# Patient Record
Sex: Female | Born: 1935 | Race: White | Hispanic: No | Marital: Married | Smoking: Former smoker
Health system: Southern US, Community
[De-identification: ages and names within clinical notes are randomized; demographics above are authoritative.]

## PROBLEM LIST (undated history)

## (undated) DIAGNOSIS — M51369 Other intervertebral disc degeneration, lumbar region without mention of lumbar back pain or lower extremity pain: Secondary | ICD-10-CM

## (undated) DIAGNOSIS — I10 Essential (primary) hypertension: Secondary | ICD-10-CM

## (undated) DIAGNOSIS — K760 Fatty (change of) liver, not elsewhere classified: Secondary | ICD-10-CM

## (undated) DIAGNOSIS — E785 Hyperlipidemia, unspecified: Secondary | ICD-10-CM

## (undated) DIAGNOSIS — M5136 Other intervertebral disc degeneration, lumbar region: Secondary | ICD-10-CM

## (undated) DIAGNOSIS — E119 Type 2 diabetes mellitus without complications: Secondary | ICD-10-CM

## (undated) DIAGNOSIS — E559 Vitamin D deficiency, unspecified: Secondary | ICD-10-CM

## (undated) DIAGNOSIS — N6019 Diffuse cystic mastopathy of unspecified breast: Secondary | ICD-10-CM

## (undated) HISTORY — DX: Other intervertebral disc degeneration, lumbar region without mention of lumbar back pain or lower extremity pain: M51.369

## (undated) HISTORY — PX: MASTECTOMY: SHX3

## (undated) HISTORY — DX: Fatty (change of) liver, not elsewhere classified: K76.0

## (undated) HISTORY — DX: Essential (primary) hypertension: I10

## (undated) HISTORY — DX: Vitamin D deficiency, unspecified: E55.9

## (undated) HISTORY — PX: KNEE ARTHROSCOPY: SHX127

## (undated) HISTORY — DX: Hyperlipidemia, unspecified: E78.5

## (undated) HISTORY — PX: APPENDECTOMY: SHX54

## (undated) HISTORY — DX: Diffuse cystic mastopathy of unspecified breast: N60.19

## (undated) HISTORY — DX: Other intervertebral disc degeneration, lumbar region: M51.36

## (undated) HISTORY — DX: Type 2 diabetes mellitus without complications: E11.9

---

## 1983-07-16 HISTORY — PX: MASTECTOMY: SHX3

## 1997-12-29 ENCOUNTER — Inpatient Hospital Stay (HOSPITAL_COMMUNITY): Admission: RE | Admit: 1997-12-29 | Discharge: 1997-12-30 | Payer: Self-pay | Admitting: Neurosurgery

## 1999-05-29 ENCOUNTER — Other Ambulatory Visit: Admission: RE | Admit: 1999-05-29 | Discharge: 1999-05-29 | Payer: Self-pay | Admitting: Obstetrics and Gynecology

## 2000-11-11 ENCOUNTER — Encounter: Payer: Self-pay | Admitting: Obstetrics and Gynecology

## 2000-11-13 ENCOUNTER — Ambulatory Visit (HOSPITAL_COMMUNITY): Admission: RE | Admit: 2000-11-13 | Discharge: 2000-11-13 | Payer: Self-pay | Admitting: Obstetrics and Gynecology

## 2001-09-30 ENCOUNTER — Inpatient Hospital Stay (HOSPITAL_COMMUNITY): Admission: EM | Admit: 2001-09-30 | Discharge: 2001-10-01 | Payer: Self-pay | Admitting: Emergency Medicine

## 2001-09-30 ENCOUNTER — Encounter: Payer: Self-pay | Admitting: Emergency Medicine

## 2002-02-02 ENCOUNTER — Encounter: Payer: Self-pay | Admitting: Internal Medicine

## 2002-02-02 ENCOUNTER — Encounter: Admission: RE | Admit: 2002-02-02 | Discharge: 2002-02-02 | Payer: Self-pay | Admitting: Internal Medicine

## 2003-09-08 ENCOUNTER — Ambulatory Visit (HOSPITAL_COMMUNITY): Admission: RE | Admit: 2003-09-08 | Discharge: 2003-09-08 | Payer: Self-pay | Admitting: Internal Medicine

## 2005-01-31 ENCOUNTER — Emergency Department (HOSPITAL_COMMUNITY): Admission: EM | Admit: 2005-01-31 | Discharge: 2005-02-01 | Payer: Self-pay | Admitting: Emergency Medicine

## 2005-03-14 ENCOUNTER — Ambulatory Visit (HOSPITAL_COMMUNITY): Admission: RE | Admit: 2005-03-14 | Discharge: 2005-03-15 | Payer: Self-pay | Admitting: Orthopaedic Surgery

## 2011-01-09 ENCOUNTER — Ambulatory Visit (HOSPITAL_COMMUNITY)
Admission: RE | Admit: 2011-01-09 | Discharge: 2011-01-09 | Disposition: A | Discharge status: Home / Self Care | Payer: Medicare Other | Source: Ambulatory Visit | Attending: Internal Medicine | Admitting: Internal Medicine

## 2011-01-09 ENCOUNTER — Other Ambulatory Visit (HOSPITAL_COMMUNITY): Payer: Self-pay | Admitting: Internal Medicine

## 2011-01-09 ENCOUNTER — Other Ambulatory Visit: Payer: Self-pay | Admitting: Internal Medicine

## 2011-01-09 DIAGNOSIS — W19XXXA Unspecified fall, initial encounter: Secondary | ICD-10-CM

## 2011-01-09 DIAGNOSIS — M25476 Effusion, unspecified foot: Secondary | ICD-10-CM | POA: Insufficient documentation

## 2011-01-09 DIAGNOSIS — M545 Low back pain: Secondary | ICD-10-CM

## 2011-01-09 DIAGNOSIS — M25473 Effusion, unspecified ankle: Secondary | ICD-10-CM | POA: Insufficient documentation

## 2011-01-09 DIAGNOSIS — M25579 Pain in unspecified ankle and joints of unspecified foot: Secondary | ICD-10-CM | POA: Insufficient documentation

## 2011-01-10 ENCOUNTER — Other Ambulatory Visit: Payer: Self-pay | Admitting: Internal Medicine

## 2011-01-10 ENCOUNTER — Ambulatory Visit
Admission: RE | Admit: 2011-01-10 | Discharge: 2011-01-10 | Disposition: A | Discharge status: Home / Self Care | Payer: Medicare Other | Source: Ambulatory Visit | Attending: Internal Medicine | Admitting: Internal Medicine

## 2011-01-10 DIAGNOSIS — M545 Low back pain: Secondary | ICD-10-CM

## 2011-01-10 MED ORDER — GADOBENATE DIMEGLUMINE 529 MG/ML IV SOLN: 15.0000 mL | Freq: Once | INTRAVENOUS | Status: AC | PRN

## 2011-01-17 ENCOUNTER — Encounter (HOSPITAL_COMMUNITY)
Admission: RE | Admit: 2011-01-17 | Discharge: 2011-01-17 | Disposition: A | Discharge status: Home / Self Care | Payer: Medicare Other | Source: Ambulatory Visit | Attending: Orthopaedic Surgery | Admitting: Orthopaedic Surgery

## 2011-01-17 ENCOUNTER — Other Ambulatory Visit (HOSPITAL_COMMUNITY): Payer: Self-pay | Admitting: Orthopaedic Surgery

## 2011-01-17 ENCOUNTER — Ambulatory Visit (HOSPITAL_COMMUNITY)
Admission: RE | Admit: 2011-01-17 | Discharge: 2011-01-17 | Disposition: A | Discharge status: Home / Self Care | Payer: Medicare Other | Source: Ambulatory Visit | Attending: Orthopaedic Surgery | Admitting: Orthopaedic Surgery

## 2011-01-17 DIAGNOSIS — Z01811 Encounter for preprocedural respiratory examination: Secondary | ICD-10-CM

## 2011-01-17 DIAGNOSIS — M4807 Spinal stenosis, lumbosacral region: Secondary | ICD-10-CM

## 2011-01-17 DIAGNOSIS — M5126 Other intervertebral disc displacement, lumbar region: Secondary | ICD-10-CM

## 2011-01-17 DIAGNOSIS — Z01812 Encounter for preprocedural laboratory examination: Secondary | ICD-10-CM | POA: Insufficient documentation

## 2011-01-17 DIAGNOSIS — Z01818 Encounter for other preprocedural examination: Secondary | ICD-10-CM | POA: Insufficient documentation

## 2011-01-17 DIAGNOSIS — Z01810 Encounter for preprocedural cardiovascular examination: Secondary | ICD-10-CM | POA: Insufficient documentation

## 2011-01-17 LAB — URINALYSIS, ROUTINE W REFLEX MICROSCOPIC
Bilirubin Urine: NEGATIVE
Glucose, UA: NEGATIVE mg/dL
Hgb urine dipstick: NEGATIVE
Ketones, ur: NEGATIVE mg/dL
Nitrite: NEGATIVE
Protein, ur: NEGATIVE mg/dL
Specific Gravity, Urine: 1.017 (ref 1.005–1.030)
Urobilinogen, UA: 0.2 mg/dL (ref 0.0–1.0)

## 2011-01-17 LAB — COMPREHENSIVE METABOLIC PANEL
ALT: 43 U/L — ABNORMAL HIGH (ref 0–35)
AST: 27 U/L (ref 0–37)
Albumin: 4.1 g/dL (ref 3.5–5.2)
Alkaline Phosphatase: 47 U/L (ref 39–117)
Chloride: 97 mEq/L (ref 96–112)
Creatinine, Ser: 0.7 mg/dL (ref 0.50–1.10)
GFR calc Af Amer: >60 mL/min (ref >60)
GFR calc non Af Amer: >60 mL/min (ref >60)
Glucose, Bld: 100 mg/dL — ABNORMAL HIGH (ref 70–99)
Potassium: 4.2 mEq/L (ref 3.5–5.1)
Sodium: 136 mEq/L (ref 135–145)

## 2011-01-17 LAB — CBC
MCH: 32.4 pg (ref 26.0–34.0)
MCHC: 35.5 g/dL (ref 30.0–36.0)
WBC: 8 10*3/uL (ref 4.0–10.5)

## 2011-01-17 LAB — APTT: aPTT: 26 seconds (ref 24–37)

## 2011-01-17 LAB — SURGICAL PCR SCREEN: Staphylococcus aureus: NEGATIVE

## 2011-01-21 ENCOUNTER — Ambulatory Visit (HOSPITAL_COMMUNITY)
Admission: RE | Admit: 2011-01-21 | Discharge: 2011-01-21 | Disposition: A | Discharge status: Home / Self Care | Payer: Medicare Other | Source: Ambulatory Visit | Attending: Orthopaedic Surgery | Admitting: Orthopaedic Surgery

## 2011-01-21 ENCOUNTER — Inpatient Hospital Stay (HOSPITAL_COMMUNITY)
Admission: RE | Admit: 2011-01-21 | Discharge: 2011-01-22 | DRG: 491 | Disposition: A | Discharge status: Home / Self Care | Payer: Medicare Other | Source: Ambulatory Visit | Attending: Orthopaedic Surgery | Admitting: Orthopaedic Surgery

## 2011-01-21 ENCOUNTER — Other Ambulatory Visit (HOSPITAL_COMMUNITY): Payer: Self-pay | Admitting: Orthopaedic Surgery

## 2011-01-21 DIAGNOSIS — Z7982 Long term (current) use of aspirin: Secondary | ICD-10-CM

## 2011-01-21 DIAGNOSIS — I10 Essential (primary) hypertension: Secondary | ICD-10-CM | POA: Diagnosis present

## 2011-01-21 DIAGNOSIS — Z87891 Personal history of nicotine dependence: Secondary | ICD-10-CM

## 2011-01-21 DIAGNOSIS — M5126 Other intervertebral disc displacement, lumbar region: Principal | ICD-10-CM | POA: Diagnosis present

## 2011-01-21 DIAGNOSIS — IMO0001 Reserved for inherently not codable concepts without codable children: Secondary | ICD-10-CM

## 2011-02-04 NOTE — Op Note (Addendum)
Debra Collins, Debra Collins                ACCOUNT NO.:  [id number]  MEDICAL RECORD NO.:  [id number]  LOCATION:  XRAY                         FACILITY:  [facility]  PHYSICIAN:  Mark C. Ophelia Charter, M.D.    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  PROCEDURE:  Left L2-3 microdiskectomy and removal of free fragment.  SURGEON:  Mark C. Ophelia Charter, MD  ASSISTANT:  Wende Neighbors, PA-C  ANESTHESIA:  GOT.  ESTIMATED BLOOD LOSS:  Minimal.  This 75 year old female who had previous surgery by Dr. Sharolyn Douglas in 2006.  At that time, the patient had severe stenosis at L3-4 with giving way falling and left lower extremity radiculopathy.  Lateral decompression was performed and operative note from March 14, 2005, described calcified disk which was scarred down adherent to the dura and foraminotomy was performed.  She improved postop and a few months ago started having back pain, left lower extremity pain again and in the last few weeks, it has been severe and she has fallen multiple times and used a cane and reported history of a sharp sudden back pain with the development of radicular pain associated with quad weakness.  MRI scan revealed L2-3 HNP with caudally migrated fragment and evidence of previous scar tissue foraminotomy.  Plan at that time was microdiskectomy 2-3 and removal of free fragment and a foraminotomy at 3- 4 as needed.  PROCEDURE IN DETAIL:  After induction of general anesthesia orotracheal intubation, preoperative antibiotics, the patient placed on chest roll, careful padding and positioning.  Back was prepped with DuraPrep orotracheally and patient was used.  Area  squared with towels.  The patient had incision which was about 4-5 mm off of the midline on the left at the 3-4 level.  Localization with spinal needles was initially placed, and cross-table lateral x-ray was used for verification.  Based on this decision was made and second film was taken with localization at the 2-3 level appropriately.  Initial first x-ray top of spinal needle which was more cephalad was rectally over the midportion of the L3 vertebral body which was the appropriate level for the migrated fragment.  After time-out procedure, incision was made extending the old incision proximally.  Subperiosteal dissection on the lamina at the 2-3 level was performed and laminotomy was performed on the L2 lamina thinning it down with a bur and then using the operative microscope, microdiskectomy was performed.  Maud Deed, PA, was assistant throughout the entire case and medically necessary for the entire procedure.  There was copious multiple epidural veins which were coagulated.  The disk was soft, bulging.  No definite rent was noted and once the vessels were coagulated, thrombin Gelfoam was used with patties above and below.  Annulus was incised and passes were made with first micropituitary Epstein curette and also regular straight pituitary removing moderate amount of plump degenerative disk material.  Continued following this down distally,  with care taken for optic nerve root fragments of disk were encountered and suctioned out, teased with ball- tip nerve hook.  Once the fragments had been removed, continued falling this down to make sure there is no remaining piece of disk and this level of the dura was adherent.  Bleb was noted dorsally and Penfield #4 was placed at the base and x-ray taken to confirm that dissection was caudal enough to completely remove the extent of the free fragment by the recent MRI scan.  This was confirmed by x-ray with a #4 Penfield at the  inferior third of the L3 vertebral body.  Fragment was laterally in the gutter and hockey stick was passed to 180-degree anterior sweep. With the adherence of the dura in the past operative note describing calcified disk with the dense adherence, it was felt that no further decompression was required at the 3-4 level since patient had a fresh free fragment which had been removed with compression of the nerve root. With the fragment removed, nerve root was loose.  Wound was irrigated. The patient was carefully inspected with microscope and Valsalva to 30 cm of pressure showed no CSF leak.  Piece of muscle was crushed and dropped over the position of the bleb at the inferior aspect and with palpation, it was apparent that there was stuck at this level as described in the 2006 operative note.  The [facility] retractor was then removed.  The epidural veins did not show significant bleeding. Operative field was dry and reapproximation of fascia with 0 Vicryl followed by 2-0 Vicryl, 4-0 Vicryl subcuticular, and Dermabond.  Postop dressing was applied.  The patient tolerated procedure well, was transferred to recovery room in stable condition.  Instrument count and needle count was correct.     Mark C. Ophelia Charter, M.D.   ______________________________ Veverly Fells. Ophelia Charter, M.D.    MCY/MEDQ  D:  01/21/2011  T:  01/22/2011  Job:  [id number]  Electronically Signed by Annell Greening M.D. on 02/04/2011 04:09:02 PM

## 2011-07-24 DIAGNOSIS — H40059 Ocular hypertension, unspecified eye: Secondary | ICD-10-CM | POA: Diagnosis not present

## 2011-08-08 DIAGNOSIS — M5126 Other intervertebral disc displacement, lumbar region: Secondary | ICD-10-CM | POA: Diagnosis not present

## 2011-08-14 DIAGNOSIS — M5126 Other intervertebral disc displacement, lumbar region: Secondary | ICD-10-CM | POA: Diagnosis not present

## 2011-09-05 DIAGNOSIS — M5126 Other intervertebral disc displacement, lumbar region: Secondary | ICD-10-CM | POA: Diagnosis not present

## 2011-09-18 DIAGNOSIS — M2240 Chondromalacia patellae, unspecified knee: Secondary | ICD-10-CM | POA: Diagnosis not present

## 2011-09-18 DIAGNOSIS — S83289A Other tear of lateral meniscus, current injury, unspecified knee, initial encounter: Secondary | ICD-10-CM | POA: Diagnosis not present

## 2011-10-03 DIAGNOSIS — IMO0002 Reserved for concepts with insufficient information to code with codable children: Secondary | ICD-10-CM | POA: Diagnosis not present

## 2011-10-03 DIAGNOSIS — M12269 Villonodular synovitis (pigmented), unspecified knee: Secondary | ICD-10-CM | POA: Diagnosis not present

## 2011-10-03 DIAGNOSIS — X58XXXA Exposure to other specified factors, initial encounter: Secondary | ICD-10-CM | POA: Diagnosis not present

## 2011-10-03 DIAGNOSIS — M2390 Unspecified internal derangement of unspecified knee: Secondary | ICD-10-CM | POA: Diagnosis not present

## 2011-10-03 DIAGNOSIS — M2240 Chondromalacia patellae, unspecified knee: Secondary | ICD-10-CM | POA: Diagnosis not present

## 2011-10-03 DIAGNOSIS — M6580 Other synovitis and tenosynovitis, unspecified site: Secondary | ICD-10-CM | POA: Diagnosis not present

## 2011-10-03 DIAGNOSIS — Y929 Unspecified place or not applicable: Secondary | ICD-10-CM | POA: Diagnosis not present

## 2011-10-03 DIAGNOSIS — S83289A Other tear of lateral meniscus, current injury, unspecified knee, initial encounter: Secondary | ICD-10-CM | POA: Diagnosis not present

## 2011-10-03 DIAGNOSIS — M9420 Chondromalacia, unspecified site: Secondary | ICD-10-CM | POA: Diagnosis not present

## 2011-10-08 DIAGNOSIS — I10 Essential (primary) hypertension: Secondary | ICD-10-CM | POA: Diagnosis not present

## 2011-10-08 DIAGNOSIS — R7309 Other abnormal glucose: Secondary | ICD-10-CM | POA: Diagnosis not present

## 2011-10-08 DIAGNOSIS — E782 Mixed hyperlipidemia: Secondary | ICD-10-CM | POA: Diagnosis not present

## 2011-10-10 DIAGNOSIS — S83289A Other tear of lateral meniscus, current injury, unspecified knee, initial encounter: Secondary | ICD-10-CM | POA: Diagnosis not present

## 2011-10-15 DIAGNOSIS — S83289A Other tear of lateral meniscus, current injury, unspecified knee, initial encounter: Secondary | ICD-10-CM | POA: Diagnosis not present

## 2011-10-17 DIAGNOSIS — S83289A Other tear of lateral meniscus, current injury, unspecified knee, initial encounter: Secondary | ICD-10-CM | POA: Diagnosis not present

## 2011-10-22 DIAGNOSIS — S83289A Other tear of lateral meniscus, current injury, unspecified knee, initial encounter: Secondary | ICD-10-CM | POA: Diagnosis not present

## 2011-10-24 DIAGNOSIS — M2390 Unspecified internal derangement of unspecified knee: Secondary | ICD-10-CM | POA: Diagnosis not present

## 2011-10-29 DIAGNOSIS — M2390 Unspecified internal derangement of unspecified knee: Secondary | ICD-10-CM | POA: Diagnosis not present

## 2011-10-31 DIAGNOSIS — M2390 Unspecified internal derangement of unspecified knee: Secondary | ICD-10-CM | POA: Diagnosis not present

## 2011-11-04 DIAGNOSIS — M2390 Unspecified internal derangement of unspecified knee: Secondary | ICD-10-CM | POA: Diagnosis not present

## 2012-01-09 DIAGNOSIS — E559 Vitamin D deficiency, unspecified: Secondary | ICD-10-CM | POA: Diagnosis not present

## 2012-01-09 DIAGNOSIS — Z79899 Other long term (current) drug therapy: Secondary | ICD-10-CM | POA: Diagnosis not present

## 2012-01-09 DIAGNOSIS — R7309 Other abnormal glucose: Secondary | ICD-10-CM | POA: Diagnosis not present

## 2012-01-09 DIAGNOSIS — I10 Essential (primary) hypertension: Secondary | ICD-10-CM | POA: Diagnosis not present

## 2012-01-09 DIAGNOSIS — E782 Mixed hyperlipidemia: Secondary | ICD-10-CM | POA: Diagnosis not present

## 2012-04-14 DIAGNOSIS — E782 Mixed hyperlipidemia: Secondary | ICD-10-CM | POA: Diagnosis not present

## 2012-04-14 DIAGNOSIS — Z23 Encounter for immunization: Secondary | ICD-10-CM | POA: Diagnosis not present

## 2012-04-14 DIAGNOSIS — D649 Anemia, unspecified: Secondary | ICD-10-CM | POA: Diagnosis not present

## 2012-04-14 DIAGNOSIS — I10 Essential (primary) hypertension: Secondary | ICD-10-CM | POA: Diagnosis not present

## 2012-04-14 DIAGNOSIS — R5381 Other malaise: Secondary | ICD-10-CM | POA: Diagnosis not present

## 2012-04-14 DIAGNOSIS — R7309 Other abnormal glucose: Secondary | ICD-10-CM | POA: Diagnosis not present

## 2012-04-14 DIAGNOSIS — E538 Deficiency of other specified B group vitamins: Secondary | ICD-10-CM | POA: Diagnosis not present

## 2012-06-26 DIAGNOSIS — J029 Acute pharyngitis, unspecified: Secondary | ICD-10-CM | POA: Diagnosis not present

## 2012-07-20 DIAGNOSIS — R7309 Other abnormal glucose: Secondary | ICD-10-CM | POA: Diagnosis not present

## 2012-07-20 DIAGNOSIS — Z79899 Other long term (current) drug therapy: Secondary | ICD-10-CM | POA: Diagnosis not present

## 2012-07-20 DIAGNOSIS — I10 Essential (primary) hypertension: Secondary | ICD-10-CM | POA: Diagnosis not present

## 2012-07-20 DIAGNOSIS — E559 Vitamin D deficiency, unspecified: Secondary | ICD-10-CM | POA: Diagnosis not present

## 2012-07-20 DIAGNOSIS — Z1212 Encounter for screening for malignant neoplasm of rectum: Secondary | ICD-10-CM | POA: Diagnosis not present

## 2012-07-20 DIAGNOSIS — E782 Mixed hyperlipidemia: Secondary | ICD-10-CM | POA: Diagnosis not present

## 2012-07-28 DIAGNOSIS — H40059 Ocular hypertension, unspecified eye: Secondary | ICD-10-CM | POA: Diagnosis not present

## 2012-10-27 DIAGNOSIS — E538 Deficiency of other specified B group vitamins: Secondary | ICD-10-CM | POA: Diagnosis not present

## 2012-10-27 DIAGNOSIS — R5383 Other fatigue: Secondary | ICD-10-CM | POA: Diagnosis not present

## 2012-10-27 DIAGNOSIS — Z79899 Other long term (current) drug therapy: Secondary | ICD-10-CM | POA: Diagnosis not present

## 2012-10-27 DIAGNOSIS — I10 Essential (primary) hypertension: Secondary | ICD-10-CM | POA: Diagnosis not present

## 2012-10-27 DIAGNOSIS — R5381 Other malaise: Secondary | ICD-10-CM | POA: Diagnosis not present

## 2012-10-27 DIAGNOSIS — R7309 Other abnormal glucose: Secondary | ICD-10-CM | POA: Diagnosis not present

## 2012-10-27 DIAGNOSIS — E782 Mixed hyperlipidemia: Secondary | ICD-10-CM | POA: Diagnosis not present

## 2012-10-27 DIAGNOSIS — D649 Anemia, unspecified: Secondary | ICD-10-CM | POA: Diagnosis not present

## 2012-10-29 ENCOUNTER — Other Ambulatory Visit: Payer: Self-pay | Admitting: Internal Medicine

## 2012-10-30 ENCOUNTER — Ambulatory Visit
Admission: RE | Admit: 2012-10-30 | Discharge: 2012-10-30 | Disposition: A | Discharge status: Home / Self Care | Payer: Medicare Other | Source: Ambulatory Visit | Attending: Internal Medicine | Admitting: Internal Medicine

## 2012-10-30 DIAGNOSIS — R7989 Other specified abnormal findings of blood chemistry: Secondary | ICD-10-CM | POA: Diagnosis not present

## 2012-11-25 DIAGNOSIS — B029 Zoster without complications: Secondary | ICD-10-CM | POA: Diagnosis not present

## 2013-01-19 DIAGNOSIS — R7309 Other abnormal glucose: Secondary | ICD-10-CM | POA: Diagnosis not present

## 2013-01-19 DIAGNOSIS — E782 Mixed hyperlipidemia: Secondary | ICD-10-CM | POA: Diagnosis not present

## 2013-01-19 DIAGNOSIS — E559 Vitamin D deficiency, unspecified: Secondary | ICD-10-CM | POA: Diagnosis not present

## 2013-01-19 DIAGNOSIS — I10 Essential (primary) hypertension: Secondary | ICD-10-CM | POA: Diagnosis not present

## 2013-01-19 DIAGNOSIS — Z79899 Other long term (current) drug therapy: Secondary | ICD-10-CM | POA: Diagnosis not present

## 2013-03-26 DIAGNOSIS — M1710 Unilateral primary osteoarthritis, unspecified knee: Secondary | ICD-10-CM | POA: Diagnosis not present

## 2013-04-01 DIAGNOSIS — M76899 Other specified enthesopathies of unspecified lower limb, excluding foot: Secondary | ICD-10-CM | POA: Diagnosis not present

## 2013-04-05 DIAGNOSIS — M76899 Other specified enthesopathies of unspecified lower limb, excluding foot: Secondary | ICD-10-CM | POA: Diagnosis not present

## 2013-04-13 DIAGNOSIS — M76899 Other specified enthesopathies of unspecified lower limb, excluding foot: Secondary | ICD-10-CM | POA: Diagnosis not present

## 2013-04-15 DIAGNOSIS — M76899 Other specified enthesopathies of unspecified lower limb, excluding foot: Secondary | ICD-10-CM | POA: Diagnosis not present

## 2013-04-19 DIAGNOSIS — M76899 Other specified enthesopathies of unspecified lower limb, excluding foot: Secondary | ICD-10-CM | POA: Diagnosis not present

## 2013-04-21 DIAGNOSIS — R252 Cramp and spasm: Secondary | ICD-10-CM | POA: Diagnosis not present

## 2013-04-21 DIAGNOSIS — E782 Mixed hyperlipidemia: Secondary | ICD-10-CM | POA: Diagnosis not present

## 2013-04-21 DIAGNOSIS — E559 Vitamin D deficiency, unspecified: Secondary | ICD-10-CM | POA: Diagnosis not present

## 2013-04-21 DIAGNOSIS — Z79899 Other long term (current) drug therapy: Secondary | ICD-10-CM | POA: Diagnosis not present

## 2013-04-21 DIAGNOSIS — E538 Deficiency of other specified B group vitamins: Secondary | ICD-10-CM | POA: Diagnosis not present

## 2013-04-21 DIAGNOSIS — E119 Type 2 diabetes mellitus without complications: Secondary | ICD-10-CM | POA: Diagnosis not present

## 2013-04-21 DIAGNOSIS — I10 Essential (primary) hypertension: Secondary | ICD-10-CM | POA: Diagnosis not present

## 2013-04-21 DIAGNOSIS — D649 Anemia, unspecified: Secondary | ICD-10-CM | POA: Diagnosis not present

## 2013-04-22 DIAGNOSIS — M76899 Other specified enthesopathies of unspecified lower limb, excluding foot: Secondary | ICD-10-CM | POA: Diagnosis not present

## 2013-05-04 DIAGNOSIS — Z23 Encounter for immunization: Secondary | ICD-10-CM | POA: Diagnosis not present

## 2013-06-08 ENCOUNTER — Telehealth: Payer: Self-pay | Admitting: Internal Medicine

## 2013-06-08 NOTE — Telephone Encounter (Signed)
Pt need [place] to call in a 90 days=to mail order=for her test strips=she said the  company she was using no longer has them. The mail order is [facility] 90 days supply If any problems call pt at [phone]

## 2013-06-08 NOTE — Telephone Encounter (Signed)
Wille Celeste - need to know which type of monitor and strips she is on   ie not just the monitor brand but also the style or model for the strips. -sorry

## 2013-06-09 ENCOUNTER — Other Ambulatory Visit: Payer: Self-pay | Admitting: Internal Medicine

## 2013-06-09 DIAGNOSIS — E119 Type 2 diabetes mellitus without complications: Secondary | ICD-10-CM

## 2013-06-09 MED ORDER — GLUCOSE BLOOD VI STRP: ORAL_STRIP | Status: DC

## 2013-06-09 NOTE — Telephone Encounter (Signed)
Dr Madison Hickman called pt back today and she is using the free style lite blood sugar monitor.  Please send rx to the mail order [facility] for the test strips. 90 days supply

## 2013-07-01 ENCOUNTER — Other Ambulatory Visit: Payer: Self-pay | Admitting: Internal Medicine

## 2013-07-01 DIAGNOSIS — I10 Essential (primary) hypertension: Secondary | ICD-10-CM

## 2013-07-01 DIAGNOSIS — E119 Type 2 diabetes mellitus without complications: Secondary | ICD-10-CM

## 2013-07-01 MED ORDER — METFORMIN HCL ER (MOD) 500 MG PO TB24: 1000.0000 mg | ORAL_TABLET | Freq: Two times a day (BID) | ORAL | Status: DC

## 2013-07-01 MED ORDER — ATENOLOL 100 MG PO TABS: 100.0000 mg | ORAL_TABLET | Freq: Every day | ORAL | Status: DC

## 2013-07-21 DIAGNOSIS — E119 Type 2 diabetes mellitus without complications: Secondary | ICD-10-CM | POA: Diagnosis not present

## 2013-07-22 ENCOUNTER — Encounter: Payer: Self-pay | Admitting: Internal Medicine

## 2013-07-25 ENCOUNTER — Encounter: Payer: Self-pay | Admitting: Internal Medicine

## 2013-07-25 DIAGNOSIS — E559 Vitamin D deficiency, unspecified: Secondary | ICD-10-CM | POA: Insufficient documentation

## 2013-07-25 DIAGNOSIS — E782 Mixed hyperlipidemia: Secondary | ICD-10-CM

## 2013-07-25 DIAGNOSIS — I10 Essential (primary) hypertension: Secondary | ICD-10-CM | POA: Insufficient documentation

## 2013-07-25 DIAGNOSIS — E785 Hyperlipidemia, unspecified: Secondary | ICD-10-CM | POA: Insufficient documentation

## 2013-07-25 NOTE — Patient Instructions (Signed)

## 2013-07-25 NOTE — Progress Notes (Signed)
Patient ID: Debra Collins, female   DOB: 1936/01/09, 78 y.o.   MRN: [id number]  Annual Screening Comprehensive Examination  This very nice 78 y.o. MWF presents for complete physical.  Patient has been followed for HTN, T2NIDDM, Hyperlipidemia, and Vitamin D Deficiency.    HTN predates since 64. Patient's BP has been controlled at home. Today's BP: 124/80 mmHg. Patient denies any cardiac symptoms as chest pain, palpitations, shortness of breath, or ankle swelling. She does report occasional lightheadedness related to postural changes    Patient's hyperlipidemia is controlled with diet and medications. Patient denies myalgias or other medication SE's. Last cholesterol last visit was 141, triglycerides 114, HDL 41 and LDL 77 in Oct - all at goal.     Patient has T2 Diabetes since2009 with last A1c 5.7% in Oct 2014. She reports FBG's range 100-120 mg%. She denies reactive hypoglycemic symptoms, visual blurring, diabetic polys, or paresthesias.    Finally, patient has history of Vitamin D Deficiency 42 in 2009 with last vitamin D 56 in Oct 2014.    Patient also has Hx/o DDD and spinal stenosis and L3L4 Disk surg in 12/2010 . Since surg she has had ongiong pains of the left medial thigh which awaken her from sleep. She saw Dr Nelva Bush and he referred her to Dr Theda Sers and she underwent Lt Knee Arthroscopy in 09/2010.       Medication List       This list is accurate as of: 07/26/13  1:58 PM.  Always use your most recent med list.               aspirin 81 MG tablet  Take 81 mg by mouth daily.     atenolol 100 MG tablet  Commonly known as:  TENORMIN  Take 1 tablet (100 mg total) by mouth daily.     atorvastatin 80 MG tablet  Commonly known as:  LIPITOR  Take 80 mg by mouth daily. Takes 1/2 tab daily     CALCITRATE PO  Take 500 mg by mouth daily.     FISH OIL PO  Take 360 mg by mouth daily.     FLAXSEED OIL PO  Take 1,400 mg by mouth daily.     glucose blood test strip  Check  glucose daily     lisinopril 20 MG tablet  Commonly known as:  PRINIVIL,ZESTRIL  Take 20 mg by mouth daily.     meloxicam 15 MG tablet  Commonly known as:  MOBIC     metFORMIN 500 MG 24 hr tablet  Commonly known as:  GLUCOPHAGE XR  Take 1 tablet twice daily with Breakfast and Lunch and take 2 tablets with Supper for Diabetes     MILK THISTLE PO  Take by mouth 2 (two) times daily.     multivitamin tablet  Take 1 tablet by mouth daily.     omeprazole 20 MG capsule  Commonly known as:  PRILOSEC  Take 20 mg by mouth daily.     verapamil 120 MG tablet  Commonly known as:  CALAN  Take 60 mg by mouth 2 (two) times daily. Takes 1/2 tab in AM and 1 tab in PM     vitamin B-12 1000 MCG tablet  Commonly known as:  CYANOCOBALAMIN  Take 1,000 mcg by mouth daily.     Vitamin D-3 1000 UNITS Caps  Take 2,000 Units by mouth 3 (three) times daily.        Allergies  Allergen Reactions  .  Diltiazem     Past Medical History  Diagnosis Date  . Hypertension   . Hyperlipidemia   . Vitamin D deficiency   . DDD (degenerative disc disease), lumbar   . Type II  NIDDM       Past Surgical History  Procedure Laterality Date  . Knee arthroscopy Left   . Appendectomy    . Brain surgery      Family History  Problem Relation Age of Onset  . Stroke Mother   . Heart disease Mother   . Heart disease Father   . Heart attack Father     History  Substance Use Topics  . Smoking status: Former Smoker    Quit date: 07/15/1993  . Smokeless tobacco: Not on file  . Alcohol Use: 2.5 oz/week    5 drink(s) per week    ROS Constitutional: Denies fever, chills, weight loss/gain, headaches, insomnia, fatigue, night sweats, and change in appetite. Eyes: Denies redness, blurred vision, diplopia, discharge, itchy, watery eyes.  ENT: Denies discharge, congestion, post nasal drip, epistaxis, sore throat, earache, hearing loss, dental pain, Tinnitus, Vertigo, Sinus pain, snoring.  Cardio: Denies  chest pain, palpitations, irregular heartbeat, syncope, dyspnea, diaphoresis, orthopnea, PND, claudication, edema Respiratory: denies cough, dyspnea, DOE, pleurisy, hoarseness, laryngitis, wheezing.  Gastrointestinal: Denies dysphagia, heartburn, reflux, water brash, pain, cramps, nausea, vomiting, bloating, diarrhea, constipation, hematemesis, melena, hematochezia, jaundice, hemorrhoids Genitourinary: Denies dysuria, frequency, urgency, nocturia, hesitancy, discharge, hematuria, flank pain Breast:Breast lumps, nipple discharge, bleeding.  Musculoskeletal: Denies arthralgia, myalgia, stiffness, Jt. Swelling, pain, limp, and strain/sprain. Skin: Denies puritis, rash, hives, warts, acne, eczema, changing in skin lesion Neuro: No weakness, tremor, incoordination, spasms, paresthesia, pain Psychiatric: Denies confusion, memory loss, sensory loss Endocrine: Denies change in weight, skin, hair change, nocturia, and paresthesia, diabetic polys, visual blurring, hyper / hypo glycemic episodes.  Heme/Lymph: No excessive bleeding, bruising, enlarged lymph nodes.  Filed Vitals:   07/26/13 1131  BP: 124/80  Pulse: 76  Temp: 97.7 F (36.5 C)  Resp: 16    Estimated body mass index is 26.64 kg/(m^2) as calculated from the following:   Height as of this encounter: 5' 2.75" (1.594 m).   Weight as of this encounter: 149 lb 3.2 oz (67.677 kg).  Physical Exam General Appearance: Well nourished, in no apparent distress. Eyes: PERRLA, EOMs, conjunctiva no swelling or erythema, normal fundi and vessels. Sinuses: No frontal/maxillary tenderness ENT/Mouth: EACs patent / TMs  nl. Nares clear without erythema, swelling, mucoid exudates. Oral hygiene is good. No erythema, swelling, or exudate. Tongue normal, non-obstructing. Tonsils not swollen or erythematous. Hearing normal.  Neck: Supple, thyroid normal. No bruits, nodes or JVD. Respiratory: Respiratory effort normal.  BS equal and clear bilateral without  rales, rhonci, wheezing or stridor. Cardio: Heart sounds are normal with regular rate and rhythm and no murmurs, rubs or gallops. Peripheral pulses are normal and equal bilaterally without edema. No aortic or femoral bruits. Chest: symmetric with normal excursions and percussion. Breasts: Symmetric, without lumps, nipple discharge, retractions, or fibrocystic changes.  Abdomen: Flat, soft, with bowl sounds. Nontender, no guarding, rebound, hernias, masses, or organomegaly.  Lymphatics: Non tender without lymphadenopathy.  Genitourinary:  Musculoskeletal: Full ROM all peripheral extremities, joint stability, 5/5 strength, and normal gait. Skin: Warm and dry without rashes, lesions, cyanosis, clubbing or  ecchymosis.  Neuro: Cranial nerves intact, reflexes equal bilaterally. Normal muscle tone, no cerebellar symptoms. Sensation intact.  Pysch: Awake and oriented X 3, normal affect, Insight and Judgment appropriate.   Assessment and Plan  1. Annual Screening Examination 2. Hypertension - recc decrease Lisinopril to 1 tab Qd and Verapamil 120 to 1/2 tab bid or even D/C it if the dizziness persists 3. Hyperlipidemia 4. T2 NIDDM 5. Vitamin D Deficiency 6. 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  Continue prudent diet as discussed, weight control, BP monitoring, regular exercise, and medications. Discussed med's effects and SE's. Screening labs and tests as requested with regular follow-up as recommended.

## 2013-07-26 ENCOUNTER — Ambulatory Visit (INDEPENDENT_AMBULATORY_CARE_PROVIDER_SITE_OTHER): Payer: Medicare Other | Admitting: Internal Medicine

## 2013-07-26 ENCOUNTER — Encounter: Payer: Self-pay | Admitting: Internal Medicine

## 2013-07-26 VITALS — BP 124/80 | HR 76 | Temp 97.7°F | Resp 16 | Ht 62.75 in | Wt 149.2 lb

## 2013-07-26 DIAGNOSIS — I10 Essential (primary) hypertension: Secondary | ICD-10-CM

## 2013-07-26 DIAGNOSIS — E119 Type 2 diabetes mellitus without complications: Secondary | ICD-10-CM

## 2013-07-26 DIAGNOSIS — Z0000 Encounter for general adult medical examination without abnormal findings: Secondary | ICD-10-CM | POA: Diagnosis not present

## 2013-07-26 DIAGNOSIS — E782 Mixed hyperlipidemia: Secondary | ICD-10-CM | POA: Diagnosis not present

## 2013-07-26 DIAGNOSIS — E559 Vitamin D deficiency, unspecified: Secondary | ICD-10-CM

## 2013-07-26 DIAGNOSIS — E538 Deficiency of other specified B group vitamins: Secondary | ICD-10-CM

## 2013-07-26 DIAGNOSIS — Z79899 Other long term (current) drug therapy: Secondary | ICD-10-CM

## 2013-07-26 DIAGNOSIS — Z1212 Encounter for screening for malignant neoplasm of rectum: Secondary | ICD-10-CM

## 2013-07-26 DIAGNOSIS — D649 Anemia, unspecified: Secondary | ICD-10-CM

## 2013-07-26 LAB — LIPID PANEL
Cholesterol: 179 mg/dL (ref 0–200)
HDL: 45 mg/dL (ref >39)
LDL CALC: 104 mg/dL — AB (ref 0–99)
Total CHOL/HDL Ratio: 4 Ratio
Triglycerides: 152 mg/dL — ABNORMAL HIGH (ref <150)
VLDL: 30 mg/dL (ref 0–40)

## 2013-07-26 LAB — CBC WITH DIFFERENTIAL/PLATELET
BASOS ABS: 0 10*3/uL (ref 0.0–0.1)
Basophils Relative: 0 % (ref 0–1)
Eosinophils Absolute: 0.1 10*3/uL (ref 0.0–0.7)
Eosinophils Relative: 2 % (ref 0–5)
HCT: 42.4 % (ref 36.0–46.0)
Hemoglobin: 14.3 g/dL (ref 12.0–15.0)
LYMPHS ABS: 1.8 10*3/uL (ref 0.7–4.0)
Lymphocytes Relative: 28 % (ref 12–46)
MCH: 32.4 pg (ref 26.0–34.0)
MCHC: 33.7 g/dL (ref 30.0–36.0)
MCV: 95.9 fL (ref 78.0–100.0)
Monocytes Absolute: 0.6 10*3/uL (ref 0.1–1.0)
Monocytes Relative: 8 % (ref 3–12)
NEUTROS ABS: 4.1 10*3/uL (ref 1.7–7.7)
Neutrophils Relative %: 62 % (ref 43–77)
Platelets: 223 10*3/uL (ref 150–400)
RBC: 4.42 MIL/uL (ref 3.87–5.11)
RDW: 14.1 % (ref 11.5–15.5)
WBC: 6.6 10*3/uL (ref 4.0–10.5)

## 2013-07-26 LAB — HEPATIC FUNCTION PANEL
ALBUMIN: 4.7 g/dL (ref 3.5–5.2)
ALK PHOS: 40 U/L (ref 39–117)
ALT: 26 U/L (ref 0–35)
AST: 20 U/L (ref 0–37)
BILIRUBIN TOTAL: 1 mg/dL (ref 0.3–1.2)
Bilirubin, Direct: 0.2 mg/dL (ref 0.0–0.3)
Indirect Bilirubin: 0.8 mg/dL (ref 0.0–0.9)
TOTAL PROTEIN: 6.6 g/dL (ref 6.0–8.3)

## 2013-07-26 LAB — IRON AND TIBC
%SAT: 36 % (ref 20–55)
Iron: 142 ug/dL (ref 42–145)
TIBC: 391 ug/dL (ref 250–470)
UIBC: 249 ug/dL (ref 125–400)

## 2013-07-26 LAB — BASIC METABOLIC PANEL WITH GFR
BUN: 26 mg/dL — ABNORMAL HIGH (ref 6–23)
CO2: 30 meq/L (ref 19–32)
CREATININE: 0.58 mg/dL (ref 0.50–1.10)
Calcium: 9.6 mg/dL (ref 8.4–10.5)
Chloride: 99 mEq/L (ref 96–112)
GFR, Est African American: >89 mL/min
Glucose, Bld: 89 mg/dL (ref 70–99)
POTASSIUM: 4.3 meq/L (ref 3.5–5.3)
Sodium: 137 mEq/L (ref 135–145)

## 2013-07-26 LAB — TSH: TSH: 2.2 u[IU]/mL (ref 0.350–4.500)

## 2013-07-26 LAB — HEMOGLOBIN A1C
Hgb A1c MFr Bld: 5.9 % — ABNORMAL HIGH (ref <5.7)
MEAN PLASMA GLUCOSE: 123 mg/dL — AB (ref <117)

## 2013-07-26 LAB — MAGNESIUM: MAGNESIUM: 1.7 mg/dL (ref 1.5–2.5)

## 2013-07-26 MED ORDER — METFORMIN HCL ER 500 MG PO TB24: ORAL_TABLET | ORAL | Status: DC

## 2013-07-27 LAB — URINALYSIS, MICROSCOPIC ONLY
BACTERIA UA: NONE SEEN
CASTS: NONE SEEN
CRYSTALS: NONE SEEN
SQUAMOUS EPITHELIAL / LPF: NONE SEEN

## 2013-07-27 LAB — INSULIN, FASTING: Insulin fasting, serum: 9 u[IU]/mL (ref 3–28)

## 2013-07-28 ENCOUNTER — Telehealth: Payer: Self-pay | Admitting: *Deleted

## 2013-07-28 NOTE — Telephone Encounter (Signed)
Message copied by Emelda Brothers on Wed Jul 28, 2013 10:37 AM ------      Message from: Unk Pinto      Created: Wed Jul 28, 2013 12:57 AM       A1c still elevated at 5.9% and need stricter diet and weight loss.      All else ok ------

## 2013-08-13 ENCOUNTER — Other Ambulatory Visit (INDEPENDENT_AMBULATORY_CARE_PROVIDER_SITE_OTHER): Payer: Medicare Other | Admitting: *Deleted

## 2013-08-13 DIAGNOSIS — Z1212 Encounter for screening for malignant neoplasm of rectum: Secondary | ICD-10-CM

## 2013-08-13 LAB — POC HEMOCCULT BLD/STL (HOME/3-CARD/SCREEN)
Card #3 Fecal Occult Blood, POC: NEGATIVE
FECAL OCCULT BLD: NEGATIVE
Fecal Occult Blood, POC: NEGATIVE

## 2013-10-11 ENCOUNTER — Ambulatory Visit (INDEPENDENT_AMBULATORY_CARE_PROVIDER_SITE_OTHER): Payer: Medicare Other | Admitting: Physician Assistant

## 2013-10-11 ENCOUNTER — Encounter: Payer: Self-pay | Admitting: Physician Assistant

## 2013-10-11 VITALS — BP 110/62 | HR 72 | Temp 97.7°F | Resp 16 | Wt 157.0 lb

## 2013-10-11 DIAGNOSIS — L247 Irritant contact dermatitis due to plants, except food: Secondary | ICD-10-CM

## 2013-10-11 DIAGNOSIS — L255 Unspecified contact dermatitis due to plants, except food: Secondary | ICD-10-CM | POA: Diagnosis not present

## 2013-10-11 MED ORDER — PREDNISONE 20 MG PO TABS: ORAL_TABLET | ORAL | Status: DC

## 2013-10-11 MED ORDER — DEXAMETHASONE SODIUM PHOSPHATE 100 MG/10ML IJ SOLN
10.0000 mg | Freq: Once | INTRAMUSCULAR | Status: AC
  Administered 2013-10-12: 10 mg via INTRAMUSCULAR

## 2013-10-11 NOTE — Progress Notes (Signed)
   Subjective:    Patient ID: Debra Collins, female    DOB: 1936-01-15, 78 y.o.   MRN: [id number]  HPI 78 y.o. female presents with a rash for 2-3 days. She states she was digging up flowers around a tree. She started to have bilateral rashes on just her arms, very pruritic. She has been using anti itch cream.   Review of Systems  Constitutional: Negative.   HENT: Positive for congestion and sore throat. Negative for dental problem, drooling, postnasal drip, rhinorrhea, sinus pressure, sneezing, tinnitus, trouble swallowing and voice change.   Respiratory: Positive for cough (dry, nonproductive). Negative for chest tightness, shortness of breath, wheezing and stridor.   Cardiovascular: Negative.   Gastrointestinal: Negative.   Genitourinary: Negative.   Musculoskeletal: Negative.   Skin: Positive for rash.  Neurological: Negative.        Objective:   Physical Exam  Constitutional: She appears well-developed and well-nourished.  HENT:  Head: Normocephalic and atraumatic.  Eyes: Conjunctivae are normal. Pupils are equal, round, and reactive to light.  Neck: Normal range of motion. Neck supple.  Cardiovascular: Normal rate and regular rhythm.   Pulmonary/Chest: Effort normal and breath sounds normal.  Abdominal: Soft. Bowel sounds are normal.  Lymphadenopathy:    She has no cervical adenopathy.  Skin: Skin is warm and dry. Rash (bilatreal arms, linear vesicles with erythema. ) noted.       Assessment & Plan:  Plant contact dermatitis-  Get on 1/2 zyrtec at night and prednisone pills.  Dexamethasone 10mg  IM

## 2013-10-11 NOTE — Patient Instructions (Signed)

## 2013-10-25 ENCOUNTER — Encounter: Payer: Self-pay | Admitting: Physician Assistant

## 2013-10-25 ENCOUNTER — Ambulatory Visit (INDEPENDENT_AMBULATORY_CARE_PROVIDER_SITE_OTHER): Payer: Medicare Other | Admitting: Physician Assistant

## 2013-10-25 VITALS — BP 120/78 | HR 60 | Temp 97.7°F | Resp 16 | Ht 63.0 in | Wt 155.0 lb

## 2013-10-25 DIAGNOSIS — E119 Type 2 diabetes mellitus without complications: Secondary | ICD-10-CM | POA: Diagnosis not present

## 2013-10-25 DIAGNOSIS — Z79899 Other long term (current) drug therapy: Secondary | ICD-10-CM | POA: Diagnosis not present

## 2013-10-25 DIAGNOSIS — Z0000 Encounter for general adult medical examination without abnormal findings: Secondary | ICD-10-CM

## 2013-10-25 DIAGNOSIS — I10 Essential (primary) hypertension: Secondary | ICD-10-CM

## 2013-10-25 DIAGNOSIS — N3000 Acute cystitis without hematuria: Secondary | ICD-10-CM | POA: Diagnosis not present

## 2013-10-25 DIAGNOSIS — Z1331 Encounter for screening for depression: Secondary | ICD-10-CM

## 2013-10-25 DIAGNOSIS — E782 Mixed hyperlipidemia: Secondary | ICD-10-CM | POA: Diagnosis not present

## 2013-10-25 DIAGNOSIS — E559 Vitamin D deficiency, unspecified: Secondary | ICD-10-CM

## 2013-10-25 DIAGNOSIS — E669 Obesity, unspecified: Secondary | ICD-10-CM

## 2013-10-25 LAB — BASIC METABOLIC PANEL WITH GFR
BUN: 22 mg/dL (ref 6–23)
CALCIUM: 9.5 mg/dL (ref 8.4–10.5)
CO2: 29 mEq/L (ref 19–32)
Chloride: 100 mEq/L (ref 96–112)
Creat: 0.61 mg/dL (ref 0.50–1.10)
GFR, EST NON AFRICAN AMERICAN: 89 mL/min
Glucose, Bld: 95 mg/dL (ref 70–99)
Potassium: 4.3 mEq/L (ref 3.5–5.3)
SODIUM: 137 meq/L (ref 135–145)

## 2013-10-25 LAB — HEPATIC FUNCTION PANEL
ALT: 30 U/L (ref 0–35)
AST: 21 U/L (ref 0–37)
Albumin: 4.6 g/dL (ref 3.5–5.2)
Alkaline Phosphatase: 34 U/L — ABNORMAL LOW (ref 39–117)
BILIRUBIN DIRECT: 0.3 mg/dL (ref 0.0–0.3)
BILIRUBIN TOTAL: 1.4 mg/dL — AB (ref 0.2–1.2)
Indirect Bilirubin: 1.1 mg/dL (ref 0.2–1.2)
Total Protein: 6.7 g/dL (ref 6.0–8.3)

## 2013-10-25 LAB — LIPID PANEL
CHOLESTEROL: 156 mg/dL (ref 0–200)
HDL: 49 mg/dL (ref >39)
LDL Cholesterol: 74 mg/dL (ref 0–99)
TRIGLYCERIDES: 164 mg/dL — AB (ref <150)
Total CHOL/HDL Ratio: 3.2 Ratio
VLDL: 33 mg/dL (ref 0–40)

## 2013-10-25 LAB — CBC WITH DIFFERENTIAL/PLATELET
BASOS ABS: 0 10*3/uL (ref 0.0–0.1)
BASOS PCT: 0 % (ref 0–1)
EOS ABS: 0.2 10*3/uL (ref 0.0–0.7)
Eosinophils Relative: 3 % (ref 0–5)
HCT: 43.4 % (ref 36.0–46.0)
Hemoglobin: 14.5 g/dL (ref 12.0–15.0)
Lymphocytes Relative: 26 % (ref 12–46)
Lymphs Abs: 1.7 10*3/uL (ref 0.7–4.0)
MCH: 31.9 pg (ref 26.0–34.0)
MCHC: 33.4 g/dL (ref 30.0–36.0)
MCV: 95.6 fL (ref 78.0–100.0)
Monocytes Absolute: 0.6 10*3/uL (ref 0.1–1.0)
Monocytes Relative: 9 % (ref 3–12)
NEUTROS PCT: 62 % (ref 43–77)
Neutro Abs: 4 10*3/uL (ref 1.7–7.7)
Platelets: 179 10*3/uL (ref 150–400)
RBC: 4.54 MIL/uL (ref 3.87–5.11)
RDW: 14 % (ref 11.5–15.5)
WBC: 6.4 10*3/uL (ref 4.0–10.5)

## 2013-10-25 LAB — HEMOGLOBIN A1C
Hgb A1c MFr Bld: 6.1 % — ABNORMAL HIGH (ref <5.7)
Mean Plasma Glucose: 128 mg/dL — ABNORMAL HIGH (ref <117)

## 2013-10-25 LAB — MAGNESIUM: Magnesium: 2 mg/dL (ref 1.5–2.5)

## 2013-10-25 LAB — TSH: TSH: 1.948 u[IU]/mL (ref 0.350–4.500)

## 2013-10-25 NOTE — Patient Instructions (Signed)
Cut the atenolol in half for the time being to see if this helps your dizziness.  I'm checking labs on you.  Dizziness Dizziness is a common problem. It is a feeling of unsteadiness or lightheadedness. You may feel like you are about to faint. Dizziness can lead to injury if you stumble or fall. A person of any age group can suffer from dizziness, but dizziness is more common in older adults. CAUSES  Dizziness can be caused by many different things, including:  Middle ear problems.  Standing for too long.  Infections.  An allergic reaction.  Aging.  An emotional response to something, such as the sight of blood.  Side effects of medicines.  Fatigue.  Problems with circulation or blood pressure.  Excess use of alcohol, medicines, or illegal drug use.  Breathing too fast (hyperventilation).  An arrhythmia or problems with your heart rhythm.  Low red blood cell count (anemia).  Pregnancy.  Vomiting, diarrhea, fever, or other illnesses that cause dehydration.  Diseases or conditions such as Parkinson's disease, high blood pressure (hypertension), diabetes, and thyroid problems.  Exposure to extreme heat. DIAGNOSIS  To find the cause of your dizziness, your caregiver may do a physical exam, lab tests, radiologic imaging scans, or an electrocardiography test (ECG).  TREATMENT  Treatment of dizziness depends on the cause of your symptoms and can vary greatly. HOME CARE INSTRUCTIONS   Drink enough fluids to keep your urine clear or pale yellow. This is especially important in very hot weather. In the elderly, it is also important in cold weather.  If your dizziness is caused by medicines, take them exactly as directed. When taking blood pressure medicines, it is especially important to get up slowly.  Rise slowly from chairs and steady yourself until you feel okay.  In the morning, first sit up on the side of the bed. When this seems okay, stand slowly while holding onto  something until you know your balance is fine.  If you need to stand in one place for a long time, be sure to move your legs often. Tighten and relax the muscles in your legs while standing.  If dizziness continues to be a problem, have someone stay with you for a day or two. Do this until you feel you are well enough to stay alone. Have the person call your caregiver if he or she notices changes in you that are concerning.  Do not drive or use heavy machinery if you feel dizzy.  Do not drink alcohol. SEEK IMMEDIATE MEDICAL CARE IF:   Your dizziness or lightheadedness gets worse.  You feel nauseous or vomit.  You develop problems with talking, walking, weakness, or using your arms, hands, or legs.  You are not thinking clearly or you have difficulty forming sentences. It may take a friend or family member to determine if your thinking is normal.  You develop chest pain, abdominal pain, shortness of breath, or sweating.  Your vision changes.  You notice any bleeding.  You have side effects from medicine that seems to be getting worse rather than better. MAKE SURE YOU:   Understand these instructions.  Will watch your condition.  Will get help right away if you are not doing well or get worse. Document Released: 12/25/2000 Document Revised: 09/23/2011 Document Reviewed: 01/18/2011 [facility] Patient Information 2014 [place], [place].  Preventative Care for Adults - Female      MAINTAIN REGULAR HEALTH EXAMS:  A routine yearly physical is a good way to check  in with your primary care provider about your health and preventive screening. It is also an opportunity to share updates about your health and any concerns you have, and receive a thorough all-over exam.   Most health insurance companies pay for at least some preventative services.  Check with your health plan for specific coverages.  WHAT PREVENTATIVE SERVICES DO WOMEN NEED?  Adult women should have their weight and blood  pressure checked regularly.   Women age 39 and older should have their cholesterol levels checked regularly.  Women should be screened for cervical cancer with a Pap smear and pelvic exam beginning at either age 49, or 3 years after they become sexually activity.    Breast cancer screening generally begins at age 59 with a mammogram and breast exam by your primary care provider.    Beginning at age 73 and continuing to age 65, women should be screened for colorectal cancer.  Certain people may need continued testing until age 42.  Updating vaccinations is part of preventative care.  Vaccinations help protect against diseases such as the flu.  Osteoporosis is a disease in which the bones lose minerals and strength as we age. Women ages 76 and over should discuss this with their caregivers, as should women after menopause who have other risk factors.  Lab tests are generally done as part of preventative care to screen for anemia and blood disorders, to screen for problems with the kidneys and liver, to screen for bladder problems, to check blood sugar, and to check your cholesterol level.  Preventative services generally include counseling about diet, exercise, avoiding tobacco, drugs, excessive alcohol consumption, and sexually transmitted infections.    GENERAL RECOMMENDATIONS FOR GOOD HEALTH:  Healthy diet:  Eat a variety of foods, including fruit, vegetables, animal or vegetable protein, such as meat, fish, chicken, and eggs, or beans, lentils, tofu, and grains, such as rice.  Drink plenty of water daily.  Decrease saturated fat in the diet, avoid lots of red meat, processed foods, sweets, fast foods, and fried foods.  Exercise:  Aerobic exercise helps maintain good heart health. At least 30-40 minutes of moderate-intensity exercise is recommended. For example, a brisk walk that increases your heart rate and breathing. This should be done on most days of the week.   Find a type of  exercise or a variety of exercises that you enjoy so that it becomes a part of your daily life.  Examples are running, walking, swimming, water aerobics, and biking.  For motivation and support, explore group exercise such as aerobic class, spin class, Zumba, Yoga,or  martial arts, etc.    Set exercise goals for yourself, such as a certain weight goal, walk or run in a race such as a 5k walk/run.  Speak to your primary care provider about exercise goals.  Disease prevention:  If you smoke or chew tobacco, find out from your caregiver how to quit. It can literally save your life, no matter how long you have been a tobacco user. If you do not use tobacco, never begin.   Maintain a healthy diet and normal weight. Increased weight leads to problems with blood pressure and diabetes.   The Body Mass Index or BMI is a way of measuring how much of your body is fat. Having a BMI above 27 increases the risk of heart disease, diabetes, hypertension, stroke and other problems related to obesity. Your caregiver can help determine your BMI and based on it develop an exercise and dietary  program to help you achieve or maintain this important measurement at a healthful level.  High blood pressure causes heart and blood vessel problems.  Persistent high blood pressure should be treated with medicine if weight loss and exercise do not work.   Fat and cholesterol leaves deposits in your arteries that can block them. This causes heart disease and vessel disease elsewhere in your body.  If your cholesterol is found to be high, or if you have heart disease or certain other medical conditions, then you may need to have your cholesterol monitored frequently and be treated with medication.   Ask if you should have a cardiac stress test if your history suggests this. A stress test is a test done on a treadmill that looks for heart disease. This test can find disease prior to there being a problem.  Menopause can be  associated with physical symptoms and risks. Hormone replacement therapy is available to decrease these. You should talk to your caregiver about whether starting or continuing to take hormones is right for you.   Osteoporosis is a disease in which the bones lose minerals and strength as we age. This can result in serious bone fractures. Risk of osteoporosis can be identified using a bone density scan. Women ages 42 and over should discuss this with their caregivers, as should women after menopause who have other risk factors. Ask your caregiver whether you should be taking a calcium supplement and Vitamin D, to reduce the rate of osteoporosis.   Avoid drinking alcohol in excess (more than two drinks per day).  Avoid use of street drugs. Do not share needles with anyone. Ask for professional help if you need assistance or instructions on stopping the use of alcohol, cigarettes, and/or drugs.  Brush your teeth twice a day with fluoride toothpaste, and floss once a day. Good oral hygiene prevents tooth decay and gum disease. The problems can be painful, unattractive, and can cause other health problems. Visit your dentist for a routine oral and dental check up and preventive care every 6-12 months.   Look at your skin regularly.  Use a mirror to look at your back. Notify your caregivers of changes in moles, especially if there are changes in shapes, colors, a size larger than a pencil eraser, an irregular border, or development of new moles.  Safety:  Use seatbelts 100% of the time, whether driving or as a passenger.  Use safety devices such as hearing protection if you work in environments with loud noise or significant background noise.  Use safety glasses when doing any work that could send debris in to the eyes.  Use a helmet if you ride a bike or motorcycle.  Use appropriate safety gear for contact sports.  Talk to your caregiver about gun safety.  Use sunscreen with a SPF (or skin protection factor)  of 15 or greater.  Lighter skinned people are at a greater risk of skin cancer. Don't forget to also wear sunglasses in order to protect your eyes from too much damaging sunlight. Damaging sunlight can accelerate cataract formation.   Practice safe sex. Use condoms. Condoms are used for birth control and to help reduce the spread of sexually transmitted infections (or STIs).  Some of the STIs are gonorrhea (the clap), chlamydia, syphilis, trichomonas, herpes, HPV (human papilloma virus) and HIV (human immunodeficiency virus) which causes AIDS. The herpes, HIV and HPV are viral illnesses that have no cure. These can result in disability, cancer and death.  Keep carbon monoxide and smoke detectors in your home functioning at all times. Change the batteries every 6 months or use a model that plugs into the wall.   Vaccinations:  Stay up to date with your tetanus shots and other required immunizations. You should have a booster for tetanus every 10 years. Be sure to get your flu shot every year, since 5%-20% of the [place] population comes down with the flu. The flu vaccine changes each year, so being vaccinated once is not enough. Get your shot in the fall, before the flu season peaks.   Other vaccines to consider:  Human Papilloma Virus or HPV causes cancer of the cervix, and other infections that can be transmitted from person to person. There is a vaccine for HPV, and females should get immunized between the ages of 84 and 39. It requires a series of 3 shots.   Pneumococcal vaccine to protect against certain types of pneumonia.  This is normally recommended for adults age 52 or older.  However, adults younger than 76 years old with certain underlying conditions such as diabetes, heart or lung disease should also receive the vaccine.  Shingles vaccine to protect against Varicella Zoster if you are older than age 61, or younger than 76 years old with certain underlying illness.  Hepatitis A vaccine to  protect against a form of infection of the liver by a virus acquired from food.  Hepatitis B vaccine to protect against a form of infection of the liver by a virus acquired from blood or body fluids, particularly if you work in health care.  If you plan to travel internationally, check with your local health department for specific vaccination recommendations.  Cancer Screening:  Breast cancer screening is essential to preventive care for women. All women age 62 and older should perform a breast self-exam every month. At age 45 and older, women should have their caregiver complete a breast exam each year. Women at ages 17 and older should have a mammogram (x-ray film) of the breasts. Your caregiver can discuss how often you need mammograms.    Cervical cancer screening includes taking a Pap smear (sample of cells examined under a microscope) from the cervix (end of the uterus). It also includes testing for HPV (Human Papilloma Virus, which can cause cervical cancer). Screening and a pelvic exam should begin at age 42, or 3 years after a woman becomes sexually active. Screening should occur every year, with a Pap smear but no HPV testing, up to age 3. After age 35, you should have a Pap smear every 3 years with HPV testing, if no HPV was found previously.   Most routine colon cancer screening begins at the age of 78. On a yearly basis, doctors may provide special easy to use take-home tests to check for hidden blood in the stool. Sigmoidoscopy or colonoscopy can detect the earliest forms of colon cancer and is life saving. These tests use a small camera at the end of a tube to directly examine the colon. Speak to your caregiver about this at age 1, when routine screening begins (and is repeated every 5 years unless early forms of pre-cancerous polyps or small growths are found).

## 2013-10-25 NOTE — Progress Notes (Signed)
Subjective:   Debra Collins is a 78 y.o. female who presents for Medicare Annual Wellness Visit and 3 month follow up on hypertension, diabetes, hyperlipidemia, vitamin D def.  Date of last medicare wellness visit is unknown.   Her blood pressure has been controlled at home, today their BP is BP: 120/78 mmHg She does not workout, she works out in her yard.  She denies chest pain, shortness of breath, dizziness.  She is on cholesterol medication and denies myalgias. Her cholesterol is at goal. The cholesterol last visit was:   Lab Results  Component Value Date   CHOL 179 07/26/2013   HDL 45 07/26/2013   LDLCALC 104* 07/26/2013   TRIG 152* 07/26/2013   CHOLHDL 4.0 07/26/2013   She has been working on diet and exercise for diabetes, and she has not been checking her sugars lately, denies paresthesia of the feet, polydipsia and polyuria. Last A1C in the office was:  Lab Results  Component Value Date   HGBA1C 5.9* 07/26/2013   Patient is on Vitamin D supplement. 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  She has felt dizzy with bending over, and states she feels off balance for the last 3 months. For example, she was planting in her yard yesterday and felt off balance, she denies any accompaniments without vision changes, SOB, CP, etc. Sits for 5-10 mins and feels better.  Continuing left leg pain after history of lower back surgery.   Names of Other Physician/Practitioners you currently use: 1. [facility]- here for primary care 2. Dr. Delman Cheadle, eye doctor, last visit 07/2013 3. Dr. Lavonna Rua, dentist, last visit q 6 months Patient Care Team: Unk Pinto, MD as PCP - General (Internal Medicine)  Medication Review Current Outpatient Prescriptions on File Prior to Visit  Medication Sig Dispense Refill  . aspirin 81 MG tablet Take 81 mg by mouth daily.      Marland Kitchen atenolol (TENORMIN)  100 MG tablet Take 1 tablet (100 mg total) by mouth daily.  90 tablet  99  . atorvastatin (LIPITOR) 80 MG tablet Take 80 mg by mouth daily. Takes 1/2 tab daily      . Calcium Citrate (CALCITRATE PO) Take 500 mg by mouth daily.      . Cholecalciferol (VITAMIN D-3) 1000 UNITS CAPS Take 2,000 Units by mouth 3 (three) times daily.       . Flaxseed, Linseed, (FLAXSEED OIL PO) Take 1,400 mg by mouth daily.      Marland Kitchen glucose blood test strip Check glucose daily  100 each  99  . lisinopril (PRINIVIL,ZESTRIL) 20 MG tablet Take 20 mg by mouth daily.      . meloxicam (MOBIC) 15 MG tablet       . metFORMIN (GLUCOPHAGE XR) 500 MG 24 hr tablet Take 1 tablet twice daily with Breakfast and Lunch and take 2 tablets with Supper for Diabetes  360 tablet  99  . MILK THISTLE PO Take by mouth 2 (two) times daily.      . Multiple Vitamin (MULTIVITAMIN) tablet Take 1 tablet by mouth daily.      . Omega-3 Fatty Acids (FISH OIL PO) Take 360 mg by mouth daily.      . predniSONE (DELTASONE) 20 MG tablet Take one pill two times daily for 3 days, take one pill daily for 4 days.  10 tablet  0  .  verapamil (CALAN) 120 MG tablet Take 60 mg by mouth 2 (two) times daily.       . vitamin B-12 (CYANOCOBALAMIN) 1000 MCG tablet Take 1,000 mcg by mouth daily.       No current facility-administered medications on file prior to visit.    Current Problems (verified) Patient Active Problem List   Diagnosis Date Noted  . Routine general medical examination at a health care facility 07/26/2013  . Unspecified essential hypertension 07/25/2013  . Mixed hyperlipidemia 07/25/2013  . T2 NIDDM 07/25/2013  . Vitamin D Deficiency 07/25/2013    Screening Tests Health Maintenance  Topic Date Due  . Mammogram  03/11/1988  . Colonoscopy  03/11/1988  . Influenza Vaccine  02/12/2014  . Tetanus/tdap  07/09/2021  . Pneumococcal Polysaccharide Vaccine Age 56 And Over  Completed  . Zostavax  Completed     Immunization History  Administered  Date(s) Administered  . H1N1 06/28/2008  . Influenza Whole 05/04/2013  . Pneumococcal Polysaccharide-23 07/10/2011  . Td 07/10/2011  . Zoster 06/28/2008    Preventative care: Last colonoscopy: 2009 Last mammogram: declines Last pap smear/pelvic exam: remote DEXA - unknown  Prior vaccinations: TD or Tdap: 2012  Influenza: 2014 Pneumococcal: 2012 Shingles/Zostavax: 2009  History reviewed: allergies, current medications, past family history, past medical history, past social history, past surgical history and problem list  Risk Factors: Osteoporosis: postmenopausal estrogen deficiency and dietary calcium and/or vitamin D deficiency History of fracture in the past year: no  Tobacco History  Substance Use Topics  . Smoking status: Former Smoker    Quit date: 07/15/1993  . Smokeless tobacco: Not on file  . Alcohol Use: 2.5 oz/week    5 drink(s) per week   She does not smoke.  Patient is a former smoker. Are there smokers in your home (other than you)?  No  Alcohol Current alcohol use: social drinker  Caffeine Current caffeine use: denies use  Exercise Exercise limitations: The patient has no exercise limitations. Current exercise: none  Nutrition/Diet Current diet: in general, a "healthy" diet    Cardiac risk factors: advanced age (older than 107 for men, 40 for women), diabetes mellitus, dyslipidemia, hypertension, obesity (BMI >= 30 kg/m2) and sedentary lifestyle.  Depression Screen (Note: if answer to either of the following is "Yes", a more complete depression screening is indicated)   Q1: Over the past two weeks, have you felt down, depressed or hopeless? No  Q2: Over the past two weeks, have you felt little interest or pleasure in doing things? No  Have you lost interest or pleasure in daily life? No  Do you often feel hopeless? No  Do you cry easily over simple problems? No  Activities of Daily Living In your present state of health, do you have any  difficulty performing the following activities?:  Driving? No Managing money?  No Feeding yourself? No Getting from bed to chair? No Climbing a flight of stairs? No Preparing food and eating?: No Bathing or showering? No Getting dressed: No Getting to the toilet? No Using the toilet:No Moving around from place to place: No In the past year have you fallen or had a near fall?:No   Are you sexually active?  No  Do you have more than one partner?  No  Vision Difficulties: No  Hearing Difficulties: No Do you often ask people to speak up or repeat themselves? No Do you experience ringing or noises in your ears? No Do you have difficulty understanding soft or whispered voices?  No  Cognition  Do you feel that you have a problem with memory?No  Do you often misplace items? No  Do you feel safe at home?  Yes  Advanced directives Does patient have a Greenback? Yes Does patient have a Living Will? Yes    Objective:   Blood pressure 120/78, pulse 60, temperature 97.7 F (36.5 C), resp. rate 16, weight 155 lb (70.308 kg). Body mass index is 27.67 kg/(m^2).  General appearance: alert, no distress, WD/WN,  female Cognitive Testing  Alert? Yes  Normal Appearance?Yes  Oriented to person? Yes  Place? Yes   Time? Yes  Recall of three objects?  Yes  Can perform simple calculations? Yes  Displays appropriate judgment?Yes  Can read the correct time from a watch face?Yes  HEENT: normocephalic, sclerae anicteric, TMs pearly, nares patent, no discharge or erythema, pharynx normal Oral cavity: MMM, no lesions Neck: supple, no lymphadenopathy, no thyromegaly, no masses Heart: RRR, normal S1, S2, no murmurs Lungs: CTA bilaterally, no wheezes, rhonchi, or rales Abdomen: +bs, soft, non tender, non distended, no masses, no hepatomegaly, no splenomegaly Musculoskeletal: nontender, no swelling, no obvious deformity Extremities: no edema, no cyanosis, no clubbing Pulses:  2+ symmetric, upper and lower extremities, normal cap refill Neurological: alert, oriented x 3, CN2-12 intact, strength normal upper extremities and lower extremities, sensation normal throughout, DTRs 2+ throughout, no cerebellar signs, gait normal Psychiatric: normal affect, behavior normal, pleasant  Breast: defer Gyn: defer Rectal: defer   Assessment:   1. Unspecified essential hypertension - CBC with Differential - BASIC METABOLIC PANEL WITH GFR - Hepatic function panel - TSH  2. T2 NIDDM Discussed general issues about diabetes pathophysiology and management., Educational material distributed., Suggested low cholesterol diet., Encouraged aerobic exercise., Discussed foot care., Reminded to get yearly retinal exam. Unable to do enough urine today for culture and microalbumin, will check next visit.  - Hemoglobin A1c - Insulin, fasting  3. Mixed hyperlipidemia - Lipid panel  4. Vitamin D Deficiency  5. Encounter for long-term (current) use of other medications - Magnesium  6. Dizziness-  Normal neuro exam, RRR, CTAB- check labs/urine? Infection/dehydration Decrease atenolol to 1/2 pill daily If any changes in vision/speech/CP/SOB go to the ER - Urine culture - Urinalysis, Routine w reflex microscopic    Plan:   During the course of the visit the patient was educated and counseled about appropriate screening and preventive services including:    Pneumococcal vaccine   Influenza vaccine  Td vaccine  Screening electrocardiogram  Screening mammography  Bone densitometry screening  Colorectal cancer screening  Diabetes screening  Glaucoma screening  Nutrition counseling   Screening recommendations, referrals:  Vaccinations: Tdap vaccine not indicated Influenza vaccine not indicated Pneumococcal vaccine not indicated Shingles vaccine not indicated Hep B vaccine not indicated  Nutrition assessed and recommended  Colonoscopy Due 2019 Mammogram  Patient declines, despite going over the risks of not getting a MGM Pap smear not indicated Pelvic exam not indicated Recommended yearly ophthalmology/optometry visit for glaucoma screening and checkup Recommended yearly dental visit for hygiene and checkup Advanced directives - requested  Conditions/risks identified: BMI: Discussed weight loss, diet, and increase physical activity.  Increase physical activity: AHA recommends 150 minutes of physical activity a week.  Medications reviewed DEXA- due to increased stress would prefer to wait, but will schedule at CPE Diabetes is at goal, ACE/ARB therapy: Yes. Urinary Incontinence is not an issue: discussed non pharmacology and pharmacology options.  Fall risk: moderate- discussed PT, home  fall assessment, medications.   Medicare Attestation I have personally reviewed: The patient's medical and social history Their use of alcohol, tobacco or illicit drugs Their current medications and supplements The patient's functional ability including ADLs,fall risks, home safety risks, cognitive, and hearing and visual impairment Diet and physical activities Evidence for depression or mood disorders  The patient's weight, height, BMI, and visual acuity have been recorded in the chart.  I have made referrals, counseling, and provided education to the patient based on review of the above and I have provided the patient with a written personalized care plan for preventive services.     Vicie Mutters, PA-C   10/25/2013

## 2013-10-26 LAB — URINE CULTURE: Colony Count: 2000

## 2013-10-26 LAB — INSULIN, FASTING: Insulin fasting, serum: 7 u[IU]/mL (ref 3–28)

## 2013-10-26 NOTE — Addendum Note (Signed)
Addended by: Vicie Mutters R on: 10/26/2013 01:22 PM   Modules accepted: Orders

## 2013-12-22 DIAGNOSIS — Z981 Arthrodesis status: Secondary | ICD-10-CM | POA: Diagnosis not present

## 2013-12-22 DIAGNOSIS — M546 Pain in thoracic spine: Secondary | ICD-10-CM | POA: Diagnosis not present

## 2013-12-22 DIAGNOSIS — M542 Cervicalgia: Secondary | ICD-10-CM | POA: Diagnosis not present

## 2013-12-30 DIAGNOSIS — M546 Pain in thoracic spine: Secondary | ICD-10-CM | POA: Diagnosis not present

## 2013-12-30 DIAGNOSIS — M542 Cervicalgia: Secondary | ICD-10-CM | POA: Diagnosis not present

## 2014-01-06 DIAGNOSIS — M5090 Cervical disc disorder, unspecified, unspecified cervical region: Secondary | ICD-10-CM | POA: Diagnosis not present

## 2014-01-06 DIAGNOSIS — M546 Pain in thoracic spine: Secondary | ICD-10-CM | POA: Diagnosis not present

## 2014-01-06 DIAGNOSIS — M961 Postlaminectomy syndrome, not elsewhere classified: Secondary | ICD-10-CM | POA: Diagnosis not present

## 2014-01-23 ENCOUNTER — Encounter: Payer: Self-pay | Admitting: Internal Medicine

## 2014-01-23 DIAGNOSIS — Z79899 Other long term (current) drug therapy: Secondary | ICD-10-CM | POA: Insufficient documentation

## 2014-01-23 NOTE — Progress Notes (Signed)
Patient ID: Debra Collins, female   DOB: [birth year], 78 y.o.   MRN: [id number]   This very nice 78 y.o.MWF presents for 3 month follow up with Hypertension, Hyperlipidemia, Pre-Diabetes and Vitamin D Deficiency.    HTN predates since 87. BP has been controlled at home. Today's BP: 126/80 mmHg. Patient denies any cardiac type chest pain, palpitations, dyspnea/orthopnea/PND, claudication, or dependent edema. She does report occasional postural lightheadedness after bending to a standing position.   Hyperlipidemia is controlled with diet & meds. Patient denies myalgias or other med SE's. Current Lipid profile is at goal as below.   Lab Results  Component Value Date   CHOL 160 01/24/2014   HDL 45 01/24/2014   LDLCALC 87 01/24/2014   TRIG 141 01/24/2014   CHOLHDL 3.6 01/24/2014    Also, the patient has history of PreDiabetes since 2009 and last A1c was 6.1% in Apr 2015.  Patient denies any symptoms of reactive hypoglycemia, diabetic polys, paresthesias or visual blurring.   Further, Patient has history of Vitamin D Deficiency   and last vitamin D was 41 in Oct 2014.  Patient supplements vitamin D without any suspected side-effects.   Medication List   aspirin 81 MG tablet  Take 81 mg by mouth daily.     atenolol 100 MG tablet  Commonly known as:  TENORMIN  Take 1 tablet (100 mg total) by mouth daily.     atorvastatin 80 MG tablet  Commonly known as:  LIPITOR  Take 80 mg by mouth daily. Takes 1/2 tab daily     CALCITRATE PO  Take 500 mg by mouth daily.     FISH OIL PO  Take 360 mg by mouth daily.     FLAXSEED OIL PO  Take 1,400 mg by mouth daily.     glucose blood test strip  Check glucose daily     lisinopril 20 MG tablet  Commonly known as:  PRINIVIL,ZESTRIL  Take 20 mg by mouth daily.     meloxicam 15 MG tablet  Commonly known as:  MOBIC     metFORMIN 500 MG 24 hr tablet  Commonly known as:  GLUCOPHAGE XR  Take 1 tablet twice daily with Breakfast and Lunch and take 2  tablets with Supper for Diabetes     MILK THISTLE PO  Take by mouth 2 (two) times daily.     multivitamin tablet  Take 1 tablet by mouth daily.     predniSONE 20 MG tablet  Commonly known as:  DELTASONE  Take one pill two times daily for 3 days, take one pill daily for 4 days.     verapamil 120 MG tablet  Commonly known as:  CALAN  Take 60 mg by mouth 2 (two) times daily.     vitamin B-12 1000 MCG tablet  Commonly known as:  CYANOCOBALAMIN  Take 1,000 mcg by mouth daily.     Vitamin D-3 1000 UNITS Caps  Take 2,000 Units by mouth 3 (three) times daily.     Allergies  Allergen Reactions  . Diltiazem    PMHx:   Past Medical History  Diagnosis Date  . Hypertension   . Hyperlipidemia   . Vitamin D deficiency   . DDD (degenerative disc disease), lumbar   . Type II or unspecified type diabetes mellitus without mention of complication, not stated as uncontrolled   . Fatty liver disease, nonalcoholic     CT AB 1660   FHx:    Reviewed / unchanged  SHx:    Reviewed / unchanged  Systems Review:  Constitutional: Denies fever, chills, wt changes, headaches, insomnia, fatigue, night sweats, change in appetite. Eyes: Denies redness, blurred vision, diplopia, discharge, itchy, watery eyes.  ENT: Denies discharge, congestion, post nasal drip, epistaxis, sore throat, earache, hearing loss, dental pain, tinnitus, vertigo, sinus pain, snoring.  CV: Denies chest pain, palpitations, irregular heartbeat, syncope, dyspnea, diaphoresis, orthopnea, PND, claudication or edema. Respiratory: denies cough, dyspnea, DOE, pleurisy, hoarseness, laryngitis, wheezing.  Gastrointestinal: Denies dysphagia, odynophagia, heartburn, reflux, water brash, abdominal pain or cramps, nausea, vomiting, bloating, diarrhea, constipation, hematemesis, melena, hematochezia  or hemorrhoids. Genitourinary: Denies dysuria, frequency, urgency, nocturia, hesitancy, discharge, hematuria or flank pain. Musculoskeletal:  Denies arthralgias, myalgias, stiffness, jt. swelling, pain, limping or strain/sprain.  Skin: Denies pruritus, rash, hives, warts, acne, eczema or change in skin lesion(s). Neuro: No weakness, tremor, incoordination, spasms, paresthesia or pain. Psychiatric: Denies confusion, memory loss or sensory loss. Endo: Denies change in weight, skin or hair change.  Heme/Lymph: No excessive bleeding, bruising or enlarged lymph nodes.  Exam:  BP 126/80  Pulse 64  Temp(Src) 97.4 F (36.3 C) (Temporal)  Resp 16  Ht 5\' 3"  (1.6 m)  Wt 161 lb 6.4 oz (73.211 kg)  BMI 28.60 kg/m2  Appears well nourished and in no distress. Eyes: PERRLA, EOMs, conjunctiva no swelling or erythema. Sinuses: No frontal/maxillary tenderness ENT/Mouth: EAC's clear, TM's nl w/o erythema, bulging. Nares clear w/o erythema, swelling, exudates. Oropharynx clear without erythema or exudates. Oral hygiene is good. Tongue normal, non obstructing. Hearing intact.  Neck: Supple. Thyroid nl. Car 2+/2+ without bruits, nodes or JVD. Chest: Respirations nl with BS clear & equal w/o rales, rhonchi, wheezing or stridor.  Cor: Heart sounds normal w/ regular rate and rhythm without sig. murmurs, gallops, clicks, or rubs. Peripheral pulses normal and equal  without edema.  Abdomen: Soft & bowel sounds normal. Non-tender w/o guarding, rebound, hernias, masses, or organomegaly.  Lymphatics: Unremarkable.  Musculoskeletal: Full ROM all peripheral extremities, joint stability, 5/5 strength, and normal gait.  Skin: Warm, dry without exposed rashes, lesions or ecchymosis apparent.  Neuro: Cranial nerves intact, reflexes equal bilaterally. Sensory-motor testing grossly intact. Tendon reflexes grossly intact.  Pysch: Alert & oriented x 3. Insight and judgement nl & appropriate. No ideations.  Assessment and Plan:  1. Hypertension - Continue monitor blood pressure at home. Discussed decreasing Atenolol to 1/2 Tab = 50 mg qd.  2. Hyperlipidemia -  Continue diet/meds, exercise,& lifestyle modifications. Continue monitor periodic cholesterol/liver & renal functions   3. T2_NIDDM - continue recommend prudent low glycemic diet, weight control, regular exercise, diabetic monitoring and periodic eye exams.  4. Vitamin D Deficiency - Continue supplementation.  Recommended regular exercise, BP monitoring, weight control, and discussed med and SE's. Recommended labs to assess and monitor clinical status. Further disposition pending results of labs.

## 2014-01-23 NOTE — Patient Instructions (Addendum)
  Try cut Atenolol dose 100 mg in 1/2   to see if helps with lightheadedness  ++++++++++++++++++++++++++++++++++++++++++++++++   Recommend the book "The END of DIETING" by Dr Baker Janus   and the book "The END of DIABETES " by Dr Excell Seltzer  At [email or web address] - get book & Audio CD's      Being diabetic has a  300% increased risk for heart attack, stroke, cancer, and alzheimer- type vascular dementia. It is very important that you work harder with diet by avoiding all foods that are white except chicken & fish. Avoid white rice (brown & wild rice is OK), white potatoes (sweetpotatoes in moderation is OK), White bread or wheat bread or anything made out of white flour like bagels, donuts, rolls, buns, biscuits, cakes, pastries, cookies, pizza crust, and pasta (made from white flour & egg whites) - vegetarian pasta or spinach or wheat pasta is OK. Multigrain breads like Arnold's or [employer], or multigrain sandwich thins or flatbreads.  Diet, exercise and weight loss can reverse and cure diabetes in the early stages.  Diet, exercise and weight loss is very important in the control and prevention of complications of diabetes which affects every system in your body, ie. Brain - dementia/stroke, eyes - glaucoma/blindness, heart - heart attack/heart failure, kidneys - dialysis, stomach - gastric paralysis, intestines - malabsorption, nerves - severe painful neuritis, circulation - gangrene & loss of a leg(s), and finally cancer and Alzheimers.    I recommend avoid fried & greasy foods,  sweets/candy, white rice (brown or wild rice or Quinoa is OK), white potatoes (sweet potatoes are OK) - anything made from white flour - bagels, doughnuts, rolls, buns, biscuits,white and wheat breads, pizza crust and traditional pasta made of white flour & egg white(vegetarian pasta or spinach or wheat pasta is OK).  Multi-grain bread is OK - like multi-grain flat bread or sandwich thins. Avoid alcohol in excess.  Exercise is also important.    Eat all the vegetables you want - avoid meat, especially red meat and dairy - especially cheese.  Cheese is the most concentrated form of trans-fats which is the worst thing to clog up our arteries. Veggie cheese is OK which can be found in the fresh produce section at [facility] or [employer] or [employer]

## 2014-01-24 ENCOUNTER — Encounter: Payer: Self-pay | Admitting: Internal Medicine

## 2014-01-24 ENCOUNTER — Ambulatory Visit (INDEPENDENT_AMBULATORY_CARE_PROVIDER_SITE_OTHER): Payer: Medicare Other | Admitting: Internal Medicine

## 2014-01-24 VITALS — BP 126/80 | HR 64 | Temp 97.4°F | Resp 16 | Ht 63.0 in | Wt 161.4 lb

## 2014-01-24 DIAGNOSIS — I10 Essential (primary) hypertension: Secondary | ICD-10-CM | POA: Diagnosis not present

## 2014-01-24 DIAGNOSIS — E559 Vitamin D deficiency, unspecified: Secondary | ICD-10-CM

## 2014-01-24 DIAGNOSIS — E782 Mixed hyperlipidemia: Secondary | ICD-10-CM | POA: Diagnosis not present

## 2014-01-24 DIAGNOSIS — Z79899 Other long term (current) drug therapy: Secondary | ICD-10-CM | POA: Diagnosis not present

## 2014-01-24 DIAGNOSIS — E119 Type 2 diabetes mellitus without complications: Secondary | ICD-10-CM | POA: Diagnosis not present

## 2014-01-24 LAB — HEPATIC FUNCTION PANEL
ALK PHOS: 43 U/L (ref 39–117)
ALT: 46 U/L — ABNORMAL HIGH (ref 0–35)
AST: 35 U/L (ref 0–37)
Albumin: 4.7 g/dL (ref 3.5–5.2)
BILIRUBIN INDIRECT: 0.9 mg/dL (ref 0.2–1.2)
BILIRUBIN TOTAL: 1.1 mg/dL (ref 0.2–1.2)
Bilirubin, Direct: 0.2 mg/dL (ref 0.0–0.3)
TOTAL PROTEIN: 6.8 g/dL (ref 6.0–8.3)

## 2014-01-24 LAB — BASIC METABOLIC PANEL WITH GFR
BUN: 23 mg/dL (ref 6–23)
CHLORIDE: 101 meq/L (ref 96–112)
CO2: 29 meq/L (ref 19–32)
CREATININE: 0.65 mg/dL (ref 0.50–1.10)
Calcium: 9.7 mg/dL (ref 8.4–10.5)
GFR, EST NON AFRICAN AMERICAN: 87 mL/min
GFR, Est African American: >89 mL/min
Glucose, Bld: 96 mg/dL (ref 70–99)
Potassium: 4.6 mEq/L (ref 3.5–5.3)
Sodium: 139 mEq/L (ref 135–145)

## 2014-01-24 LAB — CBC WITH DIFFERENTIAL/PLATELET
Basophils Absolute: 0 10*3/uL (ref 0.0–0.1)
Basophils Relative: 0 % (ref 0–1)
EOS PCT: 3 % (ref 0–5)
Eosinophils Absolute: 0.2 10*3/uL (ref 0.0–0.7)
HEMATOCRIT: 41.3 % (ref 36.0–46.0)
HEMOGLOBIN: 14.2 g/dL (ref 12.0–15.0)
LYMPHS ABS: 1.6 10*3/uL (ref 0.7–4.0)
LYMPHS PCT: 31 % (ref 12–46)
MCH: 32.8 pg (ref 26.0–34.0)
MCHC: 34.4 g/dL (ref 30.0–36.0)
MCV: 95.4 fL (ref 78.0–100.0)
MONO ABS: 0.5 10*3/uL (ref 0.1–1.0)
MONOS PCT: 10 % (ref 3–12)
Neutro Abs: 2.9 10*3/uL (ref 1.7–7.7)
Neutrophils Relative %: 56 % (ref 43–77)
Platelets: 178 10*3/uL (ref 150–400)
RBC: 4.33 MIL/uL (ref 3.87–5.11)
RDW: 13.6 % (ref 11.5–15.5)
WBC: 5.2 10*3/uL (ref 4.0–10.5)

## 2014-01-24 LAB — LIPID PANEL
Cholesterol: 160 mg/dL (ref 0–200)
HDL: 45 mg/dL (ref >39)
LDL Cholesterol: 87 mg/dL (ref 0–99)
TRIGLYCERIDES: 141 mg/dL (ref <150)
Total CHOL/HDL Ratio: 3.6 Ratio
VLDL: 28 mg/dL (ref 0–40)

## 2014-01-24 LAB — TSH: TSH: 2.593 u[IU]/mL (ref 0.350–4.500)

## 2014-01-24 LAB — HEMOGLOBIN A1C
Hgb A1c MFr Bld: 5.6 % (ref <5.7)
Mean Plasma Glucose: 114 mg/dL (ref <117)

## 2014-01-24 LAB — MAGNESIUM: Magnesium: 1.9 mg/dL (ref 1.5–2.5)

## 2014-01-25 DIAGNOSIS — M542 Cervicalgia: Secondary | ICD-10-CM | POA: Diagnosis not present

## 2014-01-25 LAB — INSULIN, FASTING: INSULIN FASTING, SERUM: 14 u[IU]/mL (ref 3–28)

## 2014-01-25 LAB — VITAMIN D 25 HYDROXY (VIT D DEFICIENCY, FRACTURES): VIT D 25 HYDROXY: 75 ng/mL (ref 30–89)

## 2014-02-09 DIAGNOSIS — M542 Cervicalgia: Secondary | ICD-10-CM | POA: Diagnosis not present

## 2014-02-09 DIAGNOSIS — M961 Postlaminectomy syndrome, not elsewhere classified: Secondary | ICD-10-CM | POA: Diagnosis not present

## 2014-02-21 ENCOUNTER — Other Ambulatory Visit: Payer: Self-pay | Admitting: Internal Medicine

## 2014-04-18 ENCOUNTER — Other Ambulatory Visit: Payer: Self-pay | Admitting: Emergency Medicine

## 2014-04-26 ENCOUNTER — Ambulatory Visit (INDEPENDENT_AMBULATORY_CARE_PROVIDER_SITE_OTHER): Payer: Medicare Other | Admitting: Physician Assistant

## 2014-04-26 ENCOUNTER — Encounter: Payer: Self-pay | Admitting: Physician Assistant

## 2014-04-26 VITALS — BP 122/72 | HR 60 | Temp 97.7°F | Resp 16 | Wt 161.0 lb

## 2014-04-26 DIAGNOSIS — I10 Essential (primary) hypertension: Secondary | ICD-10-CM

## 2014-04-26 DIAGNOSIS — E782 Mixed hyperlipidemia: Secondary | ICD-10-CM

## 2014-04-26 DIAGNOSIS — Z79899 Other long term (current) drug therapy: Secondary | ICD-10-CM | POA: Diagnosis not present

## 2014-04-26 DIAGNOSIS — E559 Vitamin D deficiency, unspecified: Secondary | ICD-10-CM

## 2014-04-26 DIAGNOSIS — E1122 Type 2 diabetes mellitus with diabetic chronic kidney disease: Secondary | ICD-10-CM | POA: Diagnosis not present

## 2014-04-26 DIAGNOSIS — Z23 Encounter for immunization: Secondary | ICD-10-CM

## 2014-04-26 DIAGNOSIS — E1129 Type 2 diabetes mellitus with other diabetic kidney complication: Secondary | ICD-10-CM | POA: Diagnosis not present

## 2014-04-26 DIAGNOSIS — M5432 Sciatica, left side: Secondary | ICD-10-CM | POA: Diagnosis not present

## 2014-04-26 DIAGNOSIS — N182 Chronic kidney disease, stage 2 (mild): Secondary | ICD-10-CM

## 2014-04-26 LAB — HEPATIC FUNCTION PANEL
ALK PHOS: 44 U/L (ref 39–117)
ALT: 46 U/L — ABNORMAL HIGH (ref 0–35)
AST: 32 U/L (ref 0–37)
Albumin: 4.6 g/dL (ref 3.5–5.2)
BILIRUBIN INDIRECT: 0.9 mg/dL (ref 0.2–1.2)
Bilirubin, Direct: 0.2 mg/dL (ref 0.0–0.3)
TOTAL PROTEIN: 6.7 g/dL (ref 6.0–8.3)
Total Bilirubin: 1.1 mg/dL (ref 0.2–1.2)

## 2014-04-26 LAB — CBC WITH DIFFERENTIAL/PLATELET
BASOS ABS: 0 10*3/uL (ref 0.0–0.1)
BASOS PCT: 0 % (ref 0–1)
Eosinophils Absolute: 0.1 10*3/uL (ref 0.0–0.7)
Eosinophils Relative: 2 % (ref 0–5)
HCT: 41.5 % (ref 36.0–46.0)
Hemoglobin: 14.3 g/dL (ref 12.0–15.0)
Lymphocytes Relative: 31 % (ref 12–46)
Lymphs Abs: 1.4 10*3/uL (ref 0.7–4.0)
MCH: 32.1 pg (ref 26.0–34.0)
MCHC: 34.5 g/dL (ref 30.0–36.0)
MCV: 93.3 fL (ref 78.0–100.0)
Monocytes Absolute: 0.4 10*3/uL (ref 0.1–1.0)
Monocytes Relative: 9 % (ref 3–12)
NEUTROS ABS: 2.6 10*3/uL (ref 1.7–7.7)
Neutrophils Relative %: 58 % (ref 43–77)
Platelets: 185 10*3/uL (ref 150–400)
RBC: 4.45 MIL/uL (ref 3.87–5.11)
RDW: 14.1 % (ref 11.5–15.5)
WBC: 4.5 10*3/uL (ref 4.0–10.5)

## 2014-04-26 LAB — BASIC METABOLIC PANEL WITH GFR
BUN: 21 mg/dL (ref 6–23)
CHLORIDE: 102 meq/L (ref 96–112)
CO2: 27 mEq/L (ref 19–32)
Calcium: 9.6 mg/dL (ref 8.4–10.5)
Creat: 0.67 mg/dL (ref 0.50–1.10)
GFR, Est African American: >89 mL/min
GFR, Est Non African American: 86 mL/min
Glucose, Bld: 117 mg/dL — ABNORMAL HIGH (ref 70–99)
Potassium: 4.3 mEq/L (ref 3.5–5.3)
SODIUM: 140 meq/L (ref 135–145)

## 2014-04-26 LAB — LIPID PANEL
CHOL/HDL RATIO: 3.8 ratio
Cholesterol: 155 mg/dL (ref 0–200)
HDL: 41 mg/dL (ref >39)
LDL CALC: 74 mg/dL (ref 0–99)
TRIGLYCERIDES: 200 mg/dL — AB (ref <150)
VLDL: 40 mg/dL (ref 0–40)

## 2014-04-26 LAB — MAGNESIUM: Magnesium: 2 mg/dL (ref 1.5–2.5)

## 2014-04-26 LAB — HEMOGLOBIN A1C
Hgb A1c MFr Bld: 5.9 % — ABNORMAL HIGH (ref <5.7)
Mean Plasma Glucose: 123 mg/dL — ABNORMAL HIGH (ref <117)

## 2014-04-26 LAB — TSH: TSH: 2.485 u[IU]/mL (ref 0.350–4.500)

## 2014-04-26 MED ORDER — DEXAMETHASONE SODIUM PHOSPHATE 100 MG/10ML IJ SOLN
10.0000 mg | Freq: Once | INTRAMUSCULAR | Status: AC
  Administered 2014-04-26: 10 mg via INTRAMUSCULAR

## 2014-04-26 NOTE — Progress Notes (Signed)
Assessment and Plan:  Hypertension: Continue medication, monitor blood pressure at home. Continue DASH diet. Cholesterol: Continue diet and exercise. Check cholesterol.  Diabetes-Continue diet and exercise. Check A1C Vitamin D Def- check level and continue medications.  Left SI pain, negative straight leg, no bowel/bladder problems Injection- area cleaned with alcohol, Dexamethasone 10mg  and 1 CC lidocaine injected into left SI tolerated well with immediate relief.  Mobic, RICE, and exercise given If not better with refer to orthopedics.    Continue diet and meds as discussed. Further disposition pending results of labs. Discussed med's effects and SE's.    HPI 78 y.o. female  presents for 3 month follow up with hypertension, hyperlipidemia, diabetes and vitamin D. Her blood pressure has been controlled at home, today their BP is BP: 122/72 mmHg She does not workout. She denies chest pain, shortness of breath, dizziness.  She is on cholesterol medication and denies myalgias. Her cholesterol is at goal. The cholesterol last visit was:   Lab Results  Component Value Date   CHOL 160 01/24/2014   HDL 45 01/24/2014   LDLCALC 87 01/24/2014   TRIG 141 01/24/2014   CHOLHDL 3.6 01/24/2014   She has been working on diet and exercise for Diabetes, her last A1C was 5.6, she is on MF and ACE, and denies hypoglycemia , polydipsia, polyuria and visual disturbances. Last A1C in the office was:  Lab Results  Component Value Date   HGBA1C 5.6 01/24/2014   Patient is on Vitamin D supplement. Lab Results  Component Value Date   VD25OH 57 01/24/2014     She had injection for cervical DDD with [facility], she complains of left hip pain, worse with sleeping on it and worse with sitting for a long time. She has constant left leg pain since after her lumbar surgery 3 years ago.   Current Medications:  Current Outpatient Prescriptions on File Prior to Visit  Medication Sig Dispense Refill  . aspirin 81 MG  tablet Take 81 mg by mouth daily.      Marland Kitchen atenolol (TENORMIN) 100 MG tablet Take 1 tablet (100 mg total) by mouth daily.  90 tablet  99  . atorvastatin (LIPITOR) 80 MG tablet Take 80 mg by mouth daily. Takes 1/2 tab daily      . Calcium Citrate (CALCITRATE PO) Take 500 mg by mouth daily.      . Cholecalciferol (VITAMIN D-3) 1000 UNITS CAPS Take 2,000 Units by mouth 3 (three) times daily.       . Flaxseed, Linseed, (FLAXSEED OIL PO) Take 1,400 mg by mouth daily.      Marland Kitchen glucose blood test strip Check glucose daily  100 each  99  . lisinopril (PRINIVIL,ZESTRIL) 20 MG tablet Take 1 tablet by mouth  every evening for blood  pressure  90 tablet  1  . meloxicam (MOBIC) 15 MG tablet Take 1 tablet by mouth  daily as needed for pain  and inflammation  90 tablet  99  . metFORMIN (GLUCOPHAGE XR) 500 MG 24 hr tablet Take 1 tablet twice daily with Breakfast and Lunch and take 2 tablets with Supper for Diabetes  360 tablet  99  . MILK THISTLE PO Take by mouth 2 (two) times daily.      . Multiple Vitamin (MULTIVITAMIN) tablet Take 1 tablet by mouth daily.      . Omega-3 Fatty Acids (FISH OIL PO) Take 360 mg by mouth daily.      Marland Kitchen omeprazole (PRILOSEC) 20 MG capsule       .  vitamin B-12 (CYANOCOBALAMIN) 1000 MCG tablet Take 1,000 mcg by mouth daily.       No current facility-administered medications on file prior to visit.   Medical History:  Past Medical History  Diagnosis Date  . Hypertension   . Hyperlipidemia   . Vitamin D deficiency   . DDD (degenerative disc disease), lumbar   . Type II or unspecified type diabetes mellitus without mention of complication, not stated as uncontrolled   . Fatty liver disease, nonalcoholic     CT AB 3532   Allergies:  Allergies  Allergen Reactions  . Diltiazem      Review of Systems: [X]  = complains of  [ ]  = denies  General: Fatigue [ ]  Fever [ ]  Chills [ ]  Weakness [ ]   Insomnia [ ]  Eyes: Redness [ ]  Blurred vision [ ]  Diplopia [ ]   ENT: Congestion [ ]   Sinus Pain [ ]  Post Nasal Drip [ ]  Sore Throat [ ]  Earache [ ]   Cardiac: Chest pain/pressure [ ]  SOB [ ]  Orthopnea [ ]   Palpitations [ ]   Paroxysmal nocturnal dyspnea[ ]  Claudication [ ]  Edema [ ]   Pulmonary: Cough [ ]  Wheezing[ ]   SOB [ ]   Snoring [ ]   GI: Nausea [ ]  Vomiting[ ]  Dysphagia[ ]  Heartburn[ ]  Abdominal pain [ ]  Constipation [ ] ; Diarrhea [ ] ; BRBPR [ ]  Melena[ ]  GU: Hematuria[ ]  Dysuria [ ]  Nocturia[ ]  Urgency [ ]   Hesitancy [ ]  Discharge [ ]  Neuro: Headaches[ ]  Vertigo[ ]  Paresthesias[ ]  Spasm [ ]  Speech changes [ ]  Incoordination [ ]   Ortho: Arthritis [x ] Joint pain [x ] Muscle pain [ ]  Joint swelling [ ]  Back Pain [ x] Skin:  Rash [ ]   Pruritis [ ]  Change in skin lesion [ ]   Psych: Depression[ ]  Anxiety[ ]  Confusion [ ]  Memory loss [ ]   Heme/Lypmh: Bleeding [ ]  Bruising [ ]  Enlarged lymph nodes [ ]   Endocrine: Visual blurring [ ]  Paresthesia [ ]  Polyuria [ ]  Polydypsea [ ]    Heat/cold intolerance [ ]  Hypoglycemia [ ]   Family history- Review and unchanged Social history- Review and unchanged Physical Exam: BP 122/72  Pulse 60  Temp(Src) 97.7 F (36.5 C)  Resp 16  Wt 161 lb (73.029 kg) Wt Readings from Last 3 Encounters:  04/26/14 161 lb (73.029 kg)  01/24/14 161 lb 6.4 oz (73.211 kg)  10/25/13 155 lb (70.308 kg)   General Appearance: Well nourished, in no apparent distress. Eyes: PERRLA, EOMs, conjunctiva no swelling or erythema Sinuses: No Frontal/maxillary tenderness ENT/Mouth: Ext aud canals clear, TMs without erythema, bulging. No erythema, swelling, or exudate on post pharynx.  Tonsils not swollen or erythematous. Hearing normal.  Neck: Supple, thyroid normal.  Respiratory: Respiratory effort normal, BS equal bilaterally without rales, rhonchi, wheezing or stridor.  Cardio: RRR with no MRGs. Brisk peripheral pulses without edema.  Abdomen: Soft, + BS.  Non tender, no guarding, rebound, hernias, masses. Lymphatics: Non tender without lymphadenopathy.   Musculoskeletal: Full ROM, 5/5 strength, normal gait. + Left SI tenderness to palpation, negative straight leg. Skin: Warm, dry without rashes, lesions, ecchymosis.  Neuro: Cranial nerves intact. No cerebellar symptoms. Sensation intact.  Psych: Awake and oriented X 3, normal affect, Insight and Judgment appropriate.    Debra Collins 10:10 AM

## 2014-04-27 ENCOUNTER — Other Ambulatory Visit: Payer: Self-pay | Admitting: Internal Medicine

## 2014-07-07 DIAGNOSIS — J019 Acute sinusitis, unspecified: Secondary | ICD-10-CM | POA: Diagnosis not present

## 2014-07-07 DIAGNOSIS — J22 Unspecified acute lower respiratory infection: Secondary | ICD-10-CM | POA: Diagnosis not present

## 2014-07-20 DIAGNOSIS — E119 Type 2 diabetes mellitus without complications: Secondary | ICD-10-CM | POA: Diagnosis not present

## 2014-07-26 ENCOUNTER — Other Ambulatory Visit: Payer: Self-pay | Admitting: Internal Medicine

## 2014-07-26 ENCOUNTER — Encounter: Payer: Self-pay | Admitting: Internal Medicine

## 2014-08-16 ENCOUNTER — Ambulatory Visit (INDEPENDENT_AMBULATORY_CARE_PROVIDER_SITE_OTHER): Payer: Medicare Other | Admitting: Internal Medicine

## 2014-08-16 ENCOUNTER — Encounter: Payer: Self-pay | Admitting: Internal Medicine

## 2014-08-16 VITALS — BP 158/84 | HR 72 | Temp 97.5°F | Resp 16 | Ht 63.0 in | Wt 170.0 lb

## 2014-08-16 DIAGNOSIS — N182 Chronic kidney disease, stage 2 (mild): Secondary | ICD-10-CM

## 2014-08-16 DIAGNOSIS — E1121 Type 2 diabetes mellitus with diabetic nephropathy: Secondary | ICD-10-CM | POA: Diagnosis not present

## 2014-08-16 DIAGNOSIS — E119 Type 2 diabetes mellitus without complications: Secondary | ICD-10-CM | POA: Insufficient documentation

## 2014-08-16 DIAGNOSIS — Z79899 Other long term (current) drug therapy: Secondary | ICD-10-CM

## 2014-08-16 DIAGNOSIS — E1122 Type 2 diabetes mellitus with diabetic chronic kidney disease: Secondary | ICD-10-CM | POA: Diagnosis not present

## 2014-08-16 DIAGNOSIS — Z1331 Encounter for screening for depression: Secondary | ICD-10-CM

## 2014-08-16 DIAGNOSIS — E559 Vitamin D deficiency, unspecified: Secondary | ICD-10-CM

## 2014-08-16 DIAGNOSIS — E782 Mixed hyperlipidemia: Secondary | ICD-10-CM | POA: Diagnosis not present

## 2014-08-16 DIAGNOSIS — I10 Essential (primary) hypertension: Secondary | ICD-10-CM | POA: Diagnosis not present

## 2014-08-16 DIAGNOSIS — Z1212 Encounter for screening for malignant neoplasm of rectum: Secondary | ICD-10-CM

## 2014-08-16 DIAGNOSIS — Z9181 History of falling: Secondary | ICD-10-CM

## 2014-08-16 LAB — CBC WITH DIFFERENTIAL/PLATELET
BASOS ABS: 0 10*3/uL (ref 0.0–0.1)
BASOS PCT: 0 % (ref 0–1)
EOS ABS: 0.1 10*3/uL (ref 0.0–0.7)
EOS PCT: 2 % (ref 0–5)
HCT: 42.9 % (ref 36.0–46.0)
HEMOGLOBIN: 14.1 g/dL (ref 12.0–15.0)
Lymphocytes Relative: 29 % (ref 12–46)
Lymphs Abs: 2 10*3/uL (ref 0.7–4.0)
MCH: 31.3 pg (ref 26.0–34.0)
MCHC: 32.9 g/dL (ref 30.0–36.0)
MCV: 95.3 fL (ref 78.0–100.0)
MONO ABS: 0.7 10*3/uL (ref 0.1–1.0)
MONOS PCT: 10 % (ref 3–12)
MPV: 10.5 fL (ref 8.6–12.4)
NEUTROS ABS: 4 10*3/uL (ref 1.7–7.7)
Neutrophils Relative %: 59 % (ref 43–77)
Platelets: 220 10*3/uL (ref 150–400)
RBC: 4.5 MIL/uL (ref 3.87–5.11)
RDW: 14.2 % (ref 11.5–15.5)
WBC: 6.8 10*3/uL (ref 4.0–10.5)

## 2014-08-16 NOTE — Patient Instructions (Signed)

## 2014-08-16 NOTE — Progress Notes (Signed)
Patient ID: Debra Collins, female   DOB: 07/25/1935, 79 y.o.   MRN: [id number]  Annual Comprehensive Examination  This very nice 79 y.o.  MWF presents for complete physical.  Patient has been followed for HTN, T2_NIDDM w/CKD2, Hyperlipidemia, and Vitamin D Deficiency.    HTN predates since 1. Patient's BP has been controlled at home and patient denies any cardiac symptoms as chest pain, palpitations, shortness of breath, dizziness or ankle swelling. Today's BP is elevated ar 158/84.     Patient's hyperlipidemia is controlled with diet and medications. Patient denies myalgias or other medication SE's. Last lipids were Total Chol 155; HDL 41; LDL  74; Trig 200 on 04/26/2014.   Patient has T2_NIDDM w/CKD2  predating since 2009 and CBG's usu run in the 80-90's and  patient denies reactive hypoglycemic symptoms, visual blurring, diabetic polys, or paresthesias. Last A1c was  5.9% on 04/26/2014.   Finally, patient has history of Vitamin D Deficiency and last Vitamin D was  75 on 01/24/2014.  Medication Sig  . aspirin 81 MG tablet Take 81 mg by mouth daily.  Marland Kitchen atenolol (TENORMIN) 100 MG tablet Take 1 tablet by mouth  daily  . atorvastatin  80 MG tablet Take 1 tablet by mouth  every day (1/2 tab 3 x week)  . CALCITRATE  Take 500 mg by mouth daily.  Marland Kitchen VITAMIN D 1000 UNITS  Take 2,000 Units by mouth 3 (three) times daily.   Marland Kitchen FLAXSEED OIL  Take 1,400 mg by mouth daily.  Marland Kitchen glucose blood test strip Check glucose daily  . Lisinopril 20 MG tablet Take 1 tablet by mouth  every evening for blood  pressure  . meloxicam 15 MG tablet Take 1 tablet by mouth  daily as needed for pain  and inflammation  . metFORMIN -XR 500 MG 24 hr tablet Take 1 tablet twice daily  with breakfast and lunch and take 2 tab w/ supper   . MILK THISTLE PO Take by mouth 2 (two) times daily.  . Multiple Vitamin (MULTIVITAMIN) tablet Take 1 tablet by mouth daily.  . Omega-3 Fatty Acids (FISH OIL PO) Take 360 mg by mouth daily.  Marland Kitchen  omeprazole (PRILOSEC) 20 MG capsule Take 1 capsule by mouth  every day for GERD  . verapamil (CALAN) 120 MG tablet Take 1 tablet by mouth  once daily  . vitamin B-12 (CYANOCOBALAMIN) 1000 MCG tablet Take 1,000 mcg by mouth daily.   Allergies  Allergen Reactions  . Diltiazem    Past Medical History  Diagnosis Date  . Hypertension   . Hyperlipidemia   . Vitamin D deficiency   . DDD (degenerative disc disease), lumbar   . Type II or unspecified type diabetes mellitus without mention of complication, not stated as uncontrolled   . Fatty liver disease, nonalcoholic     CT AB 2248   Health Maintenance  Topic Date Due  . URINE MICROALBUMIN  03/11/1948  . DEXA SCAN  03/12/2003  . FOOT EXAM  10/26/2014  . HEMOGLOBIN A1C  10/26/2014  . INFLUENZA VACCINE  02/13/2015  . OPHTHALMOLOGY EXAM  07/21/2015  . COLONOSCOPY  01/19/2018  . TETANUS/TDAP  07/09/2021  . PNEUMOCOCCAL POLYSACCHARIDE VACCINE AGE 60 AND OVER  Completed  . ZOSTAVAX  Completed   Immunization History  Administered Date(s) Administered  . H1N1 06/28/2008  . Influenza Whole 05/04/2013  . Influenza, High Dose Seasonal PF 04/26/2014  . Pneumococcal Conjugate-13 04/26/2014  . Pneumococcal Polysaccharide-23 07/10/2011  . Td 07/10/2011  .  Zoster 06/28/2008   Past Surgical History  Procedure Laterality Date  . Knee arthroscopy Left   . Appendectomy    . Brain surgery     Family History  Problem Relation Age of Onset  . Stroke Mother   . Heart disease Mother   . Heart disease Father   . Heart attack Father    History  Substance Use Topics  . Smoking status: Former Smoker    Quit date: 07/15/1993  . Smokeless tobacco: Not on file  . Alcohol Use: 2.5 oz/week    5 drink(s) per week    ROS Constitutional: Denies fever, chills, weight loss/gain, headaches, insomnia, fatigue, night sweats, and change in appetite. Eyes: Denies redness, blurred vision, diplopia, discharge, itchy, watery eyes.  ENT: Denies discharge,  congestion, post nasal drip, epistaxis, sore throat, earache, hearing loss, dental pain, Tinnitus, Vertigo, Sinus pain, snoring.  Cardio: Denies chest pain, palpitations, irregular heartbeat, syncope, dyspnea, diaphoresis, orthopnea, PND, claudication, edema Respiratory: denies cough, dyspnea, DOE, pleurisy, hoarseness, laryngitis, wheezing.  Gastrointestinal: Denies dysphagia, heartburn, reflux, water brash, pain, cramps, nausea, vomiting, bloating, diarrhea, constipation, hematemesis, melena, hematochezia, jaundice, hemorrhoids Genitourinary: Denies dysuria, frequency, urgency, nocturia, hesitancy, discharge, hematuria, flank pain Breast: Breast lumps, nipple discharge, bleeding.  Musculoskeletal: Denies arthralgia, myalgia, stiffness, Jt. Swelling, pain, limp, and strain/sprain. Denies falls. Skin: Denies puritis, rash, hives, warts, acne, eczema, changing in skin lesion Neuro: No weakness, tremor, incoordination, spasms, paresthesia, pain Psychiatric: Denies confusion, memory loss, sensory loss. Denies Depression. Endocrine: Denies change in weight, skin, hair change, nocturia, and paresthesia, diabetic polys, visual blurring, hyper / hypo glycemic episodes.  Heme/Lymph: No excessive bleeding, bruising, enlarged lymph nodes.  Physical Exam  BP 158/84   Pulse 72  Temp 97.5 F   Resp 16  Ht 5\' 3"    Wt 170 lb   BMI 30.12  General Appearance: Well nourished and in no apparent distress. Eyes: PERRLA, EOMs, conjunctiva no swelling or erythema, normal fundi and vessels. Sinuses: No frontal/maxillary tenderness ENT/Mouth: EACs patent / TMs  nl. Nares clear without erythema, swelling, mucoid exudates. Oral hygiene is good. No erythema, swelling, or exudate. Tongue normal, non-obstructing. Tonsils not swollen or erythematous. Hearing normal.  Neck: Supple, thyroid normal. No bruits, nodes or JVD. Respiratory: Respiratory effort normal.  BS equal and clear bilateral without rales, rhonci,  wheezing or stridor. Cardio: Heart sounds are normal with regular rate and rhythm and no murmurs, rubs or gallops. Peripheral pulses are normal and equal bilaterally without edema. No aortic or femoral bruits. Chest: symmetric with normal excursions and percussion. Breasts: Symmetric, without lumps, nipple discharge, retractions, or fibrocystic changes.  Abdomen: Flat, soft, with bowl sounds. Nontender, no guarding, rebound, hernias, masses, or organomegaly.  Lymphatics: Non tender without lymphadenopathy.  Genitourinary:  Musculoskeletal: Full ROM all peripheral extremities, joint stability, 5/5 strength, and normal gait. Skin: Warm and dry without rashes, lesions, cyanosis, clubbing or  ecchymosis.  Neuro: Cranial nerves intact, reflexes equal bilaterally. Normal muscle tone, no cerebellar symptoms. Sensation intact by monofilament testing to the distal toes. Pysch: Awake and oriented X 3, normal affect, Insight and Judgment appropriate.   Assessment and Plan  1. Essential hypertension  - Microalbumin / creatinine urine ratio - EKG 12-Lead - [place], RETROPERITNL ABD,  LTD - TSH  2. Mixed hyperlipidemia  - Lipid panel  3. CKD stage 2 due to type 2 diabetes mellitus   4. T2_NIDDM w/CKD  - HM DIABETES FOOT EXAM - LOW EXTREMITY NEUR EXAM DOCUM - Hemoglobin A1c - Insulin,  fasting  5. Vitamin D deficiency  - Vit D  25 hydroxy   6. Encounter for long-term (current) use of medications  - Urine Microscopic - CBC with Differential/Platelet - BASIC METABOLIC PANEL WITH GFR - Hepatic function panel - Magnesium  7. Screening for rectal cancer  - POC Hemoccult Bld/Stl   8. Depression screen  - Negative  9. At low risk for fall   Continue prudent diet as discussed, weight control, BP monitoring, regular exercise, and medications. Discussed med's effects and SE's. Screening labs and tests as requested with regular follow-up as recommended.

## 2014-08-17 LAB — HEPATIC FUNCTION PANEL
ALT: 55 U/L — AB (ref 0–35)
AST: 42 U/L — AB (ref 0–37)
Albumin: 4.4 g/dL (ref 3.5–5.2)
Alkaline Phosphatase: 47 U/L (ref 39–117)
BILIRUBIN DIRECT: 0.1 mg/dL (ref 0.0–0.3)
BILIRUBIN TOTAL: 0.9 mg/dL (ref 0.2–1.2)
Indirect Bilirubin: 0.8 mg/dL (ref 0.2–1.2)
Total Protein: 6.5 g/dL (ref 6.0–8.3)

## 2014-08-17 LAB — MAGNESIUM: MAGNESIUM: 2 mg/dL (ref 1.5–2.5)

## 2014-08-17 LAB — URINALYSIS, MICROSCOPIC ONLY
Bacteria, UA: NONE SEEN
CASTS: NONE SEEN
Crystals: NONE SEEN

## 2014-08-17 LAB — BASIC METABOLIC PANEL WITH GFR
BUN: 26 mg/dL — ABNORMAL HIGH (ref 6–23)
CO2: 25 mEq/L (ref 19–32)
Calcium: 9.4 mg/dL (ref 8.4–10.5)
Chloride: 101 mEq/L (ref 96–112)
Creat: 0.62 mg/dL (ref 0.50–1.10)
GFR, Est African American: >89 mL/min
GFR, Est Non African American: 88 mL/min
GLUCOSE: 120 mg/dL — AB (ref 70–99)
Potassium: 4.6 mEq/L (ref 3.5–5.3)
Sodium: 139 mEq/L (ref 135–145)

## 2014-08-17 LAB — LIPID PANEL
CHOL/HDL RATIO: 4 ratio
Cholesterol: 165 mg/dL (ref 0–200)
HDL: 41 mg/dL (ref >39)
LDL CALC: 92 mg/dL (ref 0–99)
Triglycerides: 158 mg/dL — ABNORMAL HIGH (ref <150)
VLDL: 32 mg/dL (ref 0–40)

## 2014-08-17 LAB — HEMOGLOBIN A1C
Hgb A1c MFr Bld: 6.1 % — ABNORMAL HIGH (ref <5.7)
MEAN PLASMA GLUCOSE: 128 mg/dL — AB (ref <117)

## 2014-08-17 LAB — MICROALBUMIN / CREATININE URINE RATIO
Creatinine, Urine: 204.1 mg/dL
Microalb Creat Ratio: 14.2 mg/g (ref 0.0–30.0)
Microalb, Ur: 2.9 mg/dL — ABNORMAL HIGH (ref <2.0)

## 2014-08-17 LAB — TSH: TSH: 3.086 u[IU]/mL (ref 0.350–4.500)

## 2014-08-17 LAB — VITAMIN D 25 HYDROXY (VIT D DEFICIENCY, FRACTURES): VIT D 25 HYDROXY: 56 ng/mL (ref 30–100)

## 2014-08-17 LAB — INSULIN, FASTING: INSULIN FASTING, SERUM: 15 u[IU]/mL (ref 2.0–19.6)

## 2014-08-22 ENCOUNTER — Encounter: Payer: Self-pay | Admitting: Internal Medicine

## 2014-08-22 ENCOUNTER — Ambulatory Visit (INDEPENDENT_AMBULATORY_CARE_PROVIDER_SITE_OTHER): Payer: Medicare Other | Admitting: Internal Medicine

## 2014-08-22 VITALS — BP 130/70 | HR 72 | Temp 100.8°F | Resp 18 | Ht 63.0 in | Wt 170.0 lb

## 2014-08-22 DIAGNOSIS — J042 Acute laryngotracheitis: Secondary | ICD-10-CM

## 2014-08-22 MED ORDER — AZITHROMYCIN 250 MG PO TABS: ORAL_TABLET | ORAL | Status: DC

## 2014-08-22 MED ORDER — HYDROCODONE-ACETAMINOPHEN 5-325 MG PO TABS: ORAL_TABLET | ORAL | Status: DC

## 2014-08-22 MED ORDER — BENZONATATE 200 MG PO CAPS: 200.0000 mg | ORAL_CAPSULE | Freq: Three times a day (TID) | ORAL | Status: DC | PRN

## 2014-08-22 MED ORDER — PREDNISONE 20 MG PO TABS: ORAL_TABLET | ORAL | Status: DC

## 2014-08-22 NOTE — Progress Notes (Signed)
   Subjective:    Patient ID: Debra Collins, female    DOB: 01/01/1936, 79 y.o.   MRN: [id number]  HPI  Patient presents with ~ 1 week hx/o head/chest congestion, S/T, Hoarseness and dry cough becoming productive od a thick yellowish sputum.  No fever/chills or SOB.   Medication Sig  . aspirin 81 MG tablet Take 81 mg by mouth daily.  Marland Kitchen atenolol (TENORMIN) 100 MG tablet Take 1 tablet by mouth  daily  . atorvastatin (LIPITOR) 80 MG tablet Take 1 tablet by mouth  every day for cholesterol  . Calcium Citrate (CALCITRATE PO) Take 500 mg by mouth daily.  . Cholecalciferol (VITAMIN D PO) Take 2,000 Units by mouth 3 (three) times daily.  Marland Kitchen glucose blood test strip Check glucose daily  . lisinopril (PRINIVIL,ZESTRIL) 20 MG tablet Take 1 tablet by mouth  every evening for blood  pressure  . meloxicam (MOBIC) 15 MG tablet Take 1 tablet by mouth  daily as needed for pain  and inflammation  . metFORMIN (GLUCOPHAGE-XR) 500 MG 24 hr tablet Take 1 tablet twice daily  with breakfast and lunch  and take 2 tablets with  supper for diabetes  . MILK THISTLE PO Take by mouth 2 (two) times daily.  . Multiple Vitamin (MULTIVITAMIN) tablet Take 1 tablet by mouth daily.  Marland Kitchen omeprazole (PRILOSEC) 20 MG capsule Take 1 capsule by mouth  every day for GERD  . verapamil (CALAN) 120 MG tablet Take 1 tablet by mouth  once daily  . vitamin B-12 (CYANOCOBALAMIN) 1000 MCG tablet Take 1,000 mcg by mouth daily.   Allergies  Allergen Reactions  . Diltiazem    Past Medical History  Diagnosis Date  . Hypertension   . Hyperlipidemia   . Vitamin D deficiency   . DDD (degenerative disc disease), lumbar   . Type II or unspecified type diabetes mellitus without mention of complication, not stated as uncontrolled   . Fatty liver disease, nonalcoholic     CT AB 2458   Review of Systems (+) as above - 10 pt systems review negative otherwise.    Objective:   Physical Exam BP 130/70 mmHg  Pulse 72  Temp(Src) 100.8 F (38.2  C)  Resp 18  Ht 5\' 3"  (1.6 m)  Wt 170 lb (77.111 kg)  BMI 30.12 kg/m2  Hoarse speech - brassy dry cough - no respiratory distress  HEENT - Eac's patent. TM's Nl. EOM's full. PERRLA. Sl bilat frontal/maxillary tenderness. NasoOroPharynx clear. Neck - supple. Nl Thyroid. Carotids 2+ & No bruits, nodes, JVD Chest - Bilat scattered rales rales & rhonchi - no wheezes. Cor - Nl HS. RRR w/o sig MGR. PP 1(+). No edema. MS- FROM w/o deformities. Muscle power, tone and bulk Nl. Gait Nl. Neuro - No obvious Cr N abnormalities. Sensory, motor and Cerebellar functions appear Nl w/o focal abnormalities. Psyche - Mental status normal & appropriate.  No delusions, ideations or obvious mood abnormalities Skin - exposed - negative    Assessment & Plan:   1. Laryngotracheitis  - Rx Z pak x 1 rf, Prednisone pulse/taper, Tessalon perles 200 mg tid & Norco 5  Prn - Discussed meds/SE's & ROV prn.

## 2014-08-29 ENCOUNTER — Encounter: Payer: Self-pay | Admitting: *Deleted

## 2014-09-28 ENCOUNTER — Other Ambulatory Visit: Payer: Self-pay | Admitting: *Deleted

## 2014-09-28 DIAGNOSIS — Z1212 Encounter for screening for malignant neoplasm of rectum: Secondary | ICD-10-CM

## 2014-09-28 LAB — POC HEMOCCULT BLD/STL (HOME/3-CARD/SCREEN)
FECAL OCCULT BLD: NEGATIVE
FECAL OCCULT BLD: NEGATIVE
FECAL OCCULT BLD: NEGATIVE

## 2014-10-11 DIAGNOSIS — M961 Postlaminectomy syndrome, not elsewhere classified: Secondary | ICD-10-CM | POA: Diagnosis not present

## 2014-10-11 DIAGNOSIS — M5136 Other intervertebral disc degeneration, lumbar region: Secondary | ICD-10-CM | POA: Diagnosis not present

## 2014-10-18 DIAGNOSIS — M961 Postlaminectomy syndrome, not elsewhere classified: Secondary | ICD-10-CM | POA: Diagnosis not present

## 2014-11-07 DIAGNOSIS — M5136 Other intervertebral disc degeneration, lumbar region: Secondary | ICD-10-CM | POA: Diagnosis not present

## 2014-11-07 DIAGNOSIS — M961 Postlaminectomy syndrome, not elsewhere classified: Secondary | ICD-10-CM | POA: Diagnosis not present

## 2014-11-24 DIAGNOSIS — M1712 Unilateral primary osteoarthritis, left knee: Secondary | ICD-10-CM | POA: Diagnosis not present

## 2014-12-02 ENCOUNTER — Ambulatory Visit (INDEPENDENT_AMBULATORY_CARE_PROVIDER_SITE_OTHER): Payer: Medicare Other | Admitting: Physician Assistant

## 2014-12-02 ENCOUNTER — Encounter: Payer: Self-pay | Admitting: Physician Assistant

## 2014-12-02 VITALS — BP 132/72 | HR 60 | Temp 97.7°F | Resp 16 | Wt 167.0 lb

## 2014-12-02 DIAGNOSIS — Z79899 Other long term (current) drug therapy: Secondary | ICD-10-CM | POA: Diagnosis not present

## 2014-12-02 DIAGNOSIS — E1121 Type 2 diabetes mellitus with diabetic nephropathy: Secondary | ICD-10-CM | POA: Diagnosis not present

## 2014-12-02 DIAGNOSIS — Z9181 History of falling: Secondary | ICD-10-CM

## 2014-12-02 DIAGNOSIS — E782 Mixed hyperlipidemia: Secondary | ICD-10-CM | POA: Diagnosis not present

## 2014-12-02 DIAGNOSIS — Z1331 Encounter for screening for depression: Secondary | ICD-10-CM

## 2014-12-02 DIAGNOSIS — R6889 Other general symptoms and signs: Secondary | ICD-10-CM

## 2014-12-02 DIAGNOSIS — Z0001 Encounter for general adult medical examination with abnormal findings: Secondary | ICD-10-CM

## 2014-12-02 DIAGNOSIS — E559 Vitamin D deficiency, unspecified: Secondary | ICD-10-CM | POA: Diagnosis not present

## 2014-12-02 DIAGNOSIS — I10 Essential (primary) hypertension: Secondary | ICD-10-CM

## 2014-12-02 DIAGNOSIS — E1129 Type 2 diabetes mellitus with other diabetic kidney complication: Secondary | ICD-10-CM | POA: Diagnosis not present

## 2014-12-02 DIAGNOSIS — E669 Obesity, unspecified: Secondary | ICD-10-CM

## 2014-12-02 LAB — CBC WITH DIFFERENTIAL/PLATELET
BASOS PCT: 0 % (ref 0–1)
Basophils Absolute: 0 10*3/uL (ref 0.0–0.1)
Eosinophils Absolute: 0.2 10*3/uL (ref 0.0–0.7)
Eosinophils Relative: 3 % (ref 0–5)
HCT: 42.1 % (ref 36.0–46.0)
Hemoglobin: 14.2 g/dL (ref 12.0–15.0)
LYMPHS ABS: 1.6 10*3/uL (ref 0.7–4.0)
LYMPHS PCT: 28 % (ref 12–46)
MCH: 31.7 pg (ref 26.0–34.0)
MCHC: 33.7 g/dL (ref 30.0–36.0)
MCV: 94 fL (ref 78.0–100.0)
MPV: 10.4 fL (ref 8.6–12.4)
Monocytes Absolute: 0.7 10*3/uL (ref 0.1–1.0)
Monocytes Relative: 12 % (ref 3–12)
NEUTROS PCT: 57 % (ref 43–77)
Neutro Abs: 3.2 10*3/uL (ref 1.7–7.7)
PLATELETS: 186 10*3/uL (ref 150–400)
RBC: 4.48 MIL/uL (ref 3.87–5.11)
RDW: 14.3 % (ref 11.5–15.5)
WBC: 5.6 10*3/uL (ref 4.0–10.5)

## 2014-12-02 NOTE — Progress Notes (Signed)
Assessment:   1. Unspecified essential hypertension - CBC with Differential - BASIC METABOLIC PANEL WITH GFR - Hepatic function panel - TSH  2. T2 NIDDM Discussed general issues about diabetes pathophysiology and management., Educational material distributed., Suggested low cholesterol diet., Encouraged aerobic exercise., Discussed foot care., Reminded to get yearly retinal exam. Unable to do enough urine today for culture and microalbumin, will check next visit.  - Hemoglobin A1c - Insulin, fasting  3. Mixed hyperlipidemia - Lipid panel  4. Vitamin D Deficiency  5. Encounter for long-term (current) use of other medications - Magnesium  6. Dizziness-  Normal neuro exam, RRR, CTAB Decrease atenolol to 1/2 pill daily If any changes in vision/speech/CP/SOB go to the ER    Plan:   During the course of the visit the patient was educated and counseled about appropriate screening and preventive services including:    Pneumococcal vaccine   Influenza vaccine  Td vaccine  Screening electrocardiogram  Screening mammography  Bone densitometry screening  Colorectal cancer screening  Diabetes screening  Glaucoma screening  Nutrition counseling   Conditions/risks identified: BMI: Discussed weight loss, diet, and increase physical activity.  Increase physical activity: AHA recommends 150 minutes of physical activity a week.  Medications reviewed DEXA Diabetes is at goal, ACE/ARB therapy: Yes. Urinary Incontinence is not an issue: discussed non pharmacology and pharmacology options.  Fall risk: moderate- discussed PT, home fall assessment, medications.   Subjective:   Debra Collins is a 79 y.o. female who presents for Medicare Annual Wellness Visit and 3 month follow up on hypertension, diabetes, hyperlipidemia, vitamin D def.  Date of last medicare wellness visit was 10/25/2013   Her blood pressure has been controlled at home, today their BP is BP: 132/72  mmHg She does not workout, she works out in her yard.  She denies chest pain, shortness of breath, dizziness.  She is on cholesterol medication and denies myalgias. Her cholesterol is at goal. The cholesterol last visit was:   Lab Results  Component Value Date   CHOL 165 08/16/2014   HDL 41 08/16/2014   LDLCALC 92 08/16/2014   TRIG 158* 08/16/2014   CHOLHDL 4.0 08/16/2014   She has been working on diet and exercise for diabetes with CKD controlled with metfomrin and diet/exercise, and she has not been checking her sugars lately, she is on bASA, she is on ACE, denies paresthesia of the feet, polydipsia and polyuria. Last A1C in the office was:  Lab Results  Component Value Date   HGBA1C 6.1* 08/16/2014   Patient is on Vitamin D supplement. She had elevated LFTs 3 months ago Lab Results  Component Value Date   ALT 55* 08/16/2014   AST 42* 08/16/2014   ALKPHOS 47 08/16/2014   BILITOT 0.9 08/16/2014   30 years ago s/p double mastectomy.  Continuing left leg pain after history of lower back surgery.  BMI is Body mass index is 29.59 kg/(m^2)., she is working on diet and exercise. Wt Readings from Last 3 Encounters:  12/02/14 167 lb (75.751 kg)  08/22/14 170 lb (77.111 kg)  08/16/14 170 lb (77.111 kg)   She states she has a nodule on her back x 3-6 months, thought it was from her showers but it is staying and not going away.  She has felt dizzy with bending over, and states she feels off balance for the last 3 months. For example, she was planting in her yard yesterday and felt off balance, she denies any accompaniments without vision  changes, SOB, CP, etc. Sits for 5-10 mins and feels better.  Medication Review Current Outpatient Prescriptions on File Prior to Visit  Medication Sig Dispense Refill  . aspirin 81 MG tablet Take 81 mg by mouth daily.    Marland Kitchen atenolol (TENORMIN) 100 MG tablet Take 1 tablet by mouth  daily 90 tablet 99  . atorvastatin (LIPITOR) 80 MG tablet Take 1 tablet by  mouth  every day for cholesterol 90 tablet 1  . Calcium Citrate (CALCITRATE PO) Take 500 mg by mouth daily.    . Cholecalciferol (VITAMIN D PO) Take 2,000 Units by mouth 3 (three) times daily.    Marland Kitchen glucose blood test strip Check glucose daily 100 each 99  . HYDROcodone-acetaminophen (NORCO) 5-325 MG per tablet As directed 50 tablet 0  . lisinopril (PRINIVIL,ZESTRIL) 20 MG tablet Take 1 tablet by mouth  every evening for blood  pressure 90 tablet 1  . meloxicam (MOBIC) 15 MG tablet Take 1 tablet by mouth  daily as needed for pain  and inflammation 90 tablet 99  . metFORMIN (GLUCOPHAGE-XR) 500 MG 24 hr tablet Take 1 tablet twice daily  with breakfast and lunch  and take 2 tablets with  supper for diabetes 360 tablet 1  . MILK THISTLE PO Take by mouth 2 (two) times daily.    . Multiple Vitamin (MULTIVITAMIN) tablet Take 1 tablet by mouth daily.    Marland Kitchen omeprazole (PRILOSEC) 20 MG capsule Take 1 capsule by mouth  every day for GERD 90 capsule 1  . verapamil (CALAN) 120 MG tablet Take 1 tablet by mouth  once daily    . vitamin B-12 (CYANOCOBALAMIN) 1000 MCG tablet Take 1,000 mcg by mouth daily.     No current facility-administered medications on file prior to visit.    Current Problems (verified) Patient Active Problem List   Diagnosis Date Noted  . Obesity 12/02/2014  . T2_NIDDM w/CKD 08/16/2014  . Encounter for long-term (current) use of medications 01/23/2014  . Essential hypertension 07/25/2013  . Mixed hyperlipidemia 07/25/2013  . Vitamin D deficiency 07/25/2013    Screening Tests Health Maintenance  Topic Date Due  . DEXA SCAN  03/12/2003  . INFLUENZA VACCINE  02/13/2015  . HEMOGLOBIN A1C  02/14/2015  . OPHTHALMOLOGY EXAM  07/21/2015  . FOOT EXAM  08/17/2015  . URINE MICROALBUMIN  08/17/2015  . COLONOSCOPY  01/19/2018  . TETANUS/TDAP  07/09/2021  . ZOSTAVAX  Completed  . PNA vac Low Risk Adult  Completed     Immunization History  Administered Date(s) Administered  . H1N1  06/28/2008  . Influenza Whole 05/04/2013  . Influenza, High Dose Seasonal PF 04/26/2014  . Pneumococcal Conjugate-13 04/26/2014  . Pneumococcal Polysaccharide-23 07/10/2011  . Td 07/10/2011  . Zoster 06/28/2008    Preventative care: Last colonoscopy: 2009 Last mammogram: 2003 s/p double mastectomy Last pap smear/pelvic exam: remote DEXA - unknown Ct pelvis/AB 2005 CXR 2012 [place] AB 2014   Prior vaccinations: TD or Tdap: 2012  Influenza: 2014 Pneumococcal: 2012 Prevnar 13: 2015 Shingles/Zostavax: 2009  Names of Other Physician/Practitioners you currently use: 1. [facility]- here for primary care 2. Dr. Delman Cheadle, eye doctor, last visit 07/2014 3. Dr. Lavonna Rua, dentist, last visit q 6 months Patient Care Team: Unk Pinto, MD as PCP - General (Internal Medicine) Melissa Noon, OD (Optometry)  Allergies Allergies  Allergen Reactions  . Diltiazem    Surgical history Past Surgical History  Procedure Laterality Date  . Knee arthroscopy Left   .  Appendectomy    . Brain surgery     Family history Family History  Problem Relation Age of Onset  . Stroke Mother   . Heart disease Mother   . Heart disease Father   . Heart attack Father    Risk Factors: Osteoporosis: postmenopausal estrogen deficiency and dietary calcium and/or vitamin D deficiency History of fracture in the past year: no  Tobacco History  Substance Use Topics  . Smoking status: Former Smoker    Quit date: 07/15/1993  . Smokeless tobacco: Not on file  . Alcohol Use: 2.5 oz/week    5 drink(s) per week   She does not smoke.  Patient is a former smoker. Are there smokers in your home (other than you)?  No  Alcohol Current alcohol use: social drinker  Caffeine Current caffeine use: denies use  Exercise Exercise limitations: The patient has no exercise limitations. Current exercise: none, works out in the yard  Nutrition/Diet Current diet: in general, a  "healthy" diet    Cardiac risk factors: advanced age (older than 50 for men, 52 for women), diabetes mellitus, dyslipidemia, hypertension, obesity (BMI >= 30 kg/m2) and sedentary lifestyle.  Depression Screen (Note: if answer to either of the following is "Yes", a more complete depression screening is indicated)   Q1: Over the past two weeks, have you felt down, depressed or hopeless? No  Q2: Over the past two weeks, have you felt little interest or pleasure in doing things? No  Have you lost interest or pleasure in daily life? No  Do you often feel hopeless? No  Do you cry easily over simple problems? No  Activities of Daily Living In your present state of health, do you have any difficulty performing the following activities?:  Driving? No Managing money?  No Feeding yourself? No Getting from bed to chair? No Climbing a flight of stairs? No Preparing food and eating?: No Bathing or showering? No Getting dressed: No Getting to the toilet? No Using the toilet:No Moving around from place to place: No In the past year have you fallen or had a near fall?:No   Are you sexually active?  No  Do you have more than one partner?  No  Vision Difficulties: No  Hearing Difficulties: No Do you often ask people to speak up or repeat themselves? No Do you experience ringing or noises in your ears? No Do you have difficulty understanding soft or whispered voices? No  Cognition  Do you feel that you have a problem with memory?No  Do you often misplace items? No  Do you feel safe at home?  Yes  Advanced directives Does patient have a [place]? Yes Does patient have a Living Will? Yes    Objective:   Blood pressure 132/72, pulse 60, temperature 97.7 F (36.5 C), resp. rate 16, weight 167 lb (75.751 kg). Body mass index is 29.59 kg/(m^2).  General appearance: alert, no distress, WD/WN,  female Cognitive Testing  Alert? Yes  Normal Appearance?Yes  Oriented to  person? Yes  Place? Yes   Time? Yes  Recall of three objects?  Yes  Can perform simple calculations? Yes  Displays appropriate judgment?Yes  Can read the correct time from a watch face?Yes  HEENT: normocephalic, sclerae anicteric, TMs pearly, nares patent, no discharge or erythema, pharynx normal Oral cavity: MMM, no lesions Neck: supple, no lymphadenopathy, no thyromegaly, no masses Heart: RRR, normal S1, S2, no murmurs Lungs: CTA bilaterally, no wheezes, rhonchi, or rales Abdomen: +bs,  soft, non tender, non distended, no masses, no hepatomegaly, no splenomegaly Musculoskeletal: nontender, no swelling, no obvious deformity Extremities: no edema, no cyanosis, no clubbing Pulses: 2+ symmetric, upper and lower extremities, normal cap refill Neurological: alert, oriented x 3, CN2-12 intact, strength normal upper extremities and lower extremities, sensation normal throughout, DTRs 2+ throughout, no cerebellar signs, gait normal Psychiatric: normal affect, behavior normal, pleasant  Skin: upper back with blanchable superficial erythematous telangiectasias/vasculitis along bilateral scapula. Breast: defer Gyn: defer Rectal: defer   Medicare Attestation I have personally reviewed: The patient's medical and social history Their use of alcohol, tobacco or illicit drugs Their current medications and supplements The patient's functional ability including ADLs,fall risks, home safety risks, cognitive, and hearing and visual impairment Diet and physical activities Evidence for depression or mood disorders  The patient's weight, height, BMI, and visual acuity have been recorded in the chart.  I have made referrals, counseling, and provided education to the patient based on review of the above and I have provided the patient with a written personalized care plan for preventive services.     Vicie Mutters, PA-C   12/02/2014

## 2014-12-02 NOTE — Patient Instructions (Signed)
Please try to cut the atenolol to 1/2 pill at night to see if this helps with the dizziness.  if worsening HA, changes vision/speech, imbalance, weakness go to the ER  Dizziness Dizziness is a common problem. It is a feeling of unsteadiness or light-headedness. You may feel like you are about to faint. Dizziness can lead to injury if you stumble or fall. A person of any age group can suffer from dizziness, but dizziness is more common in older adults. CAUSES  Dizziness can be caused by many different things, including:  Middle ear problems.  Standing for too long.  Infections.  An allergic reaction.  Aging.  An emotional response to something, such as the sight of blood.  Side effects of medicines.  Tiredness.  Problems with circulation or blood pressure.  Excessive use of alcohol or medicines, or illegal drug use.  Breathing too fast (hyperventilation).  An irregular heart rhythm (arrhythmia).  A low red blood cell count (anemia).  Pregnancy.  Vomiting, diarrhea, fever, or other illnesses that cause body fluid loss (dehydration).  Diseases or conditions such as Parkinson's disease, high blood pressure (hypertension), diabetes, and thyroid problems.  Exposure to extreme heat. DIAGNOSIS  Your health care provider will ask about your symptoms, perform a physical exam, and perform an electrocardiogram (ECG) to record the electrical activity of your heart. Your health care provider may also perform other heart or blood tests to determine the cause of your dizziness. These may include:  Transthoracic echocardiogram (TTE). During echocardiography, sound waves are used to evaluate how blood flows through your heart.  Transesophageal echocardiogram (TEE).  Cardiac monitoring. This allows your health care provider to monitor your heart rate and rhythm in real time.  Holter monitor. This is a portable device that records your heartbeat and can help diagnose heart arrhythmias.  It allows your health care provider to track your heart activity for several days if needed.  Stress tests by exercise or by giving medicine that makes the heart beat faster. TREATMENT  Treatment of dizziness depends on the cause of your symptoms and can vary greatly. HOME CARE INSTRUCTIONS   Drink enough fluids to keep your urine clear or pale yellow. This is especially important in very hot weather. In older adults, it is also important in cold weather.  Take your medicine exactly as directed if your dizziness is caused by medicines. When taking blood pressure medicines, it is especially important to get up slowly.  Rise slowly from chairs and steady yourself until you feel okay.  In the morning, first sit up on the side of the bed. When you feel okay, stand slowly while holding onto something until you know your balance is fine.  Move your legs often if you need to stand in one place for a long time. Tighten and relax your muscles in your legs while standing.  Have someone stay with you for 1-2 days if dizziness continues to be a problem. Do this until you feel you are well enough to stay alone. Have the person call your health care provider if he or she notices changes in you that are concerning.  Do not drive or use heavy machinery if you feel dizzy.  Do not drink alcohol. SEEK IMMEDIATE MEDICAL CARE IF:   Your dizziness or light-headedness gets worse.  You feel nauseous or vomit.  You have problems talking, walking, or using your arms, hands, or legs.  You feel weak.  You are not thinking clearly or you have trouble  forming sentences. It may take a friend or family member to notice this.  You have chest pain, abdominal pain, shortness of breath, or sweating.  Your vision changes.  You notice any bleeding.  You have side effects from medicine that seems to be getting worse rather than better. MAKE SURE YOU:   Understand these instructions.  Will watch your  condition.  Will get help right away if you are not doing well or get worse. Document Released: 12/25/2000 Document Revised: 07/06/2013 Document Reviewed: 01/18/2011 [facility] Patient Information 2015 [place], [place]. This information is not intended to replace advice given to you by your health care provider. Make sure you discuss any questions you have with your health care provider.   Benign Paroxysmal Positional Vertigo (BPPV)  General Information In Benign Paroxysmal Positional Vertigo (BPPV) dizziness is generally thought to be due to debris which has collected within a part of the inner ear. This debris can be thought of as "ear rocks", although the formal name is "otoconia". Ear rocks are small crystals of calcium carbonate derived from a structure in the ear called the "utricle" (figure to the right ). The symptoms of BPPV include dizziness or vertigo, lightheadedness, imbalance, and nausea. Activities which bring on symptoms will vary among persons, but symptoms are usually followed by a change of position of the head like getting out of bed or rolling over in bed are common "problem" motions .  However if you have worsening HA, changes vision/speech, weakness go to the ER     What can be done? 1) Use two or more pillows at night. Avoid sleeping on the "bad" side. In the morning, get up slowly and sit on the edge of the bed for a minute. 2) Medication prescribed by your doctor.  3) The exercises below, you can do at home to help you prevent the sensation later in the day.  4) We can refer you to physical therapy at [facility], # [place] treatments: The Brandt-Daroff Exercises are a home method of treating BPPV,and are effective 95% of the time.  These exercises are performed in three sets per day for two weeks. In each set, one performs the maneuver as shown five times. Start sitting upright (position 1). Then move into the side-lying position (position  2), with the head angled upward about halfway. An easy way to remember this is to imagine someone standing about 6 feet in front of you, and just keep looking at their head at all times. Stay in the side-lying position for 30 seconds, or until the dizziness subsides if this is longer, then go back to the sitting position (position 3). Stay there for 30 seconds, and then go to the opposite side (position 4) and follow the same routine.  At home Epley Maneuver This procedure seems to be even more effective than the in-office procedure, perhaps because it is repeated every night for a week.  The method (for the left side) is performed as shown on the figure below.  1) One stays in each of the supine (lying down) positions for 30 seconds, and in the sitting upright position (top) for 1 minute.  2) Thus, once cycle takes 2 1/2 minutes.  3) Typically 3 cycles are performed just prior to going to sleep.  4) It is best to do them at night rather than in the morning or midday, as if one becomes dizzy following the exercises, then it can resolve while one is sleeping.  The mirror image of this procedure is used for the right ear.

## 2014-12-03 LAB — HEPATIC FUNCTION PANEL
ALBUMIN: 4.5 g/dL (ref 3.5–5.2)
ALT: 69 U/L — AB (ref 0–35)
AST: 52 U/L — ABNORMAL HIGH (ref 0–37)
Alkaline Phosphatase: 48 U/L (ref 39–117)
Bilirubin, Direct: 0.2 mg/dL (ref 0.0–0.3)
Indirect Bilirubin: 0.8 mg/dL (ref 0.2–1.2)
TOTAL PROTEIN: 6.9 g/dL (ref 6.0–8.3)
Total Bilirubin: 1 mg/dL (ref 0.2–1.2)

## 2014-12-03 LAB — LIPID PANEL
CHOLESTEROL: 151 mg/dL (ref 0–200)
HDL: 38 mg/dL — ABNORMAL LOW (ref >=46)
LDL CALC: 87 mg/dL (ref 0–99)
Total CHOL/HDL Ratio: 4 Ratio
Triglycerides: 132 mg/dL (ref <150)
VLDL: 26 mg/dL (ref 0–40)

## 2014-12-03 LAB — MAGNESIUM: Magnesium: 2 mg/dL (ref 1.5–2.5)

## 2014-12-03 LAB — BASIC METABOLIC PANEL WITH GFR
BUN: 25 mg/dL — ABNORMAL HIGH (ref 6–23)
CHLORIDE: 101 meq/L (ref 96–112)
CO2: 24 meq/L (ref 19–32)
CREATININE: 0.69 mg/dL (ref 0.50–1.10)
Calcium: 9.3 mg/dL (ref 8.4–10.5)
GFR, EST NON AFRICAN AMERICAN: 85 mL/min
GFR, Est African American: >89 mL/min
Glucose, Bld: 94 mg/dL (ref 70–99)
Potassium: 4.5 mEq/L (ref 3.5–5.3)
SODIUM: 138 meq/L (ref 135–145)

## 2014-12-03 LAB — TSH: TSH: 3.87 u[IU]/mL (ref 0.350–4.500)

## 2014-12-03 LAB — INSULIN, FASTING: INSULIN FASTING, SERUM: 10.2 u[IU]/mL (ref 2.0–19.6)

## 2014-12-03 LAB — HEMOGLOBIN A1C
Hgb A1c MFr Bld: 6.4 % — ABNORMAL HIGH (ref <5.7)
MEAN PLASMA GLUCOSE: 137 mg/dL — AB (ref <117)

## 2014-12-29 DIAGNOSIS — M1712 Unilateral primary osteoarthritis, left knee: Secondary | ICD-10-CM | POA: Diagnosis not present

## 2015-01-05 DIAGNOSIS — M1712 Unilateral primary osteoarthritis, left knee: Secondary | ICD-10-CM | POA: Diagnosis not present

## 2015-01-12 DIAGNOSIS — M1712 Unilateral primary osteoarthritis, left knee: Secondary | ICD-10-CM | POA: Diagnosis not present

## 2015-02-20 ENCOUNTER — Other Ambulatory Visit: Payer: Self-pay | Admitting: Internal Medicine

## 2015-03-09 ENCOUNTER — Encounter: Payer: Self-pay | Admitting: Internal Medicine

## 2015-03-09 ENCOUNTER — Ambulatory Visit (INDEPENDENT_AMBULATORY_CARE_PROVIDER_SITE_OTHER): Payer: Medicare Other | Admitting: Internal Medicine

## 2015-03-09 VITALS — BP 136/76 | HR 72 | Temp 97.7°F | Resp 16 | Ht 63.0 in | Wt 163.8 lb

## 2015-03-09 DIAGNOSIS — N189 Chronic kidney disease, unspecified: Secondary | ICD-10-CM | POA: Diagnosis not present

## 2015-03-09 DIAGNOSIS — Z6829 Body mass index (BMI) 29.0-29.9, adult: Secondary | ICD-10-CM

## 2015-03-09 DIAGNOSIS — E1129 Type 2 diabetes mellitus with other diabetic kidney complication: Secondary | ICD-10-CM | POA: Diagnosis not present

## 2015-03-09 DIAGNOSIS — E1122 Type 2 diabetes mellitus with diabetic chronic kidney disease: Secondary | ICD-10-CM

## 2015-03-09 DIAGNOSIS — E559 Vitamin D deficiency, unspecified: Secondary | ICD-10-CM

## 2015-03-09 DIAGNOSIS — L720 Epidermal cyst: Secondary | ICD-10-CM | POA: Diagnosis not present

## 2015-03-09 DIAGNOSIS — I10 Essential (primary) hypertension: Secondary | ICD-10-CM

## 2015-03-09 DIAGNOSIS — E782 Mixed hyperlipidemia: Secondary | ICD-10-CM

## 2015-03-09 DIAGNOSIS — E669 Obesity, unspecified: Secondary | ICD-10-CM | POA: Diagnosis not present

## 2015-03-09 DIAGNOSIS — Z79899 Other long term (current) drug therapy: Secondary | ICD-10-CM | POA: Diagnosis not present

## 2015-03-09 DIAGNOSIS — E1121 Type 2 diabetes mellitus with diabetic nephropathy: Secondary | ICD-10-CM | POA: Diagnosis not present

## 2015-03-09 DIAGNOSIS — Z0000 Encounter for general adult medical examination without abnormal findings: Secondary | ICD-10-CM | POA: Insufficient documentation

## 2015-03-09 LAB — CBC WITH DIFFERENTIAL/PLATELET
BASOS PCT: 0 % (ref 0–1)
Basophils Absolute: 0 10*3/uL (ref 0.0–0.1)
EOS ABS: 0.2 10*3/uL (ref 0.0–0.7)
EOS PCT: 3 % (ref 0–5)
HCT: 43.9 % (ref 36.0–46.0)
HEMOGLOBIN: 14.8 g/dL (ref 12.0–15.0)
Lymphocytes Relative: 29 % (ref 12–46)
Lymphs Abs: 1.7 10*3/uL (ref 0.7–4.0)
MCH: 32.1 pg (ref 26.0–34.0)
MCHC: 33.7 g/dL (ref 30.0–36.0)
MCV: 95.2 fL (ref 78.0–100.0)
MONO ABS: 0.6 10*3/uL (ref 0.1–1.0)
MONOS PCT: 11 % (ref 3–12)
MPV: 10.8 fL (ref 8.6–12.4)
NEUTROS ABS: 3.4 10*3/uL (ref 1.7–7.7)
Neutrophils Relative %: 57 % (ref 43–77)
Platelets: 188 10*3/uL (ref 150–400)
RBC: 4.61 MIL/uL (ref 3.87–5.11)
RDW: 14.3 % (ref 11.5–15.5)
WBC: 5.9 10*3/uL (ref 4.0–10.5)

## 2015-03-09 LAB — TSH: TSH: 2.934 u[IU]/mL (ref 0.350–4.500)

## 2015-03-09 LAB — LIPID PANEL
CHOLESTEROL: 166 mg/dL (ref 125–200)
HDL: 36 mg/dL — ABNORMAL LOW (ref >=46)
LDL Cholesterol: 89 mg/dL (ref <130)
TRIGLYCERIDES: 203 mg/dL — AB (ref <150)
Total CHOL/HDL Ratio: 4.6 Ratio (ref <=5.0)
VLDL: 41 mg/dL — ABNORMAL HIGH (ref <30)

## 2015-03-09 LAB — BASIC METABOLIC PANEL WITH GFR
BUN: 25 mg/dL (ref 7–25)
CALCIUM: 9.9 mg/dL (ref 8.6–10.4)
CO2: 26 mmol/L (ref 20–31)
Chloride: 100 mmol/L (ref 98–110)
Creat: 0.65 mg/dL (ref 0.60–0.93)
GFR, Est Non African American: 87 mL/min (ref >=60)
GLUCOSE: 94 mg/dL (ref 65–99)
Potassium: 4.1 mmol/L (ref 3.5–5.3)
SODIUM: 139 mmol/L (ref 135–146)

## 2015-03-09 LAB — MAGNESIUM: Magnesium: 1.8 mg/dL (ref 1.5–2.5)

## 2015-03-09 LAB — HEMOGLOBIN A1C
HEMOGLOBIN A1C: 6 % — AB (ref <5.7)
MEAN PLASMA GLUCOSE: 126 mg/dL — AB (ref <117)

## 2015-03-09 LAB — HEPATIC FUNCTION PANEL
ALT: 61 U/L — AB (ref 6–29)
AST: 44 U/L — ABNORMAL HIGH (ref 10–35)
Albumin: 4.8 g/dL (ref 3.6–5.1)
Alkaline Phosphatase: 50 U/L (ref 33–130)
BILIRUBIN DIRECT: 0.2 mg/dL (ref <=0.2)
Indirect Bilirubin: 1.2 mg/dL (ref 0.2–1.2)
Total Bilirubin: 1.4 mg/dL — ABNORMAL HIGH (ref 0.2–1.2)
Total Protein: 6.9 g/dL (ref 6.1–8.1)

## 2015-03-09 MED ORDER — PHENTERMINE HCL 37.5 MG PO TABS: ORAL_TABLET | ORAL | Status: DC

## 2015-03-09 NOTE — Patient Instructions (Signed)

## 2015-03-09 NOTE — Progress Notes (Signed)
Patient ID: Debra Collins, female   DOB: 04/21/1936, 79 y.o.   MRN: [id number]   This very nice 79 y.o.female presents for 3 month follow up with Hypertension, Hyperlipidemia, Pre-Diabetes and Vitamin D Deficiency. Patient c/o pains right thumb, index & long fingers occuring intermittently daily and with difficulty w/grip. Also c/o irritated cyst lateral to left eye.    Patient is treated for HTN & BP has been controlled at home. Today's BP: 136/76 mmHg. Patient has had no complaints of any cardiac type chest pain, palpitations, dyspnea/orthopnea/PND, dizziness, claudication, or dependent edema.   Hyperlipidemia is controlled with diet & meds. Patient denies myalgias or other med SE's. Last Lipids were at goal - Cholesterol 151; HDL 38*; LDL 87; Triglycerides 132 on 12/02/2014.   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  Last A1c was 6.4% on 12/02/2014.   Further, the patient also has history of Vitamin D Deficiency of 42 on treatment in 2009  and supplements vitamin D without any suspected side-effects. Last vitamin D was  56 on 08/16/2014  Medication Sig  . aspirin 81 MG tablet Take 81 mg by mouth daily.  Marland Kitchen atenolol (TENORMIN) 100 MG tablet Take 1 tablet by mouth  daily  . atorvastatin (LIPITOR) 80 MG tablet Take 1 tablet by mouth  every day for cholesterol  . Calcium Citrate (CALCITRATE PO) Take 500 mg by mouth daily.  . Cholecalciferol (VITAMIN D PO) Take 2,000 Units by mouth 3 (three) times daily.  Marland Kitchen glucose blood test strip Check glucose daily  . HYDROcodone-acetaminophen (NORCO) 5-325 MG per tablet As directed  . lisinopril (PRINIVIL,ZESTRIL) 20 MG tablet Take 1 tablet by mouth  every evening for blood  pressure  . metFORMIN (GLUCOPHAGE-XR) 500 MG 24 hr tablet Take 1 tablet twice daily  with breakfast and lunch  and take 2 tablets with  supper for diabetes  . MILK THISTLE  PO Take by mouth 2 (two) times daily.  . Multiple Vitamin (MULTIVITAMIN) tablet Take 1 tablet by mouth daily.  Marland Kitchen omeprazole (PRILOSEC) 20 MG capsule Take 1 capsule by mouth  every day for GERD  . verapamil (CALAN) 120 MG tablet Take 1 tablet by mouth  twice a day  . vitamin B-12 (CYANOCOBALAMIN) 1000 MCG tablet Take 1,000 mcg by mouth daily.   Allergies  Allergen Reactions  . Diltiazem    PMHx:   Past Medical History  Diagnosis Date  . Hypertension   . Hyperlipidemia   . Vitamin D deficiency   . DDD (degenerative disc disease), lumbar   . Type II or unspecified type diabetes mellitus without mention of complication, not stated as uncontrolled   . Fatty liver disease, nonalcoholic     CT AB 8144   Immunization History  Administered Date(s) Administered  . H1N1 06/28/2008  . Influenza Whole 05/04/2013  . Influenza, High Dose Seasonal PF 04/26/2014  . Pneumococcal Conjugate-13 04/26/2014  . Pneumococcal Polysaccharide-23 07/10/2011  . Td 07/10/2011  . Zoster 06/28/2008   Past Surgical History  Procedure Laterality Date  . Knee arthroscopy Left   . Appendectomy    . Brain surgery    . Mastectomy Bilateral     30 years ago   FHx:    Reviewed / unchanged  SHx:    Reviewed / unchanged  Systems Review:  Constitutional: Denies fever, chills, wt changes, headaches, insomnia, fatigue, night sweats, change in appetite.  Eyes: Denies redness, blurred vision, diplopia, discharge, itchy, watery eyes.  ENT: Denies discharge, congestion, post nasal drip, epistaxis, sore throat, earache, hearing loss, dental pain, tinnitus, vertigo, sinus pain, snoring.  CV: Denies chest pain, palpitations, irregular heartbeat, syncope, dyspnea, diaphoresis, orthopnea, PND, claudication or edema. Respiratory: denies cough, dyspnea, DOE, pleurisy, hoarseness, laryngitis, wheezing.  Gastrointestinal: Denies dysphagia, odynophagia, heartburn, reflux, water brash, abdominal pain or cramps, nausea, vomiting,  bloating, diarrhea, constipation, hematemesis, melena, hematochezia  or hemorrhoids. Genitourinary: Denies dysuria, frequency, urgency, nocturia, hesitancy, discharge, hematuria or flank pain. Musculoskeletal: Denies arthralgias, myalgias, stiffness, jt. swelling, pain, limping or strain/sprain.  Skin: Denies pruritus, rash, hives, warts, acne, eczema or change in skin lesion(s). Neuro: No weakness, tremor, incoordination, spasms, paresthesia or pain. Psychiatric: Denies confusion, memory loss or sensory loss. Endo: Denies change in weight, skin or hair change.  Heme/Lymph: No excessive bleeding, bruising or enlarged lymph nodes.  Physical Exam  BP 136/76 mmHg  Pulse 72  Temp(Src) 97.7 F (36.5 C)  Resp 16  Ht 5\' 3"  (1.6 m)  Wt 163 lb 12.8 oz (74.299 kg)  BMI 29.02 kg/m2  Appears well nourished and in no distress. Eyes: PERRLA, EOMs, conjunctiva no swelling or erythema. Sinuses: No frontal/maxillary tenderness ENT/Mouth: EAC's clear, TM's nl w/o erythema, bulging. Nares clear w/o erythema, swelling, exudates. Oropharynx clear without erythema or exudates. Oral hygiene is good. Tongue normal, non obstructing. Hearing intact.  Neck: Supple. Thyroid nl. Car 2+/2+ without bruits, nodes or JVD. Chest: Respirations nl with BS clear & equal w/o rales, rhonchi, wheezing or stridor.  Cor: Heart sounds normal w/ regular rate and rhythm without sig. murmurs, gallops, clicks, or rubs. Peripheral pulses normal and equal  without edema.  Abdomen: Soft & bowel sounds normal. Non-tender w/o guarding, rebound, hernias, masses, or organomegaly.  Lymphatics: Unremarkable.  Musculoskeletal: Full ROM all peripheral extremities, joint stability, 5/5 strength, and normal gait.   Neuro: Cranial nerves intact, reflexes equal bilaterally. Sensory-motor testing grossly intact. Tendon reflexes grossly intact. (+) tinel's Right wrist.  Pysch: Alert & oriented x 3.  Insight and judgement nl & appropriate. No  ideations.  Skin: Warm, dry without exposed rashes, lesions or ecchymosis apparent.   Procedure (CPT - 99774): There is a 3 mm pearly white cyst approx 1/2" lateral to the left eye. After informed consent, alcohol prep and anesthesia with 0.3 ml of xylocaine 1%,  the area was incised with a # 11 blade and a firm smooth white cyst was sharply excised and delivered. Wound was dressed with a sterile bandage. Patient was instructed in wound care.   Assessment and Plan:  1. Essential hypertension  - TSH  2. Mixed hyperlipidemia  - Lipid panel  3. T2_NIDDM w/CKD  - Hemoglobin A1c - Insulin, random  4. Vitamin D deficiency  - Vit D  25 hydroxy (rtn osteoporosis monitoring)  5. Obesity   6. Encounter for long-term (current) use of medications  - CBC with Differential/Platelet - BASIC METABOLIC PANEL WITH GFR - Hepatic function panel - Magnesium  7. Inclusion/sebaceous Cyst left face    Recommended regular exercise, BP monitoring, weight control, and discussed med and SE's. Recommended labs to assess and monitor clinical status. Further disposition pending results of labs. Over 30 minutes of exam, counseling, chart review was performed

## 2015-03-10 LAB — INSULIN, RANDOM: Insulin: 14.3 u[IU]/mL (ref 2.0–19.6)

## 2015-03-10 LAB — VITAMIN D 25 HYDROXY (VIT D DEFICIENCY, FRACTURES): Vit D, 25-Hydroxy: 55 ng/mL (ref 30–100)

## 2015-04-26 ENCOUNTER — Ambulatory Visit (INDEPENDENT_AMBULATORY_CARE_PROVIDER_SITE_OTHER): Payer: Medicare Other

## 2015-04-26 DIAGNOSIS — Z23 Encounter for immunization: Secondary | ICD-10-CM | POA: Diagnosis not present

## 2015-05-15 ENCOUNTER — Other Ambulatory Visit: Payer: Self-pay | Admitting: Internal Medicine

## 2015-06-14 ENCOUNTER — Encounter: Payer: Self-pay | Admitting: Physician Assistant

## 2015-06-14 ENCOUNTER — Ambulatory Visit (INDEPENDENT_AMBULATORY_CARE_PROVIDER_SITE_OTHER): Payer: Medicare Other | Admitting: Physician Assistant

## 2015-06-14 VITALS — BP 126/80 | HR 68 | Temp 97.5°F | Resp 14 | Ht 63.0 in | Wt 159.0 lb

## 2015-06-14 DIAGNOSIS — E1129 Type 2 diabetes mellitus with other diabetic kidney complication: Secondary | ICD-10-CM | POA: Diagnosis not present

## 2015-06-14 DIAGNOSIS — E1121 Type 2 diabetes mellitus with diabetic nephropathy: Secondary | ICD-10-CM | POA: Diagnosis not present

## 2015-06-14 DIAGNOSIS — E669 Obesity, unspecified: Secondary | ICD-10-CM | POA: Diagnosis not present

## 2015-06-14 DIAGNOSIS — M5432 Sciatica, left side: Secondary | ICD-10-CM

## 2015-06-14 DIAGNOSIS — E559 Vitamin D deficiency, unspecified: Secondary | ICD-10-CM | POA: Diagnosis not present

## 2015-06-14 DIAGNOSIS — Z79899 Other long term (current) drug therapy: Secondary | ICD-10-CM | POA: Diagnosis not present

## 2015-06-14 DIAGNOSIS — I10 Essential (primary) hypertension: Secondary | ICD-10-CM

## 2015-06-14 DIAGNOSIS — E782 Mixed hyperlipidemia: Secondary | ICD-10-CM

## 2015-06-14 LAB — HEPATIC FUNCTION PANEL
ALBUMIN: 4.2 g/dL (ref 3.6–5.1)
ALK PHOS: 44 U/L (ref 33–130)
ALT: 38 U/L — ABNORMAL HIGH (ref 6–29)
AST: 27 U/L (ref 10–35)
BILIRUBIN DIRECT: 0.2 mg/dL (ref <=0.2)
BILIRUBIN INDIRECT: 0.7 mg/dL (ref 0.2–1.2)
BILIRUBIN TOTAL: 0.9 mg/dL (ref 0.2–1.2)
Total Protein: 6.6 g/dL (ref 6.1–8.1)

## 2015-06-14 LAB — LIPID PANEL
CHOL/HDL RATIO: 3.8 ratio (ref <=5.0)
CHOLESTEROL: 138 mg/dL (ref 125–200)
HDL: 36 mg/dL — AB (ref >=46)
LDL Cholesterol: 75 mg/dL (ref <130)
TRIGLYCERIDES: 133 mg/dL (ref <150)
VLDL: 27 mg/dL (ref <30)

## 2015-06-14 LAB — CBC WITH DIFFERENTIAL/PLATELET
BASOS ABS: 0 10*3/uL (ref 0.0–0.1)
BASOS PCT: 0 % (ref 0–1)
EOS ABS: 0.2 10*3/uL (ref 0.0–0.7)
Eosinophils Relative: 2 % (ref 0–5)
HCT: 40.8 % (ref 36.0–46.0)
Hemoglobin: 13.9 g/dL (ref 12.0–15.0)
Lymphocytes Relative: 21 % (ref 12–46)
Lymphs Abs: 1.7 10*3/uL (ref 0.7–4.0)
MCH: 31.8 pg (ref 26.0–34.0)
MCHC: 34.1 g/dL (ref 30.0–36.0)
MCV: 93.4 fL (ref 78.0–100.0)
MPV: 10.8 fL (ref 8.6–12.4)
Monocytes Absolute: 0.7 10*3/uL (ref 0.1–1.0)
Monocytes Relative: 9 % (ref 3–12)
NEUTROS ABS: 5.5 10*3/uL (ref 1.7–7.7)
NEUTROS PCT: 68 % (ref 43–77)
PLATELETS: 189 10*3/uL (ref 150–400)
RBC: 4.37 MIL/uL (ref 3.87–5.11)
RDW: 14.1 % (ref 11.5–15.5)
WBC: 8.1 10*3/uL (ref 4.0–10.5)

## 2015-06-14 LAB — HEMOGLOBIN A1C
Hgb A1c MFr Bld: 5.8 % — ABNORMAL HIGH (ref <5.7)
MEAN PLASMA GLUCOSE: 120 mg/dL — AB (ref <117)

## 2015-06-14 LAB — BASIC METABOLIC PANEL WITH GFR
BUN: 19 mg/dL (ref 7–25)
CHLORIDE: 101 mmol/L (ref 98–110)
CO2: 26 mmol/L (ref 20–31)
CREATININE: 0.55 mg/dL — AB (ref 0.60–0.93)
Calcium: 9.4 mg/dL (ref 8.6–10.4)
GFR, Est African American: >89 mL/min (ref >=60)
Glucose, Bld: 101 mg/dL — ABNORMAL HIGH (ref 65–99)
POTASSIUM: 4.6 mmol/L (ref 3.5–5.3)
SODIUM: 139 mmol/L (ref 135–146)

## 2015-06-14 LAB — MAGNESIUM: Magnesium: 1.8 mg/dL (ref 1.5–2.5)

## 2015-06-14 LAB — TSH: TSH: 3.244 u[IU]/mL (ref 0.350–4.500)

## 2015-06-14 MED ORDER — NORTRIPTYLINE HCL 10 MG PO CAPS: ORAL_CAPSULE | ORAL | Status: DC

## 2015-06-14 NOTE — Progress Notes (Signed)
Assessment and Plan:  Hypertension: Continue medication, monitor blood pressure at home. Continue DASH diet. Cholesterol: Continue diet and exercise. Check cholesterol.  Diabetes with CKD-Continue diet and exercise. Check A1C Vitamin D Def- check level and continue medications.  URI- likely viral- Will hold the zpak and take if she is not getting better, increase fluids, rest, cont allergy pill Left lower radicular pain- amitriptyline at night PRN, start low dose and go slow, if any dizziness stop and can try low dose gabapentin.   Continue diet and meds as discussed. Further disposition pending results of labs. Discussed med's effects and SE's.    HPI 79 y.o. female  presents for 3 month follow up with hypertension, hyperlipidemia, diabetes and vitamin D. Her blood pressure has been controlled at home, today their BP is BP: 126/80 mmHg She does not workout. She denies chest pain, shortness of breath, dizziness.  She is on cholesterol medication and denies myalgias. Her cholesterol is at goal. The cholesterol last visit was:   Lab Results  Component Value Date   CHOL 166 03/09/2015   HDL 36* 03/09/2015   LDLCALC 89 03/09/2015   TRIG 203* 03/09/2015   CHOLHDL 4.6 03/09/2015   She has been working on diet and exercise for Diabetes, but with diet and MF she is in the preDM range, she is on ACE, and denies hypoglycemia , polydipsia, polyuria and visual disturbances. Last A1C in the office was:  Lab Results  Component Value Date   HGBA1C 6.0* 03/09/2015   Lab Results  Component Value Date   GFRNONAA 87 03/09/2015   Patient is on Vitamin D supplement. Lab Results  Component Value Date   VD25OH 55 03/09/2015     Patient also complains  of possible URI x Saturday Symptoms include she has sinus congestion which causes trouble sleeping, nonprodcutive cough, sore throat, denies CP, SOB, fever, chills., and has been unchanged since that time. Treatment to date: none. Has had history of  lumbar surgery, states back is better but left leg has pain down it occ, no numbness/tingling, no weakness. Has seen Dr Lorin Mercy.   Current Medications:  Current Outpatient Prescriptions on File Prior to Visit  Medication Sig Dispense Refill  . aspirin 81 MG tablet Take 81 mg by mouth daily.    Marland Kitchen atenolol (TENORMIN) 100 MG tablet Take 1 tablet by mouth  daily 90 tablet 99  . atorvastatin (LIPITOR) 80 MG tablet Take 1 tablet by mouth  every day for cholesterol 90 tablet 1  . Calcium Citrate (CALCITRATE PO) Take 500 mg by mouth daily.    . Cholecalciferol (VITAMIN D PO) Take 2,000 Units by mouth 3 (three) times daily.    Marland Kitchen glucose blood test strip Check glucose daily 100 each 99  . HYDROcodone-acetaminophen (NORCO) 5-325 MG per tablet As directed 50 tablet 0  . lisinopril (PRINIVIL,ZESTRIL) 20 MG tablet Take 1 tablet by mouth in  the evening for blood  pressure 90 tablet 1  . meloxicam (MOBIC) 15 MG tablet Take 1 tablet by mouth  daily as needed for pain  and inflammation 90 tablet 1  . metFORMIN (GLUCOPHAGE-XR) 500 MG 24 hr tablet Take 1 tablet by mouth with breakfast and lunch and 2  tablets by mouth with  supper for diabetes 360 tablet 1  . MILK THISTLE PO Take by mouth 2 (two) times daily.    . Multiple Vitamin (MULTIVITAMIN) tablet Take 1 tablet by mouth daily.    Marland Kitchen omeprazole (PRILOSEC) 20 MG capsule Take  1 capsule by mouth  daily for GERD 90 capsule 1  . phentermine (ADIPEX-P) 37.5 MG tablet Take 1/2 to 1 tablet each morning for dieting & weight loss 30 tablet 5  . verapamil (CALAN) 120 MG tablet Take 1 tablet by mouth  twice a day 180 tablet 1  . vitamin B-12 (CYANOCOBALAMIN) 1000 MCG tablet Take 1,000 mcg by mouth daily.     No current facility-administered medications on file prior to visit.   Medical History:  Past Medical History  Diagnosis Date  . Hypertension   . Hyperlipidemia   . Vitamin D deficiency   . DDD (degenerative disc disease), lumbar   . Type II or unspecified type  diabetes mellitus without mention of complication, not stated as uncontrolled   . Fatty liver disease, nonalcoholic     CT AB 0000000   Allergies:  Allergies  Allergen Reactions  . Diltiazem     Review of Systems  Constitutional: Positive for malaise/fatigue. Negative for fever, chills and diaphoresis.  HENT: Positive for congestion and sore throat. Negative for ear discharge, ear pain, hearing loss and tinnitus.   Eyes: Negative.   Respiratory: Positive for cough. Negative for sputum production, shortness of breath and wheezing.   Cardiovascular: Negative.  Negative for chest pain and leg swelling.  Gastrointestinal: Negative.   Genitourinary: Negative.   Musculoskeletal: Positive for back pain. Negative for myalgias, joint pain, falls and neck pain.  Neurological: Positive for sensory change (pain left leg). Negative for dizziness and headaches.  Psychiatric/Behavioral: Negative.      Family history- Review and unchanged Social history- Review and unchanged Physical Exam: BP 126/80 mmHg  Pulse 68  Temp(Src) 97.5 F (36.4 C) (Temporal)  Resp 14  Ht 5\' 3"  (1.6 m)  Wt 159 lb (72.122 kg)  BMI 28.17 kg/m2  SpO2 97% Wt Readings from Last 3 Encounters:  06/14/15 159 lb (72.122 kg)  03/09/15 163 lb 12.8 oz (74.299 kg)  12/02/14 167 lb (75.751 kg)   General Appearance: Well nourished, in no apparent distress. Eyes: PERRLA, EOMs, conjunctiva no swelling or erythema Sinuses: No Frontal/maxillary tenderness ENT/Mouth: Ext aud canals clear, TMs without erythema, bulging. No erythema, swelling, or exudate on post pharynx.  Tonsils not swollen or erythematous. Hearing normal.  Neck: Supple, thyroid normal.  Respiratory: Respiratory effort normal, BS equal bilaterally without rales, rhonchi, wheezing or stridor.  Cardio: RRR with no MRGs. Brisk peripheral pulses without edema.  Abdomen: Soft, + BS.  Non tender, no guarding, rebound, hernias, masses. Lymphatics: Non tender without  lymphadenopathy.  Musculoskeletal: Full ROM, 5/5 strength, normal gait. + left SI pain, normal sensation, strength distal.  Skin: Warm, dry without rashes, lesions, ecchymosis.  Neuro: Cranial nerves intact. No cerebellar symptoms. Sensation intact.  Psych: Awake and oriented X 3, normal affect, Insight and Judgment appropriate.    Vicie Mutters 8:54 AM

## 2015-06-14 NOTE — Patient Instructions (Addendum)
Sinusitis can be uncomfortable. People with sinusitis have congestion with yellow/green/gray discharge, sinus pain/pressure, pain around the eyes. Sinus infections almost ALWAYS stem from a viral infection and antibiotics don't work against a virus. Even when bacteria is responsible, the infections usually clear up on their own in a week or so.   Risk of antibiotic use: About 1 in 4 people who take antibiotics have side effects including stomach problems, dizziness, or rashes. Those problems clear up soon after stopping the drugs, but in rare cases antibiotics can cause severe allergic reaction. Over use of antibiotics also encourages the growth of bacteria that can't be controlled easily with drugs. That makes you more vunerable to antibiotic-resistant infections and undermines the benefits of antibiotics for others.   Waste of Money: Antibiotics often aren't very expensive, but any money spent on unnecessary drugs is money down the drain.   When are antibiotics needed? Only when symptoms last longer than a week.  Start to improve but then worsen again  -It can take up to 2 weeks to feel better.   -If you do not get better in 7-10 days (Have fever, facial pain, dental pain and swelling), then please call the office and it is now appropriate to start an antibiotic.   -Please take Tylenol or Ibuprofen for pain. -Acetaminiphen 325mg  orally every 4-6 hours for pain.  Max: 10 per day -Ibuprofen 200mg  orally every 6-8 hours for pain.  Take with food to avoid ulcers.   Max 10 per day  Please pick one of the over the counter allergy medications below and take it once daily for allergies.  Claritin or loratadine cheapest but likely the weakest  Zyrtec or certizine at night because it can make you sleepy The strongest is allegra or fexafinadine  Cheapest at [employer], [employer], [employer]  -While drinking fluids, pinch and hold nose close and swallow.  This will help open up your eustachian tubes to drain  the fluid behind your ear drums. -Try steam showers to open your nasal passages.   Drink lots of water to stay hydrated and to thin mucous.  Flonase/Nasonex is to help the inflammation.  Take 2 sprays in each nostril at bedtime.  Make sure you spray towards the outside of each nostril towards the outer corner of your eye, hold nose close and tilt head back.  This will help the medication get into your sinuses.  If you do not like this medication, then use saline nasal sprays same directions as above for Flonase. Stop the medication right away if you get blurring of your vision or nose bleeds.  Sinusitis Sinusitis is redness, soreness, and inflammation of the paranasal sinuses. Paranasal sinuses are air pockets within the bones of your face (beneath the eyes, the middle of the forehead, or above the eyes). In healthy paranasal sinuses, mucus is able to drain out, and air is able to circulate through them by way of your nose. However, when your paranasal sinuses are inflamed, mucus and air can become trapped. This can allow bacteria and other germs to grow and cause infection. Sinusitis can develop quickly and last only a short time (acute) or continue over a long period (chronic). Sinusitis that lasts for more than 12 weeks is considered chronic.  CAUSES  Causes of sinusitis include: Allergies. Structural abnormalities, such as displacement of the cartilage that separates your nostrils (deviated septum), which can decrease the air flow through your nose and sinuses and affect sinus drainage. Functional abnormalities, such as when the  small hairs (cilia) that line your sinuses and help remove mucus do not work properly or are not present. SIGNS AND SYMPTOMS  Symptoms of acute and chronic sinusitis are the same. The primary symptoms are pain and pressure around the affected sinuses. Other symptoms include: Upper toothache. Earache. Headache. Bad breath. Decreased sense of smell and taste. A cough,  which worsens when you are lying flat. Fatigue. Fever. Thick drainage from your nose, which often is green and may contain pus (purulent). Swelling and warmth over the affected sinuses. DIAGNOSIS  Your health care provider will perform a physical exam. During the exam, your health care provider may: Look in your nose for signs of abnormal growths in your nostrils (nasal polyps).  Tap over the affected sinus to check for signs of infection. View the inside of your sinuses (endoscopy) using an imaging device that has a light attached (endoscope). If your health care provider suspects that you have chronic sinusitis, one or more of the following tests may be recommended: Allergy tests. Nasal culture. A sample of mucus is taken from your nose, sent to a lab, and screened for bacteria. Nasal cytology. A sample of mucus is taken from your nose and examined by your health care provider to determine if your sinusitis is related to an allergy. TREATMENT  Most cases of acute sinusitis are related to a viral infection and will resolve on their own within 10 days. Sometimes medicines are prescribed to help relieve symptoms (pain medicine, decongestants, nasal steroid sprays, or saline sprays).  However, for sinusitis related to a bacterial infection, your health care provider will prescribe antibiotic medicines. These are medicines that will help kill the bacteria causing the infection.  Rarely, sinusitis is caused by a fungal infection. In theses cases, your health care provider will prescribe antifungal medicine. For some cases of chronic sinusitis, surgery is needed. Generally, these are cases in which sinusitis recurs more than 3 times per year, despite other treatments. HOME CARE INSTRUCTIONS  Drink plenty of water. Water helps thin the mucus so your sinuses can drain more easily. Use a humidifier. Inhale steam 3 to 4 times a day (for example, sit in the bathroom with the shower running). Apply a  warm, moist washcloth to your face 3 to 4 times a day, or as directed by your health care provider. Use saline nasal sprays to help moisten and clean your sinuses. Take medicines only as directed by your health care provider. If you were prescribed either an antibiotic or antifungal medicine, finish it all even if you start to feel better. SEEK IMMEDIATE MEDICAL CARE IF: You have increasing pain or severe headaches. You have nausea, vomiting, or drowsiness. You have swelling around your face. You have vision problems. You have a stiff neck. You have difficulty breathing. MAKE SURE YOU:  Understand these instructions. Will watch your condition. Will get help right away if you are not doing well or get worse. Document Released: 07/01/2005 Document Revised: 11/15/2013 Document Reviewed: 07/16/2011 [facility] Patient Information 2015 [place], [place]. This information is not intended to replace advice given to you by your health care provider. Make sure you discuss any questions you have with your health care provider.     Bad carbs also include fruit juice, alcohol, and sweet tea. These are empty calories that do not signal to your brain that you are full.   Please remember the good carbs are still carbs which convert into sugar. So please measure them out no more than 1/2-1  cup of rice, oatmeal, pasta, and beans  Veggies are however free foods! Pile them on.   Not all fruit is created equal. Please see the list below, the fruit at the bottom is higher in sugars than the fruit at the top. Please avoid all dried fruits.

## 2015-06-16 ENCOUNTER — Other Ambulatory Visit: Payer: Self-pay | Admitting: Physician Assistant

## 2015-06-16 MED ORDER — PROMETHAZINE-DM 6.25-15 MG/5ML PO SYRP: 5.0000 mL | ORAL_SOLUTION | Freq: Four times a day (QID) | ORAL | Status: DC | PRN

## 2015-07-20 DIAGNOSIS — E119 Type 2 diabetes mellitus without complications: Secondary | ICD-10-CM | POA: Diagnosis not present

## 2015-07-20 DIAGNOSIS — H2513 Age-related nuclear cataract, bilateral: Secondary | ICD-10-CM | POA: Diagnosis not present

## 2015-08-19 ENCOUNTER — Other Ambulatory Visit: Payer: Self-pay | Admitting: Internal Medicine

## 2015-08-28 ENCOUNTER — Encounter: Payer: Self-pay | Admitting: Internal Medicine

## 2015-09-02 ENCOUNTER — Encounter: Payer: Self-pay | Admitting: *Deleted

## 2015-09-15 ENCOUNTER — Other Ambulatory Visit: Payer: Self-pay | Admitting: Internal Medicine

## 2015-09-17 ENCOUNTER — Encounter: Payer: Self-pay | Admitting: Internal Medicine

## 2015-09-17 NOTE — Patient Instructions (Signed)

## 2015-09-17 NOTE — Progress Notes (Signed)
Patient ID: Debra Collins, female   DOB: Jan 19, 1936, 80 y.o.   MRN: [id number]  Comprehensive Evaluation &  Examination  This very nice 80 y.o. MWF presents for a comprehensive evaluation and management of multiple medical co-morbidities.  Patient has been followed for HTN, T2_NIDDM, Hyperlipidemia, Hypothyroidism and Vitamin D Deficiency. Patient is s/p Cx and Lumbar DDD surg and is c/o ongoing L sciatica pains and occas neck pains. She has seen Dr Nelva Bush in the past for EDSI's.     HTN predates since 100. Patient's BP has been controlled at home and patient denies any cardiac symptoms as chest pain, palpitations, shortness of breath, dizziness or ankle swelling. Today's BP ias 158/76 - rechecked at 142/72.     Patient's hyperlipidemia is controlled with diet and medications. Patient denies myalgias or other medication SE's. Last lipids were at goal with Cholesterol 138; HDL 36*; LDL 75; Triglycerides 133 on 06/14/2015.   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 Last A1c was  5.8% on 06/14/2015. Patient reports CBG's range in the 80-90's.    Finally, patient has history of Vitamin D Deficiency of "81" 2009 on treatment and last Vitamin D was 55 on 03/09/2015.   Medication Sig  . aspirin 81 MG  Take  daily.  Marland Kitchen atenolol  100 MG  Take 1 tab  daily  . atorvastatin 80 MG  Take 1 tab every day for cholesterol  . Calcium 500 mg Take  by mouth daily.  Marland Kitchen VITAMIN D  Take 2,000 Units  3  times daily.  Lebron Quam 5-325  As directed  . Lisinopril 20 MG  Take 1 tab in  the evening   . meloxicam 15 MG Take 1 tab daily as needed for pain  and inflammation  . metFORMIN-XR 500 MG TAKE 1 TAB w/BKFST & LUNCH & 2  TAB w/SUPPER   . MILK THISTLE  Take by mouth 2 (two) times daily.  . Multiple Vitamin Take 1 tablet by mouth daily.  . nortriptyline  10 MG  1-2 at bed time for nerve pain.  Marland Kitchen omeprazole  20 MG   Take 1 capsule by mouth  daily for GERD  . Phentermine 37.5 MG  Take 1/2 to 1 tablet each morning for dieting & weight loss  . verapamil 120 MG  Take 1 tablet by mouth  twice a day  . vitamin B-12   Take 1,000 mcg by mouth daily.   Allergies  Allergen Reactions  . Diltiazem    Past Medical History  Diagnosis Date  . Hypertension   . Hyperlipidemia   . Vitamin D deficiency   . DDD (degenerative disc disease), lumbar   . Type II or unspecified type diabetes mellitus without mention of complication, not stated as uncontrolled   . Fatty liver disease, nonalcoholic     CT AB 0000000   Health Maintenance  Topic Date Due  . DEXA SCAN  03/12/2003  . FOOT EXAM  12/02/2015  . HEMOGLOBIN A1C  12/12/2015  . INFLUENZA VACCINE  02/13/2016  . OPHTHALMOLOGY EXAM  07/19/2016  . TETANUS/TDAP  07/09/2021  . ZOSTAVAX  Completed  . PNA vac Low Risk Adult  Completed   Immunization History  Administered Date(s) Administered  . H1N1 06/28/2008  . Influenza Whole 05/04/2013  . Influenza, High Dose Seasonal PF 04/26/2014, 04/26/2015  . Pneumococcal Conjugate-13 04/26/2014  . Pneumococcal Polysaccharide-23 07/10/2011  . Td 07/10/2011  .  Zoster 06/28/2008   Past Surgical History  Procedure Laterality Date  . Knee arthroscopy Left   . Appendectomy    . Brain surgery    . Mastectomy Bilateral     30 years ago   Family History  Problem Relation Age of Onset  . Stroke Mother   . Heart disease Mother   . Heart disease Father   . Heart attack Father    Social History  Substance Use Topics  . Smoking status: Former Smoker    Quit date: 07/15/1993  . Smokeless tobacco: None  . Alcohol Use: 2.5 oz/week    5 Standard drinks or equivalent per week    ROS Constitutional: Denies fever, chills, weight loss/gain, headaches, insomnia,  night sweats, and change in appetite. Does c/o fatigue. Eyes: Denies redness, blurred vision, diplopia, discharge, itchy, watery eyes.  ENT: Denies discharge,  congestion, post nasal drip, epistaxis, sore throat, earache, hearing loss, dental pain, Tinnitus, Vertigo, Sinus pain, snoring.  Cardio: Denies chest pain, palpitations, irregular heartbeat, syncope, dyspnea, diaphoresis, orthopnea, PND, claudication, edema Respiratory: denies cough, dyspnea, DOE, pleurisy, hoarseness, laryngitis, wheezing.  Gastrointestinal: Denies dysphagia, heartburn, reflux, water brash, pain, cramps, nausea, vomiting, bloating, diarrhea, constipation, hematemesis, melena, hematochezia, jaundice, hemorrhoids Genitourinary: Denies dysuria, frequency, urgency, nocturia, hesitancy, discharge, hematuria, flank pain Breast: Breast lumps, nipple discharge, bleeding.  Musculoskeletal: Denies arthralgia, myalgia, stiffness, Jt. Swelling, pain, limp, and strain/sprain. Denies falls. Skin: Denies puritis, rash, hives, warts, acne, eczema, changing in skin lesion Neuro: No weakness, tremor, incoordination, spasms, paresthesia, pain Psychiatric: Denies confusion, memory loss, sensory loss. Denies Depression. Endocrine: Denies change in weight, skin, hair change, nocturia, and paresthesia, diabetic polys, visual blurring, hyper / hypo glycemic episodes.  Heme/Lymph: No excessive bleeding, bruising, enlarged lymph nodes.  Physical Exam  BP 158/76 re-cked at 142/72   Pulse 72  Temp 97.6 F Resp 16  Ht 5' 2.5"   Wt 163 lb 6.4 oz     BMI 29.39   General Appearance: Over nourished with central obesity and in no apparent distress. Eyes: PERRLA, EOMs, conjunctiva no swelling or erythema, normal fundi and vessels. Sinuses: No frontal/maxillary tenderness ENT/Mouth: EACs patent / TMs  nl. Nares clear without erythema, swelling, mucoid exudates. Oral hygiene is good. No erythema, swelling, or exudate. Tongue normal, non-obstructing. Tonsils not swollen or erythematous. Hearing normal.  Neck: Supple, thyroid normal. No bruits, nodes or JVD. Respiratory: Respiratory effort normal.  BS equal  and clear bilateral without rales, rhonci, wheezing or stridor. Cardio: Heart sounds are normal with regular rate and rhythm and no murmurs, rubs or gallops. Peripheral pulses are normal and equal bilaterally without edema. No aortic or femoral bruits. Chest: symmetric with normal excursions and percussion. Breasts: Symmetric, without lumps, nipple discharge, retractions, or fibrocystic changes.  Abdomen: Flat, soft, with bowl sounds. Nontender, no guarding, rebound, hernias, masses, or organomegaly.  Lymphatics: Non tender without lymphadenopathy.  Genitourinary:  Musculoskeletal: Full ROM all peripheral extremities, joint stability, 5/5 strength, and normal gait. Skin: Warm and dry without rashes, lesions, cyanosis, clubbing or  ecchymosis.  Neuro: Cranial nerves intact, reflexes equal bilaterally. Normal muscle tone, no cerebellar symptoms. Sensation intact to touch, Vibratory & monofilament testing to the toes bilaterally.  Pysch: Alert and oriented X 3, normal affect, Insight and Judgment appropriate.   Assessment and Plan  1. Essential hypertension  - EKG 12-Lead - TSH  2. Mixed hyperlipidemia  - Lipid panel - TSH  3. T2_NIDDM w/CKD  - Microalbumin / creatinine urine ratio - Hemoglobin A1c -  Insulin, random - HM DIABETES FOOT EXAM - LOW EXTREMITY NEUR EXAM DOCUM  4. Vitamin D deficiency  - VITAMIN D 25 Hydroxy  5. Screening for rectal cancer   6. Medication management  - Urinalysis, Routine w reflex microscopic  - CBC with Differential/Platelet - BASIC METABOLIC PANEL WITH GFR - Hepatic function panel - Magnesium   Continue prudent diet as discussed, weight control, BP monitoring, regular exercise, and medications. Discussed med's effects and SE's. Screening labs and tests as requested with regular follow-up as recommended. Over 40 minutes of exam, counseling, chart review and high complex critical decision making was performed.

## 2015-09-18 ENCOUNTER — Ambulatory Visit (INDEPENDENT_AMBULATORY_CARE_PROVIDER_SITE_OTHER): Payer: Medicare Other | Admitting: Internal Medicine

## 2015-09-18 ENCOUNTER — Other Ambulatory Visit: Payer: Self-pay | Admitting: Internal Medicine

## 2015-09-18 ENCOUNTER — Encounter: Payer: Self-pay | Admitting: Internal Medicine

## 2015-09-18 VITALS — BP 158/76 | HR 72 | Temp 97.6°F | Resp 16 | Ht 62.5 in | Wt 163.4 lb

## 2015-09-18 DIAGNOSIS — Z79899 Other long term (current) drug therapy: Secondary | ICD-10-CM

## 2015-09-18 DIAGNOSIS — E1121 Type 2 diabetes mellitus with diabetic nephropathy: Secondary | ICD-10-CM | POA: Diagnosis not present

## 2015-09-18 DIAGNOSIS — N390 Urinary tract infection, site not specified: Secondary | ICD-10-CM | POA: Diagnosis not present

## 2015-09-18 DIAGNOSIS — E559 Vitamin D deficiency, unspecified: Secondary | ICD-10-CM

## 2015-09-18 DIAGNOSIS — E782 Mixed hyperlipidemia: Secondary | ICD-10-CM

## 2015-09-18 DIAGNOSIS — E1129 Type 2 diabetes mellitus with other diabetic kidney complication: Secondary | ICD-10-CM | POA: Diagnosis not present

## 2015-09-18 DIAGNOSIS — I10 Essential (primary) hypertension: Secondary | ICD-10-CM | POA: Diagnosis not present

## 2015-09-18 DIAGNOSIS — Z1212 Encounter for screening for malignant neoplasm of rectum: Secondary | ICD-10-CM

## 2015-09-18 LAB — CBC WITH DIFFERENTIAL/PLATELET
BASOS ABS: 0 10*3/uL (ref 0.0–0.1)
Basophils Relative: 0 % (ref 0–1)
EOS PCT: 2 % (ref 0–5)
Eosinophils Absolute: 0.1 10*3/uL (ref 0.0–0.7)
HEMATOCRIT: 42 % (ref 36.0–46.0)
HEMOGLOBIN: 13.6 g/dL (ref 12.0–15.0)
LYMPHS ABS: 1.7 10*3/uL (ref 0.7–4.0)
Lymphocytes Relative: 30 % (ref 12–46)
MCH: 30.3 pg (ref 26.0–34.0)
MCHC: 32.4 g/dL (ref 30.0–36.0)
MCV: 93.5 fL (ref 78.0–100.0)
MPV: 10.4 fL (ref 8.6–12.4)
Monocytes Absolute: 0.7 10*3/uL (ref 0.1–1.0)
Monocytes Relative: 12 % (ref 3–12)
NEUTROS ABS: 3.2 10*3/uL (ref 1.7–7.7)
NEUTROS PCT: 56 % (ref 43–77)
Platelets: 203 10*3/uL (ref 150–400)
RBC: 4.49 MIL/uL (ref 3.87–5.11)
RDW: 14.4 % (ref 11.5–15.5)
WBC: 5.8 10*3/uL (ref 4.0–10.5)

## 2015-09-18 LAB — HEPATIC FUNCTION PANEL
ALT: 39 U/L — ABNORMAL HIGH (ref 6–29)
AST: 32 U/L (ref 10–35)
Albumin: 4.3 g/dL (ref 3.6–5.1)
Alkaline Phosphatase: 45 U/L (ref 33–130)
Bilirubin, Direct: 0.1 mg/dL (ref <=0.2)
Indirect Bilirubin: 0.5 mg/dL (ref 0.2–1.2)
Total Bilirubin: 0.6 mg/dL (ref 0.2–1.2)
Total Protein: 6.5 g/dL (ref 6.1–8.1)

## 2015-09-18 LAB — BASIC METABOLIC PANEL WITH GFR
BUN: 21 mg/dL (ref 7–25)
CHLORIDE: 99 mmol/L (ref 98–110)
CO2: 28 mmol/L (ref 20–31)
Calcium: 9.7 mg/dL (ref 8.6–10.4)
Creat: 0.64 mg/dL (ref 0.60–0.93)
GFR, Est African American: >89 mL/min (ref >=60)
GFR, Est Non African American: 86 mL/min (ref >=60)
GLUCOSE: 97 mg/dL (ref 65–99)
POTASSIUM: 4.4 mmol/L (ref 3.5–5.3)
Sodium: 140 mmol/L (ref 135–146)

## 2015-09-18 LAB — TSH: TSH: 2.53 mIU/L

## 2015-09-18 LAB — LIPID PANEL
CHOL/HDL RATIO: 5.3 ratio — AB (ref <=5.0)
CHOLESTEROL: 163 mg/dL (ref 125–200)
HDL: 31 mg/dL — AB (ref >=46)
LDL Cholesterol: 57 mg/dL (ref <130)
TRIGLYCERIDES: 374 mg/dL — AB (ref <150)
VLDL: 75 mg/dL — ABNORMAL HIGH (ref <30)

## 2015-09-18 LAB — MAGNESIUM: MAGNESIUM: 2 mg/dL (ref 1.5–2.5)

## 2015-09-18 LAB — HEMOGLOBIN A1C
HEMOGLOBIN A1C: 6 % — AB (ref <5.7)
Mean Plasma Glucose: 126 mg/dL — ABNORMAL HIGH (ref <117)

## 2015-09-19 LAB — URINALYSIS, MICROSCOPIC ONLY
Bacteria, UA: NONE SEEN [HPF]
CRYSTALS: NONE SEEN [HPF]
Casts: NONE SEEN [LPF]
RBC / HPF: NONE SEEN RBC/HPF (ref <=2)
SQUAMOUS EPITHELIAL / LPF: NONE SEEN [HPF] (ref <=5)
Yeast: NONE SEEN [HPF]

## 2015-09-19 LAB — URINALYSIS, ROUTINE W REFLEX MICROSCOPIC
Bilirubin Urine: NEGATIVE
Glucose, UA: NEGATIVE
Hgb urine dipstick: NEGATIVE
KETONES UR: NEGATIVE
NITRITE: NEGATIVE
PH: 6.5 (ref 5.0–8.0)
Protein, ur: NEGATIVE
SPECIFIC GRAVITY, URINE: 1.021 (ref 1.001–1.035)

## 2015-09-19 LAB — VITAMIN D 25 HYDROXY (VIT D DEFICIENCY, FRACTURES): VIT D 25 HYDROXY: 70 ng/mL (ref 30–100)

## 2015-09-19 LAB — MICROALBUMIN / CREATININE URINE RATIO
Creatinine, Urine: 171 mg/dL (ref 20–320)
MICROALB UR: 2.3 mg/dL
Microalb Creat Ratio: 13 mcg/mg creat (ref <30)

## 2015-09-19 LAB — INSULIN, RANDOM: Insulin: 18.2 u[IU]/mL (ref 2.0–19.6)

## 2015-09-21 ENCOUNTER — Other Ambulatory Visit: Payer: Self-pay | Admitting: Internal Medicine

## 2015-09-21 DIAGNOSIS — N390 Urinary tract infection, site not specified: Secondary | ICD-10-CM

## 2015-09-21 LAB — URINE CULTURE: Colony Count: >=100000

## 2015-09-21 MED ORDER — CIPROFLOXACIN HCL 500 MG PO TABS: ORAL_TABLET | ORAL | Status: DC

## 2015-09-22 NOTE — Addendum Note (Signed)
Addended by: Virtie Bungert A on: 09/22/2015 12:17 PM   Modules accepted: Orders

## 2015-09-23 ENCOUNTER — Other Ambulatory Visit: Payer: Self-pay | Admitting: Internal Medicine

## 2015-09-27 ENCOUNTER — Other Ambulatory Visit: Payer: Self-pay | Admitting: *Deleted

## 2015-09-27 DIAGNOSIS — Z1212 Encounter for screening for malignant neoplasm of rectum: Secondary | ICD-10-CM

## 2015-09-27 LAB — POC HEMOCCULT BLD/STL (HOME/3-CARD/SCREEN)
FECAL OCCULT BLD: NEGATIVE
FECAL OCCULT BLD: NEGATIVE
Fecal Occult Blood, POC: NEGATIVE

## 2015-10-19 ENCOUNTER — Ambulatory Visit (INDEPENDENT_AMBULATORY_CARE_PROVIDER_SITE_OTHER): Payer: Medicare Other | Admitting: *Deleted

## 2015-10-19 DIAGNOSIS — N390 Urinary tract infection, site not specified: Secondary | ICD-10-CM | POA: Diagnosis not present

## 2015-10-19 NOTE — Progress Notes (Signed)
Patient ID: Debra Collins, female   DOB: 04-Jan-1936, 80 y.o.   MRN: [id number] Patient presents for 1 month recheck UA, C&S.  Patient completed abx AD.  Denies any current UTI symptoms.

## 2015-10-20 LAB — URINALYSIS, ROUTINE W REFLEX MICROSCOPIC
Bilirubin Urine: NEGATIVE
Glucose, UA: NEGATIVE
HGB URINE DIPSTICK: NEGATIVE
Ketones, ur: NEGATIVE
LEUKOCYTES UA: NEGATIVE
NITRITE: NEGATIVE
PH: 6.5 (ref 5.0–8.0)
Protein, ur: NEGATIVE
Specific Gravity, Urine: 1.003 (ref 1.001–1.035)

## 2015-10-20 LAB — URINE CULTURE: Colony Count: 9000

## 2015-12-27 ENCOUNTER — Ambulatory Visit (INDEPENDENT_AMBULATORY_CARE_PROVIDER_SITE_OTHER): Payer: Medicare Other | Admitting: Internal Medicine

## 2015-12-27 ENCOUNTER — Encounter: Payer: Self-pay | Admitting: Internal Medicine

## 2015-12-27 VITALS — BP 158/82 | HR 68 | Temp 98.0°F | Resp 16 | Ht 62.5 in | Wt 163.0 lb

## 2015-12-27 DIAGNOSIS — E1121 Type 2 diabetes mellitus with diabetic nephropathy: Secondary | ICD-10-CM | POA: Diagnosis not present

## 2015-12-27 DIAGNOSIS — Z0001 Encounter for general adult medical examination with abnormal findings: Secondary | ICD-10-CM | POA: Diagnosis not present

## 2015-12-27 DIAGNOSIS — I10 Essential (primary) hypertension: Secondary | ICD-10-CM | POA: Diagnosis not present

## 2015-12-27 DIAGNOSIS — E1129 Type 2 diabetes mellitus with other diabetic kidney complication: Secondary | ICD-10-CM | POA: Diagnosis not present

## 2015-12-27 DIAGNOSIS — Z0000 Encounter for general adult medical examination without abnormal findings: Secondary | ICD-10-CM

## 2015-12-27 DIAGNOSIS — E559 Vitamin D deficiency, unspecified: Secondary | ICD-10-CM

## 2015-12-27 DIAGNOSIS — Z79899 Other long term (current) drug therapy: Secondary | ICD-10-CM | POA: Diagnosis not present

## 2015-12-27 DIAGNOSIS — R300 Dysuria: Secondary | ICD-10-CM | POA: Diagnosis not present

## 2015-12-27 DIAGNOSIS — E119 Type 2 diabetes mellitus without complications: Secondary | ICD-10-CM

## 2015-12-27 DIAGNOSIS — Z780 Asymptomatic menopausal state: Secondary | ICD-10-CM

## 2015-12-27 DIAGNOSIS — E782 Mixed hyperlipidemia: Secondary | ICD-10-CM | POA: Diagnosis not present

## 2015-12-27 DIAGNOSIS — R6889 Other general symptoms and signs: Secondary | ICD-10-CM | POA: Diagnosis not present

## 2015-12-27 LAB — CBC WITH DIFFERENTIAL/PLATELET
BASOS PCT: 0 %
Basophils Absolute: 0 cells/uL (ref 0–200)
Eosinophils Absolute: 159 cells/uL (ref 15–500)
Eosinophils Relative: 3 %
HEMATOCRIT: 39.3 % (ref 35.0–45.0)
Hemoglobin: 13 g/dL (ref 11.7–15.5)
LYMPHS PCT: 30 %
Lymphs Abs: 1590 cells/uL (ref 850–3900)
MCH: 31 pg (ref 27.0–33.0)
MCHC: 33.1 g/dL (ref 32.0–36.0)
MCV: 93.6 fL (ref 80.0–100.0)
MONO ABS: 583 {cells}/uL (ref 200–950)
MPV: 10.3 fL (ref 7.5–12.5)
Monocytes Relative: 11 %
NEUTROS ABS: 2968 {cells}/uL (ref 1500–7800)
Neutrophils Relative %: 56 %
PLATELETS: 178 10*3/uL (ref 140–400)
RBC: 4.2 MIL/uL (ref 3.80–5.10)
RDW: 14.5 % (ref 11.0–15.0)
WBC: 5.3 10*3/uL (ref 3.8–10.8)

## 2015-12-27 LAB — HEPATIC FUNCTION PANEL
ALBUMIN: 4.4 g/dL (ref 3.6–5.1)
ALK PHOS: 41 U/L (ref 33–130)
ALT: 40 U/L — AB (ref 6–29)
AST: 26 U/L (ref 10–35)
Bilirubin, Direct: 0.1 mg/dL (ref <=0.2)
Indirect Bilirubin: 0.5 mg/dL (ref 0.2–1.2)
TOTAL PROTEIN: 6.4 g/dL (ref 6.1–8.1)
Total Bilirubin: 0.6 mg/dL (ref 0.2–1.2)

## 2015-12-27 LAB — HEMOGLOBIN A1C
HEMOGLOBIN A1C: 5.9 % — AB (ref <5.7)
Mean Plasma Glucose: 123 mg/dL

## 2015-12-27 LAB — BASIC METABOLIC PANEL WITH GFR
BUN: 19 mg/dL (ref 7–25)
CALCIUM: 9.2 mg/dL (ref 8.6–10.4)
CO2: 26 mmol/L (ref 20–31)
Chloride: 103 mmol/L (ref 98–110)
Creat: 0.65 mg/dL (ref 0.60–0.93)
GFR, EST NON AFRICAN AMERICAN: 86 mL/min (ref >=60)
GLUCOSE: 99 mg/dL (ref 65–99)
POTASSIUM: 4.4 mmol/L (ref 3.5–5.3)
Sodium: 137 mmol/L (ref 135–146)

## 2015-12-27 LAB — LIPID PANEL
Cholesterol: 125 mg/dL (ref 125–200)
HDL: 35 mg/dL — AB (ref >=46)
LDL Cholesterol: 63 mg/dL (ref <130)
Total CHOL/HDL Ratio: 3.6 Ratio (ref <=5.0)
Triglycerides: 133 mg/dL (ref <150)
VLDL: 27 mg/dL (ref <30)

## 2015-12-27 MED ORDER — GLUCOSE BLOOD VI STRP: ORAL_STRIP | Status: DC

## 2015-12-27 MED ORDER — RANITIDINE HCL 300 MG PO TABS: 300.0000 mg | ORAL_TABLET | Freq: Every day | ORAL | Status: DC

## 2015-12-27 NOTE — Progress Notes (Signed)
MEDICARE ANNUAL WELLNESS VISIT AND CPE  Assessment:    1. Post-menopausal - DG Bone Density; Future  2. Essential hypertension -cont current meds -recheck of BP WNL -diet and exercise as tolerated -monitor at home - TSH  3. T2_NIDDM w/CKD -diet and exercise -monitor CBGs -test strips sent in  - Hemoglobin A1c  4. Mixed hyperlipidemia -diet and exercise -cont meds - Lipid panel  5. Vitamin D deficiency -cont Vit D  6. Medication management  - CBC with Differential/Platelet - BASIC METABOLIC PANEL WITH GFR - Hepatic function panel  7. Medicare annual wellness visit, subsequent -due next year  8. Dysuria -call in abx if infection has not cleared - Urinalysis, Routine w reflex microscopic (not at [facility]) - Urine culture  9. Type 2 diabetes mellitus without complication, without long-term current use of insulin (HCC) -diet and exercise - glucose blood test strip; Check glucose daily  Dispense: 100 each; Refill: 99     Over 40 minutes of exam, counseling, chart review and critical decision making was performed Future Appointments Date Time Provider [place]  04/01/2016 8:45 AM Unk Pinto, MD [facility] None  10/22/2016 10:00 AM Unk Pinto, MD [facility] None     Plan:   During the course of the visit the patient was educated and counseled about appropriate screening and preventive services including:    Pneumococcal vaccine   Prevnar 13  Influenza vaccine  Td vaccine  Screening electrocardiogram  Bone densitometry screening  Colorectal cancer screening  Diabetes screening  Glaucoma screening  Nutrition counseling   Advanced directives: requested  Conditions/risks identified: BMI: Discussed weight loss, diet, and increase physical activity.  Increase physical activity: AHA recommends 150 minutes of physical activity a week.  Medications reviewed Diabetes is at goal, ACE/ARB therapy: Yes. Urinary Incontinence is an  issue: discussed non pharmacology and pharmacology options.  Fall risk: low- discussed PT, home fall assessment, medications.    Subjective:  Debra Collins is a 80 y.o. female who presents for Medicare Annual Wellness Visit and complete physical.  Date of last medicare wellness visit was   She has had elevated blood pressure for  years. Her blood pressure has been controlled at home, today their BP is BP: (!) 158/82 mmHg She does workout. She denies chest pain, shortness of breath, dizziness. She has not taken her medication this morning.    She is on cholesterol medication and denies myalgias. Her cholesterol is at goal. The cholesterol last visit was:   Lab Results  Component Value Date   CHOL 163 09/18/2015   HDL 31* 09/18/2015   LDLCALC 57 09/18/2015   TRIG 374* 09/18/2015   CHOLHDL 5.3* 09/18/2015   Patient is watching her diet and exercise.  She does check her CBGs.  She checks her blood sugars daily.  No polydipsia, polyuria, polyphagia. Lab Results  Component Value Date   HGBA1C 6.0* 09/18/2015    Last GFR:   Lab Results  Component Value Date   GFRNONAA 86 09/18/2015   Lab Results  Component Value Date   GFRAA >89 09/18/2015   Patient is on Vitamin D supplement.   Lab Results  Component Value Date   VD25OH 19 09/18/2015     Patient reports that she is having a numbness on the left side of her neck and her occiput and it has been going on for several months.  She notes that it is not painful.  She notes that this only happens during the day.  It does not  happen at nighttime.  She does have a history of a cervical fusion.    She notes that that she has stopped omeprazole.  She notes that she is having a lot of water brash and acid reflux at nighttime.  She stopped the medication abruptly.    She does want her urine to be rechecked as she did have recent dark yellow urine.  No frequency, odor, or urgency.  No hematuria.  Mild discomfort urinating.    Medication  Review: Current Outpatient Prescriptions on File Prior to Visit  Medication Sig Dispense Refill  . aspirin 81 MG tablet Take 81 mg by mouth daily.    Marland Kitchen atenolol (TENORMIN) 100 MG tablet Take 1 tablet by mouth  daily 90 tablet 1  . atorvastatin (LIPITOR) 80 MG tablet Take 1 tablet by mouth  every day for cholesterol 90 tablet 1  . Calcium Citrate (CALCITRATE PO) Take 500 mg by mouth daily.    . Cholecalciferol (VITAMIN D PO) Take 5,000 Units by mouth 2 (two) times daily.     Marland Kitchen glucose blood test strip Check glucose daily 100 each 99  . lisinopril (PRINIVIL,ZESTRIL) 20 MG tablet Take 1 tablet by mouth in  the evening for blood  pressure 90 tablet 1  . meloxicam (MOBIC) 15 MG tablet Take 1 tablet by mouth  daily as needed for pain  and inflammation 90 tablet 1  . metFORMIN (GLUCOPHAGE-XR) 500 MG 24 hr tablet TAKE 1 TABLET BY MOUTH WITH BREAKFAST AND LUNCH AND 2  TABLETS BY MOUTH WITH  SUPPER FOR DIABETES 360 tablet 1  . MILK THISTLE PO Take by mouth 2 (two) times daily.    . Multiple Vitamin (MULTIVITAMIN) tablet Take 1 tablet by mouth daily.    . nortriptyline (PAMELOR) 10 MG capsule 1-2 at bed time for nerve pain. 60 capsule 0  . phentermine (ADIPEX-P) 37.5 MG tablet Take 1/2 to 1 tablet each morning for dieting & weight loss 30 tablet 5  . verapamil (CALAN) 120 MG tablet Take 1 tablet by mouth  twice a day 180 tablet 1  . vitamin B-12 (CYANOCOBALAMIN) 1000 MCG tablet Take 1,000 mcg by mouth daily.     No current facility-administered medications on file prior to visit.    Current Problems (verified) Patient Active Problem List   Diagnosis Date Noted  . Medicare annual wellness visit, subsequent 03/09/2015  . T2_NIDDM w/CKD 08/16/2014  . Medication management 01/23/2014  . Essential hypertension 07/25/2013  . Mixed hyperlipidemia 07/25/2013  . Vitamin D deficiency 07/25/2013    Screening Tests Immunization History  Administered Date(s) Administered  . H1N1 06/28/2008  . Influenza  Whole 05/04/2013  . Influenza, High Dose Seasonal PF 04/26/2014, 04/26/2015  . Pneumococcal Conjugate-13 04/26/2014  . Pneumococcal Polysaccharide-23 07/10/2011  . Td 07/10/2011  . Zoster 06/28/2008    Preventative care: Last colonoscopy: 2005 Last mammogram: bilateral mastectomy DEXA:  Prior vaccinations: TD or Tdap: 2012  Influenza: 2016  Pneumococcal: 2012 Prevnar13: 2015 Shingles/Zostavax: 2009  Names of Other Physician/Practitioners you currently use: 1. [facility] here for primary care 2. Dr. Gershon Crane, eye doctor, last visit 2017 3., Dr. Lavone Neri dentist, last visit 2017 Patient Care Team: Unk Pinto, MD as PCP - General (Internal Medicine) Melissa Noon, OD (Optometry)  SURGICAL HISTORY Past Surgical History  Procedure Laterality Date  . Knee arthroscopy Left   . Appendectomy    . Brain surgery    . Mastectomy Bilateral     30 years ago  FAMILY HISTORY Family History  Problem Relation Age of Onset  . Stroke Mother   . Heart disease Mother   . Heart disease Father   . Heart attack Father    SOCIAL HISTORY Social History  Substance Use Topics  . Smoking status: Former Smoker    Quit date: 07/15/1993  . Smokeless tobacco: None  . Alcohol Use: 2.5 oz/week    5 Standard drinks or equivalent per week    Allergies  Allergen Reactions  . Diltiazem     MEDICARE WELLNESS OBJECTIVES: Tobacco use: She does not smoke.  Patient is a former smoker. If yes, counseling given Alcohol Current alcohol use: glass of wine with dinner Diet: well balanced Physical activity: Current Exercise Habits: The patient does not participate in regular exercise at present, Intensity: Mild Cardiac risk factors: Cardiac Risk Factors include: advanced age (>48men, >49 women);diabetes mellitus;dyslipidemia;hypertension Depression/mood screen:   Depression screen [facility] 2/9 12/27/2015  Decreased Interest 0  Down, Depressed, Hopeless 0  PHQ - 2  Score 0    ADLs:  In your present state of health, do you have any difficulty performing the following activities: 12/27/2015 09/18/2015  Hearing? N N  Vision? N N  Difficulty concentrating or making decisions? N N  Walking or climbing stairs? N N  Dressing or bathing? N N  Doing errands, shopping? N N  Preparing Food and eating ? N -  Using the Toilet? N -  In the past six months, have you accidently leaked urine? N -  Do you have problems with loss of bowel control? N -  Managing your Medications? N -  Managing your Finances? N -  Housekeeping or managing your Housekeeping? N -     Cognitive Testing  Alert? Yes  Normal Appearance?Yes  Oriented to person? Yes  Place? Yes   Time? Yes  Recall of three objects?  Yes  Can perform simple calculations? Yes  Displays appropriate judgment?Yes  Can read the correct time from a watch face?Yes  EOL planning: Does patient have an advance directive?: Yes Type of Advance Directive: Healthcare Power of Attorney, Living will Does patient want to make changes to advanced directive?: No - Patient declined Copy of advanced directive(s) in chart?: No - copy requested  Review of Systems  Constitutional: Negative for fever, chills and malaise/fatigue.  HENT: Negative for congestion, ear pain and sore throat.   Eyes: Negative.   Respiratory: Negative for cough, shortness of breath and wheezing.   Cardiovascular: Negative for chest pain, palpitations and leg swelling.  Gastrointestinal: Negative for heartburn, abdominal pain, diarrhea, constipation, blood in stool and melena.  Genitourinary: Negative.   Skin: Negative.   Neurological: Negative for dizziness, sensory change, loss of consciousness and headaches.  Psychiatric/Behavioral: Negative for depression. The patient is not nervous/anxious and does not have insomnia.      Objective:     Today's Vitals   12/27/15 0853  BP: 158/82  Pulse: 68  Temp: 98 F (36.7 C)  TempSrc: Temporal   Resp: 16  Height: 5' 2.5" (1.588 m)  Weight: 163 lb (73.936 kg)   Body mass index is 29.32 kg/(m^2).  General appearance: alert, no distress, WD/WN, female HEENT: normocephalic, sclerae anicteric, TMs pearly, nares patent, no discharge or erythema, pharynx normal Oral cavity: MMM, no lesions Neck: supple, no lymphadenopathy, no thyromegaly, no masses Heart: RRR, normal S1, S2, no murmurs Lungs: CTA bilaterally, no wheezes, rhonchi, or rales Abdomen: +bs, soft, non tender, non distended, no masses, no hepatomegaly, no  splenomegaly Musculoskeletal: nontender, no swelling, no obvious deformity Extremities: no edema, no cyanosis, no clubbing Pulses: 2+ symmetric, upper and lower extremities, normal cap refill Neurological: alert, oriented x 3, CN2-12 intact, strength normal upper extremities and lower extremities, sensation normal throughout, DTRs 2+ throughout, no cerebellar signs, gait normal Psychiatric: normal affect, behavior normal, pleasant   Medicare Attestation I have personally reviewed: The patient's medical and social history Their use of alcohol, tobacco or illicit drugs Their current medications and supplements The patient's functional ability including ADLs,fall risks, home safety risks, cognitive, and hearing and visual impairment Diet and physical activities Evidence for depression or mood disorders  The patient's weight, height, BMI, and visual acuity have been recorded in the chart.  I have made referrals, counseling, and provided education to the patient based on review of the above and I have provided the patient with a written personalized care plan for preventive services.     Starlyn Skeans, PA-C   12/27/2015

## 2015-12-28 LAB — TSH: TSH: 3.09 m[IU]/L

## 2015-12-28 LAB — URINALYSIS, ROUTINE W REFLEX MICROSCOPIC
BILIRUBIN URINE: NEGATIVE
Glucose, UA: NEGATIVE
HGB URINE DIPSTICK: NEGATIVE
KETONES UR: NEGATIVE
Nitrite: NEGATIVE
PROTEIN: NEGATIVE
SPECIFIC GRAVITY, URINE: 1.015 (ref 1.001–1.035)
pH: 5.5 (ref 5.0–8.0)

## 2015-12-28 LAB — URINALYSIS, MICROSCOPIC ONLY
Bacteria, UA: NONE SEEN [HPF]
CASTS: NONE SEEN [LPF]
CRYSTALS: NONE SEEN [HPF]
RBC / HPF: NONE SEEN RBC/HPF (ref <=2)
Squamous Epithelial / LPF: NONE SEEN [HPF] (ref <=5)
WBC, UA: NONE SEEN WBC/HPF (ref <=5)
Yeast: NONE SEEN [HPF]

## 2015-12-29 LAB — URINE CULTURE

## 2016-01-03 ENCOUNTER — Other Ambulatory Visit: Payer: Self-pay | Admitting: Physician Assistant

## 2016-01-03 DIAGNOSIS — E2839 Other primary ovarian failure: Secondary | ICD-10-CM

## 2016-01-25 ENCOUNTER — Other Ambulatory Visit: Payer: Self-pay | Admitting: Internal Medicine

## 2016-01-26 ENCOUNTER — Ambulatory Visit
Admission: RE | Admit: 2016-01-26 | Discharge: 2016-01-26 | Disposition: A | Discharge status: Home / Self Care | Payer: Medicare Other | Source: Ambulatory Visit | Attending: Physician Assistant | Admitting: Physician Assistant

## 2016-01-26 DIAGNOSIS — Z780 Asymptomatic menopausal state: Secondary | ICD-10-CM | POA: Diagnosis not present

## 2016-01-26 DIAGNOSIS — E2839 Other primary ovarian failure: Secondary | ICD-10-CM

## 2016-01-26 DIAGNOSIS — Z13820 Encounter for screening for osteoporosis: Secondary | ICD-10-CM | POA: Diagnosis not present

## 2016-03-13 DIAGNOSIS — H10501 Unspecified blepharoconjunctivitis, right eye: Secondary | ICD-10-CM | POA: Diagnosis not present

## 2016-04-01 ENCOUNTER — Ambulatory Visit (INDEPENDENT_AMBULATORY_CARE_PROVIDER_SITE_OTHER): Payer: Medicare Other | Admitting: Internal Medicine

## 2016-04-01 ENCOUNTER — Encounter: Payer: Self-pay | Admitting: Internal Medicine

## 2016-04-01 VITALS — BP 138/66 | HR 72 | Temp 97.1°F | Resp 16 | Ht 62.5 in | Wt 160.0 lb

## 2016-04-01 DIAGNOSIS — Z23 Encounter for immunization: Secondary | ICD-10-CM | POA: Diagnosis not present

## 2016-04-01 DIAGNOSIS — Z79899 Other long term (current) drug therapy: Secondary | ICD-10-CM

## 2016-04-01 DIAGNOSIS — E782 Mixed hyperlipidemia: Secondary | ICD-10-CM | POA: Diagnosis not present

## 2016-04-01 DIAGNOSIS — I10 Essential (primary) hypertension: Secondary | ICD-10-CM | POA: Diagnosis not present

## 2016-04-01 DIAGNOSIS — E559 Vitamin D deficiency, unspecified: Secondary | ICD-10-CM | POA: Diagnosis not present

## 2016-04-01 DIAGNOSIS — E1121 Type 2 diabetes mellitus with diabetic nephropathy: Secondary | ICD-10-CM

## 2016-04-01 LAB — TSH: TSH: 3.22 m[IU]/L

## 2016-04-01 LAB — HEPATIC FUNCTION PANEL
ALT: 21 U/L (ref 6–29)
AST: 18 U/L (ref 10–35)
Albumin: 4.2 g/dL (ref 3.6–5.1)
Alkaline Phosphatase: 43 U/L (ref 33–130)
BILIRUBIN DIRECT: 0.2 mg/dL (ref <=0.2)
BILIRUBIN INDIRECT: 0.8 mg/dL (ref 0.2–1.2)
TOTAL PROTEIN: 6.3 g/dL (ref 6.1–8.1)
Total Bilirubin: 1 mg/dL (ref 0.2–1.2)

## 2016-04-01 LAB — BASIC METABOLIC PANEL WITH GFR
BUN: 18 mg/dL (ref 7–25)
CALCIUM: 9.1 mg/dL (ref 8.6–10.4)
CHLORIDE: 102 mmol/L (ref 98–110)
CO2: 26 mmol/L (ref 20–31)
Creat: 0.58 mg/dL — ABNORMAL LOW (ref 0.60–0.93)
GFR, EST NON AFRICAN AMERICAN: 89 mL/min (ref >=60)
GFR, Est African American: >89 mL/min (ref >=60)
GLUCOSE: 93 mg/dL (ref 65–99)
Potassium: 4.1 mmol/L (ref 3.5–5.3)
Sodium: 140 mmol/L (ref 135–146)

## 2016-04-01 LAB — LIPID PANEL
Cholesterol: 136 mg/dL (ref 125–200)
HDL: 36 mg/dL — ABNORMAL LOW (ref >=46)
LDL CALC: 73 mg/dL (ref <130)
Total CHOL/HDL Ratio: 3.8 Ratio (ref <=5.0)
Triglycerides: 135 mg/dL (ref <150)
VLDL: 27 mg/dL (ref <30)

## 2016-04-01 LAB — CBC WITH DIFFERENTIAL/PLATELET
BASOS ABS: 0 {cells}/uL (ref 0–200)
Basophils Relative: 0 %
Eosinophils Absolute: 225 cells/uL (ref 15–500)
Eosinophils Relative: 3 %
HEMATOCRIT: 41.4 % (ref 35.0–45.0)
HEMOGLOBIN: 13.4 g/dL (ref 11.7–15.5)
LYMPHS ABS: 1800 {cells}/uL (ref 850–3900)
Lymphocytes Relative: 24 %
MCH: 30.7 pg (ref 27.0–33.0)
MCHC: 32.4 g/dL (ref 32.0–36.0)
MCV: 94.7 fL (ref 80.0–100.0)
MONO ABS: 825 {cells}/uL (ref 200–950)
MPV: 10.5 fL (ref 7.5–12.5)
Monocytes Relative: 11 %
NEUTROS ABS: 4650 {cells}/uL (ref 1500–7800)
NEUTROS PCT: 62 %
Platelets: 194 10*3/uL (ref 140–400)
RBC: 4.37 MIL/uL (ref 3.80–5.10)
RDW: 14.8 % (ref 11.0–15.0)
WBC: 7.5 10*3/uL (ref 3.8–10.8)

## 2016-04-01 LAB — MAGNESIUM: Magnesium: 2 mg/dL (ref 1.5–2.5)

## 2016-04-01 NOTE — Patient Instructions (Signed)

## 2016-04-01 NOTE — Progress Notes (Signed)
[facility] Unk Pinto, M.D.        Uvaldo Bristle. Silverio Lay, P.A.-C       Starlyn Skeans, P.A.-C  [facility] [place]                [address] [place] [id number] Telephone [phone] Telefax [phone] ______________________________________________________________________     This very nice 80 y.o. MWF presents for 6 month follow up with Hypertension, Hyperlipidemia, T2_NIDDM and Vitamin D Deficiency. Today she also report 2-3 day hx/o increasing LLQ cramping & tenderness. Denies fevers, chills, rashes or GI sx's otherwise. Patient does have hx/o GERD controlled with diet & infrequent Ranitidine.     Patient is treated for HTN circa 1992 & BP has been controlled at home. Today's BP is  138/66. Patient has had no complaints of any cardiac type chest pain, palpitations, dyspnea/orthopnea/PND, dizziness, claudication, or dependent edema.     Hyperlipidemia is controlled with diet & meds. Patient denies myalgias or other med SE's. Last Lipids were at goal: Lab Results  Component Value Date   CHOL 125 12/27/2015   HDL 35 (L) 12/27/2015   LDLCALC 63 12/27/2015   TRIG 133 12/27/2015   CHOLHDL 3.6 12/27/2015      Also, the patient has history of T2_NIDDM predating since 2009 and she reports CBG's are ranging in the 80-90's and has had no symptoms of reactive hypoglycemia, diabetic polys, paresthesias or visual blurring.  Last A1c was near goal: Lab Results  Component Value Date   HGBA1C 5.9 (H) 12/27/2015      Further, the patient also has history of Vitamin D Deficiency of "42" on treatment in 2009 and supplements vitamin D without any suspected side-effects. Last vitamin D was at goal: Lab Results  Component Value Date   VD25OH 73 09/18/2015   Current Outpatient Prescriptions on File Prior to Visit  Medication Sig  . aspirin 81 MG  Take 81 mg by mouth daily.  Marland Kitchen atenolol 100 MG  Take 1 tablet  daily for BP  . atorvastatin  80 MG Take 1 tablet by mouth  every day for cholesterol  . CALCITRATE  Take 500 mg by mouth daily.  Marland Kitchen VITAMIN D Take 5,000 Units by mouth 2 (two) times daily.   Marland Kitchen lisinopril  20 MG  Take 1 tablet by mouth in  the evening for blood  pressure  . meloxicam  15 MG  Take 1 tablet by mouth  daily as needed for pain    . metFORMIN-XR 500 MG  TAKE 1 TAB w/BFST & LUNCH AND 2  TAB w/  SUPPER   . MILK THISTLE PO Take2 (two) times daily.  . Multiple Vitamin Take 1 tablet by mouth daily.  . nortriptyline  10 MG 1-2 at bed time for nerve pain.  . ranitidine 300 MG  Take 1 tablet (300 mg total) by mouth at bedtime.  . verapamil 120 MG  Take 1 tablet by mouth  twice a day  . vitamin B-12  1000 MCG  Take 1,000 mcg by mouth daily.   Allergies  Allergen Reactions  . Diltiazem    PMHx:   Past Medical History:  Diagnosis Date  . DDD (degenerative disc disease), lumbar   . Fatty liver disease, nonalcoholic    CT AB 0000000  . Hyperlipidemia   . Hypertension   . Type II or unspecified  type diabetes mellitus without mention of complication, not stated as uncontrolled   . Vitamin D deficiency    Immunization History  Administered Date(s) Administered  . H1N1 06/28/2008  . Influenza Whole 05/04/2013  . Influenza, High Dose Seasonal PF 04/26/2014, 04/26/2015, 04/01/2016  . Pneumococcal Conjugate-13 04/26/2014  . Pneumococcal Polysaccharide-23 07/10/2011  . Td 07/10/2011  . Zoster 06/28/2008   Past Surgical History:  Procedure Laterality Date  . APPENDECTOMY    . BRAIN SURGERY    . KNEE ARTHROSCOPY Left   . MASTECTOMY Bilateral    30 years ago   FHx:    Reviewed / unchanged  SHx:    Reviewed / unchanged  Systems Review:  Constitutional: Denies fever, chills, wt changes, headaches, insomnia, fatigue, night sweats, change in appetite. Eyes: Denies redness, blurred vision, diplopia, discharge, itchy, watery eyes.  ENT: Denies discharge, congestion, post nasal drip,  epistaxis, sore throat, earache, hearing loss, dental pain, tinnitus, vertigo, sinus pain, snoring.  CV: Denies chest pain, palpitations, irregular heartbeat, syncope, dyspnea, diaphoresis, orthopnea, PND, claudication or edema. Respiratory: denies cough, dyspnea, DOE, pleurisy, hoarseness, laryngitis, wheezing.  Gastrointestinal: Denies dysphagia, odynophagia, heartburn, reflux, water brash, nausea, vomiting, bloating, diarrhea, constipation, hematemesis, melena, hematochezia  or hemorrhoids. Has recent LLQ discomfort above. Genitourinary: Denies dysuria, frequency, urgency, nocturia, hesitancy, discharge, hematuria or flank pain. Musculoskeletal: Denies arthralgias, myalgias, stiffness, jt. swelling, pain, limping or strain/sprain.  Skin: Denies pruritus, rash, hives, warts, acne, eczema or change in skin lesion(s). Neuro: No weakness, tremor, incoordination, spasms, paresthesia or pain. Psychiatric: Denies confusion, memory loss or sensory loss. Endo: Denies change in weight, skin or hair change.  Heme/Lymph: No excessive bleeding, bruising or enlarged lymph nodes.  Physical Exam BP 138/66   Pulse 72   Temp 97.1 F (36.2 C)   Resp 16   Ht 5' 2.5" (1.588 m)   Wt 160 lb (72.6 kg)   BMI 28.80 kg/m   Appears Over nourished and in no distress.  Eyes: PERRLA, EOMs, conjunctiva no swelling or erythema. Sinuses: No frontal/maxillary tenderness ENT/Mouth: EAC's clear, TM's nl w/o erythema, bulging. Nares clear w/o erythema, swelling, exudates. Oropharynx clear without erythema or exudates. Oral hygiene is good. Tongue normal, non obstructing. Hearing intact.  Neck: Supple. Thyroid nl. Car 2+/2+ without bruits, nodes or JVD. Chest: Respirations nl with BS clear & equal w/o rales, rhonchi, wheezing or stridor.  Cor: Heart sounds normal w/ regular rate and rhythm without sig. murmurs, gallops, clicks, or rubs. Peripheral pulses normal and equal  without edema.  Abdomen: Soft & bowel sounds  normal. Non-tender w/o guarding, rebound, hernias, masses, or organomegaly.  Lymphatics: Unremarkable.  Musculoskeletal: Full ROM all peripheral extremities, joint stability, 5/5 strength, and normal gait.  Skin: Warm, dry without exposed rashes, lesions or ecchymosis apparent.  Neuro: Cranial nerves intact, reflexes equal bilaterally. Sensory-motor testing grossly intact. Tendon reflexes grossly intact.  Pysch: Alert & oriented x 3.  Insight and judgement nl & appropriate. No ideations.  Assessment and Plan:  1. Essential hypertension  - Continue medication, monitor blood pressure at home. Continue DASH diet. Reminder to go to the ER if any CP, SOB, nausea, dizziness, severe HA, changes vision/speech, left arm numbness and tingling and jaw pain. - TSH  2. Mixed hyperlipidemia  - Continue diet/meds, exercise,& lifestyle modifications. Continue monitor periodic cholesterol/liver & renal functions  - Lipid panel - TSH  3. 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 Monitor appropriate labs. - Hemoglobin A1c - Insulin, random  4. Vitamin  D deficiency  - Continue supplementation.  - VITAMIN D 25 Hydroxy   5. Medication management  - CBC with Differential/Platelet - BASIC METABOLIC PANEL WITH GFR - Hepatic function panel - Magnesium  6. Need for prophylactic vaccination and inoculation against influenza  - Flu vaccine HIGH DOSE PF (Fluzone High dose)   Recommended regular exercise, BP monitoring, weight control, and discussed med and SE's. Recommended labs to assess and monitor clinical status. Further disposition pending results of labs. Over 30 minutes of exam, counseling, chart review was performed

## 2016-04-02 LAB — HEMOGLOBIN A1C
HEMOGLOBIN A1C: 5.8 % — AB (ref <5.7)
MEAN PLASMA GLUCOSE: 120 mg/dL

## 2016-04-02 LAB — VITAMIN D 25 HYDROXY (VIT D DEFICIENCY, FRACTURES): VIT D 25 HYDROXY: 60 ng/mL (ref 30–100)

## 2016-04-02 LAB — INSULIN, RANDOM: INSULIN: 19.6 u[IU]/mL (ref 2.0–19.6)

## 2016-04-06 ENCOUNTER — Other Ambulatory Visit: Payer: Self-pay | Admitting: Internal Medicine

## 2016-04-23 ENCOUNTER — Encounter: Payer: Self-pay | Admitting: Physician Assistant

## 2016-04-23 ENCOUNTER — Ambulatory Visit (INDEPENDENT_AMBULATORY_CARE_PROVIDER_SITE_OTHER): Payer: Medicare Other | Admitting: Physician Assistant

## 2016-04-23 VITALS — BP 140/80 | HR 72 | Temp 97.7°F | Resp 16 | Ht 62.5 in | Wt 158.6 lb

## 2016-04-23 DIAGNOSIS — M542 Cervicalgia: Secondary | ICD-10-CM | POA: Diagnosis not present

## 2016-04-23 MED ORDER — MELOXICAM 15 MG PO TABS: ORAL_TABLET | ORAL | 1 refills | Status: DC

## 2016-04-23 MED ORDER — DEXAMETHASONE SODIUM PHOSPHATE 100 MG/10ML IJ SOLN
10.0000 mg | Freq: Once | INTRAMUSCULAR | Status: AC
  Administered 2016-04-23: 10 mg via INTRAMUSCULAR

## 2016-04-23 MED ORDER — CYCLOBENZAPRINE HCL 10 MG PO TABS: 10.0000 mg | ORAL_TABLET | Freq: Three times a day (TID) | ORAL | 0 refills | Status: DC | PRN

## 2016-04-23 NOTE — Patient Instructions (Signed)
Get on the mobic once daily with food Take the flexeril 1 pill as needed up to 3 a day Take the nortriptyline can take 1-2 at night and up to 1 pill 3 x a day  Will schedule with Dr. Arnoldo Morale Stay on the cipro but stop the flagyl for now  Acute Torticollis Torticollis is a condition in which the muscles of the neck tighten (contract) abnormally, causing the neck to twist and the head to move into an unnatural position. Torticollis that develops suddenly is called acute torticollis. If torticollis becomes chronic and is left untreated, the face and neck can become deformed. CAUSES This condition may be caused by:  Sleeping in an awkward position (common).  Extending or twisting the neck muscles beyond their normal position.  Infection. In some cases, the cause may not be known. SYMPTOMS Symptoms of this condition include:  An unnatural position of the head.  Neck pain.  A limited ability to move the neck.  Twisting of the neck to one side. DIAGNOSIS This condition is diagnosed with a physical exam. You may also have imaging tests, such as an X-ray, CT scan, or MRI. TREATMENT Treatment for this condition involves trying to relax the neck muscles. It may include:  Medicines or shots.  Physical therapy.  Surgery. This may be done in severe cases. HOME CARE INSTRUCTIONS  Take medicines only as directed by your health care provider.  Do stretching exercises and massage your neck as directed by your health care provider.  Keep all follow-up visits as directed by your health care provider. This is important. SEEK MEDICAL CARE IF:  You develop a fever. SEEK IMMEDIATE MEDICAL CARE IF:  You develop difficulty breathing.  You develop noisy breathing (stridor).  You start drooling.  You have trouble swallowing or have pain with swallowing.  You develop numbness or weakness in your hands or feet.  You have changes in your speech, understanding, or vision.  Your pain  gets worse.   This information is not intended to replace advice given to you by your health care provider. Make sure you discuss any questions you have with your health care provider.   Document Released: 06/28/2000 Document Revised: 11/15/2014 Document Reviewed: 06/27/2014 Elsevier Interactive Patient Education 2016 Else[employer]   Cervical Sprain A cervical sprain is an injury in the neck in which the strong, fibrous tissues (ligaments) that connect your neck bones stretch or tear. Cervical sprains can range from mild to severe. Severe cervical sprains can cause the neck vertebrae to be unstable. This can lead to damage of the spinal cord and can result in serious nervous system problems. The amount of time it takes for a cervical sprain to get better depends on the cause and extent of the injury. Most cervical sprains heal in 1 to 3 weeks. CAUSES  Severe cervical sprains may be caused by:   Contact sport injuries (such as from football, rugby, wrestling, hockey, auto racing, gymnastics, diving, martial arts, or boxing).   Motor vehicle collisions.   Whiplash injuries. This is an injury from a sudden forward and backward whipping movement of the head and neck.  Falls.  Mild cervical sprains may be caused by:   Being in an awkward position, such as while cradling a telephone between your ear and shoulder.   Sitting in a chair that does not offer proper support.   Working at a poorly Landscape architect station.   Looking up or down for long periods of time.  SYMPTOMS  Pain, soreness, stiffness, or a burning sensation in the front, back, or sides of the neck. This discomfort may develop immediately after the injury or slowly, 24 hours or more after the injury.   Pain or tenderness directly in the middle of the back of the neck.   Shoulder or upper back pain.   Limited ability to move the neck.   Headache.   Dizziness.   Weakness, numbness, or tingling in the  hands or arms.   Muscle spasms.   Difficulty swallowing or chewing.   Tenderness and swelling of the neck.  DIAGNOSIS  Most of the time your health care provider can diagnose a cervical sprain by taking your history and doing a physical exam. Your health care provider will ask about previous neck injuries and any known neck problems, such as arthritis in the neck. X-rays may be taken to find out if there are any other problems, such as with the bones of the neck. Other tests, such as a CT scan or MRI, may also be needed.  TREATMENT  Treatment depends on the severity of the cervical sprain. Mild sprains can be treated with rest, keeping the neck in place (immobilization), and pain medicines. Severe cervical sprains are immediately immobilized. Further treatment is done to help with pain, muscle spasms, and other symptoms and may include:  Medicines, such as pain relievers, numbing medicines, or muscle relaxants.   Physical therapy. This may involve stretching exercises, strengthening exercises, and posture training. Exercises and improved posture can help stabilize the neck, strengthen muscles, and help stop symptoms from returning.  HOME CARE INSTRUCTIONS   Put ice on the injured area.   Put ice in a plastic bag.   Place a towel between your skin and the bag.   Leave the ice on for 15-20 minutes, 3-4 times a day.   If your injury was severe, you may have been given a cervical collar to wear. A cervical collar is a two-piece collar designed to keep your neck from moving while it heals.  Do not remove the collar unless instructed by your health care provider.  If you have long hair, keep it outside of the collar.  Ask your health care provider before making any adjustments to your collar. Minor adjustments may be required over time to improve comfort and reduce pressure on your chin or on the back of your head.  Ifyou are allowed to remove the collar for cleaning or bathing,  follow your health care provider's instructions on how to do so safely.  Keep your collar clean by wiping it with mild soap and water and drying it completely. If the collar you have been given includes removable pads, remove them every 1-2 days and hand wash them with soap and water. Allow them to air dry. They should be completely dry before you wear them in the collar.  If you are allowed to remove the collar for cleaning and bathing, wash and dry the skin of your neck. Check your skin for irritation or sores. If you see any, tell your health care provider.  Do not drive while wearing the collar.   Only take over-the-counter or prescription medicines for pain, discomfort, or fever as directed by your health care provider.   Keep all follow-up appointments as directed by your health care provider.   Keep all physical therapy appointments as directed by your health care provider.   Make any needed adjustments to your workstation to promote good posture.   Avoid  positions and activities that make your symptoms worse.   Warm up and stretch before being active to help prevent problems.  SEEK MEDICAL CARE IF:   Your pain is not controlled with medicine.   You are unable to decrease your pain medicine over time as planned.   Your activity level is not improving as expected.  SEEK IMMEDIATE MEDICAL CARE IF:   You develop any bleeding.  You develop stomach upset.  You have signs of an allergic reaction to your medicine.   Your symptoms get worse.   You develop new, unexplained symptoms.   You have numbness, tingling, weakness, or paralysis in any part of your body.  MAKE SURE YOU:   Understand these instructions.  Will watch your condition.  Will get help right away if you are not doing well or get worse.   This information is not intended to replace advice given to you by your health care provider. Make sure you discuss any questions you have with your health  care provider.   Document Released: 04/28/2007 Document Revised: 07/06/2013 Document Reviewed: 01/06/2013 Elsevier Interactive Patient Education [employer].

## 2016-04-23 NOTE — Progress Notes (Signed)
Subjective:    Patient ID: Debra Collins, female    DOB: 03/27/1936, 80 y.o.   MRN: [id number]  HPI 80 y.o. WF with history of DDD (lumbar surgery Dr. Malva Cogan. Lorin Mercy, cervical neck surgery 15+ years ago Dr. Arnoldo Morale) presents for neck pain.  She states that she saw Dr. Melford Aase on 09/18 was given cipro/flaygl, pain resolved but came back, has been on it since Sunday, AB is getting better. Started with neck pain x sat morning, no injury, worse on the left than right, several weeks ago had similar thing on the right but it went away. This is much more severe. Has not taken any medications for it. No radicular symptoms in her arms/hands, no fever or chills, no HA/nausea, no sensitivity to light/sound, changes in vision. Remote history of cervical neck surgery 15 + years ago with Dr. Arnoldo Morale. She is on nortriptyline, takes at night PRN for leg pain, has not taken recently.   Blood pressure 140/80, pulse 72, temperature 97.7 F (36.5 C), resp. rate 16, height 5' 2.5" (1.588 m), weight 158 lb 9.6 oz (71.9 kg), SpO2 96 %.   Medications Current Outpatient Prescriptions on File Prior to Visit  Medication Sig  . aspirin 81 MG tablet Take 81 mg by mouth daily.  Marland Kitchen atenolol (TENORMIN) 100 MG tablet Take 1 tablet daily for BP  . atorvastatin (LIPITOR) 80 MG tablet Take 1 tablet by mouth  every day for cholesterol  . Calcium Citrate (CALCITRATE PO) Take 500 mg by mouth daily.  . Cholecalciferol (VITAMIN D PO) Take 5,000 Units by mouth 2 (two) times daily.   Marland Kitchen glucose blood test strip Check glucose daily  . lisinopril (PRINIVIL,ZESTRIL) 20 MG tablet Take 1 tablet by mouth in  the evening for blood  pressure  . meloxicam (MOBIC) 15 MG tablet Take 1 tablet by mouth  daily as needed for pain  and inflammation  . metFORMIN (GLUCOPHAGE-XR) 500 MG 24 hr tablet TAKE 1 TABLET BY MOUTH WITH BREAKFAST AND LUNCH AND 2  WITH SUPPER FOR DIABETES  . MILK THISTLE PO Take by mouth 2 (two) times daily.  . Multiple Vitamin  (MULTIVITAMIN) tablet Take 1 tablet by mouth daily.  . nortriptyline (PAMELOR) 10 MG capsule 1-2 at bed time for nerve pain.  . ranitidine (ZANTAC) 300 MG tablet Take 1 tablet (300 mg total) by mouth at bedtime.  . verapamil (CALAN) 120 MG tablet Take 1 tablet by mouth  twice a day  . vitamin B-12 (CYANOCOBALAMIN) 1000 MCG tablet Take 1,000 mcg by mouth daily.   No current facility-administered medications on file prior to visit.     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  Past Surgical History:  Procedure Laterality Date  . APPENDECTOMY    . BRAIN SURGERY    . KNEE ARTHROSCOPY Left   . MASTECTOMY Bilateral    30 years ago    Review of Systems  Constitutional: Negative.  Negative for chills, fatigue and fever.  HENT: Negative.  Negative for congestion, sore throat and trouble swallowing.   Respiratory: Negative.   Cardiovascular: Negative.   Genitourinary: Negative.   Musculoskeletal: Positive for neck pain and neck stiffness. Negative for arthralgias, back pain, gait problem, joint swelling and myalgias.  Skin: Negative.  Negative for rash.  Neurological: Negative.  Negative for dizziness, tremors, seizures, syncope, facial asymmetry, speech difficulty, weakness, light-headedness, numbness and headaches.  Psychiatric/Behavioral: Negative for agitation and confusion.       Objective:   Physical Exam  Constitutional: She is oriented to person, place, and time. She appears well-developed and well-nourished.  HENT:  Head: Normocephalic and atraumatic.  Eyes: Conjunctivae are normal. Pupils are equal, round, and reactive to light.  Neck: Normal range of motion. Neck supple.  Cardiovascular: Normal rate and regular rhythm.   Pulmonary/Chest: Effort normal and breath sounds normal.  Abdominal: Soft. Bowel sounds are normal.  There is no tenderness.  Musculoskeletal:  limited flexion, right rotation and left rotation, without spinous process tenderness, with paraspinal muscle tenderness the left side, normal sensation, reflexes, and pulses distal. Good strength/grip bilateral  Lymphadenopathy:    She has no cervical adenopathy.  Neurological: She is alert and oriented to person, place, and time. She has normal reflexes.  Skin: Skin is warm and dry. No rash noted.       Assessment & Plan:  Neck pain left sided No evidence of radiculopathy, no fever, chills signs of meningitis Likely spasm versus disc Mobic, flexeril, take the nortriptyline at night, heat, stretches, follow up Dr.Jenkins A trigger point injection was performed at the site of maximal tenderness using 1% plain Lidocaine and dexamethasone. This was well tolerated, and followed by relief of pain.

## 2016-05-29 ENCOUNTER — Other Ambulatory Visit: Payer: Self-pay | Admitting: Physician Assistant

## 2016-05-29 MED ORDER — DICLOFENAC SODIUM 2 % TD SOLN: TRANSDERMAL | 3 refills | Status: DC

## 2016-05-30 ENCOUNTER — Other Ambulatory Visit: Payer: Self-pay | Admitting: Physician Assistant

## 2016-05-30 MED ORDER — DICLOFENAC SODIUM 1 % TD GEL: 4.0000 g | Freq: Four times a day (QID) | TRANSDERMAL | 2 refills | Status: DC

## 2016-06-28 ENCOUNTER — Other Ambulatory Visit: Payer: Self-pay | Admitting: Internal Medicine

## 2016-07-02 ENCOUNTER — Encounter: Payer: Self-pay | Admitting: Physician Assistant

## 2016-07-02 ENCOUNTER — Ambulatory Visit (INDEPENDENT_AMBULATORY_CARE_PROVIDER_SITE_OTHER): Payer: Medicare Other | Admitting: Physician Assistant

## 2016-07-02 VITALS — BP 120/72 | HR 76 | Temp 97.3°F | Resp 14 | Ht 62.5 in | Wt 165.0 lb

## 2016-07-02 DIAGNOSIS — R519 Headache, unspecified: Secondary | ICD-10-CM

## 2016-07-02 DIAGNOSIS — R51 Headache: Secondary | ICD-10-CM

## 2016-07-02 DIAGNOSIS — I10 Essential (primary) hypertension: Secondary | ICD-10-CM | POA: Diagnosis not present

## 2016-07-02 DIAGNOSIS — Z79899 Other long term (current) drug therapy: Secondary | ICD-10-CM | POA: Diagnosis not present

## 2016-07-02 DIAGNOSIS — E559 Vitamin D deficiency, unspecified: Secondary | ICD-10-CM | POA: Diagnosis not present

## 2016-07-02 DIAGNOSIS — E1121 Type 2 diabetes mellitus with diabetic nephropathy: Secondary | ICD-10-CM | POA: Diagnosis not present

## 2016-07-02 DIAGNOSIS — R8290 Unspecified abnormal findings in urine: Secondary | ICD-10-CM | POA: Diagnosis not present

## 2016-07-02 DIAGNOSIS — E782 Mixed hyperlipidemia: Secondary | ICD-10-CM

## 2016-07-02 LAB — CBC WITH DIFFERENTIAL/PLATELET
BASOS PCT: 1 %
Basophils Absolute: 66 cells/uL (ref 0–200)
EOS PCT: 2 %
Eosinophils Absolute: 132 cells/uL (ref 15–500)
HCT: 43 % (ref 35.0–45.0)
HEMOGLOBIN: 14.1 g/dL (ref 11.7–15.5)
LYMPHS ABS: 1980 {cells}/uL (ref 850–3900)
Lymphocytes Relative: 30 %
MCH: 31.8 pg (ref 27.0–33.0)
MCHC: 32.8 g/dL (ref 32.0–36.0)
MCV: 96.8 fL (ref 80.0–100.0)
MONOS PCT: 11 %
MPV: 10.3 fL (ref 7.5–12.5)
Monocytes Absolute: 726 cells/uL (ref 200–950)
NEUTROS ABS: 3696 {cells}/uL (ref 1500–7800)
Neutrophils Relative %: 56 %
PLATELETS: 209 10*3/uL (ref 140–400)
RBC: 4.44 MIL/uL (ref 3.80–5.10)
RDW: 15.4 % — ABNORMAL HIGH (ref 11.0–15.0)
WBC: 6.6 10*3/uL (ref 3.8–10.8)

## 2016-07-02 LAB — TSH: TSH: 2.43 mIU/L

## 2016-07-02 MED ORDER — CARBAMAZEPINE 200 MG PO TABS: 200.0000 mg | ORAL_TABLET | Freq: Two times a day (BID) | ORAL | 0 refills | Status: DC

## 2016-07-02 NOTE — Patient Instructions (Addendum)
Start on tegretol 1/2 pill or 100mg  at night for 1 week, then increase to 200mg  at night for 1 week, then start 1/2 pill in the morning and 1 pill at night for 1-2 week, then can go up to 1 pill twice daily follow up in 4-6 weeks or can stop and see Dr. Arnoldo Morale   Trigeminal Neuralgia Trigeminal neuralgia is a nerve disorder that causes attacks of severe facial pain. The attacks last from a few seconds to several minutes. They can happen for days, weeks, or months and then go away for months or years. Trigeminal neuralgia is also called tic douloureux. What are the causes? This condition is caused by damage to a nerve in the face that is called the trigeminal nerve. An attack can be triggered by:  Talking.  Chewing.  Putting on makeup.  Washing your face.  Shaving your face.  Brushing your teeth.  Touching your face. What increases the risk? This condition is more likely to develop in:  Women.  People who are 47 years of age or older. What are the signs or symptoms? The main symptom of this condition is pain in the jaw, lips, eyes, nose, scalp, forehead, and face. The pain may be intense, stabbing, electric, or shock-like. How is this diagnosed? This condition is diagnosed with a physical exam. A CT scan or MRI may be done to rule out other conditions that can cause facial pain. How is this treated? This condition may be treated with:  Avoiding the things that trigger your attacks.  Pain medicine.  Surgery. This may be done in severe cases if other medical treatment does not provide relief. Follow these instructions at home:  Take over-the-counter and prescription medicines only as told by your health care provider.  If you wish to get pregnant, talk with your health care provider before you start trying to get pregnant.  Avoid the things that trigger your attacks. It may help to:  Chew on the unaffected side of your mouth.  Avoid touching your face.  Avoid blasts of  hot or cold air. Contact a health care provider if:  Your pain medicine is not helping.  You develop new, unexplained symptoms, such as:  Double vision.  Facial weakness.  Changes in hearing or balance.  You become pregnant. Get help right away if:  Your pain is unbearable, and your pain medicine does not help. This information is not intended to replace advice given to you by your health care provider. Make sure you discuss any questions you have with your health care provider. Document Released: 06/28/2000 Document Revised: 03/03/2016 Document Reviewed: 10/24/2014 Elsevier Interactive Patient Education  2017 [employer].

## 2016-07-02 NOTE — Progress Notes (Signed)
Assessment and Plan:  Hypertension: Continue medication, monitor blood pressure at home. Continue DASH diet. Cholesterol: Continue diet and exercise. Check cholesterol.  Diabetes with CKD-Continue diet and exercise. Check A1C Vitamin D Def- check level and continue medications.  Left lower radicular pain-follow up Dr. Lorin Mercy, continue meds Neck pain/left facial pain- no rash, normal neuro, check ESR, will try tegretol can refer to neuro surgeon/neurologist if not better, follow up 4 weeks Abnormal urine- check urine  Continue diet and meds as discussed. Further disposition pending results of labs. Discussed med's effects and SE's.  Future Appointments Date Time Provider [place]  08/09/2016 11:00 AM Vicie Mutters, PA-C [facility] None  10/22/2016 10:00 AM Unk Pinto, MD [facility] None     HPI 80 y.o. female  presents for 3 month follow up with hypertension, hyperlipidemia, diabetes and vitamin D. Her blood pressure has been controlled at home, today their BP is BP: 120/72 She does not workout. She denies chest pain, shortness of breath, dizziness.  She is on cholesterol medication and denies myalgias. Her cholesterol is at goal. The cholesterol last visit was:   Lab Results  Component Value Date   CHOL 136 04/01/2016   HDL 36 (L) 04/01/2016   LDLCALC 73 04/01/2016   TRIG 135 04/01/2016   CHOLHDL 3.8 04/01/2016   She has been working on diet and exercise for Diabetes, but with diet and MF she is in the preDM range, she is on ACE, and denies hypoglycemia , polydipsia, polyuria and visual disturbances. Last A1C in the office was:  Lab Results  Component Value Date   HGBA1C 5.8 (H) 04/01/2016   Lab Results  Component Value Date   GFRNONAA 89 04/01/2016   Patient is on Vitamin D supplement. Lab Results  Component Value Date   VD25OH 60 04/01/2016     Has had history of lumbar surgery, states back is better but left leg has pain down it occ, no numbness/tingling, no  weakness. Has seen Dr Lorin Mercy. Went to [place] for thanksgiving and has been worse with back pain. She also has had continued neck pain left side, some pain left side of neck/head, some numbness on left side of head/neck. No dizziness, changes in vision/speech/hearing.  She has had abnormal urine, a lot of bubbles and abnormal "film" on top, denies abnormal stream, starting/stopping, fever, chills.   Current Medications:  Current Outpatient Prescriptions on File Prior to Visit  Medication Sig Dispense Refill  . aspirin 81 MG tablet Take 81 mg by mouth daily.    Marland Kitchen atenolol (TENORMIN) 100 MG tablet TAKE 1 TABLET BY MOUTH  DAILY FOR BLOOD PRESSURE 90 tablet 0  . atorvastatin (LIPITOR) 80 MG tablet Take 1 tablet by mouth  every day for cholesterol 90 tablet 1  . Calcium Citrate (CALCITRATE PO) Take 500 mg by mouth daily.    . Cholecalciferol (VITAMIN D PO) Take 5,000 Units by mouth 2 (two) times daily.     . cyclobenzaprine (FLEXERIL) 10 MG tablet Take 1 tablet (10 mg total) by mouth 3 (three) times daily as needed for muscle spasms. 30 tablet 0  . diclofenac sodium (VOLTAREN) 1 % GEL Apply 4 g topically 4 (four) times daily. 100 g 2  . glucose blood test strip Check glucose daily 100 each 99  . lisinopril (PRINIVIL,ZESTRIL) 20 MG tablet Take 1 tablet by mouth in  the evening for blood  pressure 90 tablet 1  . meloxicam (MOBIC) 15 MG tablet Take one daily with food for 2  weeks, can take with tylenol, can not take with aleve, iburpofen, then as needed daily for pain 30 tablet 1  . metFORMIN (GLUCOPHAGE-XR) 500 MG 24 hr tablet TAKE 1 TABLET BY MOUTH WITH BREAKFAST AND LUNCH AND 2  WITH SUPPER FOR DIABETES 360 tablet 1  . MILK THISTLE PO Take by mouth 2 (two) times daily.    . Multiple Vitamin (MULTIVITAMIN) tablet Take 1 tablet by mouth daily.    . nortriptyline (PAMELOR) 10 MG capsule 1-2 at bed time for nerve pain. 60 capsule 0  . verapamil (CALAN) 120 MG tablet Take 1 tablet by mouth  twice a day 180  tablet 1  . vitamin B-12 (CYANOCOBALAMIN) 1000 MCG tablet Take 1,000 mcg by mouth daily.     No current facility-administered medications on file prior to visit.    Medical History:  Past Medical History:  Diagnosis Date  . DDD (degenerative disc disease), lumbar   . Fatty liver disease, nonalcoholic    CT AB 8250  . Hyperlipidemia   . Hypertension   . Type II or unspecified type diabetes mellitus without mention of complication, not stated as uncontrolled   . Vitamin D deficiency    Allergies:  Allergies  Allergen Reactions  . Diltiazem     Review of Systems  Constitutional: Negative for chills, diaphoresis, fever and malaise/fatigue.  HENT: Negative for congestion, ear discharge, ear pain, hearing loss, sore throat and tinnitus.   Eyes: Negative.   Respiratory: Negative for cough, sputum production, shortness of breath and wheezing.   Cardiovascular: Negative.  Negative for chest pain and leg swelling.  Gastrointestinal: Negative.   Genitourinary: Negative.   Musculoskeletal: Positive for back pain. Negative for falls, joint pain, myalgias and neck pain.  Neurological: Positive for tingling (left face/neck with pain, worse with touch) and sensory change (pain left leg). Negative for dizziness, tremors, speech change, focal weakness, seizures, loss of consciousness and headaches.  Psychiatric/Behavioral: Negative.      Family history- Review and unchanged Social history- Review and unchanged Physical Exam: BP 120/72   Pulse 76   Temp 97.3 F (36.3 C)   Resp 14   Ht 5' 2.5" (1.588 m)   Wt 165 lb (74.8 kg)   SpO2 96%   BMI 29.70 kg/m  Wt Readings from Last 3 Encounters:  07/02/16 165 lb (74.8 kg)  04/23/16 158 lb 9.6 oz (71.9 kg)  04/01/16 160 lb (72.6 kg)   General Appearance: Well nourished, in no apparent distress. Eyes: PERRLA, EOMs, conjunctiva no swelling or erythema Sinuses: No Frontal/maxillary tenderness ENT/Mouth: Ext aud canals clear, TMs without  erythema, bulging. No erythema, swelling, or exudate on post pharynx.  Tonsils not swollen or erythematous. Hearing normal.  Neck: Supple, thyroid normal.  Respiratory: Respiratory effort normal, BS equal bilaterally without rales, rhonchi, wheezing or stridor.  Cardio: RRR with no MRGs. Brisk peripheral pulses without edema.  Abdomen: Soft, + BS.  Non tender, no guarding, rebound, hernias, masses. Lymphatics: Non tender without lymphadenopathy.  Musculoskeletal: Full ROM, 5/5 strength, normal gait. + left SI pain, normal sensation, strength distal.  Skin: Nontender temporal area. Warm, dry without rashes, lesions, ecchymosis.  Neuro: Cranial nerves intact. No cerebellar symptoms. Sensation intact.  Psych: Awake and oriented X 3, normal affect, Insight and Judgment appropriate.    Vicie Mutters 11:47 AM

## 2016-07-03 LAB — URINALYSIS, ROUTINE W REFLEX MICROSCOPIC
Bilirubin Urine: NEGATIVE
Hgb urine dipstick: NEGATIVE
KETONES UR: NEGATIVE
Leukocytes, UA: NEGATIVE
NITRITE: NEGATIVE
PH: 5.5 (ref 5.0–8.0)
Protein, ur: NEGATIVE
SPECIFIC GRAVITY, URINE: 1.017 (ref 1.001–1.035)

## 2016-07-03 LAB — LIPID PANEL
CHOL/HDL RATIO: 4.4 ratio (ref <5.0)
Cholesterol: 149 mg/dL (ref <200)
HDL: 34 mg/dL — AB (ref >50)
LDL Cholesterol: 66 mg/dL (ref <100)
Triglycerides: 244 mg/dL — ABNORMAL HIGH (ref <150)
VLDL: 49 mg/dL — AB (ref <30)

## 2016-07-03 LAB — HEPATIC FUNCTION PANEL
ALK PHOS: 43 U/L (ref 33–130)
ALT: 23 U/L (ref 6–29)
AST: 20 U/L (ref 10–35)
Albumin: 4.5 g/dL (ref 3.6–5.1)
BILIRUBIN DIRECT: 0.1 mg/dL (ref <=0.2)
BILIRUBIN INDIRECT: 0.8 mg/dL (ref 0.2–1.2)
BILIRUBIN TOTAL: 0.9 mg/dL (ref 0.2–1.2)
Total Protein: 6.6 g/dL (ref 6.1–8.1)

## 2016-07-03 LAB — BASIC METABOLIC PANEL WITH GFR
BUN: 14 mg/dL (ref 7–25)
CHLORIDE: 104 mmol/L (ref 98–110)
CO2: 25 mmol/L (ref 20–31)
CREATININE: 0.65 mg/dL (ref 0.60–0.93)
Calcium: 9.7 mg/dL (ref 8.6–10.4)
GFR, EST NON AFRICAN AMERICAN: 85 mL/min (ref >=60)
GFR, Est African American: >89 mL/min (ref >=60)
Glucose, Bld: 137 mg/dL — ABNORMAL HIGH (ref 65–99)
POTASSIUM: 4.2 mmol/L (ref 3.5–5.3)
SODIUM: 142 mmol/L (ref 135–146)

## 2016-07-03 LAB — URINE CULTURE

## 2016-07-03 LAB — HEMOGLOBIN A1C
HEMOGLOBIN A1C: 5.8 % — AB (ref <5.7)
Mean Plasma Glucose: 120 mg/dL

## 2016-07-03 LAB — MAGNESIUM: Magnesium: 2 mg/dL (ref 1.5–2.5)

## 2016-07-03 LAB — SEDIMENTATION RATE: Sed Rate: 5 mm/hr (ref 0–30)

## 2016-08-09 ENCOUNTER — Ambulatory Visit (INDEPENDENT_AMBULATORY_CARE_PROVIDER_SITE_OTHER): Payer: Medicare Other | Admitting: Physician Assistant

## 2016-08-09 ENCOUNTER — Encounter: Payer: Self-pay | Admitting: Physician Assistant

## 2016-08-09 VITALS — BP 152/80 | HR 72 | Temp 97.3°F | Ht 66.5 in | Wt 168.0 lb

## 2016-08-09 DIAGNOSIS — R102 Pelvic and perineal pain: Secondary | ICD-10-CM | POA: Diagnosis not present

## 2016-08-09 DIAGNOSIS — R51 Headache: Secondary | ICD-10-CM | POA: Diagnosis not present

## 2016-08-09 DIAGNOSIS — R519 Headache, unspecified: Secondary | ICD-10-CM

## 2016-08-09 MED ORDER — CARBAMAZEPINE 200 MG PO TABS: 200.0000 mg | ORAL_TABLET | Freq: Two times a day (BID) | ORAL | 1 refills | Status: DC

## 2016-08-09 MED ORDER — TRIAMCINOLONE ACETONIDE 0.5 % EX CREA: 1.0000 "application " | TOPICAL_CREAM | Freq: Two times a day (BID) | CUTANEOUS | 2 refills | Status: DC

## 2016-08-09 NOTE — Progress Notes (Signed)
Assessment and Plan: 1. Left facial pain Continue tegretol Follow up Dr. Arnoldo Morale  2. Suprapubic pain Check [place], has had normal urine, normal AB [place] - US Transvaginal Non-OB; Future - US Pelvis Complete; Future   Future Appointments Date Time Provider [place]  10/22/2016 10:00 AM Unk Pinto, MD [facility] None    HPI 81 y.o.female presents for follow up for left sided facial pain/numbness.  She also has had continued neck pain left side, some pain left side of neck/head, some numbness on left side of head/neck. No dizziness, changes in vision/speech/hearing. She states the tegretol did help, no AE's, helped with facial pain, has been off of it for several days and would like a prescription, did not make an appointment with Dr. Arnoldo Morale. Woke up with rash x 2 days on back and arms, husband treated for similar rash.   Has been having suprapubic bloating/discomfort, no diarrhea, constipation, urinary symptoms, bleeding, fever, chills. Normal AB [place] 2016.    Past Medical History:  Diagnosis Date  . DDD (degenerative disc disease), lumbar   . Fatty liver disease, nonalcoholic    CT AB 0000000  . Hyperlipidemia   . Hypertension   . Type II or unspecified type diabetes mellitus without mention of complication, not stated as uncontrolled   . Vitamin D deficiency      Allergies  Allergen Reactions  . Diltiazem     Current Outpatient Prescriptions on File Prior to Visit  Medication Sig  . aspirin 81 MG tablet Take 81 mg by mouth daily.  Marland Kitchen atenolol (TENORMIN) 100 MG tablet TAKE 1 TABLET BY MOUTH  DAILY FOR BLOOD PRESSURE  . atorvastatin (LIPITOR) 80 MG tablet Take 1 tablet by mouth  every day for cholesterol  . Calcium Citrate (CALCITRATE PO) Take 500 mg by mouth daily.  . carbamazepine (TEGRETOL) 200 MG tablet Take 1 tablet (200 mg total) by mouth 2 (two) times daily.  . Cholecalciferol (VITAMIN D PO) Take 5,000 Units by mouth 2 (two) times daily.   . cyclobenzaprine  (FLEXERIL) 10 MG tablet Take 1 tablet (10 mg total) by mouth 3 (three) times daily as needed for muscle spasms.  . diclofenac sodium (VOLTAREN) 1 % GEL Apply 4 g topically 4 (four) times daily.  Marland Kitchen glucose blood test strip Check glucose daily  . lisinopril (PRINIVIL,ZESTRIL) 20 MG tablet Take 1 tablet by mouth in  the evening for blood  pressure  . meloxicam (MOBIC) 15 MG tablet Take one daily with food for 2 weeks, can take with tylenol, can not take with aleve, iburpofen, then as needed daily for pain  . metFORMIN (GLUCOPHAGE-XR) 500 MG 24 hr tablet TAKE 1 TABLET BY MOUTH WITH BREAKFAST AND LUNCH AND 2  WITH SUPPER FOR DIABETES  . MILK THISTLE PO Take by mouth 2 (two) times daily.  . Multiple Vitamin (MULTIVITAMIN) tablet Take 1 tablet by mouth daily.  . nortriptyline (PAMELOR) 10 MG capsule 1-2 at bed time for nerve pain.  Marland Kitchen OVER THE COUNTER MEDICATION   . Probiotic Product (PROBIOTIC DAILY PO) Take by mouth.  . verapamil (CALAN) 120 MG tablet Take 1 tablet by mouth  twice a day  . vitamin B-12 (CYANOCOBALAMIN) 1000 MCG tablet Take 1,000 mcg by mouth daily.   No current facility-administered medications on file prior to visit.     ROS: all negative except above.   Physical Exam: Filed Weights   08/09/16 1058  Weight: 168 lb (76.2 kg)   BP (!) 152/80   Pulse  72   Temp 97.3 F (36.3 C)   Ht 5' 6.5" (1.689 m)   Wt 168 lb (76.2 kg)   SpO2 97%   BMI 26.71 kg/m  General Appearance: Well nourished, in no apparent distress. Eyes: PERRLA, EOMs, conjunctiva no swelling or erythema Sinuses: No Frontal/maxillary tenderness ENT/Mouth: Ext aud canals clear, TMs without erythema, bulging. No erythema, swelling, or exudate on post pharynx.  Tonsils not swollen or erythematous. Hearing normal.  Neck: Supple, thyroid normal.  Respiratory: Respiratory effort normal, BS equal bilaterally without rales, rhonchi, wheezing or stridor.  Cardio: RRR with no MRGs. Brisk peripheral pulses without  edema.  Abdomen: Soft, + BS, obese suprapubic tenderness, no guarding, rebound, hernias, masses. Lymphatics: Non tender without lymphadenopathy.  Musculoskeletal: Full ROM, 5/5 strength, normal gait. + left SI pain, normal sensation, strength distal.  Skin: Nontender temporal area. Warm, dry without rashes, lesions, ecchymosis.  Neuro: Cranial nerves intact. No cerebellar symptoms. Sensation intact.  Psych: Awake and oriented X 3, normal affect, Insight and Judgment appropriate.     Vicie Mutters, PA-C 11:21 AM [facility]

## 2016-08-09 NOTE — Patient Instructions (Addendum)
Start on tegretol 1/2 pill or 100mg  at night for 1 week, then increase to 200mg  at night for 1 week, then start 1/2 pill in the morning and 1 pill at night for 1-2 week, then can go up to 1 pill twice daily follow up in 4-6 weeks or can stop and see Dr. Arnoldo Morale    Pityriasis Rosea Introduction Pityriasis rosea is a rash that usually appears on the trunk of the body. It may also appear on the upper arms and upper legs. It usually begins as a single patch, and then more patches begin to develop. The rash may cause mild itching, but it normally does not cause other problems. It usually goes away without treatment. However, it may take weeks or months for the rash to go away completely. What are the causes? The cause of this condition is not known. The condition does not spread from person to person (is noncontagious). What increases the risk? This condition is more likely to develop in young adults and children. It is most common in the spring and fall. What are the signs or symptoms? The main symptom of this condition is a rash.  The rash usually begins with a single oval patch that is larger than the ones that follow. This is called a herald patch. It generally appears a week or more before the rest of the rash appears.  When more patches start to develop, they spread quickly on the trunk, back, and arms. These patches are smaller than the first one.  The patches that make up the rash are usually oval-shaped and pink or red in color. They are usually flat, but they may sometimes be raised so that they can be felt with a finger. They may also be finely crinkled and have a scaly ring around the edge.  The rash does not typically appear on areas of the skin that are exposed to the sun. Most people who have this condition do not have other symptoms, but some have mild itching. In a few cases, a mild headache or body aches may occur before the rash appears and then go away. How is this  diagnosed? Your health care provider may diagnose this condition by doing a physical exam and taking your medical history. To rule out other possible causes for the rash, the health care provider may order blood tests or take a skin sample from the rash to be looked at under a microscope. How is this treated? Usually, treatment is not needed for this condition. The rash will probably go away on its own in 4-8 weeks. In some cases, a health care provider may recommend or prescribe medicine to reduce itching. Follow these instructions at home:  Take medicines only as directed by your health care provider.  Avoid scratching the affected areas of skin.  Do not take hot baths or use a sauna. Use only warm water when bathing or showering. Heat can increase itching. Contact a health care provider if:  Your rash does not go away in 8 weeks.  Your rash gets much worse.  You have a fever.  You have swelling or pain in the rash area.  You have fluid, blood, or pus coming from the rash area. This information is not intended to replace advice given to you by your health care provider. Make sure you discuss any questions you have with your health care provider. Document Released: 08/07/2001 Document Revised: 12/07/2015 Document Reviewed: 06/08/2014  2017 Elsevier  GET THUMB SPICCA SPLINT  FROM PHARMACY ICE IT  Debra Collins Tenosynovitis Introduction Tendons attach muscles to bones. They also help with joint movements. When tendons become irritated or swollen, it is called tendinitis. The extensor pollicis brevis (EPB) tendon connects the EPB muscle to a bone that is near the base of the thumb. The EPB muscle helps to straighten and extend the thumb. [place] tenosynovitis is a condition in which the EPB tendon lining (sheath) becomes irritated, thickened, and swollen. This condition is sometimes called stenosing tenosynovitis. This condition causes pain on the thumb side of the back of the  wrist. What are the causes? Causes of this condition include:  Activities that repeatedly cause your thumb and wrist to extend.  A sudden increase in activity or change in activity that affects your wrist. What increases the risk? This condition is more likely to develop in:  Females.  People who have diabetes.  Women who have recently given birth.  People who are over 63 years of age.  People who do activities that involve repeated hand and wrist motions, such as tennis, racquetball, volleyball, gardening, and taking care of children.  People who do heavy labor.  People who have poor wrist strength and flexibility.  People who do not warm up properly before activities. What are the signs or symptoms? Symptoms of this condition include:  Pain or tenderness over the thumb side of the back of the wrist when your thumb and wrist are not moving.  Pain that gets worse when you straighten your thumb or extend your thumb or wrist.  Pain when the injured area is touched.  Locking or catching of the thumb joint while you bend and straighten your thumb.  Decreased thumb motion due to pain.  Swelling over the affected area. How is this diagnosed? This condition is diagnosed with a medical history and physical exam. Your health care provider will ask for details about your injury and ask about your symptoms. How is this treated? Treatment may include the use of icing and medicines to reduce pain and swelling. You may also be advised to wear a splint or brace to limit your thumb and wrist motion. In less severe cases, treatment may also include working with a physical therapist to strengthen your wrist and calm the irritation around your EPB tendon sheath. In severe cases, surgery may be needed. Follow these instructions at home: If you have a splint or brace:  Wear it as told by your health care provider. Remove it only as told by your health care provider.  Loosen the splint or  brace if your fingers become numb and tingle, or if they turn cold and blue.  Keep the splint or brace clean and dry. Managing pain, stiffness, and swelling  If directed, apply ice to the injured area.  Put ice in a plastic bag.  Place a towel between your skin and the bag.  Leave the ice on for 20 minutes, 2-3 times per day.  Move your fingers often to avoid stiffness and to lessen swelling.  Raise (elevate) the injured area above the level of your heart while you are sitting or lying down. General instructions  Return to your normal activities as told by your health care provider. Ask your health care provider what activities are safe for you.  Take over-the-counter and prescription medicines only as told by your health care provider.  Keep all follow-up visits as told by your health care provider. This is important.  Do not drive or operate  heavy machinery while taking prescription pain medicine. Contact a health care provider if:  Your pain, tenderness, or swelling gets worse, even if you have had treatment.  You have numbness or tingling in your wrist, hand, or fingers on the injured side. This information is not intended to replace advice given to you by your health care provider. Make sure you discuss any questions you have with your health care provider. Document Released: 07/01/2005 Document Revised: 12/07/2015 Document Reviewed: 09/06/2014  2017 Elsevier

## 2016-08-10 ENCOUNTER — Other Ambulatory Visit: Payer: Self-pay | Admitting: Internal Medicine

## 2016-08-12 ENCOUNTER — Other Ambulatory Visit: Payer: Self-pay | Admitting: *Deleted

## 2016-08-12 MED ORDER — ATENOLOL 100 MG PO TABS: 100.0000 mg | ORAL_TABLET | Freq: Every day | ORAL | 1 refills | Status: DC

## 2016-08-20 ENCOUNTER — Ambulatory Visit
Admission: RE | Admit: 2016-08-20 | Discharge: 2016-08-20 | Disposition: A | Discharge status: Home / Self Care | Payer: Medicare Other | Source: Ambulatory Visit | Attending: Physician Assistant | Admitting: Physician Assistant

## 2016-08-20 DIAGNOSIS — R102 Pelvic and perineal pain: Secondary | ICD-10-CM

## 2016-08-20 DIAGNOSIS — D259 Leiomyoma of uterus, unspecified: Secondary | ICD-10-CM | POA: Diagnosis not present

## 2016-09-04 ENCOUNTER — Ambulatory Visit (HOSPITAL_COMMUNITY)
Admission: RE | Admit: 2016-09-04 | Discharge: 2016-09-04 | Disposition: A | Discharge status: Home / Self Care | Payer: Medicare Other | Source: Ambulatory Visit | Attending: Internal Medicine | Admitting: Internal Medicine

## 2016-09-04 ENCOUNTER — Encounter (HOSPITAL_COMMUNITY): Payer: Self-pay

## 2016-09-04 ENCOUNTER — Other Ambulatory Visit: Payer: Self-pay | Admitting: Internal Medicine

## 2016-09-04 ENCOUNTER — Ambulatory Visit (INDEPENDENT_AMBULATORY_CARE_PROVIDER_SITE_OTHER): Payer: Medicare Other | Admitting: Internal Medicine

## 2016-09-04 ENCOUNTER — Encounter: Payer: Self-pay | Admitting: Internal Medicine

## 2016-09-04 VITALS — BP 142/84 | HR 72 | Temp 97.0°F | Resp 16 | Ht 62.5 in | Wt 163.6 lb

## 2016-09-04 DIAGNOSIS — X58XXXA Exposure to other specified factors, initial encounter: Secondary | ICD-10-CM | POA: Diagnosis not present

## 2016-09-04 DIAGNOSIS — M546 Pain in thoracic spine: Secondary | ICD-10-CM

## 2016-09-04 DIAGNOSIS — R079 Chest pain, unspecified: Secondary | ICD-10-CM

## 2016-09-04 DIAGNOSIS — I517 Cardiomegaly: Secondary | ICD-10-CM | POA: Insufficient documentation

## 2016-09-04 DIAGNOSIS — S2231XA Fracture of one rib, right side, initial encounter for closed fracture: Secondary | ICD-10-CM | POA: Diagnosis not present

## 2016-09-04 DIAGNOSIS — I700 Atherosclerosis of aorta: Secondary | ICD-10-CM | POA: Diagnosis not present

## 2016-09-04 DIAGNOSIS — R52 Pain, unspecified: Secondary | ICD-10-CM

## 2016-09-04 MED ORDER — HYDROCODONE-ACETAMINOPHEN 5-325 MG PO TABS: ORAL_TABLET | ORAL | 0 refills | Status: AC

## 2016-09-04 NOTE — Patient Instructions (Signed)
Rib Fracture A rib fracture is a break or crack in one of the bones of the ribs. The ribs are like a cage that goes around your upper chest. A broken or cracked rib is often painful, but most do not cause other problems. Most rib fractures heal on their own in 1-3 months. Follow these instructions at home:  Avoid activities that cause pain to the injured area. Protect your injured area.  Slowly increase activity as told by your doctor.  Take medicine as told by your doctor.  Put ice on the injured area for the first 1-2 days after you have been treated or as told by your doctor.  Put ice in a plastic bag.  Place a towel between your skin and the bag.  Leave the ice on for 15-20 minutes at a time, every 2 hours while you are awake.  Do deep breathing as told by your doctor. You may be told to:  Take deep breaths many times a day.  Cough many times a day while hugging a pillow.  Use a device (incentive spirometer) to perform deep breathing many times a day.  Drink enough fluids to keep your pee (urine) clear or pale yellow.  Do not wear a rib belt or binder. These do not allow you to breathe deeply. Get help right away if:  You have a fever.  You have trouble breathing.  You cannot stop coughing.  You cough up thick or bloody spit (mucus).  You feel sick to your stomach (nauseous), throw up (vomit), or have belly (abdominal) pain.  Your pain gets worse and medicine does not help. This information is not intended to replace advice given to you by your health care provider. Make sure you discuss any questions you have with your health care provider. Document Released: 04/09/2008 Document Revised: 12/07/2015 Document Reviewed: 09/02/2012 Elsevier Interactive Patient Education  2017 [employer]  

## 2016-09-04 NOTE — Progress Notes (Signed)
[place] [facility]   Unk Pinto, M.D.    Uvaldo Bristle. Silverio Lay, P.A.-C      Starlyn Skeans, P.A.-C  [facility]                [address] [place], [place] [id number] Telephone [phone] Telefax [phone]  Subjective:    Patient ID: Debra Collins, female    DOB: 1936-06-03, 81 y.o.   MRN: [id number]  HPI  Patient is a nice 81 yo MWF who was seen recently for L facial pain and suprapubic pain on 08/09/2016 and she had a pelvic /vag U/S which was negative. Now she presents with a 3 day hx/o Right chest & back pain described as sharp/stabbing and worse with positional changes as getting up or down to/from a sitting position. Systems review is negative to GI or GU sx's. Nl appetite & BM this am.   Medication Sig  . aspirin 81 MG tablet Take 81 mg by mouth daily.  Marland Kitchen atenolol (TENORMIN) 100 MG tablet Take 1 tablet (100 mg total) by mouth daily. for blood pressure  . atorvastatin (LIPITOR) 80 MG tablet Take 1 tablet by mouth  every day for cholesterol  . Calcium Citrate (CALCITRATE PO) Take 500 mg by mouth daily.  . carbamazepine (TEGRETOL) 200 MG tablet Take 1 tablet (200 mg total) by mouth 2 (two) times daily.  . Cholecalciferol (VITAMIN D PO) Take 5,000 Units by mouth 2 (two) times daily.   . cyclobenzaprine (FLEXERIL) 10 MG tablet Take 1 tablet (10 mg total) by mouth 3 (three) times daily as needed for muscle spasms.  . diclofenac sodium (VOLTAREN) 1 % GEL Apply 4 g topically 4 (four) times daily.  Marland Kitchen glucose blood test strip Check glucose daily  . lisinopril (PRINIVIL,ZESTRIL) 20 MG tablet TAKE 1 TABLET BY MOUTH IN  THE EVENING FOR BLOOD  PRESSURE  . meloxicam (MOBIC) 15 MG tablet Take one daily with food for 2 weeks  . metFORMIN-XR 500 MG 24 hr tablet TAKE 1 TABLET BY MOUTH WITH BREAKFAST AND LUNCH AND 2  WITH SUPPER FOR DIABETES  . MILK THISTLE PO Take by mouth 2 (two) times daily.  . Multiple Vitamin  (MULTIVITAMIN) tablet Take 1 tablet by mouth daily.  . nortriptyline (PAMELOR) 10 MG capsule 1-2 at bed time for nerve pain.  Marland Kitchen OVER THE COUNTER MEDICATION   . Probiotic Product (PROBIOTIC DAILY PO) Take by mouth.  . triamcinolone cream (KENALOG) 0.5 % Apply 1 application topically 2 (two) times daily.  . verapamil (CALAN) 120 MG tablet TAKE 1 TABLET BY MOUTH  TWICE A DAY  . vitamin B-12 (CYANOCOBALAMIN) 1000 MCG tablet Take 1,000 mcg by mouth daily.   Allergies  Allergen Reactions  . Diltiazem    Past Medical History:  Diagnosis Date  . DDD (degenerative disc disease), lumbar   . Fatty liver disease, nonalcoholic    CT AB 0000000  . Hyperlipidemia   . Hypertension   . Type II or unspecified type diabetes mellitus without mention of complication, not stated as uncontrolled   . Vitamin D deficiency    Review of Systems  10 point systems review negative except as above.    Objective:   Physical Exam  BP   142/84   P    72   T   97 F    R   16  Ht   5' 2.5"    Wt   163 lb 9.6 oz    BMI   29.45   Patient is in no distress, but in obvious moderate pain with position changes with sitting, lying, etc.   HEENT - Eac's patent. TM's Nl. EOM's full. PERRLA. NasoOroPharynx clear. Neck - supple. Nl Thyroid. Carotids 2+. Chest - Clear equal BS w/o Rales, rhonchi, wheezes. (+) exquisite tenderness in the Right mid axillary and right mid posterior chest wall and the right para-thoracic area.  Cor - Nl HS. RRR w/o sig murmur.  No edema. Abd -Soft w/o palpable masses, tenderness or guarding. BS nl. MS- FROM w/o deformities. Muscle power, tone and bulk Nl. Gait Nl. Neuro - No obvious Cr N abnormalities.  Nl w/o focal abnormalities.    Assessment & Plan:   1. Chest pain, r/o rib fx  - DG Ribs Unilateral Right; Future - HYDROcodone-acetaminophen (NORCO) 5-325 MG tablet; Take 1/2 to 1 tablet 4 x / day or every 4 hours if needed for pain  Dispense: 50 tablet; Refill: 0  2. Acute midline  thoracic back pain, r/o compression fx  - DG Thoracic Spine W/Swimmers; Future - HYDROcodone-acetaminophen (NORCO) 5-325 MG tablet; Take 1/2 to 1 tablet 4 x / day or every 4 hours if needed for pain  Dispense: 50 tablet; Refill: 0  - discussed meds, SE's and ROV 10 days or sooner if sx's change.

## 2016-09-13 ENCOUNTER — Ambulatory Visit (INDEPENDENT_AMBULATORY_CARE_PROVIDER_SITE_OTHER): Payer: Medicare Other | Admitting: Internal Medicine

## 2016-09-13 ENCOUNTER — Encounter: Payer: Self-pay | Admitting: Internal Medicine

## 2016-09-13 VITALS — BP 158/76 | HR 60 | Temp 97.7°F | Resp 16 | Ht 62.5 in | Wt 164.0 lb

## 2016-09-13 DIAGNOSIS — S2231XD Fracture of one rib, right side, subsequent encounter for fracture with routine healing: Secondary | ICD-10-CM

## 2016-09-13 DIAGNOSIS — I10 Essential (primary) hypertension: Secondary | ICD-10-CM | POA: Diagnosis not present

## 2016-09-13 NOTE — Progress Notes (Signed)
  Subjective:    Patient ID: Debra Collins, female    DOB: 17-Nov-1935, 81 y.o.   MRN: [id number]  HPI  This nice 81 yo MWF returns for 10 day f/u of a R 6th rib fx & is doing much better with only minimal c/o pain at this point in time - now approx 2 weeks from onset. She denies any cough , congestion or sputum pdn. DShe has been able to taper her pain meds to occasional and uinfrwquent use.    Meds/allergies reviewed.   Review of Systems  10 point systems review negative except as above.    Objective:   Physical Exam  BP (!) 158/76   Pulse 60   Temp 97.7 F (36.5 C)   Resp 16   Ht 5' 2.5" (1.588 m)   Wt 164 lb (74.4 kg)   BMI 29.52 kg/m    HEENT - WNL Neck - Supple CHEST - clear w/o splinting and BS equal & clear.  COR - RRR w/o sig MGR. No edema.  ABD - Benign MS- WNL NEURO - WNL    Assessment & Plan:  1)  HTN  - Monitor BP's and call if remains elevated   2) R 6th Rib Fx, healing  - ROV - prn

## 2016-10-17 ENCOUNTER — Encounter: Payer: Self-pay | Admitting: Internal Medicine

## 2016-10-17 ENCOUNTER — Ambulatory Visit (INDEPENDENT_AMBULATORY_CARE_PROVIDER_SITE_OTHER): Payer: Medicare Other | Admitting: Internal Medicine

## 2016-10-17 VITALS — BP 152/90 | HR 72 | Temp 97.8°F | Resp 16 | Ht 63.0 in | Wt 165.0 lb

## 2016-10-17 DIAGNOSIS — Z79899 Other long term (current) drug therapy: Secondary | ICD-10-CM

## 2016-10-17 DIAGNOSIS — Z136 Encounter for screening for cardiovascular disorders: Secondary | ICD-10-CM

## 2016-10-17 DIAGNOSIS — Z1212 Encounter for screening for malignant neoplasm of rectum: Secondary | ICD-10-CM

## 2016-10-17 DIAGNOSIS — R269 Unspecified abnormalities of gait and mobility: Secondary | ICD-10-CM

## 2016-10-17 DIAGNOSIS — E559 Vitamin D deficiency, unspecified: Secondary | ICD-10-CM | POA: Diagnosis not present

## 2016-10-17 DIAGNOSIS — E782 Mixed hyperlipidemia: Secondary | ICD-10-CM | POA: Diagnosis not present

## 2016-10-17 DIAGNOSIS — E1122 Type 2 diabetes mellitus with diabetic chronic kidney disease: Secondary | ICD-10-CM

## 2016-10-17 DIAGNOSIS — N182 Chronic kidney disease, stage 2 (mild): Secondary | ICD-10-CM

## 2016-10-17 DIAGNOSIS — I10 Essential (primary) hypertension: Secondary | ICD-10-CM | POA: Diagnosis not present

## 2016-10-17 NOTE — Progress Notes (Signed)
[place] [facility] Unk Pinto, M.D.    Uvaldo Bristle. Silverio Lay, P.A.-C      Starlyn Skeans, P.A.-C  [facility]                [address] [place], [place] [address] Telephone [phone] Telefax [phone]  Comprehensive Evaluation &  Examination     This very nice 81 y.o. MWF presents for a  comprehensive evaluation and management of multiple medical co-morbidities.  Patient has been followed for HTN, T2_NIDDM, Hyperlipidemia and Vitamin D Deficiency.     Patient also presents with a 2 week prodrome of progressive unstable gait having become broad based and shuffling.  She denies any sudden changes or other focal neurologic signs or symptoms. She denies any falls, vertigo or speech difficulties other than occasional anomia. She does relate occasional transient visual blurring , but no diplopia. She does have hx/o Cx and Lumbar DDD surg and is c/o ongoing L sciatica pains and occas neck pains. She has seen Dr Nelva Bush in the past for EDSI's      HTN predates since 1992. Patient's BP has been controlled at home and patient denies any cardiac symptoms as chest pain, palpitations, shortness of breath, dizziness or ankle swelling. Today's BP is elevated at 152/90.      Patient's hyperlipidemia is controlled with diet and medications. Patient denies myalgias or other medication SE's. Last lipids were  Lab Results  Component Value Date   CHOL 149 07/02/2016   HDL 34 (L) 07/02/2016   LDLCALC 66 07/02/2016   TRIG 244 (H) 07/02/2016   CHOLHDL 4.4 07/02/2016      Patient has T2_NIDDM (2009) with CKD 2 predating since      and patient denies reactive hypoglycemic symptoms, visual blurring, diabetic polys, or paresthesias. Last A1c was  Lab Results  Component Value Date   HGBA1C 5.8 (H) 07/02/2016      Finally, patient has history of Vitamin D Deficiency ("42" on treatment in 2009) and last Vitamin D was at goal: Lab  Results  Component Value Date   VD25OH 60 04/01/2016   Current Outpatient Prescriptions on File Prior to Visit  Medication Sig  . aspirin 81 MG tablet Take  daily.  Marland Kitchen atenolol 100 MG tablet Take 1 tab daily  . atorvastatin  80 MG tablet Take 1 tab every day  . Calcium  500 mg Take daily.  . carbamazepine  200 MG tablet Take 1 tab 2 x daily.  Marland Kitchen VITAMIN D Take 5,000 Units  2 x daily.   . cyclobenzaprine  10 MG tablet Take 1 tab 3 x daily as needed  . diclofenac  1 % GEL Apply 4 g topically 4 x daily.  Marland Kitchen lisinopril  20 MG tablet TAKE 1 TAB IN  THE EVENING   . meloxicam 15 MG tablet Take one daily with food   . metFORMIN-XR 500 MG  TAKE 1 TAB w/BKFST & LUNCH& 2  W/ SUPPER  . MILK THISTLE PO Take  2 x daily.  . Multiple Vitamin   Take 1 tab daily.  . nortriptyline  10 MG cap 1-2 at bed time for nerve pain.  . Probiotic  Take daily  . KENALOG crm Apply 1 application topically 2 (two) times daily.  . verapamil 120 MG tablet TAKE 1 TAB  TWICE A DAY  . vitamin B-12  1000  MCG tablet Take  daily.   Allergies  Allergen Reactions  . Diltiazem    Past Medical History:  Diagnosis Date  . DDD (degenerative disc disease), lumbar   . Fatty liver disease, nonalcoholic    CT AB 3532  . Hyperlipidemia   . Hypertension   . Type II or unspecified type diabetes mellitus without mention of complication, not stated as uncontrolled   . Vitamin D deficiency    Health Maintenance  Topic Date Due  . OPHTHALMOLOGY EXAM  07/19/2016  . FOOT EXAM  09/17/2016  . HEMOGLOBIN A1C  12/31/2016  . INFLUENZA VACCINE  02/12/2017  . TETANUS/TDAP  07/09/2021  . DEXA SCAN  Completed  . PNA vac Low Risk Adult  Completed   Immunization History  Administered Date(s) Administered  . H1N1 06/28/2008  . Influenza Whole 05/04/2013  . Influenza, High Dose Seasonal PF 04/26/2014, 04/26/2015, 04/01/2016  . Pneumococcal Conjugate-13 04/26/2014  . Pneumococcal Polysaccharide-23 07/10/2011  . Td 07/10/2011  . Zoster  06/28/2008   Past Surgical History:  Procedure Laterality Date  . APPENDECTOMY    . BRAIN SURGERY    . KNEE ARTHROSCOPY Left   . MASTECTOMY Bilateral    30 years ago   Family History  Problem Relation Age of Onset  . Stroke Mother   . Heart disease Mother   . Heart disease Father   . Heart attack Father    Social History  Substance Use Topics  . Smoking status: Former Smoker    Quit date: 07/15/1993  . Smokeless tobacco: Never Used  . Alcohol use 2.5 oz/week    5 Standard drinks or equivalent per week    ROS Constitutional: Denies fever, chills, weight loss/gain, headaches, insomnia,  night sweats, and change in appetite. Does c/o fatigue. Eyes: Denies redness, blurred vision, diplopia, discharge, itchy, watery eyes.  ENT: Denies discharge, congestion, post nasal drip, epistaxis, sore throat, earache, hearing loss, dental pain, Tinnitus, Vertigo, Sinus pain, snoring.  Cardio: Denies chest pain, palpitations, irregular heartbeat, syncope, dyspnea, diaphoresis, orthopnea, PND, claudication, edema Respiratory: denies cough, dyspnea, DOE, pleurisy, hoarseness, laryngitis, wheezing.  Gastrointestinal: Denies dysphagia, heartburn, reflux, water brash, pain, cramps, nausea, vomiting, bloating, diarrhea, constipation, hematemesis, melena, hematochezia, jaundice, hemorrhoids Genitourinary: Denies dysuria, frequency, urgency, nocturia, hesitancy, discharge, hematuria, flank pain Breast: Breast lumps, nipple discharge, bleeding.  Musculoskeletal: Denies arthralgia, myalgia, stiffness, Jt. Swelling, pain, limp, and strain/sprain. Denies falls. Skin: Denies puritis, rash, hives, warts, acne, eczema, changing in skin lesion Neuro: No weakness, tremor, incoordination, spasms, paresthesia, pain Psychiatric: Denies confusion, memory loss, sensory loss. Denies Depression. Endocrine: Denies change in weight, skin, hair change, nocturia, and paresthesia, diabetic polys, visual blurring, hyper / hypo  glycemic episodes.  Heme/Lymph: No excessive bleeding, bruising, enlarged lymph nodes.  Physical Exam  BP (!) 152/90   Pulse 72   Temp 97.8 F (36.6 C)   Resp 16   Ht 5\' 3"  (1.6 m)   Wt 165 lb (74.8 kg)   BMI 29.23 kg/m   General Appearance: Well nourished, well groomed and in no apparent distress.  Eyes: PERRLA, EOMs, conjunctiva no swelling or erythema, normal fundi and vessels. Sinuses: No frontal/maxillary tenderness ENT/Mouth: EACs patent / TMs  nl. Nares clear without erythema, swelling, mucoid exudates. Oral hygiene is good. No erythema, swelling, or exudate. Tongue normal, non-obstructing. Tonsils not swollen or erythematous. Hearing normal.  Neck: Supple, thyroid normal. No bruits, nodes or JVD. Respiratory: Respiratory effort normal.  BS equal and clear bilateral without rales, rhonci, wheezing or  stridor. Cardio: Heart sounds are normal with regular rate and rhythm and no murmurs, rubs or gallops. Peripheral pulses are normal and equal bilaterally without edema. No aortic or femoral bruits. Chest: symmetric with normal excursions and percussion. Breasts: Symmetric, without lumps, nipple discharge, retractions, or fibrocystic changes.  Abdomen: Flat, soft with bowel sounds active. Nontender, no guarding, rebound, hernias, masses, or organomegaly.  Lymphatics: Non tender without lymphadenopathy.  Genitourinary:  Musculoskeletal: Full ROM all peripheral extremities, joint stability, 5/5 strength and broad based sl unstable shuffling gait. Neg Rhomberg and Gower's and no frontal lobe signs.  Skin: Warm and dry without rashes, lesions, cyanosis, clubbing or  ecchymosis.  Neuro: Cranial nerves intact, reflexes equal bilaterally. Normal muscle tone, no cerebellar symptoms. Sensation intact. Speech fluent.  Pysch: Alert and oriented X 3, normal affect, Insight and Judgment appropriate.   Assessment and Plan  1. Essential hypertension  - EKG 12-Lead - Urinalysis, Routine w  reflex microscopic - Microalbumin / creatinine urine ratio - CBC with Differential/Platelet - BASIC METABOLIC PANEL WITH GFR - Magnesium - TSH  2. Mixed hyperlipidemia  - Hepatic function panel - Lipid panel - TSH  3. Type 2 diabetes mellitus with stage 2 chronic kidney disease, without long-term current use of insulin (HCC)  - EKG 12-Lead - Microalbumin / creatinine urine ratio - HM DIABETES FOOT EXAM - LOW EXTREMITY NEUR EXAM DOCUM - Hemoglobin A1c - Insulin, random  4. Vitamin D deficiency  - VITAMIN D 25 Hydroxy  5. Screening for rectal cancer  - POC Hemoccult Bld/Stl   6. Screening for ischemic heart disease  - EKG 12-Lead  7. Medication management  - Urinalysis, Routine w reflex microscopic - CBC with Differential/Platelet - BASIC METABOLIC PANEL WITH GFR - Hepatic function panel - Magnesium - Lipid panel - TSH - Hemoglobin A1c - Insulin, random - VITAMIN D 25 Hydroxy   8. Abnormal gait  - MR Brain W Wo Contrast; Future       Patient was counseled in prudent diet to achieve/maintain BMI less than 25 for weight control, BP monitoring, regular exercise and medications. Discussed med's effects and SE's. Screening labs and tests as requested with regular follow-up as recommended. Over 40 minutes of exam, counseling, chart review and high complex critical decision making was performed.

## 2016-10-17 NOTE — Patient Instructions (Signed)

## 2016-10-18 LAB — CBC WITH DIFFERENTIAL/PLATELET
BASOS PCT: 0 %
Basophils Absolute: 0 cells/uL (ref 0–200)
EOS PCT: 3 %
Eosinophils Absolute: 171 cells/uL (ref 15–500)
HCT: 41.1 % (ref 35.0–45.0)
Hemoglobin: 13.6 g/dL (ref 11.7–15.5)
Lymphocytes Relative: 26 %
Lymphs Abs: 1482 cells/uL (ref 850–3900)
MCH: 30.8 pg (ref 27.0–33.0)
MCHC: 33.1 g/dL (ref 32.0–36.0)
MCV: 93.2 fL (ref 80.0–100.0)
MPV: 10.1 fL (ref 7.5–12.5)
Monocytes Absolute: 969 cells/uL — ABNORMAL HIGH (ref 200–950)
Monocytes Relative: 17 %
Neutro Abs: 3078 cells/uL (ref 1500–7800)
Neutrophils Relative %: 54 %
PLATELETS: 182 10*3/uL (ref 140–400)
RBC: 4.41 MIL/uL (ref 3.80–5.10)
RDW: 14.5 % (ref 11.0–15.0)
WBC: 5.7 10*3/uL (ref 3.8–10.8)

## 2016-10-18 LAB — BASIC METABOLIC PANEL WITH GFR
BUN: 10 mg/dL (ref 7–25)
CHLORIDE: 100 mmol/L (ref 98–110)
CO2: 28 mmol/L (ref 20–31)
Calcium: 9.1 mg/dL (ref 8.6–10.4)
Creat: 0.59 mg/dL — ABNORMAL LOW (ref 0.60–0.93)
GFR, EST NON AFRICAN AMERICAN: 88 mL/min (ref >=60)
GFR, Est African American: >89 mL/min (ref >=60)
Glucose, Bld: 90 mg/dL (ref 65–99)
POTASSIUM: 3.9 mmol/L (ref 3.5–5.3)
SODIUM: 136 mmol/L (ref 135–146)

## 2016-10-18 LAB — MICROALBUMIN / CREATININE URINE RATIO
Creatinine, Urine: 30 mg/dL (ref 20–320)
MICROALB UR: 0.7 mg/dL
MICROALB/CREAT RATIO: 23 ug/mg{creat} (ref <30)

## 2016-10-18 LAB — LIPID PANEL
CHOL/HDL RATIO: 4.8 ratio (ref <5.0)
Cholesterol: 176 mg/dL (ref <200)
HDL: 37 mg/dL — AB (ref >50)
LDL CALC: 92 mg/dL (ref <100)
Triglycerides: 237 mg/dL — ABNORMAL HIGH (ref <150)
VLDL: 47 mg/dL — AB (ref <30)

## 2016-10-18 LAB — HEPATIC FUNCTION PANEL
ALK PHOS: 48 U/L (ref 33–130)
ALT: 19 U/L (ref 6–29)
AST: 19 U/L (ref 10–35)
Albumin: 4.5 g/dL (ref 3.6–5.1)
BILIRUBIN DIRECT: 0.1 mg/dL (ref <=0.2)
BILIRUBIN TOTAL: 0.3 mg/dL (ref 0.2–1.2)
Indirect Bilirubin: 0.2 mg/dL (ref 0.2–1.2)
Total Protein: 6.5 g/dL (ref 6.1–8.1)

## 2016-10-18 LAB — HEMOGLOBIN A1C
HEMOGLOBIN A1C: 5.9 % — AB (ref <5.7)
Mean Plasma Glucose: 123 mg/dL

## 2016-10-18 LAB — INSULIN, RANDOM: Insulin: 21.3 u[IU]/mL — ABNORMAL HIGH (ref 2.0–19.6)

## 2016-10-18 LAB — URINALYSIS, ROUTINE W REFLEX MICROSCOPIC
Bilirubin Urine: NEGATIVE
GLUCOSE, UA: NEGATIVE
Hgb urine dipstick: NEGATIVE
KETONES UR: NEGATIVE
LEUKOCYTES UA: NEGATIVE
NITRITE: NEGATIVE
PH: 6.5 (ref 5.0–8.0)
Protein, ur: NEGATIVE
SPECIFIC GRAVITY, URINE: 1.006 (ref 1.001–1.035)

## 2016-10-18 LAB — MAGNESIUM: MAGNESIUM: 1.9 mg/dL (ref 1.5–2.5)

## 2016-10-18 LAB — VITAMIN D 25 HYDROXY (VIT D DEFICIENCY, FRACTURES): Vit D, 25-Hydroxy: 39 ng/mL (ref 30–100)

## 2016-10-18 LAB — TSH: TSH: 4.05 m[IU]/L

## 2016-10-19 ENCOUNTER — Ambulatory Visit (HOSPITAL_COMMUNITY)
Admission: RE | Admit: 2016-10-19 | Discharge: 2016-10-19 | Disposition: A | Discharge status: Home / Self Care | Payer: Medicare Other | Source: Ambulatory Visit | Attending: Internal Medicine | Admitting: Internal Medicine

## 2016-10-19 ENCOUNTER — Other Ambulatory Visit: Payer: Self-pay | Admitting: Internal Medicine

## 2016-10-19 ENCOUNTER — Ambulatory Visit (HOSPITAL_COMMUNITY): Payer: Medicare Other

## 2016-10-19 DIAGNOSIS — R269 Unspecified abnormalities of gait and mobility: Secondary | ICD-10-CM | POA: Diagnosis present

## 2016-10-19 MED ORDER — GADOBENATE DIMEGLUMINE 529 MG/ML IV SOLN
15.0000 mL | Freq: Once | INTRAVENOUS | Status: AC | PRN
  Administered 2016-10-19: 15 mL via INTRAVENOUS

## 2016-10-22 ENCOUNTER — Encounter: Payer: Self-pay | Admitting: Internal Medicine

## 2016-10-22 ENCOUNTER — Other Ambulatory Visit: Payer: Self-pay | Admitting: Internal Medicine

## 2016-10-22 ENCOUNTER — Other Ambulatory Visit: Payer: Medicare Other

## 2016-10-22 ENCOUNTER — Encounter: Payer: Self-pay | Admitting: *Deleted

## 2016-10-22 NOTE — Progress Notes (Signed)
Debra Collins was seen today in the movement disorders clinic for neurologic consultation at the request of MCKEOWN,WILLIAM DAVID, MD.  .This patient is accompanied in the office by her spouse who supplements the history.  The consultation is for the evaluation of gait change.  The records that were made available to me were reviewed.  Pt noting balance issues starting within the past month.   Pt states that she feels that she is going to fall over forward.  She states that it seemed to come on acutely.  She thought that she had a stroke but MRI didn't show that.   Was started on tegretol in mid December for presumed trigeminal neuralgia after patient c/o L facial pain.  She denies today that she has ever had L facial pain and states that she has had L neck pain.  States that she has had a neck surgery.  She does complain that her left foot will just "shake on the ground."   Specific Symptoms:  Tremor: No. - "not heavy tremor." Family hx of similar:  Mom had loss of balance after a stroke Voice: no change Sleep: sleeps fairly well  Vivid Dreams:  No.  Acting out dreams:  No. Wet Pillows: No. Postural symptoms:  Yes.    Falls?  Yes.   (just from tripping over objects) Bradykinesia symptoms: difficulty getting out of a chair Loss of smell:  Yes.   Loss of taste:  Yes.   Urinary Incontinence:  No. Difficulty Swallowing:  No. Handwriting, micrographia: Yes.   Trouble with ADL's:  No.  Trouble buttoning clothing: No. Depression:  Yes.  , some due to recent death of a friend Memory changes:  Yes.   (trouble with names or words) Hallucinations:  No.  visual distortions: Yes.   N/V:  No. Lightheaded:  Yes.    Syncope: No. Diplopia:  Yes.   (binocular, vertical, only lasts seconds) Dyskinesia:  No.   MRI of the brain was performed on 10/19/2016.  I reviewed these images.  There was moderate small vessel disease.  ALLERGIES:   Allergies  Allergen Reactions  . Diltiazem     CURRENT  MEDICATIONS:  Outpatient Encounter Prescriptions as of 10/24/2016  Medication Sig  . aspirin 81 MG tablet Take 81 mg by mouth daily.  Marland Kitchen atenolol (TENORMIN) 100 MG tablet Take 1 tablet (100 mg total) by mouth daily. for blood pressure  . atorvastatin (LIPITOR) 80 MG tablet Take 1 tablet by mouth  every day for cholesterol  . Calcium Citrate (CALCITRATE PO) Take 500 mg by mouth daily.  . carbamazepine (TEGRETOL) 200 MG tablet Take 1 tablet (200 mg total) by mouth 2 (two) times daily.  . Cholecalciferol (VITAMIN D PO) Take 5,000 Units by mouth 2 (two) times daily.   . cyclobenzaprine (FLEXERIL) 10 MG tablet Take 1 tablet (10 mg total) by mouth 3 (three) times daily as needed for muscle spasms.  . diclofenac sodium (VOLTAREN) 1 % GEL Apply 4 g topically 4 (four) times daily.  Marland Kitchen lisinopril (PRINIVIL,ZESTRIL) 20 MG tablet TAKE 1 TABLET BY MOUTH IN  THE EVENING FOR BLOOD  PRESSURE  . metFORMIN (GLUCOPHAGE-XR) 500 MG 24 hr tablet TAKE 1 TABLET BY MOUTH WITH BREAKFAST AND LUNCH AND 2  WITH SUPPER FOR DIABETES  . MILK THISTLE PO Take by mouth 2 (two) times daily.  . Multiple Vitamin (MULTIVITAMIN) tablet Take 1 tablet by mouth daily.  . nortriptyline (PAMELOR) 10 MG capsule 1-2 at bed time for  nerve pain.  . Probiotic Product (PROBIOTIC DAILY PO) Take by mouth.  . verapamil (CALAN) 120 MG tablet TAKE 1 TABLET BY MOUTH  TWICE A DAY  . vitamin B-12 (CYANOCOBALAMIN) 1000 MCG tablet Take 1,000 mcg by mouth daily.  . [DISCONTINUED] glucose blood test strip Check glucose daily  . [DISCONTINUED] meloxicam (MOBIC) 15 MG tablet Take one daily with food for 2 weeks, can take with tylenol, can not take with aleve, iburpofen, then as needed daily for pain  . [DISCONTINUED] OVER THE COUNTER MEDICATION   . [DISCONTINUED] triamcinolone cream (KENALOG) 0.5 % Apply 1 application topically 2 (two) times daily.   No facility-administered encounter medications on file as of 10/24/2016.     PAST MEDICAL HISTORY:   Past  Medical History:  Diagnosis Date  . DDD (degenerative disc disease), lumbar   . Fatty liver disease, nonalcoholic    CT AB 9983  . Hyperlipidemia   . Hypertension   . Type II or unspecified type diabetes mellitus without mention of complication, not stated as uncontrolled   . Vitamin D deficiency     PAST SURGICAL HISTORY:   Past Surgical History:  Procedure Laterality Date  . APPENDECTOMY    . KNEE ARTHROSCOPY Left   . MASTECTOMY Bilateral    30 years ago    SOCIAL HISTORY:   Social History   Social History  . Marital status: Married    Spouse name: N/A  . Number of children: N/A  . Years of education: N/A   Occupational History  . retired     Insurance underwriter   Social History Main Topics  . Smoking status: Former Smoker    Quit date: 07/15/1993  . Smokeless tobacco: Never Used  . Alcohol use No  . Drug use: No  . Sexual activity: Not on file   Other Topics Concern  . Not on file   Social History Narrative  . No narrative on file    FAMILY HISTORY:   Family Status  Relation Status  . Mother Deceased  . Father Deceased  . Sister Alive  . Brother Alive  . Child Alive    ROS:  A complete 10 system review of systems was obtained and was unremarkable apart from what is mentioned above.  PHYSICAL EXAMINATION:    VITALS:   Vitals:   10/24/16 1318  BP: (!) 156/80  Pulse: 74  SpO2: 93%  Weight: 162 lb (73.5 kg)  Height: 5\' 3"  (1.6 m)    GEN:  The patient appears stated age and is in NAD. HEENT:  Normocephalic, atraumatic.  The mucous membranes are moist. The superficial temporal arteries are without ropiness or tenderness. CV:  RRR Lungs:  CTAB Neck/HEME:  There are no carotid bruits bilaterally.  Neurological examination:  Orientation: The patient is alert and oriented x3. Fund of knowledge is appropriate.  Recent and remote memory are intact.  Attention and concentration are normal.    Able to name objects and repeat phrases. Cranial nerves: There is  good facial symmetry. Pupils are equal round and reactive to light bilaterally. Fundoscopic exam reveals clear margins bilaterally. Extraocular muscles are intact. The visual fields are full to confrontational testing. The speech is fluent and clear. Soft palate rises symmetrically and there is no tongue deviation. Hearing is intact to conversational tone. Sensation: Sensation is intact to light and pinprick throughout (facial, trunk, extremities). Vibration is absent at the bilateral big toe and decreased in a distal fashion. There is no extinction with double  simultaneous stimulation. There is no sensory dermatomal level identified. Motor: Strength is 5/5 in the bilateral upper and lower extremities.   Shoulder shrug is equal and symmetric.  There is no pronator drift. Deep tendon reflexes: Deep tendon reflexes are 2+/4 at the bilateral biceps, triceps, brachioradialis, patella and achilles. Plantar responses are downgoing bilaterally.  Movement examination: Tone: There is normal tone in the bilateral upper extremities.  The tone in the lower extremities is normal.  Abnormal movements: none Coordination:  There is no decremation with RAM's, with any form of RAMS, including alternating supination and pronation of the forearm, hand opening and closing, finger taps, heel taps and toe taps. Gait and Station: The patient has no difficulty arising out of a deep-seated chair without the use of the hands. The patient's stride length is wide based.  She is slightly forward leaning.  She is more unsteady the longer that she walks.      LABS:    Chemistry      Component Value Date/Time   NA 136 10/17/2016 1138   K 3.9 10/17/2016 1138   CL 100 10/17/2016 1138   CO2 28 10/17/2016 1138   BUN 10 10/17/2016 1138   CREATININE 0.59 (L) 10/17/2016 1138      Component Value Date/Time   CALCIUM 9.1 10/17/2016 1138   ALKPHOS 48 10/17/2016 1138   AST 19 10/17/2016 1138   ALT 19 10/17/2016 1138   BILITOT 0.3  10/17/2016 1138     Lab Results  Component Value Date   TSH 4.05 10/17/2016   Lab Results  Component Value Date   HGBA1C 5.9 (H) 10/17/2016   Lab Results  Component Value Date   WBC 5.7 10/17/2016   HGB 13.6 10/17/2016   HCT 41.1 10/17/2016   MCV 93.2 10/17/2016   PLT 182 10/17/2016   No results found for: VITAMINB12   ASSESSMENT/PLAN:  1.  Gait instability  -suspect multifactorial  -will d/c tegretol since was newest med started.  Notes indicate started for facial pain but patient denies ever having facial pain and states that she only had L neck pain.  Tegretol can contribute to gait change  -very hyperreflexic with neck pain.  Has had neck surgery in the past.  Will do MRI cervical spine to make sure no myelopathic issue  -she does complain of what sounds like clonus in the L foot.  I was unable to reproduce today.  If don't find anything else, I will do an MRI of the lumbar spine.    2.  f/u based on findings above.  Much greater than 50% of this visit was spent in counseling and coordinating care.  Total face to face time:  60 min   Cc:  Alesia Richards, MD

## 2016-10-23 ENCOUNTER — Other Ambulatory Visit: Payer: Self-pay | Admitting: Internal Medicine

## 2016-10-24 ENCOUNTER — Ambulatory Visit (INDEPENDENT_AMBULATORY_CARE_PROVIDER_SITE_OTHER): Payer: Medicare Other | Admitting: Neurology

## 2016-10-24 ENCOUNTER — Encounter: Payer: Self-pay | Admitting: Neurology

## 2016-10-24 VITALS — BP 156/80 | HR 74 | Ht 63.0 in | Wt 162.0 lb

## 2016-10-24 DIAGNOSIS — Z981 Arthrodesis status: Secondary | ICD-10-CM | POA: Diagnosis not present

## 2016-10-24 DIAGNOSIS — M542 Cervicalgia: Secondary | ICD-10-CM | POA: Diagnosis not present

## 2016-10-24 DIAGNOSIS — R269 Unspecified abnormalities of gait and mobility: Secondary | ICD-10-CM

## 2016-10-24 NOTE — Patient Instructions (Addendum)
1. Stop the Carbamazepine.   2. We have sent a referral to [facility] for your MRI and they will call you directly to schedule your appt. They are located at [place]. If you need to contact them directly please call [phone].

## 2016-10-29 ENCOUNTER — Other Ambulatory Visit: Payer: Self-pay | Admitting: Internal Medicine

## 2016-10-29 ENCOUNTER — Telehealth: Payer: Self-pay | Admitting: Neurology

## 2016-10-29 DIAGNOSIS — Z79899 Other long term (current) drug therapy: Secondary | ICD-10-CM

## 2016-10-29 DIAGNOSIS — N390 Urinary tract infection, site not specified: Secondary | ICD-10-CM

## 2016-10-29 NOTE — Telephone Encounter (Signed)
Caller: Patient   Urgent? Yes  Reason for the call: She was seen by Dr. Carles Collet on 10/24/16. She was asked by Dr. Carles Collet did she smell fish. At the time she didn't. Now she is having strange sensations and taste. She has had an MRI with dye. She wants to make sure it is safe to have another. Please call. Thanks

## 2016-10-29 NOTE — Telephone Encounter (Signed)
Patient is diabetic. She is in [facility] but I advised her to see urgent care today for evaluation.

## 2016-10-29 NOTE — Telephone Encounter (Signed)
Agree.  Things like UTI, ketone induced states, etc can be serious and present this way.  When I asked her if she smelled fish it was because I was testing her smell and there was in fact a fish smell in the office.  Please have her go to PCP today and not wait.

## 2016-10-29 NOTE — Telephone Encounter (Signed)
Patient made aware she should proceed with MR cervical as planned.  She states that she has now having a strange taste in her mouth and a strange smell. She states she can not describe it. She states her husband also smelled it on her and can't describe it. She called her PCP and was told to call us. I made her aware that a strange smell that isn't just her (that her husband can also smell) would not be neurologic related and she should follow back up with PCP. She has not started any new meds.

## 2016-10-31 ENCOUNTER — Other Ambulatory Visit: Payer: Self-pay | Admitting: Internal Medicine

## 2016-10-31 ENCOUNTER — Other Ambulatory Visit: Payer: Medicare Other

## 2016-10-31 DIAGNOSIS — N390 Urinary tract infection, site not specified: Secondary | ICD-10-CM | POA: Diagnosis not present

## 2016-10-31 DIAGNOSIS — Z79899 Other long term (current) drug therapy: Secondary | ICD-10-CM | POA: Diagnosis not present

## 2016-10-31 LAB — URINALYSIS, ROUTINE W REFLEX MICROSCOPIC
BILIRUBIN URINE: NEGATIVE
GLUCOSE, UA: NEGATIVE
HGB URINE DIPSTICK: NEGATIVE
Ketones, ur: NEGATIVE
NITRITE: NEGATIVE
PROTEIN: NEGATIVE
Specific Gravity, Urine: 1.016 (ref 1.001–1.035)
pH: 5.5 (ref 5.0–8.0)

## 2016-10-31 LAB — CBC WITH DIFFERENTIAL/PLATELET
BASOS ABS: 0 {cells}/uL (ref 0–200)
Basophils Relative: 0 %
EOS PCT: 4 %
Eosinophils Absolute: 244 cells/uL (ref 15–500)
HCT: 43.3 % (ref 35.0–45.0)
Hemoglobin: 14.6 g/dL (ref 11.7–15.5)
Lymphocytes Relative: 26 %
Lymphs Abs: 1586 cells/uL (ref 850–3900)
MCH: 31.6 pg (ref 27.0–33.0)
MCHC: 33.7 g/dL (ref 32.0–36.0)
MCV: 93.7 fL (ref 80.0–100.0)
MONOS PCT: 12 %
MPV: 10 fL (ref 7.5–12.5)
Monocytes Absolute: 732 cells/uL (ref 200–950)
NEUTROS ABS: 3538 {cells}/uL (ref 1500–7800)
NEUTROS PCT: 58 %
PLATELETS: 205 10*3/uL (ref 140–400)
RBC: 4.62 MIL/uL (ref 3.80–5.10)
RDW: 14.6 % (ref 11.0–15.0)
WBC: 6.1 10*3/uL (ref 3.8–10.8)

## 2016-10-31 LAB — URINALYSIS, MICROSCOPIC ONLY
Bacteria, UA: NONE SEEN [HPF]
Casts: NONE SEEN [LPF]
Crystals: NONE SEEN [HPF]
RBC / HPF: NONE SEEN RBC/HPF (ref <=2)
WBC UA: NONE SEEN WBC/HPF (ref <=5)
YEAST: NONE SEEN [HPF]

## 2016-10-31 LAB — BASIC METABOLIC PANEL WITH GFR
BUN: 11 mg/dL (ref 7–25)
CALCIUM: 9.5 mg/dL (ref 8.6–10.4)
CO2: 28 mmol/L (ref 20–31)
CREATININE: 0.63 mg/dL (ref 0.60–0.93)
Chloride: 104 mmol/L (ref 98–110)
GFR, Est African American: >89 mL/min (ref >=60)
GFR, Est Non African American: 86 mL/min (ref >=60)
Glucose, Bld: 107 mg/dL — ABNORMAL HIGH (ref 65–99)
Potassium: 4.1 mmol/L (ref 3.5–5.3)
SODIUM: 141 mmol/L (ref 135–146)

## 2016-11-01 ENCOUNTER — Other Ambulatory Visit: Payer: Self-pay

## 2016-11-06 ENCOUNTER — Ambulatory Visit
Admission: RE | Admit: 2016-11-06 | Discharge: 2016-11-06 | Disposition: A | Discharge status: Home / Self Care | Payer: Medicare Other | Source: Ambulatory Visit | Attending: Neurology | Admitting: Neurology

## 2016-11-06 ENCOUNTER — Telehealth: Payer: Self-pay | Admitting: Neurology

## 2016-11-06 DIAGNOSIS — R269 Unspecified abnormalities of gait and mobility: Secondary | ICD-10-CM

## 2016-11-06 DIAGNOSIS — M542 Cervicalgia: Secondary | ICD-10-CM

## 2016-11-06 DIAGNOSIS — D1800 Hemangioma unspecified site: Secondary | ICD-10-CM

## 2016-11-06 DIAGNOSIS — Z981 Arthrodesis status: Secondary | ICD-10-CM

## 2016-11-06 DIAGNOSIS — M4802 Spinal stenosis, cervical region: Secondary | ICD-10-CM | POA: Diagnosis not present

## 2016-11-06 DIAGNOSIS — R9389 Abnormal findings on diagnostic imaging of other specified body structures: Secondary | ICD-10-CM

## 2016-11-06 MED ORDER — GADOBENATE DIMEGLUMINE 529 MG/ML IV SOLN
15.0000 mL | Freq: Once | INTRAVENOUS | Status: AC | PRN
  Administered 2016-11-06: 15 mL via INTRAVENOUS

## 2016-11-06 NOTE — Telephone Encounter (Signed)
Left message on machine for patient to call back.

## 2016-11-06 NOTE — Telephone Encounter (Signed)
With.  Dx: hemangioma, abnormal MRI cervical spine

## 2016-11-06 NOTE — Telephone Encounter (Signed)
-----   Message from [place], DO sent at 11/06/2016  1:29 PM EDT ----- Ask patient if she has had any other imaging of neck in recent years (know had surgery 20 years ago).  If not, order CT cervical spine.  Tell her that there is something that they want to get better look at

## 2016-11-06 NOTE — Telephone Encounter (Signed)
Patient has not had any recent imaging of neck. She is okay with getting a CT scan.  Should this be with, w/wo,  or without?

## 2016-11-07 NOTE — Telephone Encounter (Signed)
Order entered. [facility] will call patient to schedule.

## 2016-11-11 ENCOUNTER — Ambulatory Visit
Admission: RE | Admit: 2016-11-11 | Discharge: 2016-11-11 | Disposition: A | Discharge status: Home / Self Care | Payer: Medicare Other | Source: Ambulatory Visit | Attending: Neurology | Admitting: Neurology

## 2016-11-11 DIAGNOSIS — R9389 Abnormal findings on diagnostic imaging of other specified body structures: Secondary | ICD-10-CM

## 2016-11-11 DIAGNOSIS — M4314 Spondylolisthesis, thoracic region: Secondary | ICD-10-CM | POA: Diagnosis not present

## 2016-11-11 DIAGNOSIS — D1800 Hemangioma unspecified site: Secondary | ICD-10-CM

## 2016-11-12 ENCOUNTER — Telehealth: Payer: Self-pay | Admitting: Neurology

## 2016-11-12 NOTE — Telephone Encounter (Signed)
-----   Message from [place], DO sent at 11/11/2016  4:52 PM EDT ----- You can let pt know that it was just a benign vertebral hemangioma that we saw and nothing to worry about.

## 2016-11-12 NOTE — Telephone Encounter (Signed)
Patient made aware of results.  

## 2016-11-14 ENCOUNTER — Other Ambulatory Visit: Payer: Self-pay | Admitting: Internal Medicine

## 2016-11-14 MED ORDER — AZITHROMYCIN 250 MG PO TABS: ORAL_TABLET | ORAL | 0 refills | Status: DC

## 2016-11-19 ENCOUNTER — Other Ambulatory Visit: Payer: Self-pay | Admitting: *Deleted

## 2016-11-19 DIAGNOSIS — Z1212 Encounter for screening for malignant neoplasm of rectum: Secondary | ICD-10-CM

## 2016-11-19 LAB — POC HEMOCCULT BLD/STL (HOME/3-CARD/SCREEN)
Card #2 Fecal Occult Blod, POC: NEGATIVE
FECAL OCCULT BLD: NEGATIVE
FECAL OCCULT BLD: NEGATIVE

## 2017-01-29 DIAGNOSIS — J449 Chronic obstructive pulmonary disease, unspecified: Secondary | ICD-10-CM | POA: Insufficient documentation

## 2017-01-29 NOTE — Progress Notes (Signed)
MEDICARE ANNUAL WELLNESS VISIT AND 3  MONTH FOLLOW UP  Assessment:    Essential hypertension - continue medications, DASH diet, exercise and monitor at home. Call if greater than 130/80.  -     CBC with Differential/Platelet -     BASIC METABOLIC PANEL WITH GFR -     Hepatic function panel -     TSH  DM with CKD Discussed general issues about diabetes pathophysiology and management., Educational material distributed., Suggested low cholesterol diet., Encouraged aerobic exercise., Discussed foot care., Reminded to get yearly retinal exam. -     Hemoglobin A1c  Mixed hyperlipidemia -continue medications, check lipids, decrease fatty foods, increase activity.  -     Lipid panel  Vitamin D deficiency Continue supplement  Medication management -     Magnesium  Medicare annual wellness visit, subsequent Due next year UTD  Chronic obstructive pulmonary disease, unspecified COPD type ([place]) Avoid triggers, no symptoms.   Neck pain -     cyclobenzaprine (FLEXERIL) 10 MG tablet; Take 1 tablet (10 mg total) by mouth at bedtime as needed for muscle spasms.  Over 40 minutes of exam, counseling, chart review and critical decision making was performed Future Appointments Date Time Provider [place]  05/07/2017 9:30 AM Unk Pinto, MD [facility] None     Plan:   During the course of the visit the patient was educated and counseled about appropriate screening and preventive services including:    Pneumococcal vaccine   Prevnar 13  Influenza vaccine  Td vaccine  Screening electrocardiogram  Bone densitometry screening  Colorectal cancer screening  Diabetes screening  Glaucoma screening  Nutrition counseling   Advanced directives: requested   Subjective:  Debra Collins is a 81 y.o. female who presents for Medicare Annual Wellness Visit and 3 month follow up.  She has been having some eye issues, feels like "small stone" on inner corner of either  eye, having watery eyes, has appointment with Dr. Gershon Crane on Monday.  Had ear fullness/congestion, this has gotten better but still with right ear pain occ, some neck/shoulder pain but better, thinks this is from [place] trip and carrying luggage but it is improving.  Her blood pressure has been controlled at home, today their BP is BP: 110/74  She does not workout. She denies chest pain, shortness of breath, dizziness.  She is on cholesterol medication and denies myalgias. Her cholesterol is at goal. The cholesterol last visit was:   Lab Results  Component Value Date   CHOL 176 10/17/2016   HDL 37 (L) 10/17/2016   LDLCALC 92 10/17/2016   TRIG 237 (H) 10/17/2016   CHOLHDL 4.8 10/17/2016    She has been working on diet and exercise for diabetes with CKD, she is on bASA, she is on ACE, and denies paresthesia of the feet, polydipsia and polyuria. Last A1C in the office was:  Lab Results  Component Value Date   HGBA1C 5.9 (H) 10/17/2016   Lab Results  Component Value Date   GFRNONAA 86 10/31/2016   Patient is on Vitamin D supplement.   Lab Results  Component Value Date   VD25OH 39 10/17/2016     BMI is Body mass index is 28.06 kg/m., she is working on diet and exercise. Wt Readings from Last 3 Encounters:  01/31/17 158 lb 6.4 oz (71.8 kg)  10/24/16 162 lb (73.5 kg)  10/19/16 160 lb (72.6 kg)     Medication Review: Current Outpatient Prescriptions on File Prior to Visit  Medication Sig Dispense Refill  . aspirin 81 MG tablet Take 81 mg by mouth daily.    Marland Kitchen atenolol (TENORMIN) 100 MG tablet Take 1 tablet (100 mg total) by mouth daily. for blood pressure 90 tablet 1  . atorvastatin (LIPITOR) 80 MG tablet Take 1 tablet by mouth  every day for cholesterol 90 tablet 1  . Calcium Citrate (CALCITRATE PO) Take 500 mg by mouth daily.    . Cholecalciferol (VITAMIN D PO) Take 5,000 Units by mouth 2 (two) times daily.     Marland Kitchen lisinopril (PRINIVIL,ZESTRIL) 20 MG tablet TAKE 1 TABLET BY MOUTH  IN  THE EVENING FOR BLOOD  PRESSURE 90 tablet 1  . metFORMIN (GLUCOPHAGE-XR) 500 MG 24 hr tablet TAKE 1 TABLET BY MOUTH WITH BREAKFAST AND LUNCH AND 2  WITH SUPPER FOR DIABETES 360 tablet 1  . MILK THISTLE PO Take by mouth 2 (two) times daily.    . Multiple Vitamin (MULTIVITAMIN) tablet Take 1 tablet by mouth daily.    . nortriptyline (PAMELOR) 10 MG capsule 1-2 at bed time for nerve pain. 60 capsule 0  . Probiotic Product (PROBIOTIC DAILY PO) Take by mouth.    . verapamil (CALAN) 120 MG tablet TAKE 1 TABLET BY MOUTH  TWICE A DAY 180 tablet 1  . vitamin B-12 (CYANOCOBALAMIN) 1000 MCG tablet Take 1,000 mcg by mouth daily.     No current facility-administered medications on file prior to visit.     Current Problems (verified) Patient Active Problem List   Diagnosis Date Noted  . Chronic obstructive pulmonary disease ([place]) 01/29/2017  . Medicare annual wellness visit, subsequent 03/09/2015  . T2_NIDDM w/CKD 08/16/2014  . Medication management 01/23/2014  . Essential hypertension 07/25/2013  . Mixed hyperlipidemia 07/25/2013  . Vitamin D deficiency 07/25/2013    Screening Tests Immunization History  Administered Date(s) Administered  . H1N1 06/28/2008  . Influenza Whole 05/04/2013  . Influenza, High Dose Seasonal PF 04/26/2014, 04/26/2015, 04/01/2016  . Pneumococcal Conjugate-13 04/26/2014  . Pneumococcal Polysaccharide-23 07/10/2011  . Td 07/10/2011  . Zoster 06/28/2008   Preventative care: Last colonoscopy: 2005 declines another Last mammogram: bilateral mastectomy DEXA: 01/2016 US transvaginal 08/2016  Prior vaccinations: TD or Tdap: 2012  Influenza: 2017 Pneumococcal: 2012 Prevnar13: 2015 Shingles/Zostavax: 2009  Names of Other Physician/Practitioners you currently use: 1. [facility] here for primary care 2. Dr. Gershon Crane, eye doctor, last visit Jan/2017 3., Dr. Lavone Neri dentist, last visit 07/2016, will have another  appointment Patient Care Team: Unk Pinto, MD as PCP - General (Internal Medicine) Melissa Noon, OD (Optometry)  Allergies Allergies  Allergen Reactions  . Diltiazem     SURGICAL HISTORY She  has a past surgical history that includes Knee arthroscopy (Left); Appendectomy; and Mastectomy (Bilateral). FAMILY HISTORY Her family history includes Heart attack in her father and sister; Heart disease in her father and mother; Stroke in her mother. SOCIAL HISTORY She  reports that she quit smoking about 23 years ago. She has never used smokeless tobacco. She reports that she does not drink alcohol or use drugs.  MEDICARE WELLNESS OBJECTIVES: Physical activity: Current Exercise Habits: The patient does not participate in regular exercise at present Cardiac risk factors: Cardiac Risk Factors include: advanced age (>53men, >13 women);dyslipidemia;hypertension;sedentary lifestyle;family history of premature cardiovascular disease Depression/mood screen:   Depression screen [facility] 2/9 01/31/2017  Decreased Interest 0  Down, Depressed, Hopeless 0  PHQ - 2 Score 0    ADLs:  In your present state of health, do  you have any difficulty performing the following activities: 01/31/2017 10/17/2016  Hearing? N N  Vision? N N  Difficulty concentrating or making decisions? N N  Walking or climbing stairs? N Y  Dressing or bathing? N N  Doing errands, shopping? N N  Some recent data might be hidden     Cognitive Testing  Alert? Yes  Normal Appearance?Yes  Oriented to person? Yes  Place? Yes   Time? Yes  Recall of three objects?  Yes  Can perform simple calculations? Yes  Displays appropriate judgment?Yes  Can read the correct time from a watch face?Yes  EOL planning: Does Patient Have a Medical Advance Directive?: Yes Type of Advance Directive: Healthcare Power of Attorney, Living will [place] in Chart?: No - copy requested  Review of Systems  Constitutional:  Negative for chills, fever and malaise/fatigue.  HENT: Negative for congestion, ear pain and sore throat.   Eyes: Negative.   Respiratory: Negative for cough, shortness of breath and wheezing.   Cardiovascular: Negative for chest pain, palpitations and leg swelling.  Gastrointestinal: Negative for abdominal pain, blood in stool, constipation, diarrhea, heartburn and melena.  Genitourinary: Negative.   Skin: Negative.   Neurological: Negative for dizziness, sensory change, loss of consciousness and headaches.  Psychiatric/Behavioral: Negative for depression. The patient is not nervous/anxious and does not have insomnia.      Objective:     Today's Vitals   01/31/17 0940  BP: 110/74  Pulse: 61  Resp: 14  Temp: (!) 97.4 F (36.3 C)  SpO2: 96%  Weight: 158 lb 6.4 oz (71.8 kg)  Height: 5\' 3"  (1.6 m)  PainSc: 5    Body mass index is 28.06 kg/m.  General appearance: alert, no distress, WD/WN, female HEENT: normocephalic, sclerae anicteric, TMs pearly, nares patent, no discharge or erythema, pharynx normal Oral cavity: MMM, no lesions Neck: supple, no lymphadenopathy, no thyromegaly, no masses Heart: RRR, normal S1, S2, no murmurs Lungs: CTA bilaterally, no wheezes, rhonchi, or rales Abdomen: +bs, soft, non tender, non distended, no masses, no hepatomegaly, no splenomegaly Musculoskeletal: nontender, no swelling, no obvious deformity Extremities: no edema, no cyanosis, no clubbing Pulses: 2+ symmetric, upper and lower extremities, normal cap refill Neurological: alert, oriented x 3, CN2-12 intact, strength normal upper extremities and lower extremities, sensation normal throughout, DTRs 2+ throughout, no cerebellar signs, gait normal Psychiatric: normal affect, behavior normal, pleasant   Medicare Attestation I have personally reviewed: The patient's medical and social history Their use of alcohol, tobacco or illicit drugs Their current medications and supplements The  patient's functional ability including ADLs,fall risks, home safety risks, cognitive, and hearing and visual impairment Diet and physical activities Evidence for depression or mood disorders  The patient's weight, height, BMI, and visual acuity have been recorded in the chart.  I have made referrals, counseling, and provided education to the patient based on review of the above and I have provided the patient with a written personalized care plan for preventive services.     Vicie Mutters, PA-C   01/31/2017

## 2017-01-31 ENCOUNTER — Encounter: Payer: Self-pay | Admitting: Physician Assistant

## 2017-01-31 ENCOUNTER — Ambulatory Visit (INDEPENDENT_AMBULATORY_CARE_PROVIDER_SITE_OTHER): Payer: Medicare Other | Admitting: Physician Assistant

## 2017-01-31 VITALS — BP 110/74 | HR 61 | Temp 97.4°F | Resp 14 | Ht 63.0 in | Wt 158.4 lb

## 2017-01-31 DIAGNOSIS — Z0001 Encounter for general adult medical examination with abnormal findings: Secondary | ICD-10-CM

## 2017-01-31 DIAGNOSIS — E559 Vitamin D deficiency, unspecified: Secondary | ICD-10-CM | POA: Diagnosis not present

## 2017-01-31 DIAGNOSIS — E1121 Type 2 diabetes mellitus with diabetic nephropathy: Secondary | ICD-10-CM | POA: Diagnosis not present

## 2017-01-31 DIAGNOSIS — R6889 Other general symptoms and signs: Secondary | ICD-10-CM

## 2017-01-31 DIAGNOSIS — I10 Essential (primary) hypertension: Secondary | ICD-10-CM | POA: Diagnosis not present

## 2017-01-31 DIAGNOSIS — Z79899 Other long term (current) drug therapy: Secondary | ICD-10-CM | POA: Diagnosis not present

## 2017-01-31 DIAGNOSIS — E782 Mixed hyperlipidemia: Secondary | ICD-10-CM

## 2017-01-31 DIAGNOSIS — J449 Chronic obstructive pulmonary disease, unspecified: Secondary | ICD-10-CM

## 2017-01-31 DIAGNOSIS — Z0000 Encounter for general adult medical examination without abnormal findings: Secondary | ICD-10-CM

## 2017-01-31 LAB — CBC WITH DIFFERENTIAL/PLATELET
Basophils Absolute: 0 cells/uL (ref 0–200)
Basophils Relative: 0 %
EOS ABS: 210 {cells}/uL (ref 15–500)
Eosinophils Relative: 3 %
HEMATOCRIT: 42.8 % (ref 35.0–45.0)
HEMOGLOBIN: 14 g/dL (ref 11.7–15.5)
LYMPHS ABS: 1960 {cells}/uL (ref 850–3900)
LYMPHS PCT: 28 %
MCH: 31.7 pg (ref 27.0–33.0)
MCHC: 32.7 g/dL (ref 32.0–36.0)
MCV: 96.8 fL (ref 80.0–100.0)
MONO ABS: 700 {cells}/uL (ref 200–950)
MPV: 10.5 fL (ref 7.5–12.5)
Monocytes Relative: 10 %
NEUTROS PCT: 59 %
Neutro Abs: 4130 cells/uL (ref 1500–7800)
Platelets: 197 10*3/uL (ref 140–400)
RBC: 4.42 MIL/uL (ref 3.80–5.10)
RDW: 14.2 % (ref 11.0–15.0)
WBC: 7 10*3/uL (ref 3.8–10.8)

## 2017-01-31 LAB — BASIC METABOLIC PANEL WITH GFR
BUN: 16 mg/dL (ref 7–25)
CALCIUM: 9.3 mg/dL (ref 8.6–10.4)
CO2: 25 mmol/L (ref 20–31)
Chloride: 103 mmol/L (ref 98–110)
Creat: 0.66 mg/dL (ref 0.60–0.93)
GFR, EST NON AFRICAN AMERICAN: 85 mL/min (ref >=60)
GLUCOSE: 102 mg/dL — AB (ref 65–99)
POTASSIUM: 4.1 mmol/L (ref 3.5–5.3)
Sodium: 140 mmol/L (ref 135–146)

## 2017-01-31 LAB — LIPID PANEL
Cholesterol: 153 mg/dL (ref <200)
HDL: 34 mg/dL — ABNORMAL LOW (ref >50)
LDL CALC: 75 mg/dL (ref <100)
TRIGLYCERIDES: 222 mg/dL — AB (ref <150)
Total CHOL/HDL Ratio: 4.5 Ratio (ref <5.0)
VLDL: 44 mg/dL — AB (ref <30)

## 2017-01-31 LAB — HEPATIC FUNCTION PANEL
ALK PHOS: 46 U/L (ref 33–130)
ALT: 24 U/L (ref 6–29)
AST: 21 U/L (ref 10–35)
Albumin: 4.4 g/dL (ref 3.6–5.1)
BILIRUBIN INDIRECT: 1 mg/dL (ref 0.2–1.2)
Bilirubin, Direct: 0.2 mg/dL (ref <=0.2)
TOTAL PROTEIN: 6.5 g/dL (ref 6.1–8.1)
Total Bilirubin: 1.2 mg/dL (ref 0.2–1.2)

## 2017-01-31 MED ORDER — CYCLOBENZAPRINE HCL 10 MG PO TABS: 10.0000 mg | ORAL_TABLET | Freq: Every evening | ORAL | 0 refills | Status: DC | PRN

## 2017-01-31 NOTE — Patient Instructions (Signed)
What is the TMJ? The temporomandibular (tem-PUH-ro-man-DIB-yoo-ler) joint, or the TMJ, connects the upper and lower jawbones. This joint allows the jaw to open wide and move back and forth when you chew, talk, or yawn.There are also several muscles that help this joint move. There can be muscle tightness and pain in the muscle that can cause several symptoms.  What causes TMJ pain? There are many causes of TMJ pain. Repeated chewing (for example, chewing gum) and clenching your teeth can cause pain in the joint. Some TMJ pain has no obvious cause. What can I do to ease the pain? There are many things you can do to help your pain get better. When you have pain:  Eat soft foods and stay away from chewy foods (for example, taffy) Try to use both sides of your mouth to chew Don't chew gum Massage Don't open your mouth wide (for example, during yawning or singing) Don't bite your cheeks or fingernails Lower your amount of stress and worry Applying a warm, damp washcloth to the joint may help. Over-the-counter pain medicines such as ibuprofen (one brand: Advil) or acetaminophen (one brand: Tylenol) might also help. Do not use these medicines if you are allergic to them or if your doctor told you not to use them. How can I stop the pain from coming back? When your pain is better, you can do these exercises to make your muscles stronger and to keep the pain from coming back:  Resisted mouth opening: Place your thumb or two fingers under your chin and open your mouth slowly, pushing up lightly on your chin with your thumb. Hold for three to six seconds. Close your mouth slowly. Resisted mouth closing: Place your thumbs under your chin and your two index fingers on the ridge between your mouth and the bottom of your chin. Push down lightly on your chin as you close your mouth. Tongue up: Slowly open and close your mouth while keeping the tongue touching the roof of the mouth. Side-to-side jaw movement:  Place an object about one fourth of an inch thick (for example, two tongue depressors) between your front teeth. Slowly move your jaw from side to side. Increase the thickness of the object as the exercise becomes easier Forward jaw movement: Place an object about one fourth of an inch thick between your front teeth and move the bottom jaw forward so that the bottom teeth are in front of the top teeth. Increase the thickness of the object as the exercise becomes easier. These exercises should not be painful. If it hurts to do these exercises, stop doing them and talk to your family doctor.     

## 2017-02-01 LAB — HEMOGLOBIN A1C
Hgb A1c MFr Bld: 5.7 % — ABNORMAL HIGH (ref <5.7)
MEAN PLASMA GLUCOSE: 117 mg/dL

## 2017-02-01 LAB — TSH: TSH: 2.64 mIU/L

## 2017-02-01 LAB — MAGNESIUM: MAGNESIUM: 1.8 mg/dL (ref 1.5–2.5)

## 2017-02-03 DIAGNOSIS — H10503 Unspecified blepharoconjunctivitis, bilateral: Secondary | ICD-10-CM | POA: Diagnosis not present

## 2017-03-07 IMAGING — CR DG THORACIC SPINE 3V
3 series · 3 of 3 positions shown · non-contrast
Comparison: Chest radiograph performed 01/17/2011

CLINICAL DATA: Acute onset of worsening lower thoracic spine pain.
Initial encounter.

EXAM:
THORACIC SPINE - 3 VIEWS

[t-spine ap]
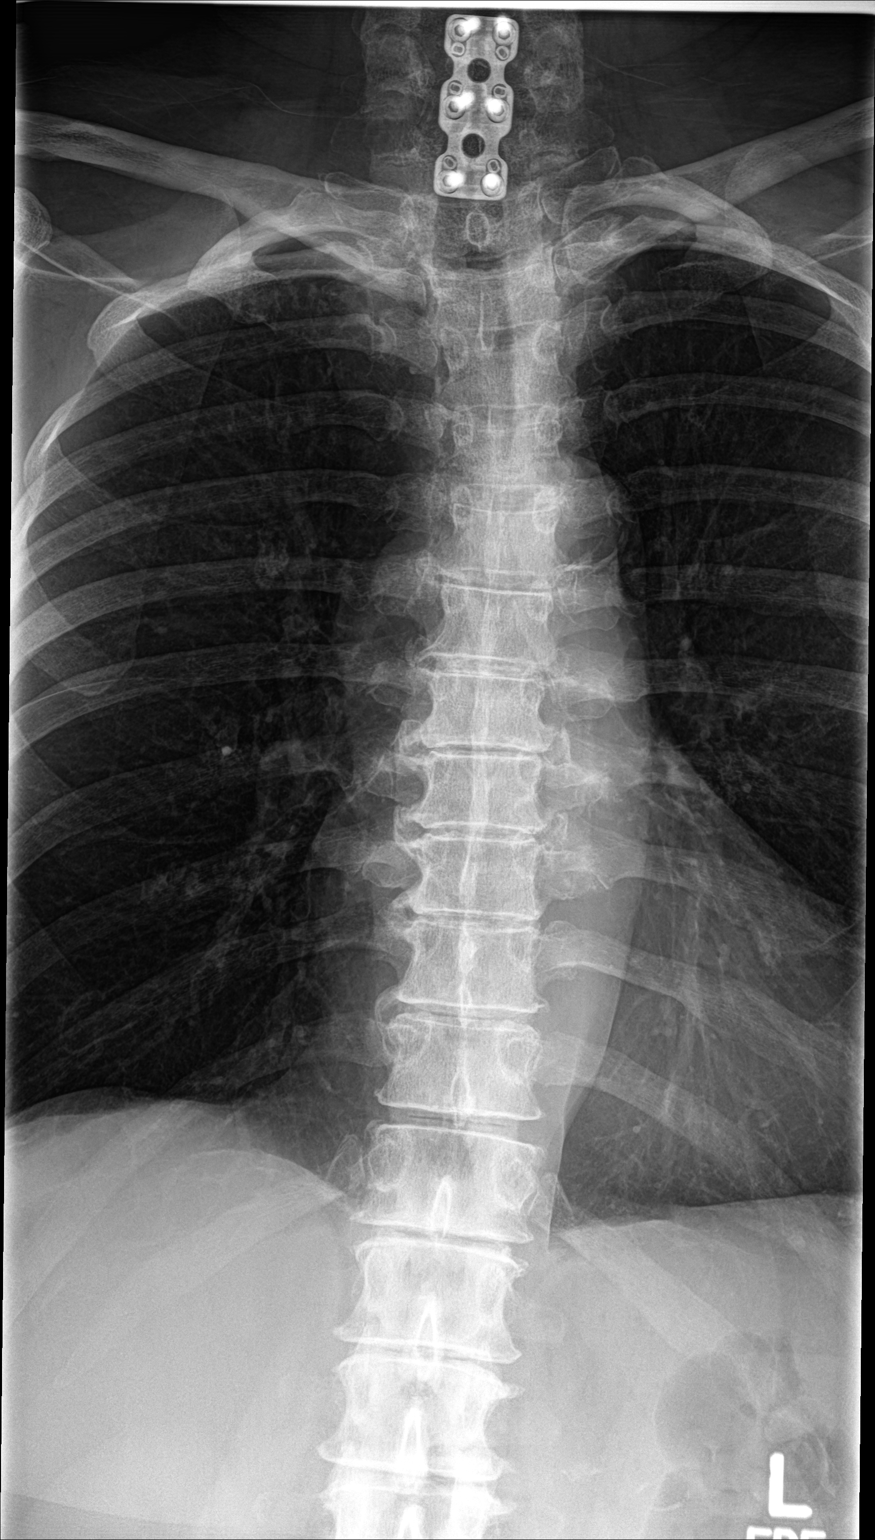

[t-spine lat]
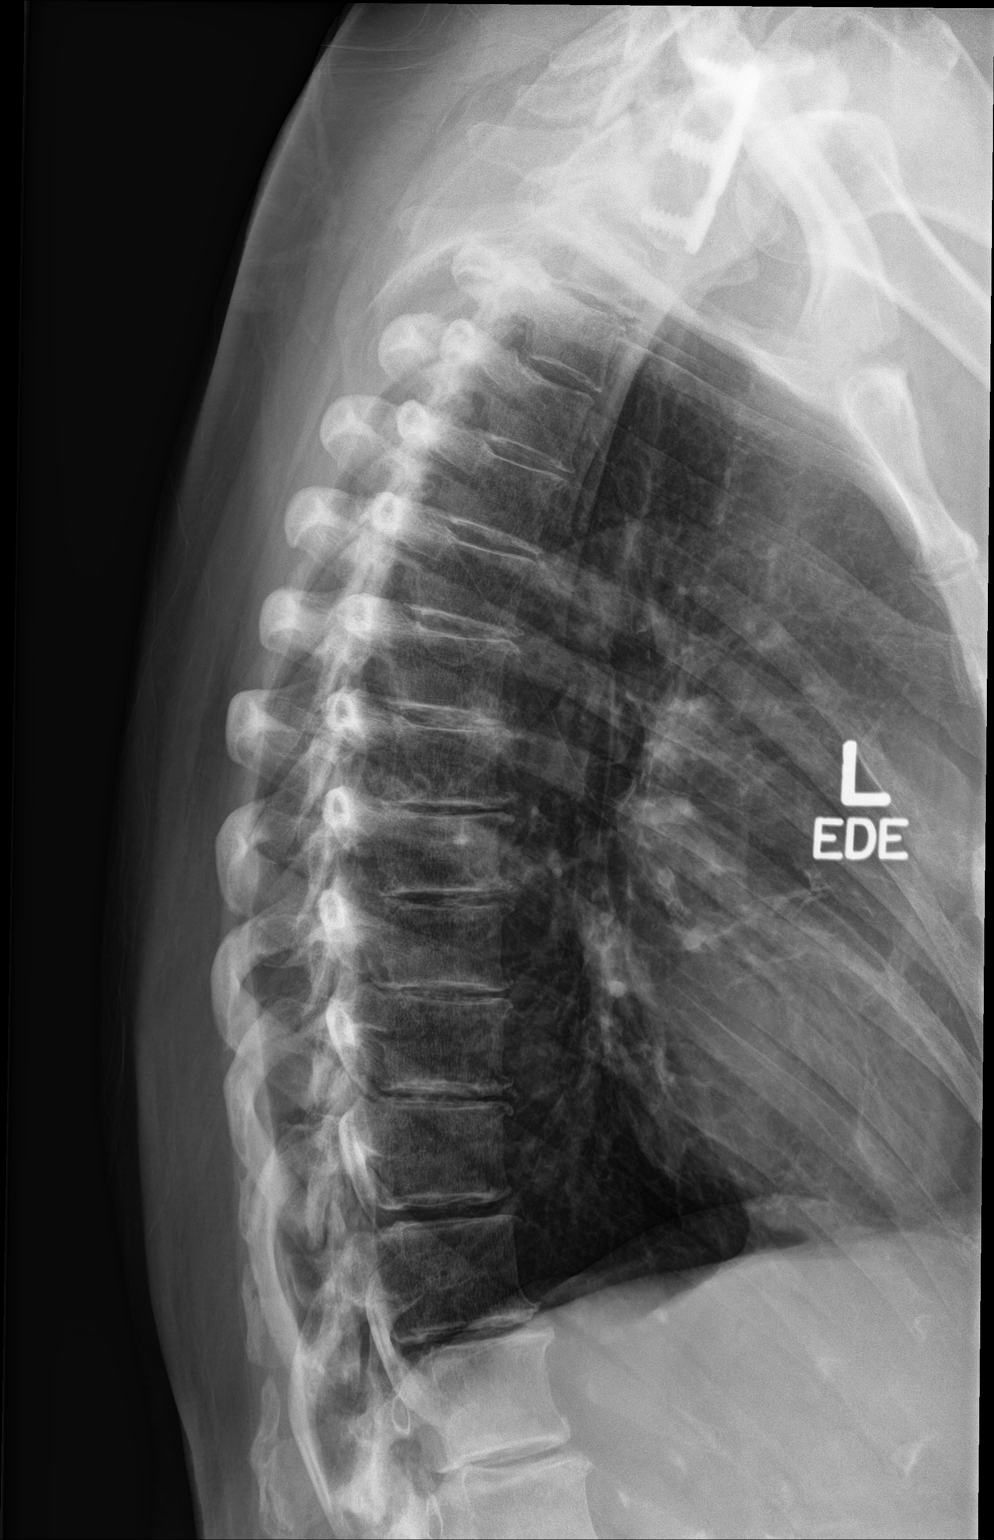

[t-spine swimmers]
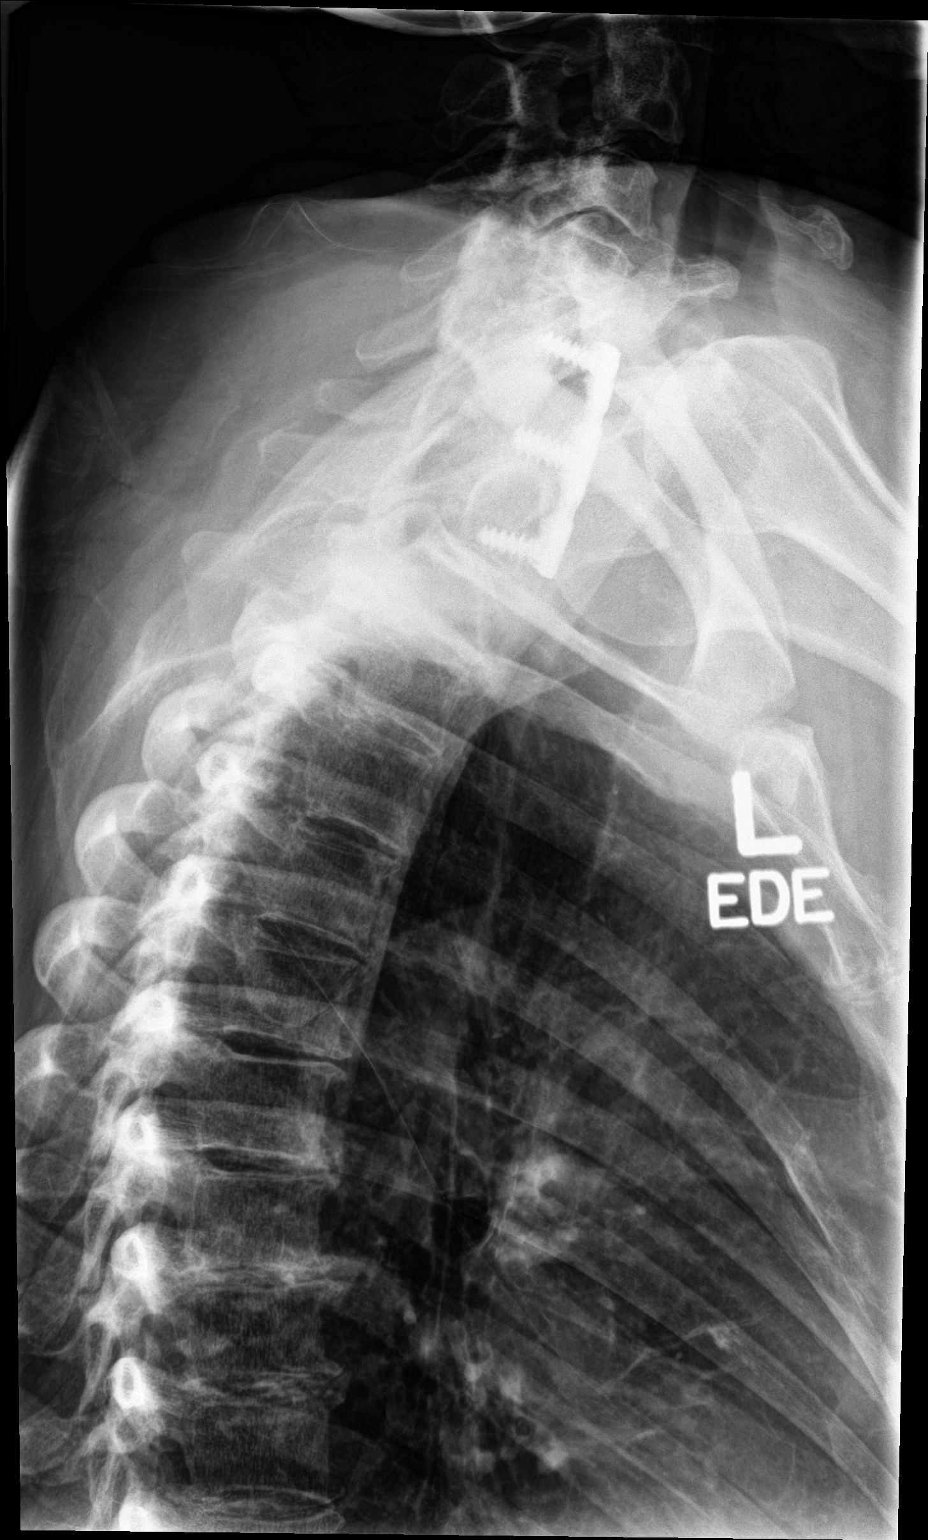

[3 of 3 positions shown; findings below may reference images not displayed]

FINDINGS: There is no evidence of fracture or subluxation. Vertebral bodies
demonstrate normal height and alignment. Intervertebral disc spaces
are preserved. Cervical spinal fusion hardware is noted.

The visualized portions of both lungs are clear. The mediastinum is
unremarkable in appearance.
IMPRESSION: No evidence of fracture or subluxation along the thoracic spine.

## 2017-03-07 IMAGING — CR DG RIBS W/ CHEST 3+V*R*
3 series · 3 of 3 positions shown · non-contrast
Comparison: Chest radiograph 01/17/2011

CLINICAL DATA: RIGHT lateral and posterior chest pain and thoracic
pain for 4 days worse with positional change

EXAM:
RIGHT RIBS AND CHEST - 3+ VIEW

[chest pa]
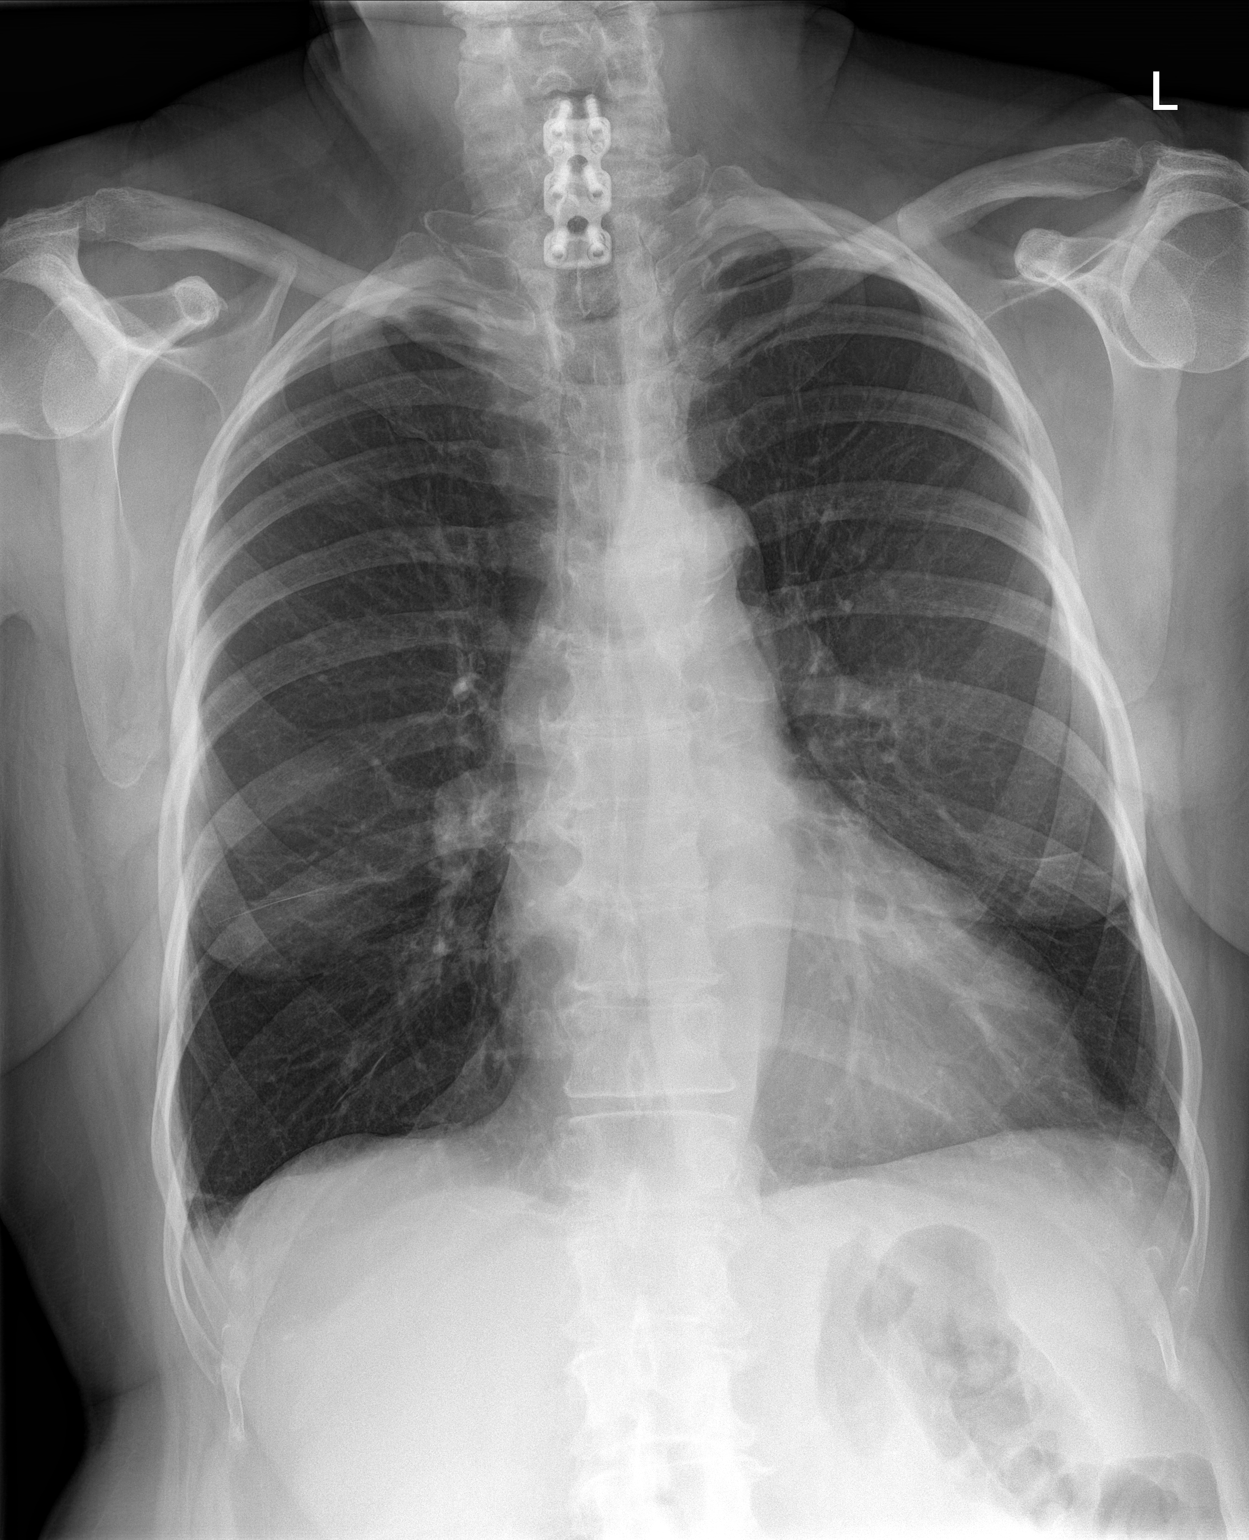

[rib pa]
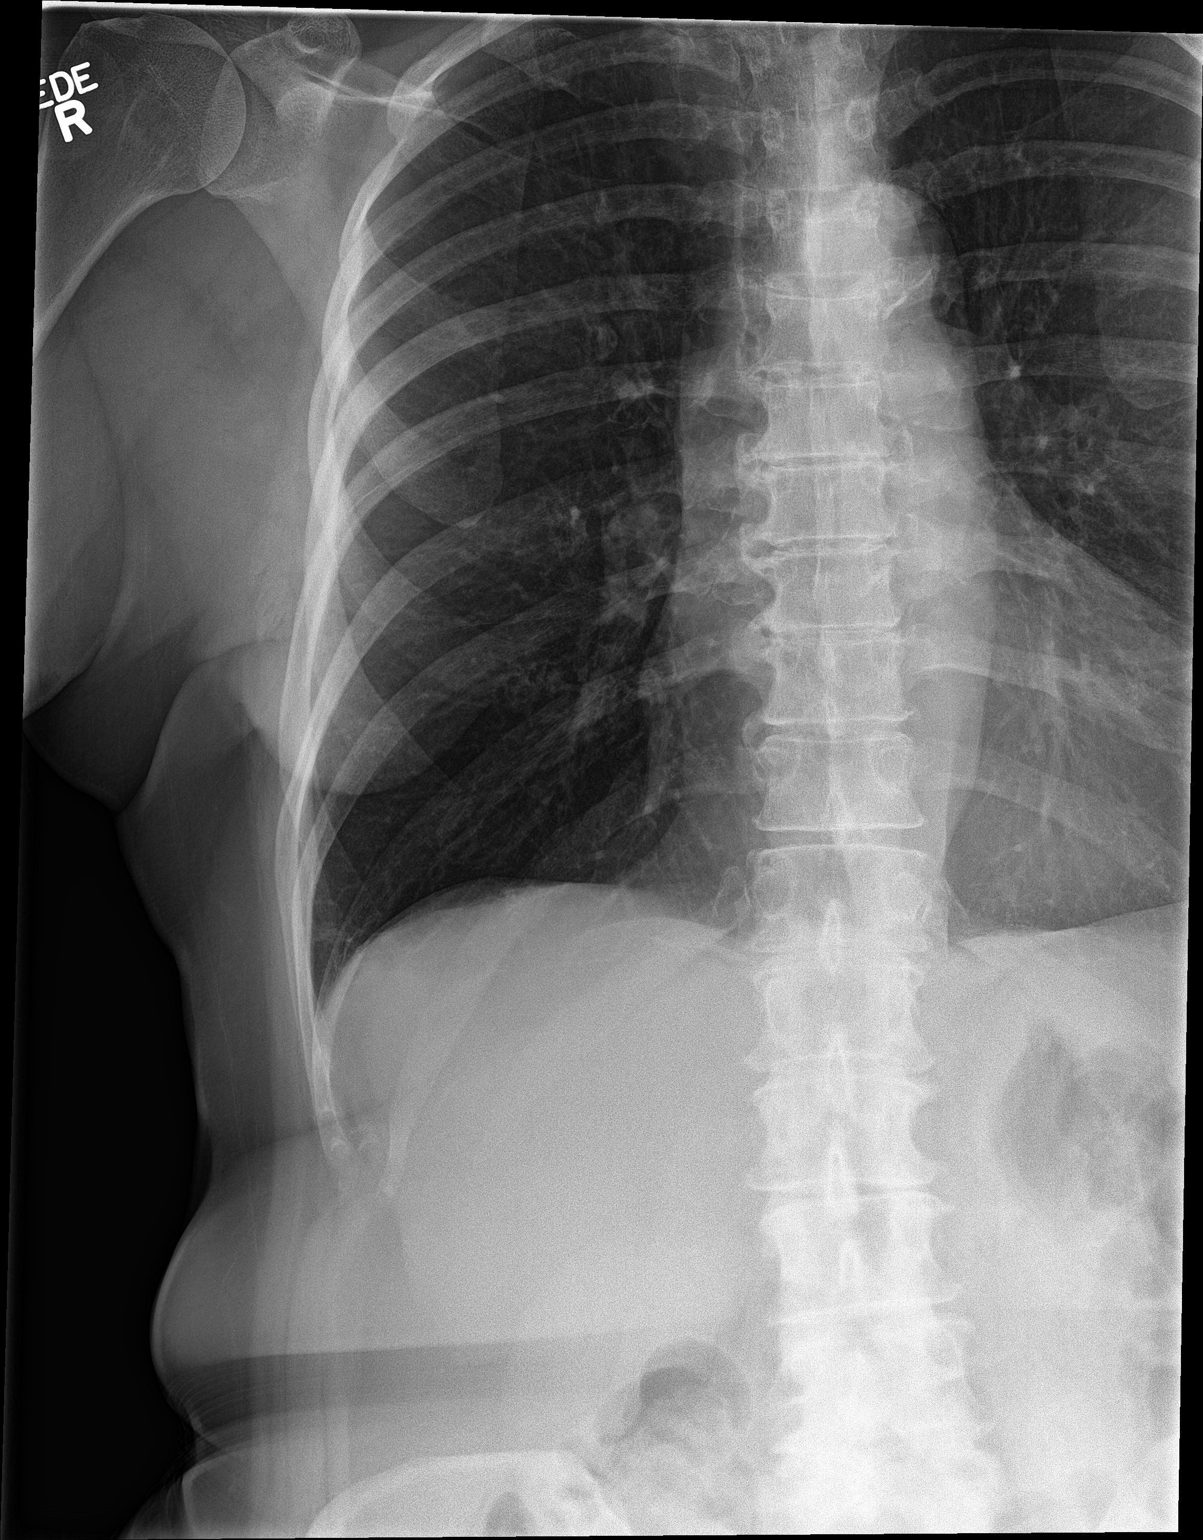

[rib obl]
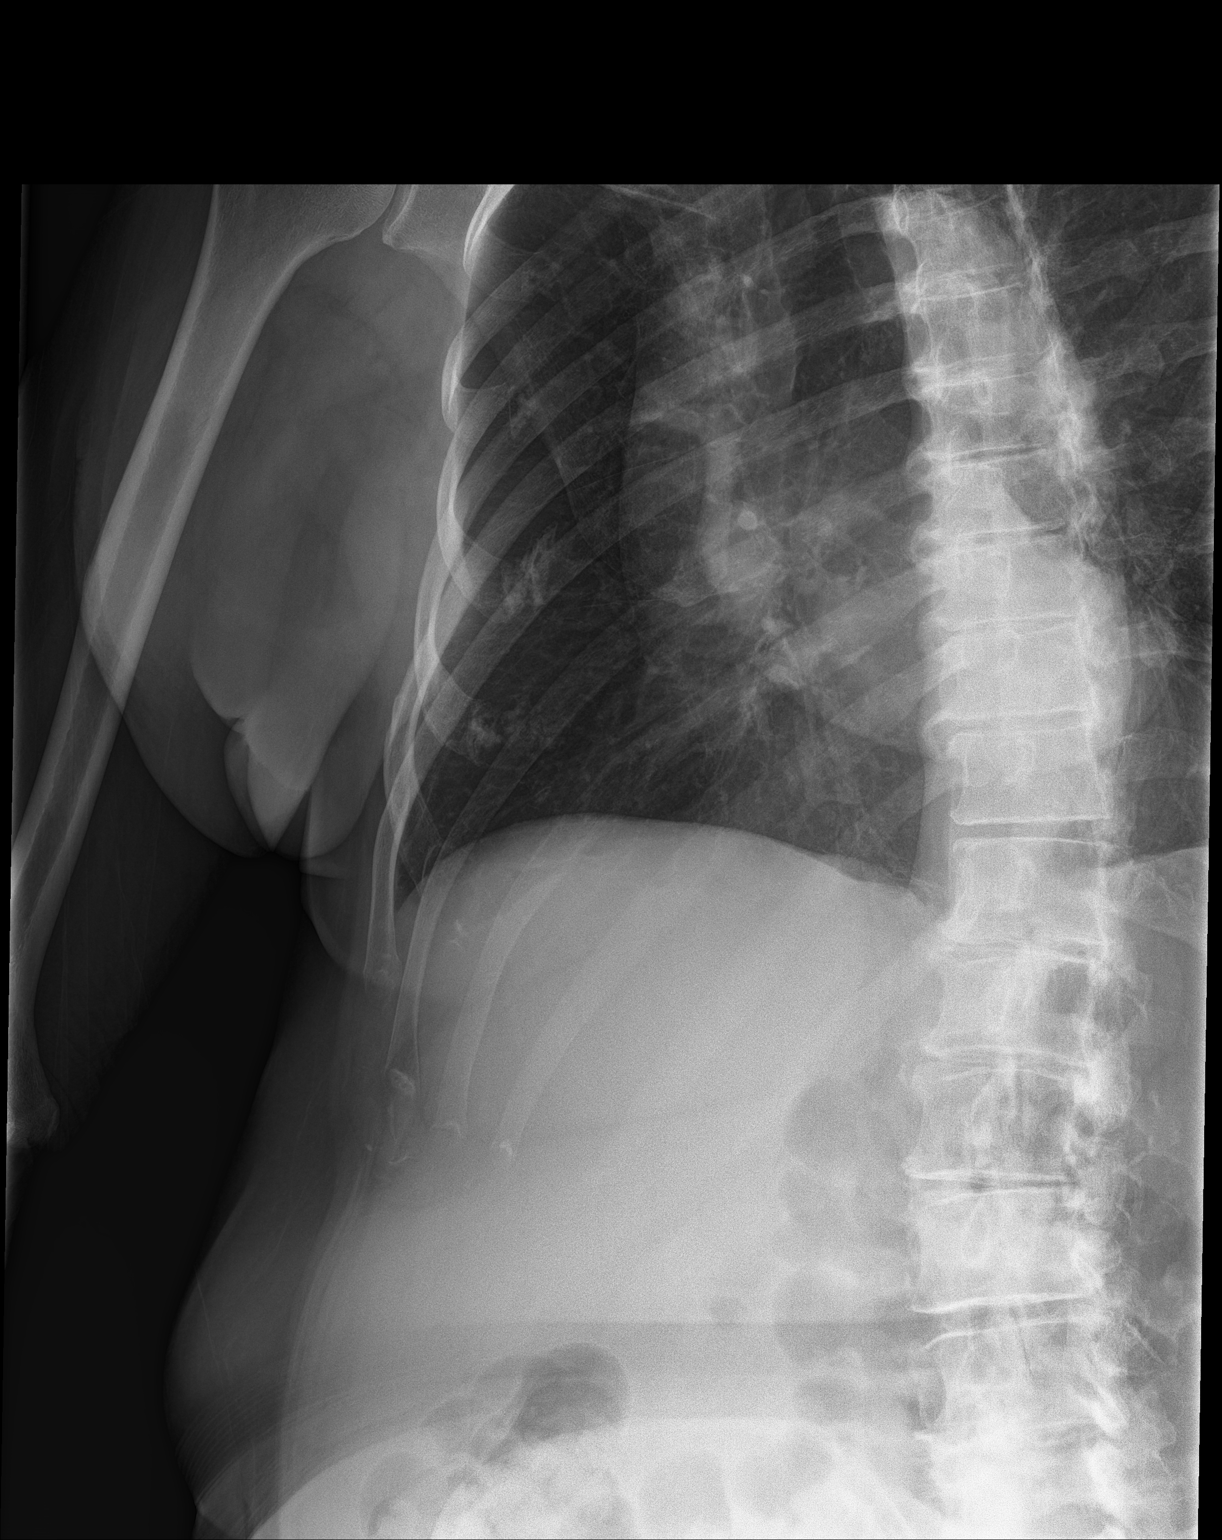

[3 of 3 positions shown; findings below may reference images not displayed]

FINDINGS: Enlargement of cardiac silhouette.

Atherosclerotic calcification aorta.

Mediastinal contours and pulmonary vascularity normal.

Minimal scarring in lingula and at RIGHT base stable.

Underline emphysematous changes suspected.

No definite infiltrate, pleural effusion or pneumothorax.

Bones mildly demineralized.

Prior cervical spine fusion.

Slight irregularity of the lateral RIGHT sixth rib question
age-indeterminate fracture.

No additional focal osseous abnormalities identified.
IMPRESSION: Enlargement of cardiac silhouette with suspected COPD changes and
minimal bibasilar scarring.

Question age-indeterminate fracture of the lateral RIGHT sixth rib.

Aortic atherosclerosis.

## 2017-04-17 DIAGNOSIS — E119 Type 2 diabetes mellitus without complications: Secondary | ICD-10-CM | POA: Diagnosis not present

## 2017-04-17 DIAGNOSIS — H2513 Age-related nuclear cataract, bilateral: Secondary | ICD-10-CM | POA: Diagnosis not present

## 2017-04-17 LAB — HM DIABETES EYE EXAM

## 2017-04-23 ENCOUNTER — Encounter: Payer: Self-pay | Admitting: Internal Medicine

## 2017-05-07 ENCOUNTER — Encounter: Payer: Self-pay | Admitting: Internal Medicine

## 2017-05-07 ENCOUNTER — Ambulatory Visit (INDEPENDENT_AMBULATORY_CARE_PROVIDER_SITE_OTHER): Payer: Medicare Other | Admitting: Internal Medicine

## 2017-05-07 VITALS — BP 124/68 | HR 60 | Temp 97.3°F | Resp 16 | Ht 63.0 in | Wt 162.8 lb

## 2017-05-07 DIAGNOSIS — E559 Vitamin D deficiency, unspecified: Secondary | ICD-10-CM | POA: Diagnosis not present

## 2017-05-07 DIAGNOSIS — N182 Chronic kidney disease, stage 2 (mild): Secondary | ICD-10-CM

## 2017-05-07 DIAGNOSIS — Z79899 Other long term (current) drug therapy: Secondary | ICD-10-CM | POA: Diagnosis not present

## 2017-05-07 DIAGNOSIS — E1122 Type 2 diabetes mellitus with diabetic chronic kidney disease: Secondary | ICD-10-CM | POA: Diagnosis not present

## 2017-05-07 DIAGNOSIS — Z23 Encounter for immunization: Secondary | ICD-10-CM | POA: Diagnosis not present

## 2017-05-07 DIAGNOSIS — I10 Essential (primary) hypertension: Secondary | ICD-10-CM

## 2017-05-07 DIAGNOSIS — E782 Mixed hyperlipidemia: Secondary | ICD-10-CM | POA: Diagnosis not present

## 2017-05-07 NOTE — Patient Instructions (Signed)

## 2017-05-07 NOTE — Progress Notes (Signed)
This very nice 81 y.o. MWF presents for 6 month follow up with Hypertension, Hyperlipidemia, Pre-Diabetes and Vitamin D Deficiency. Patient was seen by Dr Tat in April for ? of gait abnormality and apparently exam & w/u including Cx MRI was Negative.      Patient is treated for HTN (1992) & BP has been controlled at home. Today's BP is at goal -  124/68. Patient has had no complaints of any cardiac type chest pain, palpitations, dyspnea / orthopnea / PND, dizziness, claudication, or dependent edema.     Hyperlipidemia is controlled with diet & meds. Patient denies myalgias or other med SE's. Last Lipids were at goal albeit elevated Trig's: Lab Results  Component Value Date   CHOL 153 01/31/2017   HDL 34 (L) 01/31/2017   LDLCALC 75 01/31/2017   TRIG 222 (H) 01/31/2017   CHOLHDL 4.5 01/31/2017      Also, the patient has history of T2_DM  (A1c 6.5% in 2009 &2012  and  A1c 6.4% in 2016)  She has had no symptoms of reactive hypoglycemia, diabetic polys, paresthesias or visual blurring.  Last A1c in the prediabetic range was almost to goal: Lab Results  Component Value Date   HGBA1C 5.7 (H) 01/31/2017      Further, the patient also has history of Vitamin D Deficiency and supplements vitamin D sporadically. Last vitamin D was still low (goal 70-100) : Lab Results  Component Value Date   VD25OH 39 10/17/2016   Current Outpatient Prescriptions on File Prior to Visit  Medication Sig  . aspirin 81 MG tablet Take 81 mg by mouth daily.  Marland Kitchen atenolol (TENORMIN) 100 MG tablet Take 1 tablet (100 mg total) by mouth daily. for blood pressure  . atorvastatin (LIPITOR) 80 MG tablet Take 1 tablet by mouth  every day for cholesterol  . Calcium Citrate (CALCITRATE PO) Take 500 mg by mouth daily.  . Cholecalciferol (VITAMIN D PO) Take 5,000 Units by mouth 2 (two) times daily.   . cyclobenzaprine (FLEXERIL) 10 MG tablet Take 1 tablet (10 mg total) by mouth at bedtime as needed for muscle spasms.  Marland Kitchen  lisinopril (PRINIVIL,ZESTRIL) 20 MG tablet TAKE 1 TABLET BY MOUTH IN  THE EVENING FOR BLOOD  PRESSURE  . metFORMIN (GLUCOPHAGE-XR) 500 MG 24 hr tablet TAKE 1 TABLET BY MOUTH WITH BREAKFAST AND LUNCH AND 2  WITH SUPPER FOR DIABETES  . MILK THISTLE PO Take by mouth 2 (two) times daily.  . Multiple Vitamin (MULTIVITAMIN) tablet Take 1 tablet by mouth daily.  . nortriptyline (PAMELOR) 10 MG capsule 1-2 at bed time for nerve pain.  . Probiotic Product (PROBIOTIC DAILY PO) Take by mouth.  . verapamil (CALAN) 120 MG tablet TAKE 1 TABLET BY MOUTH  TWICE A DAY  . vitamin B-12 (CYANOCOBALAMIN) 1000 MCG tablet Take 1,000 mcg by mouth daily.   No current facility-administered medications on file prior to visit.    Allergies  Allergen Reactions  . Diltiazem    PMHx:   Past Medical History:  Diagnosis Date  . DDD (degenerative disc disease), lumbar   . Fatty liver disease, nonalcoholic    CT AB 0076  . Hyperlipidemia   . Hypertension   . Type II or unspecified type diabetes mellitus without mention of complication, not stated as uncontrolled   . Vitamin D deficiency    Immunization History  Administered Date(s) Administered  . H1N1 06/28/2008  . Influenza Whole 05/04/2013  . Influenza, High Dose Seasonal  PF 04/26/2014, 04/26/2015, 04/01/2016, 05/07/2017  . Pneumococcal Conjugate-13 04/26/2014  . Pneumococcal Polysaccharide-23 07/10/2011  . Td 07/10/2011  . Zoster 06/28/2008   Past Surgical History:  Procedure Laterality Date  . APPENDECTOMY    . KNEE ARTHROSCOPY Left   . MASTECTOMY Bilateral    30 years ago   FHx:    Reviewed / unchanged  SHx:    Reviewed / unchanged  Systems Review:  Constitutional: Denies fever, chills, wt changes, headaches, insomnia, fatigue, night sweats, change in appetite. Eyes: Denies redness, blurred vision, diplopia, discharge, itchy, watery eyes.  ENT: Denies discharge, congestion, post nasal drip, epistaxis, sore throat, earache, hearing loss, dental  pain, tinnitus, vertigo, sinus pain, snoring.  CV: Denies chest pain, palpitations, irregular heartbeat, syncope, dyspnea, diaphoresis, orthopnea, PND, claudication or edema. Respiratory: denies cough, dyspnea, DOE, pleurisy, hoarseness, laryngitis, wheezing.  Gastrointestinal: Denies dysphagia, odynophagia, heartburn, reflux, water brash, abdominal pain or cramps, nausea, vomiting, bloating, diarrhea, constipation, hematemesis, melena, hematochezia  or hemorrhoids. Genitourinary: Denies dysuria, frequency, urgency, nocturia, hesitancy, discharge, hematuria or flank pain. Musculoskeletal: Denies arthralgias, myalgias, stiffness, jt. swelling, pain, limping or strain/sprain.  Skin: Denies pruritus, rash, hives, warts, acne, eczema or change in skin lesion(s). Neuro: No weakness, tremor, incoordination, spasms, paresthesia or pain. Psychiatric: Denies confusion, memory loss or sensory loss. Endo: Denies change in weight, skin or hair change.  Heme/Lymph: No excessive bleeding, bruising or enlarged lymph nodes.  Physical Exam  BP 124/68   Pulse 60   Temp (!) 97.3 F (36.3 C)   Resp 16   Ht 5\' 3"  (1.6 m)   Wt 162 lb 12.8 oz (73.8 kg)   BMI 28.84 kg/m   Appears well nourished, well groomed  and in no distress.  Eyes: PERRLA, EOMs, conjunctiva no swelling or erythema. Sinuses: No frontal/maxillary tenderness ENT/Mouth: EAC's clear, TM's nl w/o erythema, bulging. Nares clear w/o erythema, swelling, exudates. Oropharynx clear without erythema or exudates. Oral hygiene is good. Tongue normal, non obstructing. Hearing intact.  Neck: Supple. Thyroid nl. Car 2+/2+ without bruits, nodes or JVD. Chest: Respirations nl with BS clear & equal w/o rales, rhonchi, wheezing or stridor.  Cor: Heart sounds normal w/ regular rate and rhythm without sig. murmurs, gallops, clicks or rubs. Peripheral pulses normal and equal  without edema.  Abdomen: Soft & bowel sounds normal. Non-tender w/o guarding, rebound,  hernias, masses or organomegaly.  Lymphatics: Unremarkable.  Musculoskeletal: Full ROM all peripheral extremities, joint stability, 5/5 strength and normal gait.  Skin: Warm, dry without exposed rashes, lesions or ecchymosis apparent.  Neuro: Cranial nerves intact, reflexes equal bilaterally. Sensory-motor testing grossly intact. Tendon reflexes grossly intact.  Pysch: Alert & oriented x 3.  Insight and judgement nl & appropriate. No ideations.  Assessment and Plan:  1. Essential hypertension  - Continue medication, monitor blood pressure at home.  - Continue DASH diet. Reminder to go to the ER if any CP,  SOB, nausea, dizziness, severe HA, changes vision/speech. - CBC with Differential/Platelet - BASIC METABOLIC PANEL WITH GFR - Magnesium - TSH  2. Hyperlipidemia, mixed  - Continue diet/meds, exercise,& lifestyle modifications.  - Continue monitor periodic cholesterol/liver & renal functions  - Hepatic function panel - Lipid panel - TSH  3. Type 2 diabetes mellitus with stage 2 chronic kidney disease, without long-term current use of insulin (HCC)  - Continue diet, exercise, lifestyle modifications.  - Monitor appropriate labs.  - Hemoglobin A1c - Insulin, random  4. Vitamin D deficiency  - Continue supplementation.  - VITAMIN  D 25 Hydroxy   5. Medication management  - CBC with Differential/Platelet - BASIC METABOLIC PANEL WITH GFR - Hepatic function panel - Magnesium - Lipid panel - TSH - Hemoglobin A1c - Insulin, random - VITAMIN D 25 Hydroxy   6. Need for immunization against influenza  - Flu vaccine HIGH DOSE PF (Fluzone High dose)          Discussed  regular exercise, BP monitoring, weight control to achieve/maintain BMI less than 25 and discussed med and SE's. Recommended labs to assess and monitor clinical status with further disposition pending results of labs. Over 30 minutes of exam, counseling, chart review was performed.

## 2017-05-08 LAB — CBC WITH DIFFERENTIAL/PLATELET
BASOS ABS: 29 {cells}/uL (ref 0–200)
Basophils Relative: 0.5 %
EOS ABS: 200 {cells}/uL (ref 15–500)
Eosinophils Relative: 3.5 %
HEMATOCRIT: 45.8 % — AB (ref 35.0–45.0)
HEMOGLOBIN: 15.4 g/dL (ref 11.7–15.5)
LYMPHS ABS: 1727 {cells}/uL (ref 850–3900)
MCH: 32.3 pg (ref 27.0–33.0)
MCHC: 33.6 g/dL (ref 32.0–36.0)
MCV: 96 fL (ref 80.0–100.0)
MPV: 10.6 fL (ref 7.5–12.5)
Monocytes Relative: 10.4 %
NEUTROS ABS: 3152 {cells}/uL (ref 1500–7800)
NEUTROS PCT: 55.3 %
Platelets: 184 10*3/uL (ref 140–400)
RBC: 4.77 10*6/uL (ref 3.80–5.10)
RDW: 14.2 % (ref 11.0–15.0)
Total Lymphocyte: 30.3 %
WBC: 5.7 10*3/uL (ref 3.8–10.8)
WBCMIX: 593 {cells}/uL (ref 200–950)

## 2017-05-08 LAB — VITAMIN D 25 HYDROXY (VIT D DEFICIENCY, FRACTURES): VIT D 25 HYDROXY: 51 ng/mL (ref 30–100)

## 2017-05-08 LAB — HEPATIC FUNCTION PANEL
AG RATIO: 2.4 (calc) (ref 1.0–2.5)
ALKALINE PHOSPHATASE (APISO): 48 U/L (ref 33–130)
ALT: 26 U/L (ref 6–29)
AST: 22 U/L (ref 10–35)
Albumin: 4.8 g/dL (ref 3.6–5.1)
BILIRUBIN DIRECT: 0.2 mg/dL (ref 0.0–0.2)
BILIRUBIN INDIRECT: 1 mg/dL (ref 0.2–1.2)
GLOBULIN: 2 g/dL (ref 1.9–3.7)
TOTAL PROTEIN: 6.8 g/dL (ref 6.1–8.1)
Total Bilirubin: 1.2 mg/dL (ref 0.2–1.2)

## 2017-05-08 LAB — BASIC METABOLIC PANEL WITH GFR
BUN: 13 mg/dL (ref 7–25)
CHLORIDE: 102 mmol/L (ref 98–110)
CO2: 29 mmol/L (ref 20–32)
CREATININE: 0.69 mg/dL (ref 0.60–0.93)
Calcium: 9.7 mg/dL (ref 8.6–10.4)
GFR, EST AFRICAN AMERICAN: 96 mL/min/{1.73_m2} (ref >=60)
GFR, Est Non African American: 83 mL/min/{1.73_m2} (ref >=60)
Glucose, Bld: 143 mg/dL — ABNORMAL HIGH (ref 65–99)
Potassium: 4.2 mmol/L (ref 3.5–5.3)
SODIUM: 140 mmol/L (ref 135–146)

## 2017-05-08 LAB — LIPID PANEL
CHOL/HDL RATIO: 4.6 (calc) (ref <5.0)
CHOLESTEROL: 161 mg/dL (ref <200)
HDL: 35 mg/dL — ABNORMAL LOW (ref >50)
LDL CHOLESTEROL (CALC): 95 mg/dL
Non-HDL Cholesterol (Calc): 126 mg/dL (calc) (ref <130)
Triglycerides: 217 mg/dL — ABNORMAL HIGH (ref <150)

## 2017-05-08 LAB — TSH: TSH: 3.28 m[IU]/L (ref 0.40–4.50)

## 2017-05-08 LAB — HEMOGLOBIN A1C
Hgb A1c MFr Bld: 5.8 % of total Hgb — ABNORMAL HIGH (ref <5.7)
MEAN PLASMA GLUCOSE: 120 (calc)
eAG (mmol/L): 6.6 (calc)

## 2017-05-08 LAB — INSULIN, RANDOM: Insulin: 35.3 u[IU]/mL — ABNORMAL HIGH (ref 2.0–19.6)

## 2017-05-08 LAB — MAGNESIUM: Magnesium: 1.8 mg/dL (ref 1.5–2.5)

## 2017-05-12 ENCOUNTER — Other Ambulatory Visit: Payer: Self-pay | Admitting: Allergy

## 2017-05-12 ENCOUNTER — Ambulatory Visit
Admission: RE | Admit: 2017-05-12 | Discharge: 2017-05-12 | Disposition: A | Discharge status: Home / Self Care | Payer: Medicare Other | Source: Ambulatory Visit | Attending: Allergy | Admitting: Allergy

## 2017-05-12 DIAGNOSIS — R05 Cough: Secondary | ICD-10-CM | POA: Diagnosis not present

## 2017-05-12 DIAGNOSIS — R059 Cough, unspecified: Secondary | ICD-10-CM

## 2017-05-12 DIAGNOSIS — J3081 Allergic rhinitis due to animal (cat) (dog) hair and dander: Secondary | ICD-10-CM | POA: Diagnosis not present

## 2017-05-12 DIAGNOSIS — J309 Allergic rhinitis, unspecified: Secondary | ICD-10-CM | POA: Diagnosis not present

## 2017-05-12 DIAGNOSIS — J301 Allergic rhinitis due to pollen: Secondary | ICD-10-CM | POA: Diagnosis not present

## 2017-05-13 ENCOUNTER — Other Ambulatory Visit: Payer: Self-pay | Admitting: Internal Medicine

## 2017-05-19 DIAGNOSIS — J301 Allergic rhinitis due to pollen: Secondary | ICD-10-CM | POA: Diagnosis not present

## 2017-05-19 DIAGNOSIS — J3081 Allergic rhinitis due to animal (cat) (dog) hair and dander: Secondary | ICD-10-CM | POA: Diagnosis not present

## 2017-05-26 DIAGNOSIS — J301 Allergic rhinitis due to pollen: Secondary | ICD-10-CM | POA: Diagnosis not present

## 2017-05-26 DIAGNOSIS — J3081 Allergic rhinitis due to animal (cat) (dog) hair and dander: Secondary | ICD-10-CM | POA: Diagnosis not present

## 2017-05-29 DIAGNOSIS — J3081 Allergic rhinitis due to animal (cat) (dog) hair and dander: Secondary | ICD-10-CM | POA: Diagnosis not present

## 2017-05-29 DIAGNOSIS — J301 Allergic rhinitis due to pollen: Secondary | ICD-10-CM | POA: Diagnosis not present

## 2017-06-02 DIAGNOSIS — J3081 Allergic rhinitis due to animal (cat) (dog) hair and dander: Secondary | ICD-10-CM | POA: Diagnosis not present

## 2017-06-02 DIAGNOSIS — J301 Allergic rhinitis due to pollen: Secondary | ICD-10-CM | POA: Diagnosis not present

## 2017-06-10 DIAGNOSIS — J301 Allergic rhinitis due to pollen: Secondary | ICD-10-CM | POA: Diagnosis not present

## 2017-06-10 DIAGNOSIS — J3081 Allergic rhinitis due to animal (cat) (dog) hair and dander: Secondary | ICD-10-CM | POA: Diagnosis not present

## 2017-06-13 ENCOUNTER — Encounter: Payer: Self-pay | Admitting: Adult Health

## 2017-06-13 ENCOUNTER — Ambulatory Visit (INDEPENDENT_AMBULATORY_CARE_PROVIDER_SITE_OTHER): Payer: Medicare Other | Admitting: Adult Health

## 2017-06-13 VITALS — BP 118/70 | HR 103 | Temp 97.7°F | Ht 63.0 in | Wt 164.0 lb

## 2017-06-13 DIAGNOSIS — R059 Cough, unspecified: Secondary | ICD-10-CM

## 2017-06-13 DIAGNOSIS — J209 Acute bronchitis, unspecified: Secondary | ICD-10-CM | POA: Diagnosis not present

## 2017-06-13 DIAGNOSIS — R011 Cardiac murmur, unspecified: Secondary | ICD-10-CM

## 2017-06-13 DIAGNOSIS — R05 Cough: Secondary | ICD-10-CM

## 2017-06-13 MED ORDER — PROMETHAZINE-DM 6.25-15 MG/5ML PO SYRP: 5.0000 mL | ORAL_SOLUTION | Freq: Four times a day (QID) | ORAL | 1 refills | Status: DC | PRN

## 2017-06-13 MED ORDER — PREDNISONE 20 MG PO TABS: ORAL_TABLET | ORAL | 0 refills | Status: DC

## 2017-06-13 MED ORDER — AZITHROMYCIN 250 MG PO TABS: ORAL_TABLET | ORAL | 1 refills | Status: DC

## 2017-06-13 NOTE — Patient Instructions (Signed)

## 2017-06-13 NOTE — Progress Notes (Signed)
Assessment and Plan:  Maday was seen today for uri.  Diagnoses and all orders for this visit:  Acute bronchitis, unspecified organism Nasal steroids, allergy pill, oral steroids       Suggested symptomatic OTC remedies.       Somewhat dehydrated; encouraged 80-100 + fluid ounces -     predniSONE (DELTASONE) 20 MG tablet; 2 tablets daily for 3 days, 1 tablet daily for 4 days. -     azithromycin (ZITHROMAX) 250 MG tablet; Take 2 tablets (500 mg) on  Day 1,  followed by 1 tablet (250 mg) once daily on Days 2 through 5. -     promethazine-dextromethorphan (PROMETHAZINE-DM) 6.25-15 MG/5ML syrup; Take 5 mLs by mouth 4 (four) times daily as needed for cough.  Newly recognized heart murmur Patient primarily c/o URI type symptoms, denying CP, fever/chills, night sweats, endorses SOB only after severe coughing - unlikely endocarditis Likely flow murmur exacerbated by dehydration; no dizziness, syncope, sense of weakness, CP Will reevaluate at next visit for resolution; consider further workup if persists   Go to the ER if any chest pain, shortness of breath, nausea, dizziness, severe HA, changes vision/speech  Further disposition pending results of labs. Discussed med's effects and SE's.   Over 30 minutes of exam, counseling, chart review, and critical decision making was performed.   Future Appointments  Date Time Provider [place]  08/12/2017  8:45 AM Liane Comber, NP [facility] None  11/14/2017 11:00 AM Unk Pinto, MD [facility] None   ------------------------------------------------------------------------------------------------------------------   HPI BP 118/70   Pulse (!) 103   Temp 97.7 F (36.5 C)   Ht 5\' 3"  (1.6 m)   Wt 164 lb (74.4 kg)   SpO2 94%   BMI 29.05 kg/m   81 y.o.female presents for cough, severe congestion, sore throat/hoarseness, SOB after severe bouts of coughing ongoing for 5-6 days. She denies fever/chills, diaphoresis, weakness,  fatigue/malaise, CP/palpitations, wheezing, N/V/D, HA, dizziness. She has been using flonase, and an old expired cough prescription.   She has severe allergy to cats/dogs/grass/trees and is receiving allergy shots for this by Dr. Gershon Crane; she takes allegra daily.   Documented hx of COPD; patient unaware of diagnosis; currently not treated. Denies dyspnea with exertion at baseline. Patient up to date on recommended pneumonia and influenza vaccinations.   Past Medical History:  Diagnosis Date  . DDD (degenerative disc disease), lumbar   . Fatty liver disease, nonalcoholic    CT AB 1324  . Hyperlipidemia   . Hypertension   . Type II or unspecified type diabetes mellitus without mention of complication, not stated as uncontrolled   . Vitamin D deficiency      Allergies  Allergen Reactions  . Diltiazem     Current Outpatient Medications on File Prior to Visit  Medication Sig  . aspirin 81 MG tablet Take 81 mg by mouth daily.  Marland Kitchen atenolol (TENORMIN) 100 MG tablet TAKE 1 TABLET BY MOUTH  DAILY FOR BLOOD PRESSURE  . atorvastatin (LIPITOR) 80 MG tablet Take 1 tablet by mouth  every day for cholesterol  . Calcium Citrate (CALCITRATE PO) Take 500 mg by mouth daily.  . Cholecalciferol (VITAMIN D PO) Take 5,000 Units by mouth 2 (two) times daily.   . cyclobenzaprine (FLEXERIL) 10 MG tablet Take 1 tablet (10 mg total) by mouth at bedtime as needed for muscle spasms.  Marland Kitchen lisinopril (PRINIVIL,ZESTRIL) 20 MG tablet TAKE 1 TABLET BY MOUTH IN  THE EVENING FOR BLOOD  PRESSURE  . metFORMIN (GLUCOPHAGE-XR)  500 MG 24 hr tablet TAKE 1 TABLET BY MOUTH WITH BREAKFAST AND LUNCH AND 2  WITH SUPPER FOR DIABETES  . MILK THISTLE PO Take by mouth 2 (two) times daily.  . Multiple Vitamin (MULTIVITAMIN) tablet Take 1 tablet by mouth daily.  . nortriptyline (PAMELOR) 10 MG capsule 1-2 at bed time for nerve pain.  . Probiotic Product (PROBIOTIC DAILY PO) Take by mouth.  . verapamil (CALAN) 120 MG tablet TAKE 1 TABLET  BY MOUTH  TWICE A DAY  . vitamin B-12 (CYANOCOBALAMIN) 1000 MCG tablet Take 1,000 mcg by mouth daily.   No current facility-administered medications on file prior to visit.     ROS: Review of Systems  Constitutional: Negative for chills, diaphoresis, fever, malaise/fatigue and weight loss.  HENT: Positive for congestion and sore throat. Negative for hearing loss and tinnitus.   Eyes: Negative for blurred vision and double vision.  Respiratory: Positive for cough. Negative for hemoptysis, sputum production, shortness of breath and wheezing.   Cardiovascular: Negative for chest pain, palpitations, orthopnea, claudication and leg swelling.  Gastrointestinal: Negative for abdominal pain, blood in stool, constipation, diarrhea, heartburn, melena, nausea and vomiting.  Genitourinary: Negative.   Musculoskeletal: Negative for joint pain and myalgias.  Skin: Negative for rash.  Neurological: Negative for dizziness, tingling, sensory change, weakness and headaches.  Endo/Heme/Allergies: Negative for polydipsia.  Psychiatric/Behavioral: Negative.   All other systems reviewed and are negative.    Physical Exam:  BP 118/70   Pulse (!) 103   Temp 97.7 F (36.5 C)   Ht 5\' 3"  (1.6 m)   Wt 164 lb (74.4 kg)   SpO2 94%   BMI 29.05 kg/m   General Appearance: Well nourished, face flushed, in no acute distress. Eyes: PERRLA, EOMs, conjunctiva no swelling or erythema Sinuses: No Frontal/maxillary tenderness ENT/Mouth: Ext aud canals clear, TMs without erythema, bulging, with bilateral fluid effusion No erythema, swelling, or exudate on post pharynx.  Tonsils not swollen or erythematous. Hearing normal.  Neck: Supple, thyroid normal.  Respiratory: Respiratory effort normal, BS coarse throughout over bronchus; some scattered coarse crackles primarily at bases.  Cardio: RRR with 2/6 early systolic murmur heard equally over L and R 2nd ICS without radiation. Brisk peripheral pulses without edema.   Abdomen: Soft, + BS.  Non tender, no guarding, rebound, hernias, masses. Lymphatics: Non tender without lymphadenopathy.  Musculoskeletal: Full ROM, 5/5 strength, normal gait.  Skin: Warm, dry without rashes, lesions, ecchymosis.  Neuro: Cranial nerves intact. Normal muscle tone, no cerebellar symptoms. Sensation intact.  Psych: Awake and oriented X 3, normal affect, Insight and Judgment appropriate.     Izora Ribas, NP 11:11 AM [facility]

## 2017-06-17 DIAGNOSIS — J301 Allergic rhinitis due to pollen: Secondary | ICD-10-CM | POA: Diagnosis not present

## 2017-06-17 DIAGNOSIS — J3081 Allergic rhinitis due to animal (cat) (dog) hair and dander: Secondary | ICD-10-CM | POA: Diagnosis not present

## 2017-06-20 DIAGNOSIS — J3081 Allergic rhinitis due to animal (cat) (dog) hair and dander: Secondary | ICD-10-CM | POA: Diagnosis not present

## 2017-06-20 DIAGNOSIS — J3089 Other allergic rhinitis: Secondary | ICD-10-CM | POA: Diagnosis not present

## 2017-06-20 DIAGNOSIS — J301 Allergic rhinitis due to pollen: Secondary | ICD-10-CM | POA: Diagnosis not present

## 2017-06-24 DIAGNOSIS — J3081 Allergic rhinitis due to animal (cat) (dog) hair and dander: Secondary | ICD-10-CM | POA: Diagnosis not present

## 2017-06-24 DIAGNOSIS — J3089 Other allergic rhinitis: Secondary | ICD-10-CM | POA: Diagnosis not present

## 2017-06-26 DIAGNOSIS — J301 Allergic rhinitis due to pollen: Secondary | ICD-10-CM | POA: Diagnosis not present

## 2017-06-26 DIAGNOSIS — J3089 Other allergic rhinitis: Secondary | ICD-10-CM | POA: Diagnosis not present

## 2017-06-26 DIAGNOSIS — J3081 Allergic rhinitis due to animal (cat) (dog) hair and dander: Secondary | ICD-10-CM | POA: Diagnosis not present

## 2017-07-01 ENCOUNTER — Other Ambulatory Visit: Payer: Self-pay | Admitting: Internal Medicine

## 2017-07-01 ENCOUNTER — Other Ambulatory Visit: Payer: Self-pay | Admitting: Physician Assistant

## 2017-07-01 DIAGNOSIS — J301 Allergic rhinitis due to pollen: Secondary | ICD-10-CM | POA: Diagnosis not present

## 2017-07-01 DIAGNOSIS — J3081 Allergic rhinitis due to animal (cat) (dog) hair and dander: Secondary | ICD-10-CM | POA: Diagnosis not present

## 2017-07-03 DIAGNOSIS — J3081 Allergic rhinitis due to animal (cat) (dog) hair and dander: Secondary | ICD-10-CM | POA: Diagnosis not present

## 2017-07-03 DIAGNOSIS — J301 Allergic rhinitis due to pollen: Secondary | ICD-10-CM | POA: Diagnosis not present

## 2017-07-17 DIAGNOSIS — J3081 Allergic rhinitis due to animal (cat) (dog) hair and dander: Secondary | ICD-10-CM | POA: Diagnosis not present

## 2017-07-17 DIAGNOSIS — J301 Allergic rhinitis due to pollen: Secondary | ICD-10-CM | POA: Diagnosis not present

## 2017-07-21 DIAGNOSIS — J3081 Allergic rhinitis due to animal (cat) (dog) hair and dander: Secondary | ICD-10-CM | POA: Diagnosis not present

## 2017-07-21 DIAGNOSIS — J301 Allergic rhinitis due to pollen: Secondary | ICD-10-CM | POA: Diagnosis not present

## 2017-07-24 DIAGNOSIS — J3081 Allergic rhinitis due to animal (cat) (dog) hair and dander: Secondary | ICD-10-CM | POA: Diagnosis not present

## 2017-07-24 DIAGNOSIS — J3089 Other allergic rhinitis: Secondary | ICD-10-CM | POA: Diagnosis not present

## 2017-07-24 DIAGNOSIS — J301 Allergic rhinitis due to pollen: Secondary | ICD-10-CM | POA: Diagnosis not present

## 2017-07-29 DIAGNOSIS — J301 Allergic rhinitis due to pollen: Secondary | ICD-10-CM | POA: Diagnosis not present

## 2017-07-29 DIAGNOSIS — J3081 Allergic rhinitis due to animal (cat) (dog) hair and dander: Secondary | ICD-10-CM | POA: Diagnosis not present

## 2017-07-31 DIAGNOSIS — J301 Allergic rhinitis due to pollen: Secondary | ICD-10-CM | POA: Diagnosis not present

## 2017-07-31 DIAGNOSIS — J3089 Other allergic rhinitis: Secondary | ICD-10-CM | POA: Diagnosis not present

## 2017-07-31 DIAGNOSIS — J3081 Allergic rhinitis due to animal (cat) (dog) hair and dander: Secondary | ICD-10-CM | POA: Diagnosis not present

## 2017-08-05 DIAGNOSIS — J3081 Allergic rhinitis due to animal (cat) (dog) hair and dander: Secondary | ICD-10-CM | POA: Diagnosis not present

## 2017-08-05 DIAGNOSIS — J301 Allergic rhinitis due to pollen: Secondary | ICD-10-CM | POA: Diagnosis not present

## 2017-08-07 DIAGNOSIS — J3081 Allergic rhinitis due to animal (cat) (dog) hair and dander: Secondary | ICD-10-CM | POA: Diagnosis not present

## 2017-08-07 DIAGNOSIS — J301 Allergic rhinitis due to pollen: Secondary | ICD-10-CM | POA: Diagnosis not present

## 2017-08-11 DIAGNOSIS — J3081 Allergic rhinitis due to animal (cat) (dog) hair and dander: Secondary | ICD-10-CM | POA: Diagnosis not present

## 2017-08-11 DIAGNOSIS — J301 Allergic rhinitis due to pollen: Secondary | ICD-10-CM | POA: Diagnosis not present

## 2017-08-11 DIAGNOSIS — E663 Overweight: Secondary | ICD-10-CM | POA: Insufficient documentation

## 2017-08-11 DIAGNOSIS — Z6824 Body mass index (BMI) 24.0-24.9, adult: Secondary | ICD-10-CM | POA: Insufficient documentation

## 2017-08-11 NOTE — Progress Notes (Signed)
FOLLOW UP  Assessment and Plan:   Hypertension Well controlled with current medications  Monitor blood pressure at home; patient to call if consistently greater than 130/80 Continue DASH diet.   Reminder to go to the ER if any CP, SOB, nausea, dizziness, severe HA, changes vision/speech, left arm numbness and tingling and jaw pain.  Cholesterol Currently at LDL goal; continue statin - triglycerides remain elevated. Discussed diet at length.  Continue low cholesterol diet and exercise.  Check lipid panel.   Diabetes with diabetic chronic kidney disease Recently well controlled with A1Cs in prediabetic range Continue medication: metformin  Continue diet and exercise.  Perform daily foot/skin check, notify office of any concerning changes.  Check A1C  Overweight Long discussion about weight loss, diet, and exercise Recommended diet heavy in fruits and veggies and low in animal meats, cheeses, and dairy products, appropriate calorie intake Discussed ideal weight for height - would like to get down to 135 lb - initial goal 155 lb Patient will work on starting [employer], restart gym membership Will follow up in 3 months  Vitamin D Def At goal at last visit; continue supplementation to maintain goal of 70-100 Defer Vit D level  Continue diet and meds as discussed. Further disposition pending results of labs. Discussed med's effects and SE's.   Over 30 minutes of exam, counseling, chart review, and critical decision making was performed.   Future Appointments  Date Time Provider [place]  11/14/2017 11:00 AM Unk Pinto, MD [facility] None    ----------------------------------------------------------------------------------------------------------------------  HPI 82 y.o. female  presents for 3 month follow up on hypertension, cholesterol, T2 diabetes, weight and vitamin D deficiency. She endorses some mild R lower chest wall pain x 3 weeks; no injury or other  new symptoms. Denies n/v/d/abdominal pain. Has taken some ibuprofen which is helping.   BMI is Body mass index is 29.41 kg/m., she has not been working on diet and exercise. She is considering  Wt Readings from Last 3 Encounters:  08/12/17 166 lb (75.3 kg)  06/13/17 164 lb (74.4 kg)  05/07/17 162 lb 12.8 oz (73.8 kg)   Her blood pressure has been controlled at home, today their BP is BP: 132/72  She does not workout. She denies chest pain, shortness of breath, dizziness.   She is on cholesterol medication and denies myalgias. Her LDL cholesterol is at goal; triglycerides remain elevated. The cholesterol last visit was:   Lab Results  Component Value Date   CHOL 161 05/07/2017   HDL 35 (L) 05/07/2017   LDLCALC 75 01/31/2017   TRIG 217 (H) 05/07/2017   CHOLHDL 4.6 05/07/2017    She has not been working on diet and exercise for well controlled T2DM, and denies increased appetite, nausea, paresthesia of the feet, polydipsia, polyuria, visual disturbances and vomiting. Last A1C in the office was:  Lab Results  Component Value Date   HGBA1C 5.8 (H) 05/07/2017   Patient is on Vitamin D supplement but remained below goal of 70 at recent check:    Lab Results  Component Value Date   VD25OH 51 05/07/2017       Current Medications:  Current Outpatient Medications on File Prior to Visit  Medication Sig  . aspirin 81 MG tablet Take 81 mg by mouth daily.  Marland Kitchen atenolol (TENORMIN) 100 MG tablet TAKE 1 TABLET BY MOUTH  DAILY FOR BLOOD PRESSURE  . atorvastatin (LIPITOR) 80 MG tablet Take 1 tablet by mouth  every day for cholesterol  .  Calcium Citrate (CALCITRATE PO) Take 500 mg by mouth daily.  . Cholecalciferol (VITAMIN D PO) Take 5,000 Units by mouth 2 (two) times daily.   . cyclobenzaprine (FLEXERIL) 10 MG tablet Take 1 tablet (10 mg total) by mouth at bedtime as needed for muscle spasms.  Marland Kitchen lisinopril (PRINIVIL,ZESTRIL) 20 MG tablet TAKE 1 TABLET BY MOUTH IN  THE EVENING FOR BLOOD  PRESSURE   . metFORMIN (GLUCOPHAGE-XR) 500 MG 24 hr tablet TAKE 1 TABLET BY MOUTH WITH BREAKFAST AND LUNCH AND 2  WITH SUPPER FOR DIABETES  . MILK THISTLE PO Take by mouth 2 (two) times daily.  . Multiple Vitamin (MULTIVITAMIN) tablet Take 1 tablet by mouth daily.  . nortriptyline (PAMELOR) 10 MG capsule 1-2 at bed time for nerve pain.  . predniSONE (DELTASONE) 20 MG tablet 2 tablets daily for 3 days, 1 tablet daily for 4 days.  . Probiotic Product (PROBIOTIC DAILY PO) Take by mouth.  . promethazine-dextromethorphan (PROMETHAZINE-DM) 6.25-15 MG/5ML syrup Take 5 mLs by mouth 4 (four) times daily as needed for cough.  . verapamil (CALAN) 120 MG tablet TAKE 1 TABLET BY MOUTH  TWICE A DAY  . vitamin B-12 (CYANOCOBALAMIN) 1000 MCG tablet Take 1,000 mcg by mouth daily.   No current facility-administered medications on file prior to visit.      Allergies:  Allergies  Allergen Reactions  . Diltiazem      Medical History:  Past Medical History:  Diagnosis Date  . DDD (degenerative disc disease), lumbar   . Fatty liver disease, nonalcoholic    CT AB 0272  . Hyperlipidemia   . Hypertension   . Type II or unspecified type diabetes mellitus without mention of complication, not stated as uncontrolled   . Vitamin D deficiency    Family history- Reviewed and unchanged Social history- Reviewed and unchanged   Review of Systems:  Review of Systems  Constitutional: Negative for malaise/fatigue and weight loss.  HENT: Negative for hearing loss and tinnitus.   Eyes: Negative for blurred vision and double vision.  Respiratory: Negative for cough, shortness of breath and wheezing.   Cardiovascular: Negative for chest pain, palpitations, orthopnea, claudication and leg swelling.  Gastrointestinal: Negative for abdominal pain, blood in stool, constipation, diarrhea, heartburn, melena, nausea and vomiting.  Genitourinary: Negative.   Musculoskeletal: Positive for joint pain (Right lower chest wall pain).  Negative for myalgias.  Skin: Negative for rash.  Neurological: Negative for dizziness, tingling, sensory change, weakness and headaches.  Endo/Heme/Allergies: Negative for polydipsia.  Psychiatric/Behavioral: Negative.   All other systems reviewed and are negative.   Physical Exam: BP 132/72   Pulse 61   Temp 97.7 F (36.5 C)   Ht 5\' 3"  (1.6 m)   Wt 166 lb (75.3 kg)   SpO2 98%   BMI 29.41 kg/m  Wt Readings from Last 3 Encounters:  08/12/17 166 lb (75.3 kg)  06/13/17 164 lb (74.4 kg)  05/07/17 162 lb 12.8 oz (73.8 kg)   General Appearance: Well nourished, in no apparent distress. Eyes: PERRLA, EOMs, conjunctiva no swelling or erythema Sinuses: No Frontal/maxillary tenderness ENT/Mouth: Ext aud canals clear, TMs without erythema, bulging. No erythema, swelling, or exudate on post pharynx.  Tonsils not swollen or erythematous. Hearing normal.  Neck: Supple, thyroid normal.  Respiratory: Respiratory effort normal, BS equal bilaterally without rales, rhonchi, wheezing or stridor.  Cardio: RRR with no MRGs. Brisk peripheral pulses without edema.  Abdomen: Soft, + BS.  Non tender, no guarding, rebound, hernias, masses. Lymphatics: Non tender  without lymphadenopathy.  Musculoskeletal: Full ROM, 5/5 strength, Normal gait Skin: Warm, dry without rashes, lesions, ecchymosis.  Neuro: Cranial nerves intact. No cerebellar symptoms.  Psych: Awake and oriented X 3, normal affect, Insight and Judgment appropriate.    Izora Ribas, NP 8:59 AM [facility]

## 2017-08-12 ENCOUNTER — Ambulatory Visit (INDEPENDENT_AMBULATORY_CARE_PROVIDER_SITE_OTHER): Payer: Medicare Other | Admitting: Adult Health

## 2017-08-12 ENCOUNTER — Encounter: Payer: Self-pay | Admitting: Adult Health

## 2017-08-12 VITALS — BP 132/72 | HR 61 | Temp 97.7°F | Ht 63.0 in | Wt 166.0 lb

## 2017-08-12 DIAGNOSIS — E782 Mixed hyperlipidemia: Secondary | ICD-10-CM

## 2017-08-12 DIAGNOSIS — Z79899 Other long term (current) drug therapy: Secondary | ICD-10-CM | POA: Diagnosis not present

## 2017-08-12 DIAGNOSIS — E1121 Type 2 diabetes mellitus with diabetic nephropathy: Secondary | ICD-10-CM

## 2017-08-12 DIAGNOSIS — E559 Vitamin D deficiency, unspecified: Secondary | ICD-10-CM | POA: Diagnosis not present

## 2017-08-12 DIAGNOSIS — E663 Overweight: Secondary | ICD-10-CM

## 2017-08-12 DIAGNOSIS — K219 Gastro-esophageal reflux disease without esophagitis: Secondary | ICD-10-CM | POA: Diagnosis not present

## 2017-08-12 DIAGNOSIS — I10 Essential (primary) hypertension: Secondary | ICD-10-CM

## 2017-08-12 MED ORDER — MELOXICAM 15 MG PO TABS: ORAL_TABLET | ORAL | 1 refills | Status: DC

## 2017-08-12 MED ORDER — RANITIDINE HCL 150 MG PO TABS: 150.0000 mg | ORAL_TABLET | Freq: Two times a day (BID) | ORAL | 1 refills | Status: DC

## 2017-08-12 NOTE — Patient Instructions (Signed)
Weight loss goal for next visit: 155 lb - end goal 135 lb  Recommend restarting weight watchers and gym  Goal is 150 min weekly for exercise           When it comes to diets, agreement about the perfect plan isn't easy to find, even among the experts. Experts at the [employer] developed an idea known as the Healthy Eating Plate. Just imagine a plate divided into logical, healthy portions.  The emphasis is on diet quality:  Load up on vegetables and fruits - one-half of your plate: Aim for color and variety, and remember that potatoes don't count.  Go for whole grains - one-quarter of your plate: Whole wheat, barley, wheat berries, quinoa, oats, brown rice, and foods made with them. If you want pasta, go with whole wheat pasta.  Protein power - one-quarter of your plate: Fish, chicken, beans, and nuts are all healthy, versatile protein sources. Limit red meat.  The diet, however, does go beyond the plate, offering a few other suggestions.  Use healthy plant oils, such as olive, canola, soy, corn, sunflower and peanut. Check the labels, and avoid partially hydrogenated oil, which have unhealthy trans fats.  If you're thirsty, drink water. Coffee and tea are good in moderation, but skip sugary drinks and limit milk and dairy products to one or two daily servings.  The type of carbohydrate in the diet is more important than the amount. Some sources of carbohydrates, such as vegetables, fruits, whole grains, and beans-are healthier than others.  Finally, stay active.

## 2017-08-13 LAB — HEPATIC FUNCTION PANEL
AG Ratio: 1.8 (calc) (ref 1.0–2.5)
ALBUMIN MSPROF: 4.2 g/dL (ref 3.6–5.1)
ALT: 21 U/L (ref 6–29)
AST: 16 U/L (ref 10–35)
Alkaline phosphatase (APISO): 56 U/L (ref 33–130)
BILIRUBIN TOTAL: 0.7 mg/dL (ref 0.2–1.2)
Bilirubin, Direct: 0.1 mg/dL (ref 0.0–0.2)
Globulin: 2.3 g/dL (calc) (ref 1.9–3.7)
Indirect Bilirubin: 0.6 mg/dL (calc) (ref 0.2–1.2)
Total Protein: 6.5 g/dL (ref 6.1–8.1)

## 2017-08-13 LAB — VITAMIN D 25 HYDROXY (VIT D DEFICIENCY, FRACTURES): VIT D 25 HYDROXY: 43 ng/mL (ref 30–100)

## 2017-08-13 LAB — CBC WITH DIFFERENTIAL/PLATELET
Basophils Absolute: 28 cells/uL (ref 0–200)
Basophils Relative: 0.5 %
EOS PCT: 2.5 %
Eosinophils Absolute: 138 cells/uL (ref 15–500)
HEMATOCRIT: 39 % (ref 35.0–45.0)
HEMOGLOBIN: 13.3 g/dL (ref 11.7–15.5)
LYMPHS ABS: 1408 {cells}/uL (ref 850–3900)
MCH: 32 pg (ref 27.0–33.0)
MCHC: 34.1 g/dL (ref 32.0–36.0)
MCV: 94 fL (ref 80.0–100.0)
MPV: 10.5 fL (ref 7.5–12.5)
Monocytes Relative: 13.4 %
NEUTROS ABS: 3190 {cells}/uL (ref 1500–7800)
NEUTROS PCT: 58 %
Platelets: 225 10*3/uL (ref 140–400)
RBC: 4.15 10*6/uL (ref 3.80–5.10)
RDW: 13 % (ref 11.0–15.0)
Total Lymphocyte: 25.6 %
WBC mixed population: 737 cells/uL (ref 200–950)
WBC: 5.5 10*3/uL (ref 3.8–10.8)

## 2017-08-13 LAB — BASIC METABOLIC PANEL WITH GFR
BUN: 12 mg/dL (ref 7–25)
CALCIUM: 9.4 mg/dL (ref 8.6–10.4)
CO2: 29 mmol/L (ref 20–32)
Chloride: 106 mmol/L (ref 98–110)
Creat: 0.64 mg/dL (ref 0.60–0.93)
GFR, EST NON AFRICAN AMERICAN: 85 mL/min/{1.73_m2} (ref >=60)
GFR, Est African American: 98 mL/min/{1.73_m2} (ref >=60)
Glucose, Bld: 123 mg/dL — ABNORMAL HIGH (ref 65–99)
POTASSIUM: 4.8 mmol/L (ref 3.5–5.3)
Sodium: 144 mmol/L (ref 135–146)

## 2017-08-13 LAB — HEMOGLOBIN A1C
EAG (MMOL/L): 7.4 (calc)
HEMOGLOBIN A1C: 6.3 %{Hb} — AB (ref <5.7)
Mean Plasma Glucose: 134 (calc)

## 2017-08-13 LAB — LIPID PANEL
CHOLESTEROL: 139 mg/dL (ref <200)
HDL: 33 mg/dL — ABNORMAL LOW (ref >50)
LDL CHOLESTEROL (CALC): 80 mg/dL
Non-HDL Cholesterol (Calc): 106 mg/dL (calc) (ref <130)
Total CHOL/HDL Ratio: 4.2 (calc) (ref <5.0)
Triglycerides: 156 mg/dL — ABNORMAL HIGH (ref <150)

## 2017-08-13 LAB — TSH: TSH: 3.29 m[IU]/L (ref 0.40–4.50)

## 2017-08-14 DIAGNOSIS — J301 Allergic rhinitis due to pollen: Secondary | ICD-10-CM | POA: Diagnosis not present

## 2017-08-14 DIAGNOSIS — J3081 Allergic rhinitis due to animal (cat) (dog) hair and dander: Secondary | ICD-10-CM | POA: Diagnosis not present

## 2017-08-18 DIAGNOSIS — J301 Allergic rhinitis due to pollen: Secondary | ICD-10-CM | POA: Diagnosis not present

## 2017-08-18 DIAGNOSIS — J3089 Other allergic rhinitis: Secondary | ICD-10-CM | POA: Diagnosis not present

## 2017-08-18 DIAGNOSIS — J3081 Allergic rhinitis due to animal (cat) (dog) hair and dander: Secondary | ICD-10-CM | POA: Diagnosis not present

## 2017-08-22 DIAGNOSIS — J3081 Allergic rhinitis due to animal (cat) (dog) hair and dander: Secondary | ICD-10-CM | POA: Diagnosis not present

## 2017-08-22 DIAGNOSIS — J301 Allergic rhinitis due to pollen: Secondary | ICD-10-CM | POA: Diagnosis not present

## 2017-08-25 DIAGNOSIS — J301 Allergic rhinitis due to pollen: Secondary | ICD-10-CM | POA: Diagnosis not present

## 2017-08-25 DIAGNOSIS — J3089 Other allergic rhinitis: Secondary | ICD-10-CM | POA: Diagnosis not present

## 2017-08-25 DIAGNOSIS — H1045 Other chronic allergic conjunctivitis: Secondary | ICD-10-CM | POA: Diagnosis not present

## 2017-08-25 DIAGNOSIS — K219 Gastro-esophageal reflux disease without esophagitis: Secondary | ICD-10-CM | POA: Diagnosis not present

## 2017-08-25 DIAGNOSIS — J3081 Allergic rhinitis due to animal (cat) (dog) hair and dander: Secondary | ICD-10-CM | POA: Diagnosis not present

## 2017-08-28 DIAGNOSIS — J3081 Allergic rhinitis due to animal (cat) (dog) hair and dander: Secondary | ICD-10-CM | POA: Diagnosis not present

## 2017-08-28 DIAGNOSIS — J3089 Other allergic rhinitis: Secondary | ICD-10-CM | POA: Diagnosis not present

## 2017-08-28 DIAGNOSIS — J301 Allergic rhinitis due to pollen: Secondary | ICD-10-CM | POA: Diagnosis not present

## 2017-09-02 DIAGNOSIS — J301 Allergic rhinitis due to pollen: Secondary | ICD-10-CM | POA: Diagnosis not present

## 2017-09-02 DIAGNOSIS — J3081 Allergic rhinitis due to animal (cat) (dog) hair and dander: Secondary | ICD-10-CM | POA: Diagnosis not present

## 2017-09-05 DIAGNOSIS — J3081 Allergic rhinitis due to animal (cat) (dog) hair and dander: Secondary | ICD-10-CM | POA: Diagnosis not present

## 2017-09-05 DIAGNOSIS — J301 Allergic rhinitis due to pollen: Secondary | ICD-10-CM | POA: Diagnosis not present

## 2017-09-09 DIAGNOSIS — H25013 Cortical age-related cataract, bilateral: Secondary | ICD-10-CM | POA: Diagnosis not present

## 2017-09-09 DIAGNOSIS — H1013 Acute atopic conjunctivitis, bilateral: Secondary | ICD-10-CM | POA: Diagnosis not present

## 2017-09-09 DIAGNOSIS — J301 Allergic rhinitis due to pollen: Secondary | ICD-10-CM | POA: Diagnosis not present

## 2017-09-09 DIAGNOSIS — H2511 Age-related nuclear cataract, right eye: Secondary | ICD-10-CM | POA: Diagnosis not present

## 2017-09-09 DIAGNOSIS — J3081 Allergic rhinitis due to animal (cat) (dog) hair and dander: Secondary | ICD-10-CM | POA: Diagnosis not present

## 2017-09-09 DIAGNOSIS — H25011 Cortical age-related cataract, right eye: Secondary | ICD-10-CM | POA: Diagnosis not present

## 2017-09-09 DIAGNOSIS — H2513 Age-related nuclear cataract, bilateral: Secondary | ICD-10-CM | POA: Diagnosis not present

## 2017-09-11 DIAGNOSIS — J3081 Allergic rhinitis due to animal (cat) (dog) hair and dander: Secondary | ICD-10-CM | POA: Diagnosis not present

## 2017-09-11 DIAGNOSIS — J301 Allergic rhinitis due to pollen: Secondary | ICD-10-CM | POA: Diagnosis not present

## 2017-09-12 HISTORY — PX: CATARACT EXTRACTION, BILATERAL: SHX1313

## 2017-09-16 DIAGNOSIS — J3081 Allergic rhinitis due to animal (cat) (dog) hair and dander: Secondary | ICD-10-CM | POA: Diagnosis not present

## 2017-09-16 DIAGNOSIS — J301 Allergic rhinitis due to pollen: Secondary | ICD-10-CM | POA: Diagnosis not present

## 2017-09-23 DIAGNOSIS — J301 Allergic rhinitis due to pollen: Secondary | ICD-10-CM | POA: Diagnosis not present

## 2017-09-23 DIAGNOSIS — J3081 Allergic rhinitis due to animal (cat) (dog) hair and dander: Secondary | ICD-10-CM | POA: Diagnosis not present

## 2017-09-29 DIAGNOSIS — J301 Allergic rhinitis due to pollen: Secondary | ICD-10-CM | POA: Diagnosis not present

## 2017-09-29 DIAGNOSIS — J3081 Allergic rhinitis due to animal (cat) (dog) hair and dander: Secondary | ICD-10-CM | POA: Diagnosis not present

## 2017-09-29 DIAGNOSIS — J3089 Other allergic rhinitis: Secondary | ICD-10-CM | POA: Diagnosis not present

## 2017-10-01 DIAGNOSIS — H2511 Age-related nuclear cataract, right eye: Secondary | ICD-10-CM | POA: Diagnosis not present

## 2017-10-01 DIAGNOSIS — H25011 Cortical age-related cataract, right eye: Secondary | ICD-10-CM | POA: Diagnosis not present

## 2017-10-01 DIAGNOSIS — H25012 Cortical age-related cataract, left eye: Secondary | ICD-10-CM | POA: Diagnosis not present

## 2017-10-01 DIAGNOSIS — H2512 Age-related nuclear cataract, left eye: Secondary | ICD-10-CM | POA: Diagnosis not present

## 2017-10-06 DIAGNOSIS — J3081 Allergic rhinitis due to animal (cat) (dog) hair and dander: Secondary | ICD-10-CM | POA: Diagnosis not present

## 2017-10-06 DIAGNOSIS — J301 Allergic rhinitis due to pollen: Secondary | ICD-10-CM | POA: Diagnosis not present

## 2017-10-08 DIAGNOSIS — H25012 Cortical age-related cataract, left eye: Secondary | ICD-10-CM | POA: Diagnosis not present

## 2017-10-08 DIAGNOSIS — H2512 Age-related nuclear cataract, left eye: Secondary | ICD-10-CM | POA: Diagnosis not present

## 2017-10-13 DIAGNOSIS — J3089 Other allergic rhinitis: Secondary | ICD-10-CM | POA: Diagnosis not present

## 2017-10-13 DIAGNOSIS — J301 Allergic rhinitis due to pollen: Secondary | ICD-10-CM | POA: Diagnosis not present

## 2017-10-13 DIAGNOSIS — J3081 Allergic rhinitis due to animal (cat) (dog) hair and dander: Secondary | ICD-10-CM | POA: Diagnosis not present

## 2017-10-21 DIAGNOSIS — J3081 Allergic rhinitis due to animal (cat) (dog) hair and dander: Secondary | ICD-10-CM | POA: Diagnosis not present

## 2017-10-21 DIAGNOSIS — J301 Allergic rhinitis due to pollen: Secondary | ICD-10-CM | POA: Diagnosis not present

## 2017-10-28 DIAGNOSIS — J3081 Allergic rhinitis due to animal (cat) (dog) hair and dander: Secondary | ICD-10-CM | POA: Diagnosis not present

## 2017-10-28 DIAGNOSIS — J3089 Other allergic rhinitis: Secondary | ICD-10-CM | POA: Diagnosis not present

## 2017-10-28 DIAGNOSIS — J301 Allergic rhinitis due to pollen: Secondary | ICD-10-CM | POA: Diagnosis not present

## 2017-11-05 DIAGNOSIS — J3089 Other allergic rhinitis: Secondary | ICD-10-CM | POA: Diagnosis not present

## 2017-11-05 DIAGNOSIS — J3081 Allergic rhinitis due to animal (cat) (dog) hair and dander: Secondary | ICD-10-CM | POA: Diagnosis not present

## 2017-11-05 DIAGNOSIS — J301 Allergic rhinitis due to pollen: Secondary | ICD-10-CM | POA: Diagnosis not present

## 2017-11-06 ENCOUNTER — Other Ambulatory Visit: Payer: Self-pay | Admitting: Physician Assistant

## 2017-11-06 ENCOUNTER — Other Ambulatory Visit: Payer: Self-pay | Admitting: Adult Health

## 2017-11-06 ENCOUNTER — Other Ambulatory Visit: Payer: Self-pay | Admitting: Internal Medicine

## 2017-11-11 DIAGNOSIS — J3089 Other allergic rhinitis: Secondary | ICD-10-CM | POA: Diagnosis not present

## 2017-11-11 DIAGNOSIS — J3081 Allergic rhinitis due to animal (cat) (dog) hair and dander: Secondary | ICD-10-CM | POA: Diagnosis not present

## 2017-11-11 DIAGNOSIS — J301 Allergic rhinitis due to pollen: Secondary | ICD-10-CM | POA: Diagnosis not present

## 2017-11-13 NOTE — Progress Notes (Signed)
[facility] Unk Pinto, M.D.     Uvaldo Bristle. Silverio Lay, P.A.-C Liane Comber, [facility] [address] [place], [place] [address] Telephone [phone] Telefax [phone]   Comprehensive Evaluation &  Examination     This very nice 82 y.o. MWF presents for a  comprehensive evaluation and management of multiple medical co-morbidities.  Patient has been followed for HTN, HLD, T2_NIDDM  Prediabetes  and Vitamin D Deficiency. Patient's GERD is controlled with her meds. Patient has hx/o both Cx & Lumbar DDD and surgeries.       HTN predates circa 1992. Patient's BP has been controlled at home and patient denies any cardiac symptoms as chest pain, palpitations, shortness of breath, dizziness or ankle swelling. Today's BP is at goal - 126/76.      Patient's hyperlipidemia is controlled with diet and medications. Patient denies myalgias or other medication SE's. Last lipids were at goal albeit elevated Trig's: Lab Results  Component Value Date   CHOL 139 08/12/2017   HDL 33 (L) 08/12/2017   LDLCALC 80 08/12/2017   TRIG 156 (H) 08/12/2017   CHOLHDL 4.2 08/12/2017      Patient has T2_NIDDM (A1c 6.5%/2009 & 2012 and 6.4%/2016) and has CKD2.  Patient denies reactive hypoglycemic symptoms, visual blurring, diabetic polys, or paresthesias. Last A1c was not at goal: Lab Results  Component Value Date   HGBA1C 6.3 (H) 08/12/2017      Finally, patient has history of Vitamin D Deficiency ("39"/2018)  and last Vitamin D was low: Lab Results  Component Value Date   VD25OH 43 08/12/2017   Current Outpatient Medications on File Prior to Visit  Medication Sig  . aspirin 81 MG tablet Take 81 mg by mouth daily.  Marland Kitchen atenolol (TENORMIN) 100 MG tablet TAKE 1 TABLET BY MOUTH  DAILY FOR BLOOD PRESSURE  . atorvastatin (LIPITOR) 80 MG tablet Take 1 tablet by mouth  every day for cholesterol  . Calcium Citrate (CALCITRATE PO) Take 500  mg by mouth daily.  . Cholecalciferol (VITAMIN D PO) Take 5,000 Units by mouth 2 (two) times daily.   . cyclobenzaprine (FLEXERIL) 10 MG tablet Take 1 tablet (10 mg total) by mouth at bedtime as needed for muscle spasms.  Marland Kitchen lisinopril (PRINIVIL,ZESTRIL) 20 MG tablet TAKE 1 TABLET BY MOUTH IN  THE EVENING FOR BLOOD  PRESSURE  . meloxicam (MOBIC) 15 MG tablet TAKE ONE TABLET DAILY WITH  FOOD FOR 2 WEEKS THEN AS  NEEDED DAILY FOR PAIN. CAN  TAKE WITH TYLENOL, CANNOT  TAKE WITH ALEVE/IBUPROFEN.  . metFORMIN (GLUCOPHAGE-XR) 500 MG 24 hr tablet TAKE 1 TABLET BY MOUTH WITH BREAKFAST AND LUNCH AND 2  WITH SUPPER FOR DIABETES  . MILK THISTLE PO Take by mouth 2 (two) times daily.  . Multiple Vitamin (MULTIVITAMIN) tablet Take 1 tablet by mouth daily.  . nortriptyline (PAMELOR) 10 MG capsule 1-2 at bed time for nerve pain.  . ranitidine (ZANTAC) 150 MG tablet Take 1 tablet (150 mg total) by mouth 2 (two) times daily.  . verapamil (CALAN) 120 MG tablet TAKE 1 TABLET BY MOUTH  TWICE A DAY  . vitamin B-12 (CYANOCOBALAMIN) 1000 MCG tablet Take 1,000 mcg by mouth daily.   No current facility-administered medications on file prior to visit.    Allergies  Allergen Reactions  . Diltiazem    Past Medical History:  Diagnosis Date  . DDD (degenerative disc disease), lumbar   . Fatty liver disease, nonalcoholic  CT AB 2012  . Hyperlipidemia   . Hypertension   . Type II or unspecified type diabetes mellitus without mention of complication, not stated as uncontrolled   . Vitamin D deficiency    Health Maintenance  Topic Date Due  . HEMOGLOBIN A1C  02/09/2018  . INFLUENZA VACCINE  02/12/2018  . OPHTHALMOLOGY EXAM  04/17/2018  . FOOT EXAM  11/15/2018  . TETANUS/TDAP  07/09/2021  . DEXA SCAN  Completed  . PNA vac Low Risk Adult  Completed   Immunization History  Administered Date(s) Administered  . H1N1 06/28/2008  . Influenza Whole 05/04/2013  . Influenza, High Dose Seasonal PF 04/26/2014,  04/26/2015, 04/01/2016, 05/07/2017  . Pneumococcal Conjugate-13 04/26/2014  . Pneumococcal Polysaccharide-23 07/10/2011  . Td 07/10/2011  . Zoster 06/28/2008   Last Colon - 08/2003 - Dr Delfin Edis - Diverticulosis   Last MGM - 2003 - Bilat sub-cutaneous ressections  Past Surgical History:  Procedure Laterality Date  . APPENDECTOMY    . KNEE ARTHROSCOPY Left   . MASTECTOMY Bilateral    30 years ago   Family History  Problem Relation Age of Onset  . Stroke Mother   . Heart disease Mother   . Heart disease Father   . Heart attack Father   . Heart attack Sister    Social History   Tobacco Use  . Smoking status: Former Smoker    Last attempt to quit: 07/15/1993    Years since quitting: 24.3  . Smokeless tobacco: Never Used  Substance Use Topics  . Alcohol use: No  . Drug use: No    ROS Constitutional: Denies fever, chills, weight loss/gain, headaches, insomnia,  night sweats, and change in appetite. Does c/o fatigue. Eyes: Denies redness, blurred vision, diplopia, discharge, itchy, watery eyes.  ENT: Denies discharge, congestion, post nasal drip, epistaxis, sore throat, earache, hearing loss, dental pain, Tinnitus, Vertigo, Sinus pain, snoring.  Cardio: Denies chest pain, palpitations, irregular heartbeat, syncope, dyspnea, diaphoresis, orthopnea, PND, claudication, edema Respiratory: denies cough, dyspnea, DOE, pleurisy, hoarseness, laryngitis, wheezing.  Gastrointestinal: Denies dysphagia, heartburn, reflux, water brash, pain, cramps, nausea, vomiting, bloating, diarrhea, constipation, hematemesis, melena, hematochezia, jaundice, hemorrhoids Genitourinary: Denies dysuria, frequency, urgency, nocturia, hesitancy, discharge, hematuria, flank pain Breast: Breast lumps, nipple discharge, bleeding.  Musculoskeletal: Denies arthralgia, myalgia, stiffness, Jt. Swelling, pain, limp, and strain/sprain. Denies falls. Skin: Denies puritis, rash, hives, warts, acne, eczema, changing in  skin lesion Neuro: No weakness, tremor, incoordination, spasms, paresthesia, pain Psychiatric: Denies confusion, memory loss, sensory loss. Denies Depression. Endocrine: Denies change in weight, skin, hair change, nocturia, and paresthesia, diabetic polys, visual blurring, hyper / hypo glycemic episodes.  Heme/Lymph: No excessive bleeding, bruising, enlarged lymph nodes.  Physical Exam  BP 126/76   Pulse 68   Temp (!) 97.1 F (36.2 C)   Resp 16   Ht 5\' 3"  (1.6 m)   Wt 165 lb (74.8 kg)   BMI 29.23 kg/m   General Appearance: Over nourished, well groomed and in no apparent distress.  Eyes: PERRLA, EOMs, conjunctiva no swelling or erythema, normal fundi and vessels. Sinuses: No frontal/maxillary tenderness ENT/Mouth: EACs patent / TMs  nl. Nares clear without erythema, swelling, mucoid exudates. Oral hygiene is good. No erythema, swelling, or exudate. Tongue normal, non-obstructing. Tonsils not swollen or erythematous. Hearing normal.  Neck: Supple, thyroid not palpable. No bruits, nodes or JVD. Respiratory: Respiratory effort normal.  BS equal and clear bilateral without rales, rhonci, wheezing or stridor. Cardio: Heart sounds are normal with regular rate and  rhythm and no murmurs, rubs or gallops. Peripheral pulses are normal and equal bilaterally without edema. No aortic or femoral bruits. Chest: symmetric with normal excursions and percussion. Breasts: Atrophic, dehissed w/o palpable breast tissue. Abdomen: Flat, soft with bowel sounds active. Nontender, no guarding, rebound, hernias, masses, or organomegaly.  Lymphatics: Non tender without lymphadenopathy.  Musculoskeletal: Full ROM all peripheral extremities, joint stability, 5/5 strength, and normal gait. Skin: Warm and dry without rashes, lesions, cyanosis, clubbing or  ecchymosis.  Neuro: Cranial nerves intact, reflexes equal bilaterally. Normal muscle tone, no cerebellar symptoms. Sensation intact to touch, vibratory and  Monofilament to the toes bilaterally. Pysch: Alert and oriented X 3, normal affect, Insight and Judgment appropriate.   Assessment and Plan  1. Essential hypertension  - EKG 12-Lead - Urinalysis, Routine w reflex microscopic - Microalbumin / creatinine urine ratio - CBC with Differential/Platelet - COMPLETE METABOLIC PANEL WITH GFR - Magnesium - TSH  2. Hyperlipidemia, mixed  - EKG 12-Lead - COMPLETE METABOLIC PANEL WITH GFR - Lipid panel - TSH  3. Type 2 diabetes mellitus with stage 2 chronic kidney disease, without long-term current use of insulin (HCC)  - EKG 12-Lead - Urinalysis, Routine w reflex microscopic - Microalbumin / creatinine urine ratio - HM DIABETES FOOT EXAM - LOW EXTREMITY NEUR EXAM DOCUM - Hemoglobin A1c - Insulin, random  4. Vitamin D deficiency  - VITAMIN D 25 Hydroxy   5. Gastroesophageal reflux disease  - CBC with Differential/Platelet  6. Screening for colorectal cancer  - POC Hemoccult Bld/Stl  7. Screening for ischemic heart disease  - EKG 12-Lead  8. Former smoker  - EKG 12-Lead  9. FHx: heart disease  - EKG 12-Lead  10. Medication management  - Urinalysis, Routine w reflex microscopic - Microalbumin / creatinine urine ratio - CBC with Differential/Platelet - COMPLETE METABOLIC PANEL WITH GFR - Magnesium - Lipid panel - TSH - Hemoglobin A1c - Insulin, random - VITAMIN D 25 Hydroxyl           Patient was counseled in prudent diet to achieve/maintain BMI less than 25 for weight control, BP monitoring, regular exercise and medications. Discussed med's effects and SE's. Screening labs and tests as requested with regular follow-up as recommended. Over 40 minutes of exam, counseling, chart review and high complex critical decision making was performed.

## 2017-11-14 ENCOUNTER — Ambulatory Visit (INDEPENDENT_AMBULATORY_CARE_PROVIDER_SITE_OTHER): Payer: Medicare Other | Admitting: Internal Medicine

## 2017-11-14 ENCOUNTER — Encounter: Payer: Self-pay | Admitting: Internal Medicine

## 2017-11-14 VITALS — BP 126/76 | HR 68 | Temp 97.1°F | Resp 16 | Ht 63.0 in | Wt 165.0 lb

## 2017-11-14 DIAGNOSIS — Z136 Encounter for screening for cardiovascular disorders: Secondary | ICD-10-CM

## 2017-11-14 DIAGNOSIS — Z1212 Encounter for screening for malignant neoplasm of rectum: Secondary | ICD-10-CM

## 2017-11-14 DIAGNOSIS — Z79899 Other long term (current) drug therapy: Secondary | ICD-10-CM | POA: Diagnosis not present

## 2017-11-14 DIAGNOSIS — Z8249 Family history of ischemic heart disease and other diseases of the circulatory system: Secondary | ICD-10-CM

## 2017-11-14 DIAGNOSIS — E559 Vitamin D deficiency, unspecified: Secondary | ICD-10-CM | POA: Diagnosis not present

## 2017-11-14 DIAGNOSIS — E782 Mixed hyperlipidemia: Secondary | ICD-10-CM | POA: Diagnosis not present

## 2017-11-14 DIAGNOSIS — Z87891 Personal history of nicotine dependence: Secondary | ICD-10-CM

## 2017-11-14 DIAGNOSIS — K219 Gastro-esophageal reflux disease without esophagitis: Secondary | ICD-10-CM

## 2017-11-14 DIAGNOSIS — E1122 Type 2 diabetes mellitus with diabetic chronic kidney disease: Secondary | ICD-10-CM | POA: Diagnosis not present

## 2017-11-14 DIAGNOSIS — N182 Chronic kidney disease, stage 2 (mild): Secondary | ICD-10-CM

## 2017-11-14 DIAGNOSIS — I10 Essential (primary) hypertension: Secondary | ICD-10-CM | POA: Diagnosis not present

## 2017-11-14 DIAGNOSIS — Z1211 Encounter for screening for malignant neoplasm of colon: Secondary | ICD-10-CM

## 2017-11-14 LAB — URINALYSIS, ROUTINE W REFLEX MICROSCOPIC
BACTERIA UA: NONE SEEN /HPF
BILIRUBIN URINE: NEGATIVE
Glucose, UA: NEGATIVE
Hgb urine dipstick: NEGATIVE
Hyaline Cast: NONE SEEN /LPF
KETONES UR: NEGATIVE
NITRITE: NEGATIVE
Protein, ur: NEGATIVE
RBC / HPF: NONE SEEN /HPF (ref 0–2)
SPECIFIC GRAVITY, URINE: 1.019 (ref 1.001–1.03)
Squamous Epithelial / LPF: NONE SEEN /HPF (ref <=5)
WBC, UA: NONE SEEN /HPF (ref 0–5)

## 2017-11-14 MED ORDER — RANITIDINE HCL 300 MG PO TABS: ORAL_TABLET | ORAL | 3 refills | Status: DC

## 2017-11-14 MED ORDER — OMEPRAZOLE 40 MG PO CPDR: DELAYED_RELEASE_CAPSULE | ORAL | 3 refills | Status: DC

## 2017-11-14 NOTE — Patient Instructions (Signed)

## 2017-11-15 LAB — MICROALBUMIN / CREATININE URINE RATIO
Creatinine, Urine: 107 mg/dL (ref 20–275)
Microalb Creat Ratio: 10 mcg/mg creat (ref <30)
Microalb, Ur: 1.1 mg/dL

## 2017-11-17 LAB — COMPLETE METABOLIC PANEL WITH GFR
AG RATIO: 2.1 (calc) (ref 1.0–2.5)
ALBUMIN MSPROF: 4.5 g/dL (ref 3.6–5.1)
ALT: 33 U/L — ABNORMAL HIGH (ref 6–29)
AST: 27 U/L (ref 10–35)
Alkaline phosphatase (APISO): 46 U/L (ref 33–130)
BILIRUBIN TOTAL: 1 mg/dL (ref 0.2–1.2)
BUN / CREAT RATIO: 33 (calc) — AB (ref 6–22)
BUN: 26 mg/dL — ABNORMAL HIGH (ref 7–25)
CHLORIDE: 104 mmol/L (ref 98–110)
CO2: 30 mmol/L (ref 20–32)
Calcium: 10 mg/dL (ref 8.6–10.4)
Creat: 0.8 mg/dL (ref 0.60–0.93)
GFR, EST NON AFRICAN AMERICAN: 70 mL/min/{1.73_m2} (ref >=60)
GFR, Est African American: 81 mL/min/{1.73_m2} (ref >=60)
GLOBULIN: 2.1 g/dL (ref 1.9–3.7)
Glucose, Bld: 94 mg/dL (ref 65–99)
POTASSIUM: 5.6 mmol/L — AB (ref 3.5–5.3)
SODIUM: 139 mmol/L (ref 135–146)
TOTAL PROTEIN: 6.6 g/dL (ref 6.1–8.1)

## 2017-11-17 LAB — CBC WITH DIFFERENTIAL/PLATELET
BASOS PCT: 0.4 %
Basophils Absolute: 28 cells/uL (ref 0–200)
EOS ABS: 133 {cells}/uL (ref 15–500)
Eosinophils Relative: 1.9 %
HEMATOCRIT: 40.4 % (ref 35.0–45.0)
Hemoglobin: 13.8 g/dL (ref 11.7–15.5)
LYMPHS ABS: 1897 {cells}/uL (ref 850–3900)
MCH: 31.6 pg (ref 27.0–33.0)
MCHC: 34.2 g/dL (ref 32.0–36.0)
MCV: 92.4 fL (ref 80.0–100.0)
MPV: 11.3 fL (ref 7.5–12.5)
Monocytes Relative: 11.1 %
Neutro Abs: 4165 cells/uL (ref 1500–7800)
Neutrophils Relative %: 59.5 %
PLATELETS: 188 10*3/uL (ref 140–400)
RBC: 4.37 10*6/uL (ref 3.80–5.10)
RDW: 14.2 % (ref 11.0–15.0)
TOTAL LYMPHOCYTE: 27.1 %
WBC: 7 10*3/uL (ref 3.8–10.8)
WBCMIX: 777 {cells}/uL (ref 200–950)

## 2017-11-17 LAB — INSULIN, RANDOM: Insulin: 26.8 u[IU]/mL — ABNORMAL HIGH (ref 2.0–19.6)

## 2017-11-17 LAB — HEMOGLOBIN A1C
Hgb A1c MFr Bld: 5.9 % of total Hgb — ABNORMAL HIGH (ref <5.7)
Mean Plasma Glucose: 123 (calc)
eAG (mmol/L): 6.8 (calc)

## 2017-11-17 LAB — LIPID PANEL
CHOL/HDL RATIO: 4.3 (calc) (ref <5.0)
Cholesterol: 153 mg/dL (ref <200)
HDL: 36 mg/dL — ABNORMAL LOW (ref >50)
LDL Cholesterol (Calc): 82 mg/dL (calc)
Non-HDL Cholesterol (Calc): 117 mg/dL (calc) (ref <130)
Triglycerides: 268 mg/dL — ABNORMAL HIGH (ref <150)

## 2017-11-17 LAB — MAGNESIUM: Magnesium: 1.8 mg/dL (ref 1.5–2.5)

## 2017-11-17 LAB — TSH: TSH: 3.31 m[IU]/L (ref 0.40–4.50)

## 2017-11-17 LAB — VITAMIN D 25 HYDROXY (VIT D DEFICIENCY, FRACTURES): VIT D 25 HYDROXY: 41 ng/mL (ref 30–100)

## 2017-11-18 DIAGNOSIS — J3089 Other allergic rhinitis: Secondary | ICD-10-CM | POA: Diagnosis not present

## 2017-11-18 DIAGNOSIS — J301 Allergic rhinitis due to pollen: Secondary | ICD-10-CM | POA: Diagnosis not present

## 2017-11-18 DIAGNOSIS — J3081 Allergic rhinitis due to animal (cat) (dog) hair and dander: Secondary | ICD-10-CM | POA: Diagnosis not present

## 2017-11-25 DIAGNOSIS — J3081 Allergic rhinitis due to animal (cat) (dog) hair and dander: Secondary | ICD-10-CM | POA: Diagnosis not present

## 2017-11-25 DIAGNOSIS — J301 Allergic rhinitis due to pollen: Secondary | ICD-10-CM | POA: Diagnosis not present

## 2017-11-28 DIAGNOSIS — J3081 Allergic rhinitis due to animal (cat) (dog) hair and dander: Secondary | ICD-10-CM | POA: Diagnosis not present

## 2017-12-02 DIAGNOSIS — J301 Allergic rhinitis due to pollen: Secondary | ICD-10-CM | POA: Diagnosis not present

## 2017-12-02 DIAGNOSIS — J3081 Allergic rhinitis due to animal (cat) (dog) hair and dander: Secondary | ICD-10-CM | POA: Diagnosis not present

## 2017-12-09 DIAGNOSIS — J301 Allergic rhinitis due to pollen: Secondary | ICD-10-CM | POA: Diagnosis not present

## 2017-12-09 DIAGNOSIS — J3081 Allergic rhinitis due to animal (cat) (dog) hair and dander: Secondary | ICD-10-CM | POA: Diagnosis not present

## 2017-12-17 DIAGNOSIS — J3081 Allergic rhinitis due to animal (cat) (dog) hair and dander: Secondary | ICD-10-CM | POA: Diagnosis not present

## 2017-12-17 DIAGNOSIS — J301 Allergic rhinitis due to pollen: Secondary | ICD-10-CM | POA: Diagnosis not present

## 2017-12-23 ENCOUNTER — Other Ambulatory Visit: Payer: Self-pay | Admitting: Adult Health

## 2017-12-23 ENCOUNTER — Other Ambulatory Visit: Payer: Self-pay | Admitting: Internal Medicine

## 2017-12-23 ENCOUNTER — Other Ambulatory Visit: Payer: Self-pay

## 2017-12-23 ENCOUNTER — Other Ambulatory Visit: Payer: Self-pay | Admitting: Physician Assistant

## 2017-12-23 ENCOUNTER — Encounter: Payer: Self-pay | Admitting: Internal Medicine

## 2017-12-23 DIAGNOSIS — Z1212 Encounter for screening for malignant neoplasm of rectum: Principal | ICD-10-CM

## 2017-12-23 DIAGNOSIS — Z1211 Encounter for screening for malignant neoplasm of colon: Secondary | ICD-10-CM

## 2017-12-23 DIAGNOSIS — J301 Allergic rhinitis due to pollen: Secondary | ICD-10-CM | POA: Diagnosis not present

## 2017-12-23 DIAGNOSIS — J3081 Allergic rhinitis due to animal (cat) (dog) hair and dander: Secondary | ICD-10-CM | POA: Diagnosis not present

## 2017-12-23 LAB — POC HEMOCCULT BLD/STL (HOME/3-CARD/SCREEN)
Card #2 Fecal Occult Blod, POC: NEGATIVE
Card #3 Fecal Occult Blood, POC: NEGATIVE
FECAL OCCULT BLD: NEGATIVE

## 2017-12-23 MED ORDER — ATORVASTATIN CALCIUM 80 MG PO TABS: ORAL_TABLET | ORAL | 1 refills | Status: DC

## 2017-12-23 MED ORDER — ATORVASTATIN CALCIUM 80 MG PO TABS
ORAL_TABLET | ORAL | 1 refills | Status: DC
Start: 2017-12-23 — End: 2017-12-23

## 2017-12-30 DIAGNOSIS — J301 Allergic rhinitis due to pollen: Secondary | ICD-10-CM | POA: Diagnosis not present

## 2017-12-30 DIAGNOSIS — J3081 Allergic rhinitis due to animal (cat) (dog) hair and dander: Secondary | ICD-10-CM | POA: Diagnosis not present

## 2018-01-06 DIAGNOSIS — J301 Allergic rhinitis due to pollen: Secondary | ICD-10-CM | POA: Diagnosis not present

## 2018-01-06 DIAGNOSIS — J3081 Allergic rhinitis due to animal (cat) (dog) hair and dander: Secondary | ICD-10-CM | POA: Diagnosis not present

## 2018-01-14 DIAGNOSIS — J301 Allergic rhinitis due to pollen: Secondary | ICD-10-CM | POA: Diagnosis not present

## 2018-01-14 DIAGNOSIS — J3081 Allergic rhinitis due to animal (cat) (dog) hair and dander: Secondary | ICD-10-CM | POA: Diagnosis not present

## 2018-01-26 ENCOUNTER — Other Ambulatory Visit: Payer: Self-pay | Admitting: Internal Medicine

## 2018-01-27 DIAGNOSIS — J3081 Allergic rhinitis due to animal (cat) (dog) hair and dander: Secondary | ICD-10-CM | POA: Diagnosis not present

## 2018-01-27 DIAGNOSIS — J301 Allergic rhinitis due to pollen: Secondary | ICD-10-CM | POA: Diagnosis not present

## 2018-02-05 DIAGNOSIS — J3081 Allergic rhinitis due to animal (cat) (dog) hair and dander: Secondary | ICD-10-CM | POA: Diagnosis not present

## 2018-02-05 DIAGNOSIS — J301 Allergic rhinitis due to pollen: Secondary | ICD-10-CM | POA: Diagnosis not present

## 2018-02-11 DIAGNOSIS — J301 Allergic rhinitis due to pollen: Secondary | ICD-10-CM | POA: Diagnosis not present

## 2018-02-11 DIAGNOSIS — J3081 Allergic rhinitis due to animal (cat) (dog) hair and dander: Secondary | ICD-10-CM | POA: Diagnosis not present

## 2018-02-17 DIAGNOSIS — J301 Allergic rhinitis due to pollen: Secondary | ICD-10-CM | POA: Diagnosis not present

## 2018-02-17 DIAGNOSIS — J3081 Allergic rhinitis due to animal (cat) (dog) hair and dander: Secondary | ICD-10-CM | POA: Diagnosis not present

## 2018-02-17 DIAGNOSIS — Z961 Presence of intraocular lens: Secondary | ICD-10-CM | POA: Diagnosis not present

## 2018-02-17 LAB — HM DIABETES EYE EXAM

## 2018-02-26 DIAGNOSIS — J301 Allergic rhinitis due to pollen: Secondary | ICD-10-CM | POA: Diagnosis not present

## 2018-02-26 DIAGNOSIS — J3081 Allergic rhinitis due to animal (cat) (dog) hair and dander: Secondary | ICD-10-CM | POA: Diagnosis not present

## 2018-02-27 DIAGNOSIS — J3081 Allergic rhinitis due to animal (cat) (dog) hair and dander: Secondary | ICD-10-CM | POA: Diagnosis not present

## 2018-02-27 DIAGNOSIS — J301 Allergic rhinitis due to pollen: Secondary | ICD-10-CM | POA: Diagnosis not present

## 2018-03-11 DIAGNOSIS — J3081 Allergic rhinitis due to animal (cat) (dog) hair and dander: Secondary | ICD-10-CM | POA: Diagnosis not present

## 2018-03-11 DIAGNOSIS — J301 Allergic rhinitis due to pollen: Secondary | ICD-10-CM | POA: Diagnosis not present

## 2018-03-11 DIAGNOSIS — K219 Gastro-esophageal reflux disease without esophagitis: Secondary | ICD-10-CM | POA: Insufficient documentation

## 2018-03-11 NOTE — Progress Notes (Signed)
MEDICARE ANNUAL WELLNESS VISIT AND 3  MONTH FOLLOW UP  Assessment:   Medicare annual wellness visit  Essential hypertension - continue medications, DASH diet, exercise and monitor at home. Call if greater than 130/80.  -     CBC with Differential/Platelet -     CMP/GFR -     TSH  DM with CKD Discussed general issues about diabetes pathophysiology and management., Educational material distributed., Suggested low cholesterol diet., Encouraged aerobic exercise., Discussed foot care., Reminded to get yearly retinal exam. -     Hemoglobin A1c  Mixed hyperlipidemia -continue medications, check lipids, decrease fatty foods, increase activity.  -     Lipid panel  Vitamin D deficiency Continue supplement Defer check to next visit -  Medication management -     Magnesium  Chronic obstructive pulmonary disease, unspecified COPD type ([place]) Avoid triggers, no symptoms.   GERD Well managed on current medications Discussed diet, avoiding triggers and other lifestyle changes  Overweight Long discussion about weight loss, diet, and exercise Recommended diet heavy in fruits and veggies and low in animal meats, cheeses, and dairy products, appropriate calorie intake Discussed appropriate weight for height and initial goal (160 lb) Follow up at next visit  Unsteady gait Residual LLE weakness after back surgery PT referral entered  Over 40 minutes of exam, counseling, chart review and critical decision making was performed Future Appointments  Date Time Provider [place]  06/17/2018  9:30 AM Unk Pinto, MD [facility] None  12/15/2018 10:00 AM Unk Pinto, MD [facility] None     Plan:   During the course of the visit the patient was educated and counseled about appropriate screening and preventive services including:    Pneumococcal vaccine   Prevnar 13  Influenza vaccine  Td vaccine  Screening electrocardiogram  Bone densitometry  screening  Colorectal cancer screening  Diabetes screening  Glaucoma screening  Nutrition counseling   Advanced directives: requested   Subjective:  Debra Collins is a 82 y.o. female who presents for Medicare Annual Wellness Visit and 3 month follow up. Patient's GERD is controlled with her meds.  BMI is Body mass index is 29.05 kg/m., she has not been working on diet and exercise, admits was at beach and had birthday, needs to be cooking more.  Wt Readings from Last 3 Encounters:  03/12/18 164 lb (74.4 kg)  11/14/17 165 lb (74.8 kg)  08/12/17 166 lb (75.3 kg)   Her blood pressure has been controlled at home, today their BP is BP: (!) 156/88 Patient admits to not taking BP medication this AM.   She does not workout. She denies chest pain, shortness of breath, dizziness.   She is on cholesterol medication (atorvastatin 40 mg MWF) and denies myalgias. Her cholesterol is not at goal. The cholesterol last visit was:    Lab Results  Component Value Date   CHOL 153 11/14/2017   HDL 36 (L) 11/14/2017   LDLCALC 82 11/14/2017   TRIG 268 (H) 11/14/2017   CHOLHDL 4.3 11/14/2017    She has been working on diet and exercise for diabetes with CKD (on metformin 2000 mg daily), she is on bASA, she is on ACE, and denies paresthesia of the feet, polydipsia and polyuria. Last A1C in the office was:  Lab Results  Component Value Date   HGBA1C 5.9 (H) 11/14/2017   Lab Results  Component Value Date   GFRNONAA 70 11/14/2017   Patient is on Vitamin D supplement - 5000 units daily, but forgets  to take   Lab Results  Component Value Date   VD25OH 41 11/14/2017        Medication Review: Current Outpatient Medications on File Prior to Visit  Medication Sig Dispense Refill  . aspirin 81 MG tablet Take 81 mg by mouth daily.    Marland Kitchen atenolol (TENORMIN) 100 MG tablet TAKE 1 TABLET BY MOUTH  DAILY FOR BLOOD PRESSURE 90 tablet 0  . atorvastatin (LIPITOR) 80 MG tablet Take 1/2 to 1  tablet daily  as directed for cholesterol 90 tablet 1  . Calcium Citrate (CALCITRATE PO) Take 500 mg by mouth daily.    . Cholecalciferol (VITAMIN D PO) Take 5,000 Units by mouth 2 (two) times daily.     . cyclobenzaprine (FLEXERIL) 10 MG tablet Take 1 tablet (10 mg total) by mouth at bedtime as needed for muscle spasms. 30 tablet 0  . lisinopril (PRINIVIL,ZESTRIL) 20 MG tablet TAKE 1 TABLET BY MOUTH IN  THE EVENING FOR BLOOD  PRESSURE 90 tablet 3  . meloxicam (MOBIC) 15 MG tablet TAKE ONE TABLET BY MOUTH  DAILY WITH FOOD FOR 2 WEEKS THEN AS NEEDED DAILY FOR  PAIN. CAN TAKE WITH  TYLENOL, CANNOT TAKE WITH  ALEVE/IBUPRO 60 tablet 0  . metFORMIN (GLUCOPHAGE-XR) 500 MG 24 hr tablet TAKE 1 TABLET BY MOUTH WITH BREAKFAST AND LUNCH AND 2  WITH SUPPER FOR DIABETES 360 tablet 1  . MILK THISTLE PO Take by mouth 2 (two) times daily.    . Multiple Vitamin (MULTIVITAMIN) tablet Take 1 tablet by mouth daily.    . nortriptyline (PAMELOR) 10 MG capsule 1-2 at bed time for nerve pain. 60 capsule 0  . omeprazole (PRILOSEC) 40 MG capsule Take 1 capsule every morning for Acid Reflux 90 capsule 3  . ranitidine (ZANTAC) 300 MG tablet Take 1 tablet 2 x / day for Acid Reflux 180 tablet 3  . verapamil (CALAN) 120 MG tablet TAKE 1 TABLET BY MOUTH  TWICE A DAY 180 tablet 0  . vitamin B-12 (CYANOCOBALAMIN) 1000 MCG tablet Take 1,000 mcg by mouth daily.     No current facility-administered medications on file prior to visit.     Current Problems (verified) Patient Active Problem List   Diagnosis Date Noted  . GERD (gastroesophageal reflux disease) 03/11/2018  . Overweight (BMI 25.0-29.9) 08/11/2017  . Chronic obstructive pulmonary disease ([place]) 01/29/2017  . Encounter for Medicare annual wellness exam 03/09/2015  . 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 Medication management 01/23/2014  . Essential hypertension 07/25/2013  . Mixed hyperlipidemia 07/25/2013  . Vitamin D deficiency 07/25/2013     Screening Tests Immunization History  Administered Date(s) Administered  . H1N1 06/28/2008  . Influenza Whole 05/04/2013  . Influenza, High Dose Seasonal PF 04/26/2014, 04/26/2015, 04/01/2016, 05/07/2017  . Pneumococcal Conjugate-13 04/26/2014  . Pneumococcal Polysaccharide-23 07/10/2011  . Td 07/10/2011  . Zoster 06/28/2008   Preventative care: Last colonoscopy: 2005 declines another Last mammogram: bilateral mastectomy DEXA: 01/2016 - normal US transvaginal 08/2016  Prior vaccinations: TD or Tdap: 2012  Influenza: 2018 Pneumococcal: 2012 Prevnar13: 2015 Shingles/Zostavax: 2009  Names of Other Physician/Practitioners you currently use: 1. [facility] here for primary care 2. Dr. Gershon Crane, eye doctor, last visit 02/2018 - report requested 3., Dr. Lavone Neri dentist, last visit 09/2017, goes q52m  Patient Care Team: Unk Pinto, MD as PCP - General (Internal Medicine) Melissa Noon, OD (Optometry)  Allergies Allergies  Allergen Reactions  . Diltiazem  SURGICAL HISTORY She  has a past surgical history that includes Knee arthroscopy (Left); Appendectomy; Mastectomy (Bilateral); and Cataract extraction, bilateral (Bilateral, 09/2017). FAMILY HISTORY Her family history includes Heart attack in her father and sister; Heart disease in her father and mother; Stroke in her mother. SOCIAL HISTORY She  reports that she quit smoking about 24 years ago. She has never used smokeless tobacco. She reports that she does not drink alcohol or use drugs.  MEDICARE WELLNESS OBJECTIVES: Physical activity: Current Exercise Habits: The patient does not participate in regular exercise at present, Exercise limited by: neurologic condition(s) Cardiac risk factors: Cardiac Risk Factors include: dyslipidemia;diabetes mellitus;hypertension;sedentary lifestyle;advanced age (>3men, >20 women);smoking/ tobacco exposure Depression/mood screen:   Depression  screen [facility] 2/9 03/12/2018  Decreased Interest 0  Down, Depressed, Hopeless 0  PHQ - 2 Score 0    ADLs:  In your present state of health, do you have any difficulty performing the following activities: 03/12/2018 11/14/2017  Hearing? N N  Vision? N N  Difficulty concentrating or making decisions? N N  Walking or climbing stairs? Y N  Comment residual weakness of left leg after back surgery -  Dressing or bathing? N N  Doing errands, shopping? N N  Some recent data might be hidden     Cognitive Testing  Alert? Yes  Normal Appearance?Yes  Oriented to person? Yes  Place? Yes   Time? Yes  Recall of three objects?  Yes  Can perform simple calculations? Yes  Displays appropriate judgment?Yes  Can read the correct time from a watch face?Yes  EOL planning: Does Patient Have a Medical Advance Directive?: Yes Type of Advance Directive: Healthcare Power of Attorney, Living will Does patient want to make changes to medical advance directive?: No - Patient declined Copy of [place] in Chart?: No - copy requested  Review of Systems  Constitutional: Negative for chills, fever, malaise/fatigue and weight loss.  HENT: Negative for congestion, ear pain, hearing loss, sore throat and tinnitus.   Eyes: Negative.  Negative for blurred vision and double vision.  Respiratory: Negative for cough, sputum production, shortness of breath and wheezing.   Cardiovascular: Negative for chest pain, palpitations, orthopnea, claudication, leg swelling and PND.  Gastrointestinal: Negative for abdominal pain, blood in stool, constipation, diarrhea, heartburn, melena, nausea and vomiting.  Genitourinary: Negative.   Musculoskeletal: Negative for falls, joint pain and myalgias.  Skin: Negative.  Negative for rash.  Neurological: Negative for dizziness, tingling, sensory change, loss of consciousness, weakness and headaches.  Endo/Heme/Allergies: Negative for polydipsia.  Psychiatric/Behavioral:  Negative.  Negative for depression, memory loss, substance abuse and suicidal ideas. The patient is not nervous/anxious and does not have insomnia.   All other systems reviewed and are negative.    Objective:     Today's Vitals   03/12/18 0857  BP: (!) 156/88  Pulse: 62  Temp: (!) 97.5 F (36.4 C)  SpO2: 97%  Weight: 164 lb (74.4 kg)  Height: 5\' 3"  (1.6 m)  PainSc: 8   PainLoc: Leg   Body mass index is 29.05 kg/m.  General appearance: alert, no distress, WD/WN, female HEENT: normocephalic, sclerae anicteric, TMs pearly, nares patent, no discharge or erythema, pharynx normal Debra cavity: MMM, no lesions Neck: supple, no lymphadenopathy, no thyromegaly, no masses Heart: RRR, normal S1, S2, no murmurs Lungs: CTA bilaterally, no wheezes, rhonchi, or rales Abdomen: +bs, soft, non tender, non distended, no masses, no hepatomegaly, no splenomegaly Musculoskeletal: nontender, no swelling, no obvious deformity Extremities: no edema,  no cyanosis, no clubbing Pulses: 2+ symmetric, upper and lower extremities, normal cap refill Neurological: alert, oriented x 3, CN2-12 intact, strength normal upper extremities, LLE mildly weak, sensation normal throughout, DTRs 2+ throughout, no cerebellar signs, gait slow Psychiatric: normal affect, behavior normal, pleasant   Medicare Attestation I have personally reviewed: The patient's medical and social history Their use of alcohol, tobacco or illicit drugs Their current medications and supplements The patient's functional ability including ADLs,fall risks, home safety risks, cognitive, and hearing and visual impairment Diet and physical activities Evidence for depression or mood disorders  The patient's weight, height, BMI, and visual acuity have been recorded in the chart.  I have made referrals, counseling, and provided education to the patient based on review of the above and I have provided the patient with a written personalized care plan  for preventive services.     Izora Ribas, NP   03/12/2018

## 2018-03-12 ENCOUNTER — Encounter: Payer: Self-pay | Admitting: Adult Health

## 2018-03-12 ENCOUNTER — Ambulatory Visit (INDEPENDENT_AMBULATORY_CARE_PROVIDER_SITE_OTHER): Payer: Medicare Other | Admitting: Adult Health

## 2018-03-12 VITALS — BP 156/88 | HR 62 | Temp 97.5°F | Ht 63.0 in | Wt 164.0 lb

## 2018-03-12 DIAGNOSIS — R2681 Unsteadiness on feet: Secondary | ICD-10-CM | POA: Diagnosis not present

## 2018-03-12 DIAGNOSIS — E782 Mixed hyperlipidemia: Secondary | ICD-10-CM | POA: Diagnosis not present

## 2018-03-12 DIAGNOSIS — Z79899 Other long term (current) drug therapy: Secondary | ICD-10-CM

## 2018-03-12 DIAGNOSIS — E559 Vitamin D deficiency, unspecified: Secondary | ICD-10-CM | POA: Diagnosis not present

## 2018-03-12 DIAGNOSIS — E663 Overweight: Secondary | ICD-10-CM | POA: Diagnosis not present

## 2018-03-12 DIAGNOSIS — R6889 Other general symptoms and signs: Secondary | ICD-10-CM | POA: Diagnosis not present

## 2018-03-12 DIAGNOSIS — J449 Chronic obstructive pulmonary disease, unspecified: Secondary | ICD-10-CM

## 2018-03-12 DIAGNOSIS — K219 Gastro-esophageal reflux disease without esophagitis: Secondary | ICD-10-CM

## 2018-03-12 DIAGNOSIS — E1121 Type 2 diabetes mellitus with diabetic nephropathy: Secondary | ICD-10-CM | POA: Diagnosis not present

## 2018-03-12 DIAGNOSIS — Z0001 Encounter for general adult medical examination with abnormal findings: Secondary | ICD-10-CM

## 2018-03-12 DIAGNOSIS — I10 Essential (primary) hypertension: Secondary | ICD-10-CM

## 2018-03-12 DIAGNOSIS — Z0000 Encounter for general adult medical examination without abnormal findings: Secondary | ICD-10-CM

## 2018-03-12 NOTE — Patient Instructions (Signed)
  Debra Collins , Thank you for taking time to come for your Medicare Wellness Visit. I appreciate your ongoing commitment to your health goals. Please review the following plan we discussed and let me know if I can assist you in the future.   These are the goals we discussed: Goals    . Blood Pressure < 140/90    . LDL CALC < 70    . Weight (lb) < 160 lb (72.6 kg)       This is a list of the screening recommended for you and due dates:  Health Maintenance  Topic Date Due  . Flu Shot  04/14/2018*  . Eye exam for diabetics  04/17/2018  . Hemoglobin A1C  05/17/2018  . Complete foot exam   11/15/2018  . Tetanus Vaccine  07/09/2021  . DEXA scan (bone density measurement)  Completed  . Pneumonia vaccines  Completed  *Topic was postponed. The date shown is not the original due date.

## 2018-03-13 LAB — CBC WITH DIFFERENTIAL/PLATELET
BASOS PCT: 0.4 %
Basophils Absolute: 21 cells/uL (ref 0–200)
EOS PCT: 3.6 %
Eosinophils Absolute: 191 cells/uL (ref 15–500)
HCT: 42.2 % (ref 35.0–45.0)
Hemoglobin: 14.2 g/dL (ref 11.7–15.5)
Lymphs Abs: 1579 cells/uL (ref 850–3900)
MCH: 33.1 pg — ABNORMAL HIGH (ref 27.0–33.0)
MCHC: 33.6 g/dL (ref 32.0–36.0)
MCV: 98.4 fL (ref 80.0–100.0)
MPV: 11.1 fL (ref 7.5–12.5)
Monocytes Relative: 11.8 %
NEUTROS PCT: 54.4 %
Neutro Abs: 2883 cells/uL (ref 1500–7800)
Platelets: 176 10*3/uL (ref 140–400)
RBC: 4.29 10*6/uL (ref 3.80–5.10)
RDW: 13 % (ref 11.0–15.0)
Total Lymphocyte: 29.8 %
WBC mixed population: 625 cells/uL (ref 200–950)
WBC: 5.3 10*3/uL (ref 3.8–10.8)

## 2018-03-13 LAB — COMPLETE METABOLIC PANEL WITH GFR
AG RATIO: 2.5 (calc) (ref 1.0–2.5)
ALT: 44 U/L — ABNORMAL HIGH (ref 6–29)
AST: 28 U/L (ref 10–35)
Albumin: 4.5 g/dL (ref 3.6–5.1)
Alkaline phosphatase (APISO): 45 U/L (ref 33–130)
BUN: 19 mg/dL (ref 7–25)
CALCIUM: 9.3 mg/dL (ref 8.6–10.4)
CO2: 30 mmol/L (ref 20–32)
CREATININE: 0.68 mg/dL (ref 0.60–0.88)
Chloride: 105 mmol/L (ref 98–110)
GFR, EST NON AFRICAN AMERICAN: 83 mL/min/{1.73_m2} (ref >=60)
GFR, Est African American: 96 mL/min/{1.73_m2} (ref >=60)
Globulin: 1.8 g/dL (calc) — ABNORMAL LOW (ref 1.9–3.7)
Glucose, Bld: 121 mg/dL — ABNORMAL HIGH (ref 65–99)
POTASSIUM: 4.3 mmol/L (ref 3.5–5.3)
Sodium: 142 mmol/L (ref 135–146)
Total Bilirubin: 0.8 mg/dL (ref 0.2–1.2)
Total Protein: 6.3 g/dL (ref 6.1–8.1)

## 2018-03-13 LAB — TSH: TSH: 2.78 mIU/L (ref 0.40–4.50)

## 2018-03-13 LAB — HEMOGLOBIN A1C
EAG (MMOL/L): 7.1 (calc)
Hgb A1c MFr Bld: 6.1 % of total Hgb — ABNORMAL HIGH (ref <5.7)
Mean Plasma Glucose: 128 (calc)

## 2018-03-13 LAB — MAGNESIUM: Magnesium: 1.8 mg/dL (ref 1.5–2.5)

## 2018-03-13 LAB — LIPID PANEL
CHOL/HDL RATIO: 4.3 (calc) (ref <5.0)
CHOLESTEROL: 149 mg/dL (ref <200)
HDL: 35 mg/dL — ABNORMAL LOW (ref >50)
LDL Cholesterol (Calc): 86 mg/dL (calc)
Non-HDL Cholesterol (Calc): 114 mg/dL (calc) (ref <130)
Triglycerides: 189 mg/dL — ABNORMAL HIGH (ref <150)

## 2018-03-16 ENCOUNTER — Other Ambulatory Visit: Payer: Self-pay | Admitting: Physician Assistant

## 2018-03-16 ENCOUNTER — Other Ambulatory Visit: Payer: Self-pay | Admitting: Internal Medicine

## 2018-03-17 DIAGNOSIS — J301 Allergic rhinitis due to pollen: Secondary | ICD-10-CM | POA: Diagnosis not present

## 2018-03-17 DIAGNOSIS — J3081 Allergic rhinitis due to animal (cat) (dog) hair and dander: Secondary | ICD-10-CM | POA: Diagnosis not present

## 2018-03-25 DIAGNOSIS — J3081 Allergic rhinitis due to animal (cat) (dog) hair and dander: Secondary | ICD-10-CM | POA: Diagnosis not present

## 2018-03-25 DIAGNOSIS — J301 Allergic rhinitis due to pollen: Secondary | ICD-10-CM | POA: Diagnosis not present

## 2018-04-02 DIAGNOSIS — J3089 Other allergic rhinitis: Secondary | ICD-10-CM | POA: Diagnosis not present

## 2018-04-02 DIAGNOSIS — J301 Allergic rhinitis due to pollen: Secondary | ICD-10-CM | POA: Diagnosis not present

## 2018-04-12 ENCOUNTER — Other Ambulatory Visit: Payer: Self-pay | Admitting: Physician Assistant

## 2018-04-15 DIAGNOSIS — J301 Allergic rhinitis due to pollen: Secondary | ICD-10-CM | POA: Diagnosis not present

## 2018-04-15 DIAGNOSIS — J3081 Allergic rhinitis due to animal (cat) (dog) hair and dander: Secondary | ICD-10-CM | POA: Diagnosis not present

## 2018-04-20 DIAGNOSIS — Z23 Encounter for immunization: Secondary | ICD-10-CM | POA: Diagnosis not present

## 2018-04-21 ENCOUNTER — Other Ambulatory Visit: Payer: Self-pay | Admitting: *Deleted

## 2018-04-24 DIAGNOSIS — J301 Allergic rhinitis due to pollen: Secondary | ICD-10-CM | POA: Diagnosis not present

## 2018-04-24 DIAGNOSIS — J3081 Allergic rhinitis due to animal (cat) (dog) hair and dander: Secondary | ICD-10-CM | POA: Diagnosis not present

## 2018-05-01 DIAGNOSIS — J3081 Allergic rhinitis due to animal (cat) (dog) hair and dander: Secondary | ICD-10-CM | POA: Diagnosis not present

## 2018-05-01 DIAGNOSIS — J301 Allergic rhinitis due to pollen: Secondary | ICD-10-CM | POA: Diagnosis not present

## 2018-05-13 DIAGNOSIS — J301 Allergic rhinitis due to pollen: Secondary | ICD-10-CM | POA: Diagnosis not present

## 2018-05-13 DIAGNOSIS — J3081 Allergic rhinitis due to animal (cat) (dog) hair and dander: Secondary | ICD-10-CM | POA: Diagnosis not present

## 2018-05-22 DIAGNOSIS — J301 Allergic rhinitis due to pollen: Secondary | ICD-10-CM | POA: Diagnosis not present

## 2018-05-22 DIAGNOSIS — J3081 Allergic rhinitis due to animal (cat) (dog) hair and dander: Secondary | ICD-10-CM | POA: Diagnosis not present

## 2018-05-25 DIAGNOSIS — J301 Allergic rhinitis due to pollen: Secondary | ICD-10-CM | POA: Diagnosis not present

## 2018-05-25 DIAGNOSIS — J3081 Allergic rhinitis due to animal (cat) (dog) hair and dander: Secondary | ICD-10-CM | POA: Diagnosis not present

## 2018-05-29 DIAGNOSIS — J3081 Allergic rhinitis due to animal (cat) (dog) hair and dander: Secondary | ICD-10-CM | POA: Diagnosis not present

## 2018-05-29 DIAGNOSIS — J301 Allergic rhinitis due to pollen: Secondary | ICD-10-CM | POA: Diagnosis not present

## 2018-06-04 DIAGNOSIS — J301 Allergic rhinitis due to pollen: Secondary | ICD-10-CM | POA: Diagnosis not present

## 2018-06-04 DIAGNOSIS — J3081 Allergic rhinitis due to animal (cat) (dog) hair and dander: Secondary | ICD-10-CM | POA: Diagnosis not present

## 2018-06-10 DIAGNOSIS — J301 Allergic rhinitis due to pollen: Secondary | ICD-10-CM | POA: Diagnosis not present

## 2018-06-10 DIAGNOSIS — J3081 Allergic rhinitis due to animal (cat) (dog) hair and dander: Secondary | ICD-10-CM | POA: Diagnosis not present

## 2018-06-17 ENCOUNTER — Ambulatory Visit (INDEPENDENT_AMBULATORY_CARE_PROVIDER_SITE_OTHER): Payer: Medicare Other | Admitting: Internal Medicine

## 2018-06-17 ENCOUNTER — Encounter: Payer: Self-pay | Admitting: Internal Medicine

## 2018-06-17 VITALS — BP 138/78 | HR 56 | Temp 97.3°F | Resp 16 | Ht 63.0 in | Wt 168.2 lb

## 2018-06-17 DIAGNOSIS — E559 Vitamin D deficiency, unspecified: Secondary | ICD-10-CM | POA: Diagnosis not present

## 2018-06-17 DIAGNOSIS — K219 Gastro-esophageal reflux disease without esophagitis: Secondary | ICD-10-CM | POA: Diagnosis not present

## 2018-06-17 DIAGNOSIS — E1122 Type 2 diabetes mellitus with diabetic chronic kidney disease: Secondary | ICD-10-CM | POA: Diagnosis not present

## 2018-06-17 DIAGNOSIS — N182 Chronic kidney disease, stage 2 (mild): Secondary | ICD-10-CM

## 2018-06-17 DIAGNOSIS — E6609 Other obesity due to excess calories: Secondary | ICD-10-CM

## 2018-06-17 DIAGNOSIS — I10 Essential (primary) hypertension: Secondary | ICD-10-CM | POA: Diagnosis not present

## 2018-06-17 DIAGNOSIS — Z6832 Body mass index (BMI) 32.0-32.9, adult: Secondary | ICD-10-CM | POA: Diagnosis not present

## 2018-06-17 DIAGNOSIS — Z79899 Other long term (current) drug therapy: Secondary | ICD-10-CM

## 2018-06-17 DIAGNOSIS — E782 Mixed hyperlipidemia: Secondary | ICD-10-CM

## 2018-06-17 MED ORDER — PHENTERMINE HCL 37.5 MG PO TABS: ORAL_TABLET | ORAL | 5 refills | Status: DC

## 2018-06-17 MED ORDER — TOPIRAMATE 50 MG PO TABS: ORAL_TABLET | ORAL | 1 refills | Status: DC

## 2018-06-17 NOTE — Progress Notes (Signed)
This very nice 82 y.o. MWF presents for 6 month follow up with HTN, HLD, Pre-Diabetes and Vitamin D Deficiency.      Patient is treated for HTN (1992) & BP has been controlled at home. Today's BP is at goal -  138/78. Patient has had no complaints of any cardiac type chest pain, palpitations, dyspnea / orthopnea / PND, dizziness, claudication, or dependent edema.     Hyperlipidemia is controlled with diet & meds. Patient denies myalgias or other med SE's. Last Lipids were  Lab Results  Component Value Date   CHOL 149 03/12/2018   HDL 35 (L) 03/12/2018   LDLCALC 86 03/12/2018   TRIG 189 (H) 03/12/2018   CHOLHDL 4.3 03/12/2018      Also, the patient has history of T2_NIDDM (2009) w/CKD2  Which she is attempting dietary control. She has had no symptoms of reactive hypoglycemia, diabetic polys, paresthesias or visual blurring.  Last A1c was not at goal: Lab Results  Component Value Date   HGBA1C 6.1 (H) 03/12/2018      Further, the patient also has history of Vitamin D Deficiency and supplements vitamin D without any suspected side-effects. Last vitamin D was not at goal (70-100):  Lab Results  Component Value Date   VD25OH 41 11/14/2017   Current Outpatient Medications on File Prior to Visit  Medication Sig  . aspirin 81 MG tablet Take 81 mg by mouth daily.  Marland Kitchen atenolol (TENORMIN) 100 MG tablet TAKE 1 TABLET BY MOUTH  DAILY FOR BLOOD PRESSURE  . atorvastatin (LIPITOR) 80 MG tablet Take 1/2 to 1  tablet daily as directed for cholesterol  . Cholecalciferol (VITAMIN D PO) Take 5,000 Units by mouth 2 (two) times daily.   . cyclobenzaprine (FLEXERIL) 10 MG tablet Take 1 tablet (10 mg total) by mouth at bedtime as needed for muscle spasms.  Marland Kitchen lisinopril (PRINIVIL,ZESTRIL) 20 MG tablet TAKE 1 TABLET BY MOUTH IN  THE EVENING FOR BLOOD  PRESSURE  . meloxicam (MOBIC) 15 MG tablet Take 1/2 to 1 tablet daily with food for Pain / Inflammation & Limit to 5 days/week to prevent Kidney Damage  .  metFORMIN (GLUCOPHAGE-XR) 500 MG 24 hr tablet TAKE 1 TABLET BY MOUTH WITH BREAKFAST AND LUNCH AND 2  WITH SUPPER FOR DIABETES  . MILK THISTLE PO Take by mouth 2 (two) times daily.  . Multiple Vitamin (MULTIVITAMIN) tablet Take 1 tablet by mouth daily.  Marland Kitchen omeprazole (PRILOSEC) 40 MG capsule Take 1 capsule every morning for Acid Reflux  . ranitidine (ZANTAC) 300 MG tablet Take 1 tablet 2 x / day for Acid Reflux  . verapamil (CALAN) 120 MG tablet TAKE 1 TABLET BY MOUTH  TWICE A DAY  . vitamin B-12 (CYANOCOBALAMIN) 1000 MCG tablet Take 1,000 mcg by mouth daily.  . Calcium Citrate (CALCITRATE PO) Take 500 mg by mouth daily.   No current facility-administered medications on file prior to visit.    Allergies  Allergen Reactions  . Diltiazem    PMHx:   Past Medical History:  Diagnosis Date  . DDD (degenerative disc disease), lumbar   . Fatty liver disease, nonalcoholic    CT AB 7262  . Hyperlipidemia   . Hypertension   . Type II or unspecified type diabetes mellitus without mention of complication, not stated as uncontrolled   . Vitamin D deficiency    Immunization History  Administered Date(s) Administered  . H1N1 06/28/2008  . Influenza Whole 05/04/2013  . Influenza, High  Dose Seasonal PF 04/26/2014, 04/26/2015, 04/01/2016, 05/07/2017  . Influenza-Unspecified 04/14/2018  . Pneumococcal Conjugate-13 04/26/2014  . Pneumococcal Polysaccharide-23 07/10/2011  . Td 07/10/2011  . Zoster 06/28/2008   Past Surgical History:  Procedure Laterality Date  . APPENDECTOMY    . CATARACT EXTRACTION, BILATERAL Bilateral 09/2017   Dr. Gershon Crane  . KNEE ARTHROSCOPY Left   . MASTECTOMY Bilateral    30 years ago   FHx:    Reviewed / unchanged  SHx:    Reviewed / unchanged   Systems Review:  Constitutional: Denies fever, chills, wt changes, headaches, insomnia, fatigue, night sweats, change in appetite. Eyes: Denies redness, blurred vision, diplopia, discharge, itchy, watery eyes.  ENT: Denies  discharge, congestion, post nasal drip, epistaxis, sore throat, earache, hearing loss, dental pain, tinnitus, vertigo, sinus pain, snoring.  CV: Denies chest pain, palpitations, irregular heartbeat, syncope, dyspnea, diaphoresis, orthopnea, PND, claudication or edema. Respiratory: denies cough, dyspnea, DOE, pleurisy, hoarseness, laryngitis, wheezing.  Gastrointestinal: Denies dysphagia, odynophagia, heartburn, reflux, water brash, abdominal pain or cramps, nausea, vomiting, bloating, diarrhea, constipation, hematemesis, melena, hematochezia  or hemorrhoids. Genitourinary: Denies dysuria, frequency, urgency, nocturia, hesitancy, discharge, hematuria or flank pain. Musculoskeletal: Denies arthralgias, myalgias, stiffness, jt. swelling, pain, limping or strain/sprain.  Skin: Denies pruritus, rash, hives, warts, acne, eczema or change in skin lesion(s). Neuro: No weakness, tremor, incoordination, spasms, paresthesia or pain. Psychiatric: Denies confusion, memory loss or sensory loss. Endo: Denies change in weight, skin or hair change.  Heme/Lymph: No excessive bleeding, bruising or enlarged lymph nodes.  Physical Exam  BP 138/78   Pulse (!) 56   Temp (!) 97.3 F (36.3 C)   Resp 16   Ht 5\' 3"  (1.6 m)   Wt 168 lb 3.2 oz (76.3 kg)   BMI 29.80 kg/m   Appears  well nourished, well groomed  and in no distress.  Eyes: PERRLA, EOMs, conjunctiva no swelling or erythema. Sinuses: No frontal/maxillary tenderness ENT/Mouth: EAC's clear, TM's nl w/o erythema, bulging. Nares clear w/o erythema, swelling, exudates. Oropharynx clear without erythema or exudates. Oral hygiene is good. Tongue normal, non obstructing. Hearing intact.  Neck: Supple. Thyroid not palpable. Car 2+/2+ without bruits, nodes or JVD. Chest: Respirations nl with BS clear & equal w/o rales, rhonchi, wheezing or stridor.  Cor: Heart sounds normal w/ regular rate and rhythm without sig. murmurs, gallops, clicks or rubs. Peripheral  pulses normal and equal  without edema.  Abdomen: Soft & bowel sounds normal. Non-tender w/o guarding, rebound, hernias, masses or organomegaly.  Lymphatics: Unremarkable.  Musculoskeletal: Full ROM all peripheral extremities, joint stability, 5/5 strength and normal gait.  Skin: Warm, dry without exposed rashes, lesions or ecchymosis apparent.  Neuro: Cranial nerves intact, reflexes equal bilaterally. Sensory-motor testing grossly intact. Tendon reflexes grossly intact.  Pysch: Alert & oriented x 3.  Insight and judgement nl & appropriate. No ideations.  Assessment and Plan:  1. Essential hypertension  - Continue medication, monitor blood pressure at home.  - Continue DASH diet.  Reminder to go to the ER if any CP,  SOB, nausea, dizziness, severe HA, changes vision/speech.  - CBC with Differential/Platelet - COMPLETE METABOLIC PANEL WITH GFR - Magnesium - TSH  2. Hyperlipidemia, mixed  - Continue diet/meds, exercise,& lifestyle modifications.  - Continue monitor periodic cholesterol/liver & renal functions   - Lipid panel - TSH  3. Type 2 diabetes mellitus with stage 2 chronic kidney disease, without long-term current use of insulin (HCC)  - Continue diet, exercise,  - lifestyle modifications.  -  Monitor appropriate labs.  - Hemoglobin A1c - Insulin, random  4. Vitamin D deficiency  - Continue supplementation.  - VITAMIN D 25 Hydroxyl  5. Gastroesophageal reflux disease  - CBC with Differential/Platelet  6. Medication management  - CBC with Differential/Platelet - COMPLETE METABOLIC PANEL WITH GFR - Magnesium - Lipid panel - TSH - Hemoglobin A1c - Insulin, random - VITAMIN D 25 Hydroxyl  7. Class 1 obesity due to excess calories with serious comorbidity and body mass index (BMI) of 32.0 to 32.9 in adult  - topiramate  50 MG tablet; Take 1 tablet 2 x /day for Dieting & Weight Loss  Dispense: 180 tablet; Refill: 1  - phentermine  37.5 MG tablet; Take 1/2  to 1 tablet every morning for dieting & weight  loss  Dispense: 30 tablet; Refill: x 5      Discussed  regular exercise, BP monitoring, weight control to achieve/maintain BMI less than 25 and discussed med and SE's. Recommended labs to assess and monitor clinical status with further disposition pending results of labs. Over 30 minutes of exam, counseling, chart review was performed.

## 2018-06-17 NOTE — Patient Instructions (Signed)

## 2018-06-18 LAB — CBC WITH DIFFERENTIAL/PLATELET
BASOS ABS: 22 {cells}/uL (ref 0–200)
BASOS PCT: 0.4 %
Eosinophils Absolute: 167 cells/uL (ref 15–500)
Eosinophils Relative: 3.1 %
HEMATOCRIT: 43.3 % (ref 35.0–45.0)
HEMOGLOBIN: 14.6 g/dL (ref 11.7–15.5)
LYMPHS ABS: 1588 {cells}/uL (ref 850–3900)
MCH: 31.6 pg (ref 27.0–33.0)
MCHC: 33.7 g/dL (ref 32.0–36.0)
MCV: 93.7 fL (ref 80.0–100.0)
MPV: 10.9 fL (ref 7.5–12.5)
Monocytes Relative: 11.3 %
NEUTROS ABS: 3013 {cells}/uL (ref 1500–7800)
Neutrophils Relative %: 55.8 %
Platelets: 190 10*3/uL (ref 140–400)
RBC: 4.62 10*6/uL (ref 3.80–5.10)
RDW: 13.3 % (ref 11.0–15.0)
Total Lymphocyte: 29.4 %
WBC mixed population: 610 cells/uL (ref 200–950)
WBC: 5.4 10*3/uL (ref 3.8–10.8)

## 2018-06-18 LAB — LIPID PANEL
Cholesterol: 163 mg/dL (ref <200)
HDL: 35 mg/dL — AB (ref >50)
LDL Cholesterol (Calc): 99 mg/dL (calc)
Non-HDL Cholesterol (Calc): 128 mg/dL (calc) (ref <130)
TRIGLYCERIDES: 197 mg/dL — AB (ref <150)
Total CHOL/HDL Ratio: 4.7 (calc) (ref <5.0)

## 2018-06-18 LAB — COMPLETE METABOLIC PANEL WITH GFR
AG Ratio: 2.2 (calc) (ref 1.0–2.5)
ALT: 52 U/L — ABNORMAL HIGH (ref 6–29)
AST: 43 U/L — ABNORMAL HIGH (ref 10–35)
Albumin: 4.6 g/dL (ref 3.6–5.1)
Alkaline phosphatase (APISO): 49 U/L (ref 33–130)
BILIRUBIN TOTAL: 1.2 mg/dL (ref 0.2–1.2)
BUN: 24 mg/dL (ref 7–25)
CHLORIDE: 102 mmol/L (ref 98–110)
CO2: 30 mmol/L (ref 20–32)
Calcium: 9.7 mg/dL (ref 8.6–10.4)
Creat: 0.7 mg/dL (ref 0.60–0.88)
GFR, Est African American: 95 mL/min/{1.73_m2} (ref >=60)
GFR, Est Non African American: 82 mL/min/{1.73_m2} (ref >=60)
GLUCOSE: 126 mg/dL — AB (ref 65–99)
Globulin: 2.1 g/dL (calc) (ref 1.9–3.7)
Potassium: 4.4 mmol/L (ref 3.5–5.3)
Sodium: 139 mmol/L (ref 135–146)
Total Protein: 6.7 g/dL (ref 6.1–8.1)

## 2018-06-18 LAB — VITAMIN D 25 HYDROXY (VIT D DEFICIENCY, FRACTURES): Vit D, 25-Hydroxy: 42 ng/mL (ref 30–100)

## 2018-06-18 LAB — MAGNESIUM: Magnesium: 1.8 mg/dL (ref 1.5–2.5)

## 2018-06-18 LAB — HEMOGLOBIN A1C
EAG (MMOL/L): 7.3 (calc)
Hgb A1c MFr Bld: 6.2 % of total Hgb — ABNORMAL HIGH (ref <5.7)
Mean Plasma Glucose: 131 (calc)

## 2018-06-18 LAB — INSULIN, RANDOM: INSULIN: 12.8 u[IU]/mL (ref 2.0–19.6)

## 2018-06-18 LAB — TSH: TSH: 3.82 mIU/L (ref 0.40–4.50)

## 2018-06-19 DIAGNOSIS — J301 Allergic rhinitis due to pollen: Secondary | ICD-10-CM | POA: Diagnosis not present

## 2018-06-19 DIAGNOSIS — J3081 Allergic rhinitis due to animal (cat) (dog) hair and dander: Secondary | ICD-10-CM | POA: Diagnosis not present

## 2018-06-25 DIAGNOSIS — J3081 Allergic rhinitis due to animal (cat) (dog) hair and dander: Secondary | ICD-10-CM | POA: Diagnosis not present

## 2018-06-25 DIAGNOSIS — J301 Allergic rhinitis due to pollen: Secondary | ICD-10-CM | POA: Diagnosis not present

## 2018-06-27 ENCOUNTER — Other Ambulatory Visit: Payer: Self-pay | Admitting: Internal Medicine

## 2018-06-27 DIAGNOSIS — I10 Essential (primary) hypertension: Secondary | ICD-10-CM

## 2018-06-27 DIAGNOSIS — E782 Mixed hyperlipidemia: Secondary | ICD-10-CM

## 2018-06-27 DIAGNOSIS — E1122 Type 2 diabetes mellitus with diabetic chronic kidney disease: Secondary | ICD-10-CM

## 2018-06-27 DIAGNOSIS — N182 Chronic kidney disease, stage 2 (mild): Secondary | ICD-10-CM

## 2018-06-27 MED ORDER — ATENOLOL 100 MG PO TABS: ORAL_TABLET | ORAL | 3 refills | Status: DC

## 2018-06-27 MED ORDER — VERAPAMIL HCL 120 MG PO TABS: ORAL_TABLET | ORAL | 3 refills | Status: DC

## 2018-06-27 MED ORDER — METFORMIN HCL ER 500 MG PO TB24: ORAL_TABLET | ORAL | 3 refills | Status: DC

## 2018-06-27 MED ORDER — ATORVASTATIN CALCIUM 80 MG PO TABS: ORAL_TABLET | ORAL | 3 refills | Status: DC

## 2018-07-03 DIAGNOSIS — J301 Allergic rhinitis due to pollen: Secondary | ICD-10-CM | POA: Diagnosis not present

## 2018-07-03 DIAGNOSIS — J3081 Allergic rhinitis due to animal (cat) (dog) hair and dander: Secondary | ICD-10-CM | POA: Diagnosis not present

## 2018-07-10 DIAGNOSIS — J301 Allergic rhinitis due to pollen: Secondary | ICD-10-CM | POA: Diagnosis not present

## 2018-07-10 DIAGNOSIS — J3081 Allergic rhinitis due to animal (cat) (dog) hair and dander: Secondary | ICD-10-CM | POA: Diagnosis not present

## 2018-07-14 DIAGNOSIS — J301 Allergic rhinitis due to pollen: Secondary | ICD-10-CM | POA: Diagnosis not present

## 2018-07-14 DIAGNOSIS — J3081 Allergic rhinitis due to animal (cat) (dog) hair and dander: Secondary | ICD-10-CM | POA: Diagnosis not present

## 2018-07-23 ENCOUNTER — Other Ambulatory Visit: Payer: Self-pay

## 2018-07-23 DIAGNOSIS — I10 Essential (primary) hypertension: Secondary | ICD-10-CM

## 2018-07-23 DIAGNOSIS — N182 Chronic kidney disease, stage 2 (mild): Secondary | ICD-10-CM

## 2018-07-23 DIAGNOSIS — E1122 Type 2 diabetes mellitus with diabetic chronic kidney disease: Secondary | ICD-10-CM

## 2018-07-23 DIAGNOSIS — E782 Mixed hyperlipidemia: Secondary | ICD-10-CM

## 2018-07-23 DIAGNOSIS — J3081 Allergic rhinitis due to animal (cat) (dog) hair and dander: Secondary | ICD-10-CM | POA: Diagnosis not present

## 2018-07-23 DIAGNOSIS — J301 Allergic rhinitis due to pollen: Secondary | ICD-10-CM | POA: Diagnosis not present

## 2018-07-23 MED ORDER — ATENOLOL 100 MG PO TABS: ORAL_TABLET | ORAL | 1 refills | Status: DC

## 2018-07-23 MED ORDER — LISINOPRIL 20 MG PO TABS: ORAL_TABLET | ORAL | 1 refills | Status: DC

## 2018-07-23 MED ORDER — VERAPAMIL HCL 120 MG PO TABS: ORAL_TABLET | ORAL | 1 refills | Status: DC

## 2018-07-23 MED ORDER — MELOXICAM 15 MG PO TABS: ORAL_TABLET | ORAL | 1 refills | Status: DC

## 2018-07-23 MED ORDER — METFORMIN HCL ER 500 MG PO TB24: ORAL_TABLET | ORAL | 1 refills | Status: DC

## 2018-07-23 MED ORDER — OMEPRAZOLE 40 MG PO CPDR: DELAYED_RELEASE_CAPSULE | ORAL | 1 refills | Status: DC

## 2018-07-23 MED ORDER — ATORVASTATIN CALCIUM 80 MG PO TABS: ORAL_TABLET | ORAL | 1 refills | Status: DC

## 2018-07-23 NOTE — Telephone Encounter (Signed)
Request for prescriptions be sent to [facility].

## 2018-08-04 DIAGNOSIS — J3081 Allergic rhinitis due to animal (cat) (dog) hair and dander: Secondary | ICD-10-CM | POA: Diagnosis not present

## 2018-08-04 DIAGNOSIS — J301 Allergic rhinitis due to pollen: Secondary | ICD-10-CM | POA: Diagnosis not present

## 2018-08-06 DIAGNOSIS — M25511 Pain in right shoulder: Secondary | ICD-10-CM | POA: Diagnosis not present

## 2018-08-14 DIAGNOSIS — J3081 Allergic rhinitis due to animal (cat) (dog) hair and dander: Secondary | ICD-10-CM | POA: Diagnosis not present

## 2018-08-14 DIAGNOSIS — J301 Allergic rhinitis due to pollen: Secondary | ICD-10-CM | POA: Diagnosis not present

## 2018-08-14 DIAGNOSIS — J3089 Other allergic rhinitis: Secondary | ICD-10-CM | POA: Diagnosis not present

## 2018-08-19 DIAGNOSIS — M25511 Pain in right shoulder: Secondary | ICD-10-CM | POA: Diagnosis not present

## 2018-08-24 DIAGNOSIS — J301 Allergic rhinitis due to pollen: Secondary | ICD-10-CM | POA: Diagnosis not present

## 2018-08-24 DIAGNOSIS — J3081 Allergic rhinitis due to animal (cat) (dog) hair and dander: Secondary | ICD-10-CM | POA: Diagnosis not present

## 2018-09-02 DIAGNOSIS — M25511 Pain in right shoulder: Secondary | ICD-10-CM | POA: Diagnosis not present

## 2018-09-02 DIAGNOSIS — J3081 Allergic rhinitis due to animal (cat) (dog) hair and dander: Secondary | ICD-10-CM | POA: Diagnosis not present

## 2018-09-02 DIAGNOSIS — J301 Allergic rhinitis due to pollen: Secondary | ICD-10-CM | POA: Diagnosis not present

## 2018-09-03 DIAGNOSIS — M25511 Pain in right shoulder: Secondary | ICD-10-CM | POA: Diagnosis not present

## 2018-09-10 DIAGNOSIS — Z9013 Acquired absence of bilateral breasts and nipples: Secondary | ICD-10-CM | POA: Diagnosis not present

## 2018-09-11 DIAGNOSIS — J301 Allergic rhinitis due to pollen: Secondary | ICD-10-CM | POA: Diagnosis not present

## 2018-09-11 DIAGNOSIS — J3081 Allergic rhinitis due to animal (cat) (dog) hair and dander: Secondary | ICD-10-CM | POA: Diagnosis not present

## 2018-09-16 NOTE — Progress Notes (Signed)
MEDICARE ANNUAL WELLNESS VISIT AND 3  MONTH FOLLOW UP  Assessment:   Medicare annual wellness visit 1 year  Essential hypertension - continue medications, DASH diet, exercise and monitor at home. Call if greater than 130/80.  -     CBC with Differential/Platelet -     CMP/GFR -     TSH  DM with CKD Discussed general issues about diabetes pathophysiology and management., Educational material distributed., Suggested low cholesterol diet., Encouraged aerobic exercise., Discussed foot care., Reminded to get yearly retinal exam. -     Hemoglobin A1c  Mixed hyperlipidemia -continue medications, check lipids, decrease fatty foods, increase activity.  -     Lipid panel  Vitamin D deficiency Continue supplement Defer check to next visit -  Medication management -     Magnesium  Chronic obstructive pulmonary disease, unspecified COPD type ([place]) Avoid triggers, no symptoms.   GERD Well managed on current medications Discussed diet, avoiding triggers and other lifestyle changes  Overweight Long discussion about weight loss, diet, and exercise Recommended diet heavy in fruits and veggies and low in animal meats, cheeses, and dairy products, appropriate calorie intake Discussed appropriate weight for height and initial goal (160 lb) Continue medications, cut back on topamax to just at night to avoid dizziness/memory Follow up at next visit  Unsteady gait Residual LLE weakness after back surgery, decrease knee reflex on left, decreased sensation L4-L3 distribution on left, seeing Dr. Nelva Bush Suggest getting in a pool  Over 40 minutes of exam, counseling, chart review and critical decision making was performed Future Appointments  Date Time Provider [place]  12/15/2018 10:00 AM Unk Pinto, MD [facility] None  03/19/2019  9:30 AM Liane Comber, NP [facility] None     Plan:   During the course of the visit the patient was educated and counseled about appropriate  screening and preventive services including:    Pneumococcal vaccine   Prevnar 13  Influenza vaccine  Td vaccine  Screening electrocardiogram  Bone densitometry screening  Colorectal cancer screening  Diabetes screening  Glaucoma screening  Nutrition counseling   Advanced directives: requested   Subjective:  Debra Collins is a 83 y.o. female who presents for Medicare Annual Wellness Visit and 3 month follow up. Patient's GERD is controlled with her meds.  BMI is Body mass index is 25.58 kg/m., she has not been working on diet and exercise, she has cut out wine other than Friday nights, she has cut out cakes/cookies/pies and has started phentermine and topmax from last visit.   Wt Readings from Last 3 Encounters:  09/17/18 144 lb 6.4 oz (65.5 kg)  06/17/18 168 lb 3.2 oz (76.3 kg)  03/12/18 164 lb (74.4 kg)   Her blood pressure has been controlled at home, today their BP is BP: 136/80 Patient admits to not taking BP medication this AM.   She does not workout. She denies chest pain, shortness of breath, dizziness.   She is on cholesterol medication (atorvastatin 80 mg TAKE 1 PILL AND 1/2 PILL ALTERNATING) and denies myalgias. Her cholesterol is not at goal. The cholesterol last visit was:    Lab Results  Component Value Date   CHOL 163 06/17/2018   HDL 35 (L) 06/17/2018   LDLCALC 99 06/17/2018   TRIG 197 (H) 06/17/2018   CHOLHDL 4.7 06/17/2018    She has been working on diet and exercise for diabetes with CKD (on metformin 2000 mg daily), she is on bASA, she is on ACE, and denies paresthesia  of the feet, polydipsia and polyuria. Last A1C in the office was:  Lab Results  Component Value Date   HGBA1C 6.2 (H) 06/17/2018   Lab Results  Component Value Date   GFRNONAA 82 06/17/2018   Patient is on Vitamin D supplement - 5000 units daily, but forgets to take   Lab Results  Component Value Date   VD25OH 42 06/17/2018        Medication Review: Current  Outpatient Medications on File Prior to Visit  Medication Sig Dispense Refill  . aspirin 81 MG tablet Take 81 mg by mouth daily.    Marland Kitchen atenolol (TENORMIN) 100 MG tablet Take 1 tablet daily for BP 90 tablet 1  . atorvastatin (LIPITOR) 80 MG tablet Take 1/2  tablet daily as directed for cholesterol 90 tablet 1  . Calcium Citrate (CALCITRATE PO) Take 500 mg by mouth daily.    . Cholecalciferol (VITAMIN D PO) Take 5,000 Units by mouth 2 (two) times daily.     . cyclobenzaprine (FLEXERIL) 10 MG tablet Take 1 tablet (10 mg total) by mouth at bedtime as needed for muscle spasms. 30 tablet 0  . lisinopril (PRINIVIL,ZESTRIL) 20 MG tablet TAKE 1 TABLET BY MOUTH IN  THE EVENING FOR BLOOD  PRESSURE 90 tablet 1  . meloxicam (MOBIC) 15 MG tablet Take 1/2 to 1 tablet daily with food for Pain / Inflammation & Limit to 5 days/week to prevent Kidney Damage 90 tablet 1  . metFORMIN (GLUCOPHAGE-XR) 500 MG 24 hr tablet Take 1 tablet with Breakfast & Lunch & 2 tablets with Supper for Diabetes 360 tablet 1  . MILK THISTLE PO Take by mouth 2 (two) times daily.    . Multiple Vitamin (MULTIVITAMIN) tablet Take 1 tablet by mouth daily.    Marland Kitchen omeprazole (PRILOSEC) 40 MG capsule Take 1 capsule every morning for Acid Reflux 90 capsule 1  . phentermine (ADIPEX-P) 37.5 MG tablet Take 1/2 to 1 tablet every morning for dieting & weight  loss 30 tablet 5  . ranitidine (ZANTAC) 300 MG tablet Take 1 tablet 2 x / day for Acid Reflux 180 tablet 3  . topiramate (TOPAMAX) 50 MG tablet Take 1 tablet 2 x /day for Dieting & Weight Loss 180 tablet 1  . verapamil (CALAN) 120 MG tablet Take 1 tablet 2 x /day for BP 180 tablet 1  . vitamin B-12 (CYANOCOBALAMIN) 1000 MCG tablet Take 1,000 mcg by mouth daily.     No current facility-administered medications on file prior to visit.     Current Problems (verified) Patient Active Problem List   Diagnosis Date Noted  . Unsteady gait 03/12/2018  . GERD (gastroesophageal reflux disease)  03/11/2018  . Overweight (BMI 25.0-29.9) 08/11/2017  . Chronic obstructive pulmonary disease ([place]) 01/29/2017  . Encounter for Medicare annual wellness exam 03/09/2015  . Well controlled type 2 diabetes mellitus with nephropathy ([place]) 08/16/2014  . Medication management 01/23/2014  . Essential hypertension 07/25/2013  . Mixed hyperlipidemia 07/25/2013  . Vitamin D deficiency 07/25/2013    Screening Tests Immunization History  Administered Date(s) Administered  . H1N1 06/28/2008  . Influenza Whole 05/04/2013  . Influenza, High Dose Seasonal PF 04/26/2014, 04/26/2015, 04/01/2016, 05/07/2017  . Influenza-Unspecified 04/14/2018  . Pneumococcal Conjugate-13 04/26/2014  . Pneumococcal Polysaccharide-23 07/10/2011  . Td 07/10/2011  . Zoster 06/28/2008   Preventative care: Last colonoscopy: 2005 declines another Last mammogram: bilateral mastectomy DEXA: 01/2016 - normal US transvaginal 08/2016  Prior vaccinations: TD or Tdap: 2012  Influenza: 2019 Pneumococcal:  2012 Prevnar13: 2015 Shingles/Zostavax: 2009  Names of Other Physician/Practitioners you currently use: 1. [facility] here for primary care 2. Dr. Gershon Crane, eye doctor, last visit 09/2017 HAD CATARACT SURGERY AND STATES SIGHT GOT WORSE 3., Dr. Lavone Neri dentist, last visit 09/2017, goes q53m  Patient Care Team: Unk Pinto, MD as PCP - General (Internal Medicine) Melissa Noon, OD (Optometry)  Allergies Allergies  Allergen Reactions  . Diltiazem     SURGICAL HISTORY She  has a past surgical history that includes Knee arthroscopy (Left); Appendectomy; Mastectomy (Bilateral); and Cataract extraction, bilateral (Bilateral, 09/2017). FAMILY HISTORY Her family history includes Heart attack in her father and sister; Heart disease in her father and mother; Stroke in her mother. SOCIAL HISTORY She  reports that she quit smoking about 25 years ago. She has never used smokeless  tobacco. She reports that she does not drink alcohol or use drugs.  MEDICARE WELLNESS OBJECTIVES: Physical activity: Current Exercise Habits: (P) The patient does not participate in regular exercise at present, Exercise limited by: (P) orthopedic condition(s) Cardiac risk factors: Cardiac Risk Factors include: (P) dyslipidemia;advanced age (>70men, >27 women);diabetes mellitus;hypertension;obesity (BMI >30kg/m2);sedentary lifestyle Depression/mood screen:   Depression screen [facility] 2/9 09/17/2018  Decreased Interest 0  Down, Depressed, Hopeless 0  PHQ - 2 Score 0    ADLs:  In your present state of health, do you have any difficulty performing the following activities: 09/17/2018 03/12/2018  Hearing? N N  Vision? N N  Difficulty concentrating or making decisions? Y N  Comment since being on topamax -  Walking or climbing stairs? Y Y  Comment Residual wekanes of lef leg after back surgery residual weakness of left leg after back surgery  Dressing or bathing? N N  Doing errands, shopping? N N  Some recent data might be hidden     Cognitive Testing  Alert? Yes  Normal Appearance?Yes  Oriented to person? Yes  Place? Yes   Time? Yes  Recall of three objects?  Yes  Can perform simple calculations? Yes  Displays appropriate judgment?Yes  Can read the correct time from a watch face?Yes  EOL planning: Does Patient Have a Medical Advance Directive?: Yes Type of Advance Directive: Healthcare Power of Attorney, Living will Does patient want to make changes to medical advance directive?: No - Patient declined  Review of Systems  Constitutional: Negative for chills, fever, malaise/fatigue and weight loss.  HENT: Negative for congestion, ear pain, hearing loss, sore throat and tinnitus.   Eyes: Negative.  Negative for blurred vision and double vision.  Respiratory: Negative for cough, sputum production, shortness of breath and wheezing.   Cardiovascular: Negative for chest pain, palpitations,  orthopnea, claudication, leg swelling and PND.  Gastrointestinal: Negative for abdominal pain, blood in stool, constipation, diarrhea, heartburn, melena, nausea and vomiting.  Genitourinary: Negative.   Musculoskeletal: Negative for falls, joint pain and myalgias.  Skin: Negative.  Negative for rash.  Neurological: Negative for dizziness, tingling, sensory change, loss of consciousness, weakness and headaches.  Endo/Heme/Allergies: Negative for polydipsia.  Psychiatric/Behavioral: Negative.  Negative for depression, memory loss, substance abuse and suicidal ideas. The patient is not nervous/anxious and does not have insomnia.   All other systems reviewed and are negative.    Objective:     Today's Vitals   09/17/18 1021  BP: 136/80  Pulse: 68  Temp: (!) 97.3 F (36.3 C)  SpO2: 98%  Weight: 144 lb 6.4 oz (65.5 kg)  Height: 5\' 3"  (1.6 m)   Body  mass index is 25.58 kg/m.  General appearance: alert, no distress, WD/WN, female HEENT: normocephalic, sclerae anicteric, TMs pearly, nares patent, no discharge or erythema, pharynx normal Oral cavity: MMM, no lesions Neck: supple, no lymphadenopathy, no thyromegaly, no masses Heart: RRR, normal S1, S2, no murmurs Lungs: CTA bilaterally, no wheezes, rhonchi, or rales Abdomen: +bs, soft, non tender, non distended, no masses, no hepatomegaly, no splenomegaly Musculoskeletal: nontender, no swelling, no obvious deformity Extremities: no edema, no cyanosis, no clubbing Pulses: 2+ symmetric, upper and lower extremities, normal cap refill Neurological: alert, oriented x 3, CN2-12 intact, strength normal upper extremities, LLE mildly weak, sensation normal throughout, DTRs 2+ throughout, no cerebellar signs, gait slow Psychiatric: normal affect, behavior normal, pleasant   Medicare Attestation I have personally reviewed: The patient's medical and social history Their use of alcohol, tobacco or illicit drugs Their current medications and  supplements The patient's functional ability including ADLs,fall risks, home safety risks, cognitive, and hearing and visual impairment Diet and physical activities Evidence for depression or mood disorders  The patient's weight, height, BMI, and visual acuity have been recorded in the chart.  I have made referrals, counseling, and provided education to the patient based on review of the above and I have provided the patient with a written personalized care plan for preventive services.     Vicie Mutters, PA-C   09/17/2018

## 2018-09-17 ENCOUNTER — Ambulatory Visit (INDEPENDENT_AMBULATORY_CARE_PROVIDER_SITE_OTHER): Payer: PPO | Admitting: Physician Assistant

## 2018-09-17 ENCOUNTER — Encounter: Payer: Self-pay | Admitting: Physician Assistant

## 2018-09-17 VITALS — BP 136/80 | HR 68 | Temp 97.3°F | Ht 63.0 in | Wt 144.4 lb

## 2018-09-17 DIAGNOSIS — E559 Vitamin D deficiency, unspecified: Secondary | ICD-10-CM | POA: Diagnosis not present

## 2018-09-17 DIAGNOSIS — Z0001 Encounter for general adult medical examination with abnormal findings: Secondary | ICD-10-CM | POA: Diagnosis not present

## 2018-09-17 DIAGNOSIS — J449 Chronic obstructive pulmonary disease, unspecified: Secondary | ICD-10-CM

## 2018-09-17 DIAGNOSIS — R2681 Unsteadiness on feet: Secondary | ICD-10-CM | POA: Diagnosis not present

## 2018-09-17 DIAGNOSIS — Z79899 Other long term (current) drug therapy: Secondary | ICD-10-CM

## 2018-09-17 DIAGNOSIS — E1121 Type 2 diabetes mellitus with diabetic nephropathy: Secondary | ICD-10-CM

## 2018-09-17 DIAGNOSIS — E782 Mixed hyperlipidemia: Secondary | ICD-10-CM | POA: Diagnosis not present

## 2018-09-17 DIAGNOSIS — E663 Overweight: Secondary | ICD-10-CM | POA: Diagnosis not present

## 2018-09-17 DIAGNOSIS — I10 Essential (primary) hypertension: Secondary | ICD-10-CM

## 2018-09-17 DIAGNOSIS — Z0000 Encounter for general adult medical examination without abnormal findings: Secondary | ICD-10-CM

## 2018-09-17 DIAGNOSIS — R6889 Other general symptoms and signs: Secondary | ICD-10-CM | POA: Diagnosis not present

## 2018-09-17 DIAGNOSIS — K219 Gastro-esophageal reflux disease without esophagitis: Secondary | ICD-10-CM

## 2018-09-17 NOTE — Patient Instructions (Addendum)
TOPAMAX CAN TAKE JUST AT NIGHT IF YOU WANT  Going to start you on topamax, start on 1/2 pill for 3-5 nights, can increase to a whole pill for 1-2 weeks.  This medication is good for weight loss, headaches, pain This medication can cause numbness, tingling and can cause brain fog- stop if you get these  Let me know how you are doing with this.     Topiramate tablets What is this medicine? TOPIRAMATE (toe PYRE a mate) is used to treat seizures in adults or children with epilepsy. It is also used for the prevention of migraine headaches. This medicine may be used for other purposes; ask your health care provider or pharmacist if you have questions. COMMON BRAND NAME(S): Topamax, Topiragen What should I tell my health care provider before I take this medicine? They need to know if you have any of these conditions: -bleeding disorders -cirrhosis of the liver or liver disease -diarrhea -glaucoma -kidney stones or kidney disease -low blood counts, like low white cell, platelet, or red cell counts -lung disease like asthma, obstructive pulmonary disease, emphysema -metabolic acidosis -on a ketogenic diet -schedule for surgery or a procedure -suicidal thoughts, plans, or attempt; a previous suicide attempt by you or a family member -an unusual or allergic reaction to topiramate, other medicines, foods, dyes, or preservatives -pregnant or trying to get pregnant -breast-feeding How should I use this medicine? Take this medicine by mouth with a glass of water. Follow the directions on the prescription label. Do not crush or chew. You may take this medicine with meals. Take your medicine at regular intervals. Do not take it more often than directed. Talk to your pediatrician regarding the use of this medicine in children. Special care may be needed. While this drug may be prescribed for children as young as 54 years of age for selected conditions, precautions do apply. Overdosage: If you think  you have taken too much of this medicine contact a poison control center or emergency room at once. NOTE: This medicine is only for you. Do not share this medicine with others. What if I miss a dose? If you miss a dose, take it as soon as you can. If your next dose is to be taken in less than 6 hours, then do not take the missed dose. Take the next dose at your regular time. Do not take double or extra doses. What may interact with this medicine? Do not take this medicine with any of the following medications: -probenecid This medicine may also interact with the following medications: -acetazolamide -alcohol -amitriptyline -aspirin and aspirin-like medicines -birth control pills -certain medicines for depression -certain medicines for seizures -certain medicines that treat or prevent blood clots like warfarin, enoxaparin, dalteparin, apixaban, dabigatran, and rivaroxaban -digoxin -hydrochlorothiazide -lithium -medicines for pain, sleep, or muscle relaxation -metformin -methazolamide -NSAIDS, medicines for pain and inflammation, like ibuprofen or naproxen -pioglitazone -risperidone This list may not describe all possible interactions. Give your health care provider a list of all the medicines, herbs, non-prescription drugs, or dietary supplements you use. Also tell them if you smoke, drink alcohol, or use illegal drugs. Some items may interact with your medicine. What should I watch for while using this medicine? Visit your doctor or health care professional for regular checks on your progress. Do not stop taking this medicine suddenly. This increases the risk of seizures if you are using this medicine to control epilepsy. Wear a medical identification bracelet or chain to say you have epilepsy or  seizures, and carry a card that lists all your medicines. This medicine can decrease sweating and increase your body temperature. Watch for signs of deceased sweating or fever, especially in  children. Avoid extreme heat, hot baths, and saunas. Be careful about exercising, especially in hot weather. Contact your health care provider right away if you notice a fever or decrease in sweating. You should drink plenty of fluids while taking this medicine. If you have had kidney stones in the past, this will help to reduce your chances of forming kidney stones. If you have stomach pain, with nausea or vomiting and yellowing of your eyes or skin, call your doctor immediately. You may get drowsy, dizzy, or have blurred vision. Do not drive, use machinery, or do anything that needs mental alertness until you know how this medicine affects you. To reduce dizziness, do not sit or stand up quickly, especially if you are an older patient. Alcohol can increase drowsiness and dizziness. Avoid alcoholic drinks. If you notice blurred vision, eye pain, or other eye problems, seek medical attention at once for an eye exam. The use of this medicine may increase the chance of suicidal thoughts or actions. Pay special attention to how you are responding while on this medicine. Any worsening of mood, or thoughts of suicide or dying should be reported to your health care professional right away. This medicine may increase the chance of developing metabolic acidosis. If left untreated, this can cause kidney stones, bone disease, or slowed growth in children. Symptoms include breathing fast, fatigue, loss of appetite, irregular heartbeat, or loss of consciousness. Call your doctor immediately if you experience any of these side effects. Also, tell your doctor about any surgery you plan on having while taking this medicine since this may increase your risk for metabolic acidosis. Birth control pills may not work properly while you are taking this medicine. Talk to your doctor about using an extra method of birth control. Women who become pregnant while using this medicine may enroll in the [place]  Pregnancy Registry by calling [phone]. This registry collects information about the safety of antiepileptic drug use during pregnancy. What side effects may I notice from receiving this medicine? Side effects that you should report to your doctor or health care professional as soon as possible: -allergic reactions like skin rash, itching or hives, swelling of the face, lips, or tongue -decreased sweating and/or rise in body temperature -depression -difficulty breathing, fast or irregular breathing patterns -difficulty speaking -difficulty walking or controlling muscle movements -hearing impairment -redness, blistering, peeling or loosening of the skin, including inside the mouth -tingling, pain or numbness in the hands or feet -unusual bleeding or bruising -unusually weak or tired -worsening of mood, thoughts or actions of suicide or dying Side effects that usually do not require medical attention (report to your doctor or health care professional if they continue or are bothersome): -altered taste -back pain, joint or muscle aches and pains -diarrhea, or constipation -headache -loss of appetite -nausea -stomach upset, indigestion -tremors This list may not describe all possible side effects. Call your doctor for medical advice about side effects. You may report side effects to FDA at 1-800-FDA-1088. Where should I keep my medicine? Keep out of the reach of children. Store at room temperature between 15 and 30 degrees C (59 and 86 degrees F) in a tightly closed container. Protect from moisture. Throw away any unused medicine after the expiration date. NOTE: This sheet is a summary. It may  not cover all possible information. If you have questions about this medicine, talk to your doctor, pharmacist, or health care provider.  2018 Elsevier/Gold Standard (2013-07-05 23:17:57)   Phentermine  While taking the medication we may ask that you come into the office once a month for the  first month for a blood pressure check and EKG.   Also please bring in a food log for that visit to review. It is helpful if you bring in a food diary or use an app on your phone such as myfitnesspal to record your calorie intake, especially in the beginning. BRING FOR YOUR FIRST VISIT.  After that first initial visit, we will want to see you once every 2-3 months to monitor your weight, blood pressure, and heart rate.   In addition we can help answer your questions about diet, exercise, and help you every step of the way with your weight loss journey.  You can start out on 1/3 to 1/2 a pill in the morning and if you are tolerating it well you can increase to one pill daily. I also have some patients that take 1/3 or 1/2 at lunch to help prevent night time eating.  This medication is cheapest CASH pay at [facility] is 14-17 dollars and you do NOT need a membership to get meds from there.   It causes dry mouth and constipation in almost every patient, so try to get 80-100 oz of water a day and increase fiber such as veggies. You can add on a stool softener if you would like.   It can give you energy however it can also cause some people to be shaky, anxious or have palpitations. Stop this medication if that happens and contact the office.   If this medication does not work for you there are several medications that we can try to help rewire your brain in addition to making healthier habits.   What is this medicine? PHENTERMINE (FEN ter meen) decreases your appetite. This medicine is intended to be used in addition to a healthy reduced calorie diet and exercise. The best results are achieved this way. This medicine is only indicated for short-term use. Eventually your weight loss may level out and the medication will no longer be needed.   How should I use this medicine? Take this medicine by mouth. Follow the directions on the prescription label. The tablets should stay in  the bottle until immediately before you take your dose. Take your doses at regular intervals. Do not take your medicine more often than directed.  Overdosage: If you think you have taken too much of this medicine contact a poison control center or emergency room at once. NOTE: This medicine is only for you. Do not share this medicine with others.  What if I miss a dose? If you miss a dose, take it as soon as you can. If it is almost time for your next dose, take only that dose. Do not take double or extra doses. Do not increase or in any way change your dose without consulting your doctor.  What should I watch for while using this medicine? Notify your physician immediately if you become short of breath while doing your normal activities. Do not take this medicine within 6 hours of bedtime. It can keep you from getting to sleep. Avoid drinks that contain caffeine and try to stick to a regular bedtime every night. Do not stand or sit up quickly, especially if you  are an older patient. This reduces the risk of dizzy or fainting spells. Avoid alcoholic drinks.  What side effects may I notice from receiving this medicine? Side effects that you should report to your doctor or health care professional as soon as possible: -chest pain, palpitations -depression or severe changes in mood -increased blood pressure -irritability -nervousness or restlessness -severe dizziness -shortness of breath -problems urinating -unusual swelling of the legs -vomiting  Side effects that usually do not require medical attention (report to your doctor or health care professional if they continue or are bothersome): -blurred vision or other eye problems -changes in sexual ability or desire -constipation or diarrhea -difficulty sleeping -dry mouth or unpleasant taste -headache -nausea This list may not describe all possible side effects. Call your doctor for medical advice about side effects. You may report side  effects to FDA at 1-800-FDA-1088.     When it comes to diets, agreement about the perfect plan isn't easy to find, even among the experts. Experts at the [employer] developed an idea known as the Healthy Eating Plate. Just imagine a plate divided into logical, healthy portions.  The emphasis is on diet quality:  Load up on vegetables and fruits - one-half of your plate: Aim for color and variety, and remember that potatoes don't count.  Go for whole grains - one-quarter of your plate: Whole wheat, barley, wheat berries, quinoa, oats, brown rice, and foods made with them. If you want pasta, go with whole wheat pasta.  Protein power - one-quarter of your plate: Fish, chicken, beans, and nuts are all healthy, versatile protein sources. Limit red meat.  The diet, however, does go beyond the plate, offering a few other suggestions.  Use healthy plant oils, such as olive, canola, soy, corn, sunflower and peanut. Check the labels, and avoid partially hydrogenated oil, which have unhealthy trans fats.  If you're thirsty, drink water. Coffee and tea are good in moderation, but skip sugary drinks and limit milk and dairy products to one or two daily servings.  The type of carbohydrate in the diet is more important than the amount. Some sources of carbohydrates, such as vegetables, fruits, whole grains, and beans-are healthier than others.  Finally, stay active.

## 2018-09-18 ENCOUNTER — Encounter: Payer: Self-pay | Admitting: *Deleted

## 2018-09-18 LAB — CBC WITH DIFFERENTIAL/PLATELET
Absolute Monocytes: 620 cells/uL (ref 200–950)
Basophils Absolute: 18 cells/uL (ref 0–200)
Basophils Relative: 0.3 %
Eosinophils Absolute: 100 cells/uL (ref 15–500)
Eosinophils Relative: 1.7 %
HCT: 42.2 % (ref 35.0–45.0)
Hemoglobin: 14.3 g/dL (ref 11.7–15.5)
Lymphs Abs: 1569 cells/uL (ref 850–3900)
MCH: 32.1 pg (ref 27.0–33.0)
MCHC: 33.9 g/dL (ref 32.0–36.0)
MCV: 94.6 fL (ref 80.0–100.0)
MPV: 11.3 fL (ref 7.5–12.5)
Monocytes Relative: 10.5 %
Neutro Abs: 3593 cells/uL (ref 1500–7800)
Neutrophils Relative %: 60.9 %
PLATELETS: 185 10*3/uL (ref 140–400)
RBC: 4.46 10*6/uL (ref 3.80–5.10)
RDW: 13 % (ref 11.0–15.0)
TOTAL LYMPHOCYTE: 26.6 %
WBC: 5.9 10*3/uL (ref 3.8–10.8)

## 2018-09-18 LAB — LIPID PANEL
CHOL/HDL RATIO: 2.9 (calc) (ref <5.0)
Cholesterol: 103 mg/dL (ref <200)
HDL: 36 mg/dL — ABNORMAL LOW (ref >=50)
LDL CHOLESTEROL (CALC): 48 mg/dL
Non-HDL Cholesterol (Calc): 67 mg/dL (calc) (ref <130)
Triglycerides: 111 mg/dL (ref <150)

## 2018-09-18 LAB — COMPLETE METABOLIC PANEL WITH GFR
AG Ratio: 2.8 (calc) — ABNORMAL HIGH (ref 1.0–2.5)
ALT: 36 U/L — ABNORMAL HIGH (ref 6–29)
AST: 29 U/L (ref 10–35)
Albumin: 4.8 g/dL (ref 3.6–5.1)
Alkaline phosphatase (APISO): 41 U/L (ref 37–153)
BUN/Creatinine Ratio: 38 (calc) — ABNORMAL HIGH (ref 6–22)
BUN: 27 mg/dL — ABNORMAL HIGH (ref 7–25)
CALCIUM: 9.7 mg/dL (ref 8.6–10.4)
CO2: 25 mmol/L (ref 20–32)
CREATININE: 0.71 mg/dL (ref 0.60–0.88)
Chloride: 106 mmol/L (ref 98–110)
GFR, Est African American: 93 mL/min/{1.73_m2} (ref >=60)
GFR, Est Non African American: 80 mL/min/{1.73_m2} (ref >=60)
Globulin: 1.7 g/dL (calc) — ABNORMAL LOW (ref 1.9–3.7)
Glucose, Bld: 86 mg/dL (ref 65–99)
Potassium: 3.9 mmol/L (ref 3.5–5.3)
Sodium: 140 mmol/L (ref 135–146)
Total Bilirubin: 0.9 mg/dL (ref 0.2–1.2)
Total Protein: 6.5 g/dL (ref 6.1–8.1)

## 2018-09-18 LAB — TSH: TSH: 2.81 mIU/L (ref 0.40–4.50)

## 2018-09-18 LAB — HEMOGLOBIN A1C
EAG (MMOL/L): 6.2 (calc)
Hgb A1c MFr Bld: 5.5 % of total Hgb (ref <5.7)
Mean Plasma Glucose: 111 (calc)

## 2018-09-18 LAB — MAGNESIUM: Magnesium: 1.5 mg/dL (ref 1.5–2.5)

## 2018-09-22 DIAGNOSIS — J3081 Allergic rhinitis due to animal (cat) (dog) hair and dander: Secondary | ICD-10-CM | POA: Diagnosis not present

## 2018-09-22 DIAGNOSIS — J301 Allergic rhinitis due to pollen: Secondary | ICD-10-CM | POA: Diagnosis not present

## 2018-09-25 DIAGNOSIS — M545 Low back pain: Secondary | ICD-10-CM | POA: Diagnosis not present

## 2018-09-25 DIAGNOSIS — M5416 Radiculopathy, lumbar region: Secondary | ICD-10-CM | POA: Diagnosis not present

## 2018-09-28 DIAGNOSIS — M5136 Other intervertebral disc degeneration, lumbar region: Secondary | ICD-10-CM | POA: Diagnosis not present

## 2018-10-02 DIAGNOSIS — M545 Low back pain: Secondary | ICD-10-CM | POA: Diagnosis not present

## 2018-10-05 ENCOUNTER — Other Ambulatory Visit: Payer: Self-pay

## 2018-10-05 DIAGNOSIS — I10 Essential (primary) hypertension: Secondary | ICD-10-CM

## 2018-10-05 MED ORDER — LISINOPRIL 20 MG PO TABS: ORAL_TABLET | ORAL | 1 refills | Status: DC

## 2018-10-05 MED ORDER — OMEPRAZOLE 40 MG PO CPDR: DELAYED_RELEASE_CAPSULE | ORAL | 1 refills | Status: DC

## 2018-10-05 MED ORDER — VERAPAMIL HCL 120 MG PO TABS: ORAL_TABLET | ORAL | 1 refills | Status: DC

## 2018-10-05 NOTE — Telephone Encounter (Signed)
Verapamil, omeprazole and lisinopril refill request

## 2018-10-13 DIAGNOSIS — M5136 Other intervertebral disc degeneration, lumbar region: Secondary | ICD-10-CM | POA: Diagnosis not present

## 2018-10-13 DIAGNOSIS — M961 Postlaminectomy syndrome, not elsewhere classified: Secondary | ICD-10-CM | POA: Diagnosis not present

## 2018-12-14 ENCOUNTER — Encounter: Payer: Self-pay | Admitting: Internal Medicine

## 2018-12-14 NOTE — Progress Notes (Signed)
[facility] Unk Pinto, M.D.     Uvaldo Bristle. Silverio Lay, P.A.-C Liane Comber, DNP _______________________________________________ [facility] [place], [place] [address] Telephone [phone] Telefax [phone] Annual Screening/Preventative Visit & Comprehensive Evaluation &  Examination     This very nice 83 y.o. MWF presents for a Screening /Preventative Visit & comprehensive evaluation and management of multiple medical co-morbidities.  Patient has been followed for HTN, HLD, T2_NIDDM  and Vitamin D Deficiency.      HTN predates since 98. Patient's BP has been controlled at home and patient denies any cardiac symptoms as chest pain, palpitations, shortness of breath, dizziness or ankle swelling. Today's BP was slightly elevated & rechecked at goal - 138/86.      Patient's hyperlipidemia is controlled with diet and Atorvastatin. Patient denies myalgias or other medication SE's. Last lipids were at goal: Lab Results  Component Value Date   CHOL 103 09/17/2018   HDL 36 (L) 09/17/2018   LDLCALC 48 09/17/2018   TRIG 111 09/17/2018   CHOLHDL 2.9 09/17/2018      Patient has hx/o T2_NIDDM (2009) w/CKD2  (A1c 6.5% / 2009 & 2012 and 6.4% / 2016) and patient denies reactive hypoglycemic symptoms, visual blurring, diabetic polys or paresthesias. Patient has lost 31# in the last 6 month in anticipation of being able to stop Metformin.  Last A1c was much better & Normal & at goal: Lab Results  Component Value Date   HGBA1C 5.5 09/17/2018   Wt Readings from Last 3 Encounters:  12/15/18 137 lb (62.1 kg)  09/17/18 144 lb 6.4 oz (65.5 kg)  06/17/18 168 lb 3.2 oz (76.3 kg)      Finally, patient has history of Vitamin D Deficiency ("39"/ 2018)  and last Vitamin D was low (goal 70-100): Lab Results  Component Value Date   VD25OH 42 06/17/2018   Current Outpatient Medications on File Prior to Visit   Medication Sig  . aspirin 81 MG tablet Take 81 mg by mouth daily.  Marland Kitchen atenolol (TENORMIN) 100 MG tablet Take 1 tablet daily for BP  . atorvastatin (LIPITOR) 80 MG tablet Take 1/2  tablet daily as directed for cholesterol  . Cholecalciferol (VITAMIN D PO) Take 5,000 Units by mouth 2 (two) times daily.   Marland Kitchen lisinopril (PRINIVIL,ZESTRIL) 20 MG tablet TAKE 1 TABLET BY MOUTH IN  THE EVENING FOR BLOOD  PRESSURE  . meloxicam (MOBIC) 15 MG tablet Take 1/2 to 1 tablet daily with food for Pain / Inflammation & Limit to 5 days/week to prevent Kidney Damage  . metFORMIN (GLUCOPHAGE-XR) 500 MG 24 hr tablet Take 1 tablet with Breakfast & Lunch & 2 tablets with Supper for Diabetes  . Multiple Vitamin (MULTIVITAMIN) tablet Take 1 tablet by mouth daily.  Marland Kitchen omeprazole (PRILOSEC) 40 MG capsule Take 1 capsule every morning for Acid Reflux  . verapamil (CALAN) 120 MG tablet Take 1 tablet 2 x /day for BP  . vitamin B-12 (CYANOCOBALAMIN) 1000 MCG tablet Take 1,000 mcg by mouth daily.  . Calcium Citrate (CALCITRATE PO) Take 500 mg by mouth daily.  Marland Kitchen MILK THISTLE PO Take by mouth 2 (two) times daily.   No current facility-administered medications on file prior to visit.    Allergies  Allergen Reactions  . Diltiazem    Past Medical History:  Diagnosis Date  . DDD (degenerative disc disease), lumbar   . Fatty liver disease, nonalcoholic    CT AB 6578  . Hyperlipidemia   .  Hypertension   . Type II or unspecified type diabetes mellitus without mention of complication, not stated as uncontrolled   . Vitamin D deficiency    Health Maintenance  Topic Date Due  . INFLUENZA VACCINE  02/13/2019  . OPHTHALMOLOGY EXAM  02/18/2019  . HEMOGLOBIN A1C  03/20/2019  . FOOT EXAM  12/14/2019  . TETANUS/TDAP  07/09/2021  . DEXA SCAN  Completed  . PNA vac Low Risk Adult  Completed   Immunization History  Administered Date(s) Administered  . H1N1 06/28/2008  . Influenza Whole 05/04/2013  . Influenza, High Dose Seasonal  PF 04/26/2014, 04/26/2015, 04/01/2016, 05/07/2017  . Influenza-Unspecified 04/14/2018  . Pneumococcal Conjugate-13 04/26/2014  . Pneumococcal Polysaccharide-23 07/10/2011  . Td 07/10/2011  . Zoster 06/28/2008   Last Colon - 08/2003 - Dr Delfin Edis - Diverticulosis   Last MGM - 2003 - Bilat sub-cutaneous ressections  Past Surgical History:  Procedure Laterality Date  . APPENDECTOMY    . CATARACT EXTRACTION, BILATERAL Bilateral 09/2017   Dr. Gershon Crane  . KNEE ARTHROSCOPY Left   . MASTECTOMY Bilateral    30 years ago   Family History  Problem Relation Age of Onset  . Stroke Mother   . Heart disease Mother   . Heart disease Father   . Heart attack Father   . Heart attack Sister    Social History   Tobacco Use  . Smoking status: Former Smoker    Last attempt to quit: 07/15/1993    Years since quitting: 25.4  . Smokeless tobacco: Never Used  Substance Use Topics  . Alcohol use: No  . Drug use: No    ROS Constitutional: Denies fever, chills, weight loss/gain, headaches, insomnia,  night sweats, and change in appetite. Does c/o fatigue. Eyes: Denies redness, blurred vision, diplopia, discharge, itchy, watery eyes.  ENT: Denies discharge, congestion, post nasal drip, epistaxis, sore throat, earache, hearing loss, dental pain, Tinnitus, Vertigo, Sinus pain, snoring.  Cardio: Denies chest pain, palpitations, irregular heartbeat, syncope, dyspnea, diaphoresis, orthopnea, PND, claudication, edema Respiratory: denies cough, dyspnea, DOE, pleurisy, hoarseness, laryngitis, wheezing.  Gastrointestinal: Denies dysphagia, heartburn, reflux, water brash, pain, cramps, nausea, vomiting, bloating, diarrhea, constipation, hematemesis, melena, hematochezia, jaundice, hemorrhoids Genitourinary: Denies dysuria, frequency, urgency, nocturia, hesitancy, discharge, hematuria, flank pain Breast: Breast lumps, nipple discharge, bleeding.  Musculoskeletal: Denies arthralgia, myalgia, stiffness, Jt.  Swelling, pain, limp, and strain/sprain. Denies falls. Skin: Denies puritis, rash, hives, warts, acne, eczema, changing in skin lesion Neuro: No weakness, tremor, incoordination, spasms, paresthesia, pain Psychiatric: Denies confusion, memory loss, sensory loss. Denies Depression. Endocrine: Denies change in weight, skin, hair change, nocturia, and paresthesia, diabetic polys, visual blurring, hyper / hypo glycemic episodes.  Heme/Lymph: No excessive bleeding, bruising, enlarged lymph nodes.  Physical Exam  BP 138/86   Pulse 72   Temp (!) 97.3 F (36.3 C)   Resp 16   Ht 5\' 3"  (1.6 m)   Wt 137 lb (62.1 kg)   BMI 24.27 kg/m   General Appearance: Well nourished, well groomed and in no apparent distress.  Eyes: PERRLA, EOMs, conjunctiva no swelling or erythema, normal fundi and vessels. Sinuses: No frontal/maxillary tenderness ENT/Mouth: EACs patent / TMs  nl. Nares clear without erythema, swelling, mucoid exudates. Oral hygiene is good. No erythema, swelling, or exudate. Tongue normal, non-obstructing. Tonsils not swollen or erythematous. Hearing normal.  Neck: Supple, thyroid not palpable. No bruits, nodes or JVD. Respiratory: Respiratory effort normal.  BS equal and clear bilateral without rales, rhonci, wheezing or stridor. Cardio: Heart sounds  are normal with regular rate and rhythm and no murmurs, rubs or gallops. Peripheral pulses are normal and equal bilaterally without edema. No aortic or femoral bruits. Chest: symmetric with normal excursions and percussion. Breasts: Symmetric, without lumps, nipple discharge, retractions, or fibrocystic changes.  Abdomen: Flat, soft with bowel sounds active. Nontender, no guarding, rebound, hernias, masses, or organomegaly.  Lymphatics: Non tender without lymphadenopathy.  Genitourinary:  Musculoskeletal: Full ROM all peripheral extremities, joint stability, 5/5 strength, and normal gait. Skin: Warm and dry without rashes, lesions, cyanosis,  clubbing or  ecchymosis.  Neuro: Cranial nerves intact, reflexes equal bilaterally. Normal muscle tone, no cerebellar symptoms. Sensation intact to touch, vibratory and Monofilament to the toes bilaterally. Pysch: Alert and oriented X 3, normal affect, Insight and Judgment appropriate.   Assessment and Plan  1. Annual Preventative Screening Examination  2. Essential hypertension  - EKG 12-Lead - Urinalysis, Routine w reflex microscopic - Microalbumin / creatinine urine ratio - CBC with Differential/Platelet - COMPLETE METABOLIC PANEL WITH GFR - Magnesium - TSH  3. Hyperlipidemia, mixed  - EKG 12-Lead - Lipid panel - TSH  4. Type 2 diabetes mellitus with stage 2 chronic kidney disease, without long-term current use of insulin (HCC)  - EKG 12-Lead - Urinalysis, Routine w reflex microscopic - Microalbumin / creatinine urine ratio - HM DIABETES FOOT EXAM - LOW EXTREMITY NEUR EXAM DOCUM - Hemoglobin A1c - Insulin, random  5. Vitamin D deficiency  - VITAMIN D 25 Hydroxyl  6. Gastroesophageal reflux disease  - CBC with Differential/Platelet  7. Screening for colorectal cancer  - POC Hemoccult Bld/Stl   8. Screening for ischemic heart disease  - EKG 12-Lead  9. FHx: heart disease  - EKG 12-Lead  10. Former smoker  - EKG 12-Lead  11. Medication management  - Urinalysis, Routine w reflex microscopic - Microalbumin / creatinine urine ratio - CBC with Differential/Platelet - COMPLETE METABOLIC PANEL WITH GFR - Magnesium - Lipid panel - TSH - Hemoglobin A1c - Insulin, random - VITAMIN D 25 Hydroxyl        Patient was counseled in prudent diet to achieve/maintain BMI less than 25 for weight control, BP monitoring, regular exercise and medications. Discussed med's effects and SE's. Screening labs and tests as requested with regular follow-up as recommended. Over 40 minutes of exam, counseling, chart review and high complex critical decision making was  performed.

## 2018-12-14 NOTE — Patient Instructions (Signed)
- Vit D  And Vit C 1,000 mg  are recommended to help protect  against the Covid_19 and other Corona viruses.   - Also it's recommended to take Zinc 50 mg to help  protect against the Covid_19  And best place to get  is also on [employer] and don't pay more than 6-8 cents /pill !   ================================ Coronavirus (COVID-19) Are you at risk?  Are you at risk for the Coronavirus (COVID-19)?  To be considered HIGH RISK for Coronavirus (COVID-19), you have to meet the following criteria:  . Traveled to [place], [place], [place], [place] or [place]; or in the [place] to [place], [place], [place]  . or [place]; and have fever, cough, and shortness of breath within the last 2 weeks of travel OR . Been in close contact with a person diagnosed with COVID-19 within the last 2 weeks and have  . fever, cough,and shortness of breath .  . IF YOU DO NOT MEET THESE CRITERIA, YOU ARE CONSIDERED LOW RISK FOR COVID-19.  What to do if you are HIGH RISK for COVID-19?  Marland Kitchen If you are having a medical emergency, call 911. . Seek medical care right away. Before you go to a doctor's office, urgent care or emergency department, .  call ahead and tell them about your recent travel, contact with someone diagnosed with COVID-19  .  and your symptoms.  . You should receive instructions from your physician's office regarding next steps of care.  . When you arrive at healthcare provider, tell the healthcare staff immediately you have returned from  . visiting [place], [place], [place], [place] or [place]; or traveled in the [place] to [place], [place],  . [place] or [place] in the last two weeks or you have been in close contact with a person diagnosed with  . COVID-19 in the last 2 weeks.   . Tell the health care staff about your symptoms: fever, cough and shortness of breath. . After you have been seen by a medical provider, you will be either: o Tested for (COVID-19) and  discharged home on quarantine except to seek medical care if  o symptoms worsen, and asked to  - Stay home and avoid contact with others until you get your results (4-5 days)  - Avoid travel on public transportation if possible (such as bus, train, or airplane) or o Sent to the Emergency Department by EMS for evaluation, COVID-19 testing  and  o possible admission depending on your condition and test results.  What to do if you are LOW RISK for COVID-19?  Reduce your risk of any infection by using the same precautions used for avoiding the common cold or flu:  Marland Kitchen Wash your hands often with soap and warm water for at least 20 seconds.  If soap and water are not readily available,  . use an alcohol-based hand sanitizer with at least 60% alcohol.  . If coughing or sneezing, cover your mouth and nose by coughing or sneezing into the elbow areas of your shirt or coat, .  into a tissue or into your sleeve (not your hands). . Avoid shaking hands with others and consider head nods or verbal greetings only. . Avoid touching your eyes, nose, or mouth with unwashed hands.  . Avoid close contact with people who are sick. . Avoid places or events with large numbers of people in one location, like concerts or sporting events. . Carefully consider travel plans  you have or are making. . If you are planning any travel outside or inside the [place], visit the CDC's Travelers' Health webpage for the latest health notices. . If you have some symptoms but not all symptoms, continue to monitor at home and seek medical attention  . if your symptoms worsen. . If you are having a medical emergency, call 911.   . >>>>>>>>>>>>>>>>>>>>>>>>>>>>>>>>> . We Do NOT Approve of  [facility], [employer]  Our Patients  To Do Home Visits & We Do NOT Approve of [employer] > > > > > > > > > > > > > > > > > > > > > > > > > > > > > > > > > > > > > > >  Preventive Care for Adults  A healthy lifestyle  and preventive care can promote health and wellness. Preventive health guidelines for women include the following key practices.  A routine yearly physical is a good way to check with your health care provider about your health and preventive screening. It is a chance to share any concerns and updates on your health and to receive a thorough exam.  Visit your dentist for a routine exam and preventive care every 6 months. Brush your teeth twice a day and floss once a day. Good oral hygiene prevents tooth decay and gum disease.  The frequency of eye exams is based on your age, health, family medical history, use of contact lenses, and other factors. Follow your health care provider's recommendations for frequency of eye exams.  Eat a healthy diet. Foods like vegetables, fruits, whole grains, low-fat dairy products, and lean protein foods contain the nutrients you need without too many calories. Decrease your intake of foods high in solid fats, added sugars, and salt. Eat the right amount of calories for you. Get information about a proper diet from your health care provider, if necessary.  Regular physical exercise is one of the most important things you can do for your health. Most adults should get at least 150 minutes of moderate-intensity exercise (any activity that increases your heart rate and causes you to sweat) each week. In addition, most adults need muscle-strengthening exercises on 2 or more days a week.  Maintain a healthy weight. The body mass index (BMI) is a screening tool to identify possible weight problems. It provides an estimate of body fat based on height and weight. Your health care provider can find your BMI and can help you achieve or maintain a healthy weight. For adults 20 years and older:  A BMI below 18.5 is considered underweight.  A BMI of 18.5 to 24.9 is normal.  A BMI of 25 to 29.9 is considered overweight.  A BMI of 30 and above is considered obese.  Maintain  normal blood lipids and cholesterol levels by exercising and minimizing your intake of saturated fat. Eat a balanced diet with plenty of fruit and vegetables. If your lipid or cholesterol levels are high, you are over 50, or you are at high risk for heart disease, you may need your cholesterol levels checked more frequently. Ongoing high lipid and cholesterol levels should be treated with medicines if diet and exercise are not working.  If you smoke, find out from your health care provider how to quit. If you do not use tobacco, do not start.  Lung cancer screening is recommended for adults aged 58-80 years who are at high risk for developing lung cancer because of a history  of smoking. A yearly low-dose CT scan of the lungs is recommended for people who have at least a 30-pack-year history of smoking and are a current smoker or have quit within the past 15 years. A pack year of smoking is smoking an average of 1 pack of cigarettes a day for 1 year (for example: 1 pack a day for 30 years or 2 packs a day for 15 years). Yearly screening should continue until the smoker has stopped smoking for at least 15 years. Yearly screening should be stopped for people who develop a health problem that would prevent them from having lung cancer treatment.  Avoid use of street drugs. Do not share needles with anyone. Ask for help if you need support or instructions about stopping the use of drugs.  High blood pressure causes heart disease and increases the risk of stroke.  Ongoing high blood pressure should be treated with medicines if weight loss and exercise do not work.  If you are 27-58 years old, ask your health care provider if you should take aspirin to prevent strokes.  Diabetes screening involves taking a blood sample to check your fasting blood sugar level. This should be done once every 3 years, after age 73, if you are within normal weight and without risk factors for diabetes. Testing should be considered  at a younger age or be carried out more frequently if you are overweight and have at least 1 risk factor for diabetes.  Breast cancer screening is essential preventive care for women. You should practice "breast self-awareness." This means understanding the normal appearance and feel of your breasts and may include breast self-examination. Any changes detected, no matter how small, should be reported to a health care provider. Women in their 43s and 30s should have a clinical breast exam (CBE) by a health care provider as part of a regular health exam every 1 to 3 years. After age 4, women should have a CBE every year. Starting at age 77, women should consider having a mammogram (breast X-ray test) every year. Women who have a family history of breast cancer should talk to their health care provider about genetic screening. Women at a high risk of breast cancer should talk to their health care providers about having an MRI and a mammogram every year.  Breast cancer gene (BRCA)-related cancer risk assessment is recommended for women who have family members with BRCA-related cancers. BRCA-related cancers include breast, ovarian, tubal, and peritoneal cancers. Having family members with these cancers may be associated with an increased risk for harmful changes (mutations) in the breast cancer genes BRCA1 and BRCA2. Results of the assessment will determine the need for genetic counseling and BRCA1 and BRCA2 testing.  Routine pelvic exams to screen for cancer are no longer recommended for nonpregnant women who are considered low risk for cancer of the pelvic organs (ovaries, uterus, and vagina) and who do not have symptoms. Ask your health care provider if a screening pelvic exam is right for you.  If you have had past treatment for cervical cancer or a condition that could lead to cancer, you need Pap tests and screening for cancer for at least 20 years after your treatment. If Pap tests have been discontinued,  your risk factors (such as having a new sexual partner) need to be reassessed to determine if screening should be resumed. Some women have medical problems that increase the chance of getting cervical cancer. In these cases, your health care provider may recommend more  frequent screening and Pap tests.    Colorectal cancer can be detected and often prevented. Most routine colorectal cancer screening begins at the age of 50 years and continues through age 71 years. However, your health care provider may recommend screening at an earlier age if you have risk factors for colon cancer. On a yearly basis, your health care provider may provide home test kits to check for hidden blood in the stool. Use of a small camera at the end of a tube, to directly examine the colon (sigmoidoscopy or colonoscopy), can detect the earliest forms of colorectal cancer. Talk to your health care provider about this at age 19, when routine screening begins.  Direct exam of the colon should be repeated every 5-10 years through age 50 years, unless early forms of pre-cancerous polyps or small growths are found.  Osteoporosis is a disease in which the bones lose minerals and strength with aging. This can result in serious bone fractures or breaks. The risk of osteoporosis can be identified using a bone density scan. Women ages 65 years and over and women at risk for fractures or osteoporosis should discuss screening with their health care providers. Ask your health care provider whether you should take a calcium supplement or vitamin D to reduce the rate of osteoporosis.  Menopause can be associated with physical symptoms and risks. Hormone replacement therapy is available to decrease symptoms and risks. You should talk to your health care provider about whether hormone replacement therapy is right for you.  Use sunscreen. Apply sunscreen liberally and repeatedly throughout the day. You should seek shade when your shadow is shorter  than you. Protect yourself by wearing long sleeves, pants, a wide-brimmed hat, and sunglasses year round, whenever you are outdoors.  Once a month, do a whole body skin exam, using a mirror to look at the skin on your back. Tell your health care provider of new moles, moles that have irregular borders, moles that are larger than a pencil eraser, or moles that have changed in shape or color.  Stay current with required vaccines (immunizations).  Influenza vaccine. All adults should be immunized every year.  Tetanus, diphtheria, and acellular pertussis (Td, Tdap) vaccine. Pregnant women should receive 1 dose of Tdap vaccine during each pregnancy. The dose should be obtained regardless of the length of time since the last dose. Immunization is preferred during the 27th-36th week of gestation. An adult who has not previously received Tdap or who does not know her vaccine status should receive 1 dose of Tdap. This initial dose should be followed by tetanus and diphtheria toxoids (Td) booster doses every 10 years. Adults with an unknown or incomplete history of completing a 3-dose immunization series with Td-containing vaccines should begin or complete a primary immunization series including a Tdap dose. Adults should receive a Td booster every 10 years.    Zoster vaccine. One dose is recommended for adults aged 32 years or older unless certain conditions are present.    Pneumococcal 13-valent conjugate (PCV13) vaccine. When indicated, a person who is uncertain of her immunization history and has no record of immunization should receive the PCV13 vaccine. An adult aged 50 years or older who has certain medical conditions and has not been previously immunized should receive 1 dose of PCV13 vaccine. This PCV13 should be followed with a dose of pneumococcal polysaccharide (PPSV23) vaccine. The PPSV23 vaccine dose should be obtained at least 1 or more year(s) after the dose of PCV13 vaccine. An adult  aged 24  years or older who has certain medical conditions and previously received 1 or more doses of PPSV23 vaccine should receive 1 dose of PCV13. The PCV13 vaccine dose should be obtained 1 or more years after the last PPSV23 vaccine dose.    Pneumococcal polysaccharide (PPSV23) vaccine. When PCV13 is also indicated, PCV13 should be obtained first. All adults aged 74 years and older should be immunized. An adult younger than age 85 years who has certain medical conditions should be immunized. Any person who resides in a nursing home or long-term care facility should be immunized. An adult smoker should be immunized. People with an immunocompromised condition and certain other conditions should receive both PCV13 and PPSV23 vaccines. People with human immunodeficiency virus (HIV) infection should be immunized as soon as possible after diagnosis. Immunization during chemotherapy or radiation therapy should be avoided. Routine use of PPSV23 vaccine is not recommended for American Indians, [place] Natives, or people younger than 65 years unless there are medical conditions that require PPSV23 vaccine. When indicated, people who have unknown immunization and have no record of immunization should receive PPSV23 vaccine. One-time revaccination 5 years after the first dose of PPSV23 is recommended for people aged 19-64 years who have chronic kidney failure, nephrotic syndrome, asplenia, or immunocompromised conditions. People who received 1-2 doses of PPSV23 before age 10 years should receive another dose of PPSV23 vaccine at age 31 years or later if at least 5 years have passed since the previous dose. Doses of PPSV23 are not needed for people immunized with PPSV23 at or after age 76 years.   Preventive Services / Frequency  Ages 30 years and over  Blood pressure check.  Lipid and cholesterol check.  Lung cancer screening. / Every year if you are aged 72-80 years and have a 30-pack-year history of smoking and  currently smoke or have quit within the past 15 years. Yearly screening is stopped once you have quit smoking for at least 15 years or develop a health problem that would prevent you from having lung cancer treatment.  Clinical breast exam.** / Every year after age 25 years.   BRCA-related cancer risk assessment.** / For women who have family members with a BRCA-related cancer (breast, ovarian, tubal, or peritoneal cancers).  Mammogram.** / Every year beginning at age 29 years and continuing for as long as you are in good health. Consult with your health care provider.  Pap test.** / Every 3 years starting at age 79 years through age 74 or 44 years with 3 consecutive normal Pap tests. Testing can be stopped between 65 and 70 years with 3 consecutive normal Pap tests and no abnormal Pap or HPV tests in the past 10 years.  Fecal occult blood test (FOBT) of stool. / Every year beginning at age 43 years and continuing until age 42 years. You may not need to do this test if you get a colonoscopy every 10 years.  Flexible sigmoidoscopy or colonoscopy.** / Every 5 years for a flexible sigmoidoscopy or every 10 years for a colonoscopy beginning at age 34 years and continuing until age 35 years.  Hepatitis C blood test.** / For all people born from [birth year] through 1963 and any individual with known risks for hepatitis C.  Osteoporosis screening.** / A one-time screening for women ages 58 years and over and women at risk for fractures or osteoporosis.  Skin self-exam. / Monthly.  Influenza vaccine. / Every year.  Tetanus, diphtheria, and acellular pertussis (Tdap/Td) vaccine.** /  1 dose of Td every 10 years.  Zoster vaccine.** / 1 dose for adults aged 60 years or older.  Pneumococcal 13-valent conjugate (PCV13) vaccine.** / Consult your health care provider.  Pneumococcal polysaccharide (PPSV23) vaccine.** / 1 dose for all adults aged 65 years and older. Screening for abdominal aortic aneurysm (AAA)   by ultrasound is recommended for people who have history of high blood pressure or who are current or former smokers. ++++++++++++++++++++ Recommend Adult Low Dose Aspirin or  coated  Aspirin 81 mg daily  To reduce risk of Colon Cancer 40 %,  Skin Cancer 26 % ,  Melanoma 46%  and  Pancreatic cancer 60% ++++++++++++++++++++ Vitamin D goal  is between 70-100.  Please make sure that you are taking your Vitamin D as directed.  It is very important as a natural anti-inflammatory  helping hair, skin, and nails, as well as reducing stroke and heart attack risk.  It helps your bones and helps with mood. It also decreases numerous cancer risks so please take it as directed.  Low Vit D is associated with a 200-300% higher risk for CANCER  and 200-300% higher risk for HEART   ATTACK  &  STROKE.   ...................................... It is also associated with higher death rate at younger ages,  autoimmune diseases like Rheumatoid arthritis, Lupus, Multiple Sclerosis.    Also many other serious conditions, like depression, Alzheimer's Dementia, infertility, muscle aches, fatigue, fibromyalgia - just to name a few. ++++++++++++++++++ Recommend the book "The END of DIETING" by Dr Joel Fuhrman  & the book "The END of DIABETES " by Dr Joel Fuhrman At [employer] - get book & Audio CD's    Being diabetic has a  300% increased risk for heart attack, stroke, cancer, and alzheimer- type vascular dementia. It is very important that you work harder with diet by avoiding all foods that are white. Avoid white rice (brown & wild rice is OK), white potatoes (sweetpotatoes in moderation is OK), White bread or wheat bread or anything made out of white flour like bagels, donuts, rolls, buns, biscuits, cakes, pastries, cookies, pizza crust, and pasta (made from white flour & egg whites) - vegetarian pasta or spinach or wheat pasta is OK. Multigrain breads like Arnold's or [employer], or multigrain sandwich thins  or flatbreads.  Diet, exercise and weight loss can reverse and cure diabetes in the early stages.  Diet, exercise and weight loss is very important in the control and prevention of complications of diabetes which affects every system in your body, ie. Brain - dementia/stroke, eyes - glaucoma/blindness, heart - heart attack/heart failure, kidneys - dialysis, stomach - gastric paralysis, intestines - malabsorption, nerves - severe painful neuritis, circulation - gangrene & loss of a leg(s), and finally cancer and Alzheimers.    I recommend avoid fried & greasy foods,  sweets/candy, white rice (brown or wild rice or Quinoa is OK), white potatoes (sweet potatoes are OK) - anything made from white flour - bagels, doughnuts, rolls, buns, biscuits,white and wheat breads, pizza crust and traditional pasta made of white flour & egg white(vegetarian pasta or spinach or wheat pasta is OK).  Multi-grain bread is OK - like multi-grain flat bread or sandwich thins. Avoid alcohol in excess. Exercise is also important.    Eat all the vegetables you want - avoid meat, especially red meat and dairy - especially cheese.  Cheese is the most concentrated form of trans-fats which is the worst thing to clog up our arteries. Veggie   cheese is OK which can be found in the fresh produce section at [employer] or [employer] or [employer]  +++++++++++++++++++ DASH Eating Plan  DASH stands for "Dietary Approaches to Stop Hypertension."   The DASH eating plan is a healthy eating plan that has been shown to reduce high blood pressure (hypertension). Additional health benefits may include reducing the risk of type 2 diabetes mellitus, heart disease, and stroke. The DASH eating plan may also help with weight loss. WHAT DO I NEED TO KNOW ABOUT THE DASH EATING PLAN? For the DASH eating plan, you will follow these general guidelines:  Choose foods with a percent daily value for sodium of less than 5% (as listed on the food  label).  Use salt-free seasonings or herbs instead of table salt or sea salt.  Check with your health care provider or pharmacist before using salt substitutes.  Eat lower-sodium products, often labeled as "lower sodium" or "no salt added."  Eat fresh foods.  Eat more vegetables, fruits, and low-fat dairy products.  Choose whole grains. Look for the word "whole" as the first word in the ingredient list.  Choose fish   Limit sweets, desserts, sugars, and sugary drinks.  Choose heart-healthy fats.  Eat veggie cheese   Eat more home-cooked food and less restaurant, buffet, and fast food.  Limit fried foods.  Cook foods using methods other than frying.  Limit canned vegetables. If you do use them, rinse them well to decrease the sodium.  When eating at a restaurant, ask that your food be prepared with less salt, or no salt if possible.                      WHAT FOODS CAN I EAT? Read Dr Fara Olden Fuhrman's books on The End of Dieting & The End of Diabetes  Grains Whole grain or whole wheat bread. Brown rice. Whole grain or whole wheat pasta. Quinoa, bulgur, and whole grain cereals. Low-sodium cereals. Corn or whole wheat flour tortillas. Whole grain cornbread. Whole grain crackers. Low-sodium crackers.  Vegetables Fresh or frozen vegetables (raw, steamed, roasted, or grilled). Low-sodium or reduced-sodium tomato and vegetable juices. Low-sodium or reduced-sodium tomato sauce and paste. Low-sodium or reduced-sodium canned vegetables.   Fruits All fresh, canned (in natural juice), or frozen fruits.  Protein Products  All fish and seafood.  Dried beans, peas, or lentils. Unsalted nuts and seeds. Unsalted canned beans.  Dairy Low-fat dairy products, such as skim or 1% milk, 2% or reduced-fat cheeses, low-fat ricotta or cottage cheese, or plain low-fat yogurt. Low-sodium or reduced-sodium cheeses.  Fats and Oils Tub margarines without trans fats. Light or reduced-fat mayonnaise  and salad dressings (reduced sodium). Avocado. Safflower, olive, or canola oils. Natural peanut or almond butter.  Other Unsalted popcorn and pretzels. The items listed above may not be a complete list of recommended foods or beverages. Contact your dietitian for more options.  +++++++++++++++  WHAT FOODS ARE NOT RECOMMENDED? Grains/ White flour or wheat flour White bread. White pasta. White rice. Refined cornbread. Bagels and croissants. Crackers that contain trans fat.  Vegetables  Creamed or fried vegetables. Vegetables in a . Regular canned vegetables. Regular canned tomato sauce and paste. Regular tomato and vegetable juices.  Fruits Dried fruits. Canned fruit in light or heavy syrup. Fruit juice.  Meat and Other Protein Products Meat in general - RED meat & White meat.  Fatty cuts of meat. Ribs, chicken wings, all processed meats as bacon, sausage, bologna, salami,  fatback, hot dogs, bratwurst and packaged luncheon meats.  Dairy Whole or 2% milk, cream, half-and-half, and cream cheese. Whole-fat or sweetened yogurt. Full-fat cheeses or blue cheese. Non-dairy creamers and whipped toppings. Processed cheese, cheese spreads, or cheese curds.  Condiments Onion and garlic salt, seasoned salt, table salt, and sea salt. Canned and packaged gravies. Worcestershire sauce. Tartar sauce. Barbecue sauce. Teriyaki sauce. Soy sauce, including reduced sodium. Steak sauce. Fish sauce. Oyster sauce. Cocktail sauce. Horseradish. Ketchup and mustard. Meat flavorings and tenderizers. Bouillon cubes. Hot sauce. Tabasco sauce. Marinades. Taco seasonings. Relishes.  Fats and Oils Butter, stick margarine, lard, shortening and bacon fat. Coconut, palm kernel, or palm oils. Regular salad dressings.  Pickles and olives. Salted popcorn and pretzels.  The items listed above may not be a complete list of foods and beverages to avoid.

## 2018-12-15 ENCOUNTER — Other Ambulatory Visit: Payer: Self-pay

## 2018-12-15 ENCOUNTER — Ambulatory Visit (INDEPENDENT_AMBULATORY_CARE_PROVIDER_SITE_OTHER): Payer: PPO | Admitting: Internal Medicine

## 2018-12-15 ENCOUNTER — Encounter: Payer: Self-pay | Admitting: Internal Medicine

## 2018-12-15 VITALS — BP 138/86 | HR 72 | Temp 97.3°F | Resp 16 | Ht 63.0 in | Wt 137.0 lb

## 2018-12-15 DIAGNOSIS — N182 Chronic kidney disease, stage 2 (mild): Secondary | ICD-10-CM | POA: Diagnosis not present

## 2018-12-15 DIAGNOSIS — Z79899 Other long term (current) drug therapy: Secondary | ICD-10-CM

## 2018-12-15 DIAGNOSIS — Z136 Encounter for screening for cardiovascular disorders: Secondary | ICD-10-CM

## 2018-12-15 DIAGNOSIS — I10 Essential (primary) hypertension: Secondary | ICD-10-CM

## 2018-12-15 DIAGNOSIS — E782 Mixed hyperlipidemia: Secondary | ICD-10-CM | POA: Diagnosis not present

## 2018-12-15 DIAGNOSIS — Z0000 Encounter for general adult medical examination without abnormal findings: Secondary | ICD-10-CM | POA: Diagnosis not present

## 2018-12-15 DIAGNOSIS — Z1211 Encounter for screening for malignant neoplasm of colon: Secondary | ICD-10-CM

## 2018-12-15 DIAGNOSIS — E1122 Type 2 diabetes mellitus with diabetic chronic kidney disease: Secondary | ICD-10-CM | POA: Diagnosis not present

## 2018-12-15 DIAGNOSIS — Z87891 Personal history of nicotine dependence: Secondary | ICD-10-CM

## 2018-12-15 DIAGNOSIS — Z8249 Family history of ischemic heart disease and other diseases of the circulatory system: Secondary | ICD-10-CM

## 2018-12-15 DIAGNOSIS — K219 Gastro-esophageal reflux disease without esophagitis: Secondary | ICD-10-CM

## 2018-12-15 DIAGNOSIS — Z0001 Encounter for general adult medical examination with abnormal findings: Secondary | ICD-10-CM

## 2018-12-15 DIAGNOSIS — E559 Vitamin D deficiency, unspecified: Secondary | ICD-10-CM

## 2018-12-16 LAB — COMPLETE METABOLIC PANEL WITH GFR
AG Ratio: 2.5 (calc) (ref 1.0–2.5)
ALT: 24 U/L (ref 6–29)
AST: 23 U/L (ref 10–35)
Albumin: 4.7 g/dL (ref 3.6–5.1)
Alkaline phosphatase (APISO): 41 U/L (ref 37–153)
BUN/Creatinine Ratio: 41 (calc) — ABNORMAL HIGH (ref 6–22)
BUN: 28 mg/dL — ABNORMAL HIGH (ref 7–25)
CO2: 25 mmol/L (ref 20–32)
Calcium: 9.4 mg/dL (ref 8.6–10.4)
Chloride: 104 mmol/L (ref 98–110)
Creat: 0.69 mg/dL (ref 0.60–0.88)
GFR, Est African American: 95 mL/min/{1.73_m2} (ref >=60)
GFR, Est Non African American: 82 mL/min/{1.73_m2} (ref >=60)
Globulin: 1.9 g/dL (calc) (ref 1.9–3.7)
Glucose, Bld: 92 mg/dL (ref 65–99)
Potassium: 3.8 mmol/L (ref 3.5–5.3)
Sodium: 138 mmol/L (ref 135–146)
Total Bilirubin: 1.1 mg/dL (ref 0.2–1.2)
Total Protein: 6.6 g/dL (ref 6.1–8.1)

## 2018-12-16 LAB — LIPID PANEL
Cholesterol: 129 mg/dL (ref <200)
HDL: 41 mg/dL — ABNORMAL LOW (ref >=50)
LDL Cholesterol (Calc): 68 mg/dL (calc)
Non-HDL Cholesterol (Calc): 88 mg/dL (calc) (ref <130)
Total CHOL/HDL Ratio: 3.1 (calc) (ref <5.0)
Triglycerides: 117 mg/dL (ref <150)

## 2018-12-16 LAB — HEMOGLOBIN A1C
Hgb A1c MFr Bld: 5 % of total Hgb (ref <5.7)
Mean Plasma Glucose: 97 (calc)
eAG (mmol/L): 5.4 (calc)

## 2018-12-16 LAB — URINALYSIS, ROUTINE W REFLEX MICROSCOPIC
Bilirubin Urine: NEGATIVE
Glucose, UA: NEGATIVE
Hgb urine dipstick: NEGATIVE
Leukocytes,Ua: NEGATIVE
Nitrite: NEGATIVE
Protein, ur: NEGATIVE
Specific Gravity, Urine: 1.022 (ref 1.001–1.03)
pH: 6.5 (ref 5.0–8.0)

## 2018-12-16 LAB — CBC WITH DIFFERENTIAL/PLATELET
Absolute Monocytes: 674 cells/uL (ref 200–950)
Basophils Absolute: 32 cells/uL (ref 0–200)
Basophils Relative: 0.5 %
Eosinophils Absolute: 233 cells/uL (ref 15–500)
Eosinophils Relative: 3.7 %
HCT: 42 % (ref 35.0–45.0)
Hemoglobin: 13.8 g/dL (ref 11.7–15.5)
Lymphs Abs: 1462 cells/uL (ref 850–3900)
MCH: 32.3 pg (ref 27.0–33.0)
MCHC: 32.9 g/dL (ref 32.0–36.0)
MCV: 98.4 fL (ref 80.0–100.0)
MPV: 11.2 fL (ref 7.5–12.5)
Monocytes Relative: 10.7 %
Neutro Abs: 3900 cells/uL (ref 1500–7800)
Neutrophils Relative %: 61.9 %
Platelets: 197 10*3/uL (ref 140–400)
RBC: 4.27 10*6/uL (ref 3.80–5.10)
RDW: 12.8 % (ref 11.0–15.0)
Total Lymphocyte: 23.2 %
WBC: 6.3 10*3/uL (ref 3.8–10.8)

## 2018-12-16 LAB — INSULIN, RANDOM: Insulin: 2.9 u[IU]/mL

## 2018-12-16 LAB — MICROALBUMIN / CREATININE URINE RATIO
Creatinine, Urine: 144 mg/dL (ref 20–275)
Microalb Creat Ratio: 8 mcg/mg creat (ref <30)
Microalb, Ur: 1.1 mg/dL

## 2018-12-16 LAB — VITAMIN D 25 HYDROXY (VIT D DEFICIENCY, FRACTURES): Vit D, 25-Hydroxy: 57 ng/mL (ref 30–100)

## 2018-12-16 LAB — MAGNESIUM: Magnesium: 1.6 mg/dL (ref 1.5–2.5)

## 2018-12-16 LAB — TSH: TSH: 2.36 mIU/L (ref 0.40–4.50)

## 2018-12-25 ENCOUNTER — Encounter: Payer: Self-pay | Admitting: Internal Medicine

## 2019-01-22 DIAGNOSIS — M25511 Pain in right shoulder: Secondary | ICD-10-CM | POA: Diagnosis not present

## 2019-01-27 ENCOUNTER — Other Ambulatory Visit: Payer: Self-pay

## 2019-01-27 DIAGNOSIS — Z1212 Encounter for screening for malignant neoplasm of rectum: Secondary | ICD-10-CM

## 2019-01-27 DIAGNOSIS — Z1211 Encounter for screening for malignant neoplasm of colon: Secondary | ICD-10-CM

## 2019-01-27 LAB — POC HEMOCCULT BLD/STL (HOME/3-CARD/SCREEN)
Card #2 Fecal Occult Blod, POC: NEGATIVE
Card #3 Fecal Occult Blood, POC: NEGATIVE
Fecal Occult Blood, POC: NEGATIVE

## 2019-02-19 ENCOUNTER — Other Ambulatory Visit: Payer: Self-pay | Admitting: Internal Medicine

## 2019-02-19 DIAGNOSIS — I10 Essential (primary) hypertension: Secondary | ICD-10-CM

## 2019-02-19 MED ORDER — VERAPAMIL HCL 120 MG PO TABS: ORAL_TABLET | ORAL | 3 refills | Status: DC

## 2019-02-19 MED ORDER — LISINOPRIL 20 MG PO TABS: ORAL_TABLET | ORAL | 3 refills | Status: DC

## 2019-03-03 ENCOUNTER — Other Ambulatory Visit: Payer: Self-pay | Admitting: Internal Medicine

## 2019-03-03 DIAGNOSIS — I10 Essential (primary) hypertension: Secondary | ICD-10-CM

## 2019-03-03 MED ORDER — ATENOLOL 100 MG PO TABS: ORAL_TABLET | ORAL | 3 refills | Status: DC

## 2019-03-08 DIAGNOSIS — Z961 Presence of intraocular lens: Secondary | ICD-10-CM | POA: Diagnosis not present

## 2019-03-08 DIAGNOSIS — H52203 Unspecified astigmatism, bilateral: Secondary | ICD-10-CM | POA: Diagnosis not present

## 2019-03-08 DIAGNOSIS — H524 Presbyopia: Secondary | ICD-10-CM | POA: Diagnosis not present

## 2019-03-15 DIAGNOSIS — N182 Chronic kidney disease, stage 2 (mild): Secondary | ICD-10-CM | POA: Insufficient documentation

## 2019-03-15 DIAGNOSIS — E1122 Type 2 diabetes mellitus with diabetic chronic kidney disease: Secondary | ICD-10-CM | POA: Insufficient documentation

## 2019-03-15 NOTE — Progress Notes (Signed)
FOLLOW UP  Assessment and Plan:   Hypertension Well controlled with current medications  Monitor blood pressure at home; patient to call if consistently greater than 130/80 Continue DASH diet.   Reminder to go to the ER if any CP, SOB, nausea, dizziness, severe HA, changes vision/speech, left arm numbness and tingling and jaw pain.  Cholesterol Currently at LDL goal; continue statin; consider taper down on dose if A1C remains well controlled off of metformin  Continue low cholesterol diet and exercise.  Check lipid panel.   Diabetes with diabetic chronic kidney disease Recently with normalized A1Cs following med assisted weight loss Has tapered off of metformin and still doing well  Continue diet and exercise.  Perform daily foot/skin check, notify office of any concerning changes.  Check A1C  Overweight Long discussion about weight loss, diet, and exercise Recommended diet heavy in fruits and veggies and low in animal meats, cheeses, and dairy products, appropriate calorie intake Discussed ideal weight for height - would like to maintain <140 lb, monitoring closely at home Patient will work on limiting snacks/sweets, increase intentional exercise Will follow up in 3 months  Vitamin D Def At goal at last visit; continue supplementation to maintain goal of 60-100 Defer Vit D level  GERD Symptoms well managed without breakthrough Will try to get off PPI given info for taper and pepcid sent in   Continue diet and meds as discussed. Further disposition pending results of labs. Discussed med's effects and SE's.   Over 30 minutes of exam, counseling, chart review, and critical decision making was performed.   Future Appointments  Date Time Provider [place]  06/23/2019 11:00 AM Unk Pinto, MD [facility] None  12/31/2019 10:00 AM Unk Pinto, MD [facility] None     ----------------------------------------------------------------------------------------------------------------------  HPI 83 y.o. female  presents for 3 month follow up on hypertension, cholesterol, T2 diabetes, weight and vitamin D deficiency.   she has a diagnosis of GERD which is currently managed by omeprazole 40 mg daily she reports symptoms is currently well controlled, and denies breakthrough reflux, burning in chest, hoarseness or cough.    BMI is Body mass index is 25.4 kg/m., she has been working on diet and exercise. Her weight is down from 168 lb on 06/17/2018 with assistance of phentermine and topamax and has successfully tapered off of diabetes medications. She admits in the last week she has cheated a bit due to her birthday.  Wt Readings from Last 3 Encounters:  03/18/19 143 lb 6.4 oz (65 kg)  12/15/18 137 lb (62.1 kg)  09/17/18 144 lb 6.4 oz (65.5 kg)   Her blood pressure has been controlled at home, today their BP is BP: 136/80  She does not workout. She denies chest pain, shortness of breath, dizziness.   She is on cholesterol medication (atorvastatin 40 mg daily) and denies myalgias. Her LDL cholesterol is at goal. The cholesterol last visit was:   Lab Results  Component Value Date   CHOL 129 12/15/2018   HDL 41 (L) 12/15/2018   LDLCALC 68 12/15/2018   TRIG 117 12/15/2018   CHOLHDL 3.1 12/15/2018    She has not been working on diet and exercise for well controlled T2DM (A1C 6.5% in 2009, was on metformin, tapered off following improved A1Cs with weight loss, currently managed by lfiestyle only), and denies increased appetite, nausea, paresthesia of the feet, polydipsia, polyuria, visual disturbances and vomiting. Last A1C in the office was:  Lab Results  Component Value Date   HGBA1C  5.0 12/15/2018   Lab Results  Component Value Date   GFRNONAA 82 12/15/2018   Patient is on Vitamin D supplement and near goal of 60 at recent check:    Lab Results   Component Value Date   VD25OH 57 12/15/2018       Current Medications:  Current Outpatient Medications on File Prior to Visit  Medication Sig  . aspirin 81 MG tablet Take 81 mg by mouth daily.  Marland Kitchen atenolol (TENORMIN) 100 MG tablet Take 1 tablet Daily for BP  . atorvastatin (LIPITOR) 80 MG tablet Take 1/2  tablet daily as directed for cholesterol  . Bacillus Coagulans-Inulin (PROBIOTIC FORMULA PO) Take by mouth daily.  . Calcium Citrate (CALCITRATE PO) Take 500 mg by mouth daily.  . Cholecalciferol (VITAMIN D PO) Take 5,000 Units by mouth 2 (two) times daily.   Marland Kitchen lisinopril (ZESTRIL) 20 MG tablet Take 1 tablet Daily for BP  . meloxicam (MOBIC) 15 MG tablet Take 1/2 to 1 tablet daily with food for Pain / Inflammation & Limit to 5 days/week to prevent Kidney Damage  . Multiple Vitamin (MULTIVITAMIN) tablet Take 1 tablet by mouth daily.  . Multiple Vitamins-Minerals (HAIR SKIN AND NAILS FORMULA PO) Take by mouth daily.  Marland Kitchen omeprazole (PRILOSEC) 40 MG capsule Take 1 capsule every morning for Acid Reflux  . verapamil (CALAN) 120 MG tablet Take 1 tablet 2 x /day for BP  . vitamin B-12 (CYANOCOBALAMIN) 1000 MCG tablet Take 1,000 mcg by mouth daily.  Marland Kitchen MILK THISTLE PO Take by mouth 2 (two) times daily.   No current facility-administered medications on file prior to visit.      Allergies:  Allergies  Allergen Reactions  . Diltiazem      Medical History:  Past Medical History:  Diagnosis Date  . DDD (degenerative disc disease), lumbar   . Fatty liver disease, nonalcoholic    CT AB 0000000  . Hyperlipidemia   . Hypertension   . Type II or unspecified type diabetes mellitus without mention of complication, not stated as uncontrolled   . Vitamin D deficiency    Family history- Reviewed and unchanged Social history- Reviewed and unchanged   Review of Systems:  Review of Systems  Constitutional: Negative for malaise/fatigue and weight loss.  HENT: Negative for hearing loss and  tinnitus.   Eyes: Negative for blurred vision and double vision.  Respiratory: Negative for cough, shortness of breath and wheezing.   Cardiovascular: Negative for chest pain, palpitations, orthopnea, claudication and leg swelling.  Gastrointestinal: Negative for abdominal pain, blood in stool, constipation, diarrhea, heartburn, melena, nausea and vomiting.  Genitourinary: Negative.   Musculoskeletal: Negative for joint pain and myalgias.  Skin: Negative for rash.  Neurological: Negative for dizziness, tingling, sensory change, weakness and headaches.  Endo/Heme/Allergies: Negative for polydipsia.  Psychiatric/Behavioral: Negative.   All other systems reviewed and are negative.   Physical Exam: BP 136/80   Pulse 61   Temp 98.1 F (36.7 C)   Wt 143 lb 6.4 oz (65 kg)   SpO2 97%   BMI 25.40 kg/m  Wt Readings from Last 3 Encounters:  03/18/19 143 lb 6.4 oz (65 kg)  12/15/18 137 lb (62.1 kg)  09/17/18 144 lb 6.4 oz (65.5 kg)   General Appearance: Well nourished, in no apparent distress. Eyes: PERRLA, EOMs, conjunctiva no swelling or erythema Sinuses: No Frontal/maxillary tenderness ENT/Mouth: Ext aud canals clear, TMs without erythema, bulging. No erythema, swelling, or exudate on post pharynx.  Tonsils not swollen or  erythematous. Hearing normal.  Neck: Supple, thyroid normal.  Respiratory: Respiratory effort normal, BS equal bilaterally without rales, rhonchi, wheezing or stridor.  Cardio: RRR with no MRGs. Brisk peripheral pulses without edema.  Abdomen: Soft, + BS.  Non tender, no guarding, rebound, hernias, masses. Lymphatics: Non tender without lymphadenopathy.  Musculoskeletal: Full ROM, 5/5 strength, Normal gait Skin: Warm, dry without rashes, lesions, ecchymosis.  Neuro: Cranial nerves intact. No cerebellar symptoms.  Psych: Awake and oriented X 3, normal affect, Insight and Judgment appropriate.    Izora Ribas, NP 9:13 AM [facility]

## 2019-03-18 ENCOUNTER — Encounter: Payer: Self-pay | Admitting: Adult Health

## 2019-03-18 ENCOUNTER — Ambulatory Visit (INDEPENDENT_AMBULATORY_CARE_PROVIDER_SITE_OTHER): Payer: PPO | Admitting: Adult Health

## 2019-03-18 ENCOUNTER — Other Ambulatory Visit: Payer: Self-pay

## 2019-03-18 VITALS — BP 136/80 | HR 61 | Temp 98.1°F | Wt 143.4 lb

## 2019-03-18 DIAGNOSIS — I10 Essential (primary) hypertension: Secondary | ICD-10-CM | POA: Diagnosis not present

## 2019-03-18 DIAGNOSIS — E1122 Type 2 diabetes mellitus with diabetic chronic kidney disease: Secondary | ICD-10-CM | POA: Diagnosis not present

## 2019-03-18 DIAGNOSIS — E559 Vitamin D deficiency, unspecified: Secondary | ICD-10-CM

## 2019-03-18 DIAGNOSIS — E119 Type 2 diabetes mellitus without complications: Secondary | ICD-10-CM

## 2019-03-18 DIAGNOSIS — Z6825 Body mass index (BMI) 25.0-25.9, adult: Secondary | ICD-10-CM

## 2019-03-18 DIAGNOSIS — Z79899 Other long term (current) drug therapy: Secondary | ICD-10-CM | POA: Diagnosis not present

## 2019-03-18 DIAGNOSIS — E782 Mixed hyperlipidemia: Secondary | ICD-10-CM

## 2019-03-18 DIAGNOSIS — N182 Chronic kidney disease, stage 2 (mild): Secondary | ICD-10-CM

## 2019-03-18 DIAGNOSIS — K219 Gastro-esophageal reflux disease without esophagitis: Secondary | ICD-10-CM

## 2019-03-18 MED ORDER — FAMOTIDINE 20 MG PO TABS: 20.0000 mg | ORAL_TABLET | Freq: Two times a day (BID) | ORAL | 1 refills | Status: DC | PRN

## 2019-03-18 NOTE — Patient Instructions (Addendum)
GETTING OFF OF PPI's    Nexium/protonix/prilosec/Omeprazole/Dexilant/Aciphex are called PPI's, they are great at healing your stomach but should only be taken for a short period of time.     Recent studies have shown that taken for a long time they  can increase the risk of osteoporosis (weakening of your bones), pneumonia, low magnesium, restless legs, Cdiff (infection that causes diarrhea), DEMENTIA and most recently kidney damage / disease / insufficiency.     Due to this information we want to try to stop the PPI but if you try to stop it abruptly this can cause rebound acid and worsening symptoms.   So this is how we want you to get off the PPI: Generic is always fine!!  - Start taking the nexium/protonix/prilosec/PPI  every other day with  zantac (ranitidine) OR pepcid famotadine 2 x a day for 2-4 weeks - some people stay on this dosage and can not taper off further. Our main goal is to limit the dosage and amount you are taking so if you need to stay on this dose.   - then decrease the PPI to every 3 days while taking the zantac or pepcid  twice a day the other  days for 2-4  Weeks  - then you can try the zantac or pepcid once at night or up to 2 x day as needed.  - you can continue on this once at night or stop all together  - Avoid alcohol, spicy foods, NSAIDS (aleve, ibuprofen) at this time. See foods below.   +++++++++++++++++++++++++++++++++++++++++++  Food Choices for Gastroesophageal Reflux Disease  When you have gastroesophageal reflux disease (GERD), the foods you eat and your eating habits are very important. Choosing the right foods can help ease the discomfort of GERD. WHAT GENERAL GUIDELINES DO I NEED TO FOLLOW?  Choose fruits, vegetables, whole grains, low-fat dairy products, and low-fat meat, fish, and poultry.  Limit fats such as oils, salad dressings, butter, nuts, and avocado.  Keep a food diary to identify foods that cause symptoms.  Avoid foods that cause  reflux. These may be different for different people.  Eat frequent small meals instead of three large meals each day.  Eat your meals slowly, in a relaxed setting.  Limit fried foods.  Cook foods using methods other than frying.  Avoid drinking alcohol.  Avoid drinking large amounts of liquids with your meals.  Avoid bending over or lying down until 2-3 hours after eating.   WHAT FOODS ARE NOT RECOMMENDED? The following are some foods and drinks that may worsen your symptoms:  Vegetables Tomatoes. Tomato juice. Tomato and spaghetti sauce. Chili peppers. Onion and garlic. Horseradish. Fruits Oranges, grapefruit, and lemon (fruit and juice). Meats High-fat meats, fish, and poultry. This includes hot dogs, ribs, ham, sausage, salami, and bacon. Dairy Whole milk and chocolate milk. Sour cream. Cream. Butter. Ice cream. Cream cheese.  Beverages Coffee and tea, with or without caffeine. Carbonated beverages or energy drinks. Condiments Hot sauce. Barbecue sauce.  Sweets/Desserts Chocolate and cocoa. Donuts. Peppermint and spearmint. Fats and Oils High-fat foods, including [place] fries and potato chips. Other Vinegar. Strong spices, such as black pepper, white pepper, red pepper, cayenne, curry powder, cloves, ginger, and chili powder.

## 2019-03-19 ENCOUNTER — Ambulatory Visit: Payer: Self-pay | Admitting: Adult Health

## 2019-03-19 LAB — CBC WITH DIFFERENTIAL/PLATELET
Absolute Monocytes: 685 cells/uL (ref 200–950)
Basophils Absolute: 32 cells/uL (ref 0–200)
Basophils Relative: 0.5 %
Eosinophils Absolute: 154 cells/uL (ref 15–500)
Eosinophils Relative: 2.4 %
HCT: 45.9 % — ABNORMAL HIGH (ref 35.0–45.0)
Hemoglobin: 15.2 g/dL (ref 11.7–15.5)
Lymphs Abs: 1888 cells/uL (ref 850–3900)
MCH: 32.4 pg (ref 27.0–33.0)
MCHC: 33.1 g/dL (ref 32.0–36.0)
MCV: 97.9 fL (ref 80.0–100.0)
MPV: 10.7 fL (ref 7.5–12.5)
Monocytes Relative: 10.7 %
Neutro Abs: 3642 cells/uL (ref 1500–7800)
Neutrophils Relative %: 56.9 %
Platelets: 183 10*3/uL (ref 140–400)
RBC: 4.69 10*6/uL (ref 3.80–5.10)
RDW: 13 % (ref 11.0–15.0)
Total Lymphocyte: 29.5 %
WBC: 6.4 10*3/uL (ref 3.8–10.8)

## 2019-03-19 LAB — COMPLETE METABOLIC PANEL WITH GFR
AG Ratio: 2.2 (calc) (ref 1.0–2.5)
ALT: 22 U/L (ref 6–29)
AST: 26 U/L (ref 10–35)
Albumin: 4.7 g/dL (ref 3.6–5.1)
Alkaline phosphatase (APISO): 53 U/L (ref 37–153)
BUN: 19 mg/dL (ref 7–25)
CO2: 29 mmol/L (ref 20–32)
Calcium: 9.8 mg/dL (ref 8.6–10.4)
Chloride: 102 mmol/L (ref 98–110)
Creat: 0.65 mg/dL (ref 0.60–0.88)
GFR, Est African American: 97 mL/min/{1.73_m2} (ref >=60)
GFR, Est Non African American: 83 mL/min/{1.73_m2} (ref >=60)
Globulin: 2.1 g/dL (calc) (ref 1.9–3.7)
Glucose, Bld: 121 mg/dL — ABNORMAL HIGH (ref 65–99)
Potassium: 4.4 mmol/L (ref 3.5–5.3)
Sodium: 141 mmol/L (ref 135–146)
Total Bilirubin: 1.3 mg/dL — ABNORMAL HIGH (ref 0.2–1.2)
Total Protein: 6.8 g/dL (ref 6.1–8.1)

## 2019-03-19 LAB — LIPID PANEL
Cholesterol: 173 mg/dL (ref <200)
HDL: 48 mg/dL — ABNORMAL LOW (ref >=50)
LDL Cholesterol (Calc): 97 mg/dL (calc)
Non-HDL Cholesterol (Calc): 125 mg/dL (calc) (ref <130)
Total CHOL/HDL Ratio: 3.6 (calc) (ref <5.0)
Triglycerides: 182 mg/dL — ABNORMAL HIGH (ref <150)

## 2019-03-19 LAB — MAGNESIUM: Magnesium: 1.8 mg/dL (ref 1.5–2.5)

## 2019-03-19 LAB — HEMOGLOBIN A1C
Hgb A1c MFr Bld: 5.8 % of total Hgb — ABNORMAL HIGH (ref <5.7)
Mean Plasma Glucose: 120 (calc)
eAG (mmol/L): 6.6 (calc)

## 2019-03-19 LAB — TSH: TSH: 3.01 mIU/L (ref 0.40–4.50)

## 2019-04-07 ENCOUNTER — Other Ambulatory Visit: Payer: Self-pay

## 2019-04-07 ENCOUNTER — Ambulatory Visit (INDEPENDENT_AMBULATORY_CARE_PROVIDER_SITE_OTHER): Payer: PPO

## 2019-04-07 VITALS — BP 132/76 | HR 68 | Temp 97.6°F | Wt 147.0 lb

## 2019-04-07 DIAGNOSIS — Z23 Encounter for immunization: Secondary | ICD-10-CM | POA: Diagnosis not present

## 2019-04-07 NOTE — Progress Notes (Signed)
REPORTS for HD FLU VITALS ENTERED QUESTION ANSWERED

## 2019-04-30 ENCOUNTER — Other Ambulatory Visit: Payer: Self-pay | Admitting: Internal Medicine

## 2019-04-30 DIAGNOSIS — I10 Essential (primary) hypertension: Secondary | ICD-10-CM

## 2019-04-30 MED ORDER — LISINOPRIL 20 MG PO TABS: ORAL_TABLET | ORAL | 3 refills | Status: DC

## 2019-04-30 MED ORDER — VERAPAMIL HCL 120 MG PO TABS: ORAL_TABLET | ORAL | 3 refills | Status: DC

## 2019-05-06 ENCOUNTER — Other Ambulatory Visit: Payer: Self-pay

## 2019-05-06 MED ORDER — MELOXICAM 15 MG PO TABS: ORAL_TABLET | ORAL | 1 refills | Status: DC

## 2019-06-22 ENCOUNTER — Encounter: Payer: Self-pay | Admitting: Internal Medicine

## 2019-06-22 DIAGNOSIS — N182 Chronic kidney disease, stage 2 (mild): Secondary | ICD-10-CM | POA: Insufficient documentation

## 2019-06-22 DIAGNOSIS — E785 Hyperlipidemia, unspecified: Secondary | ICD-10-CM | POA: Insufficient documentation

## 2019-06-22 DIAGNOSIS — E1169 Type 2 diabetes mellitus with other specified complication: Secondary | ICD-10-CM | POA: Insufficient documentation

## 2019-06-22 NOTE — Progress Notes (Signed)
History of Present Illness:      This very nice 83 y.o. MWF  presents for 6 month follow up with HTN, HLD, T2_NIDDM and Vitamin D Deficiency. Patient's GERD is controlled with diet & her meds.      Patient is treated for HTN (1992)  & BP has been controlled at home. Today's BP is at goal - 140/84. Patient has had no complaints of any cardiac type chest pain, palpitations, dyspnea / orthopnea / PND, dizziness, claudication, or dependent edema.      Hyperlipidemia is controlled with diet & Atorvastatin. Patient denies myalgias or other med SE's. Last Lipids were at goal albeit sl elevated Trig's:  Lab Results  Component Value Date   CHOL 160 06/23/2019   HDL 40 (L) 06/23/2019   LDLCALC 92 06/23/2019   TRIG 184 (H) 06/23/2019   CHOLHDL 4.0 06/23/2019        Also, the patient has history of T2_NIDDM (2009) w/CKD 2  (A1c 6.5% / 2009 & 2012 and 6.4% / 2016).  After a 30 # weight loss in the 1st 6 months of this year, she was able to stop her Metformin. She has had no symptoms of reactive hypoglycemia, diabetic polys, paresthesias or visual blurring.  Last A1c was not at goal:  Lab Results  Component Value Date   HGBA1C 5.7 (H) 06/23/2019        Further, the patient also has history of Vitamin D Deficiency ("39"/ 2018) and supplements vitamin D without any suspected side-effects. Last vitamin D was at goal:  Lab Results  Component Value Date   VD25OH 91 06/23/2019    Current Outpatient Medications on File Prior to Visit  Medication Sig  . aspirin 81 MG tablet Take 81 mg by mouth daily.  Marland Kitchen atenolol (TENORMIN) 100 MG tablet Take 1 tablet Daily for BP  . atorvastatin (LIPITOR) 80 MG tablet Take 1/2  tablet daily as directed for cholesterol  . Bacillus Coagulans-Inulin (PROBIOTIC FORMULA PO) Take by mouth daily.  . Cholecalciferol (VITAMIN D PO) Take 5,000 Units by mouth 2 (two) times daily.   . famotidine (PEPCID) 20 MG tablet Take 1 tablet (20 mg total) by mouth 2 (two) times  daily as needed for heartburn or indigestion.  Marland Kitchen lisinopril (ZESTRIL) 20 MG tablet Take 1 tablet Daily for BP  . meloxicam (MOBIC) 15 MG tablet Take 1/2 to 1 tablet daily with food for Pain / Inflammation & Limit to 5 days/week to prevent Kidney Damage  . MILK THISTLE PO Take by mouth 2 (two) times daily.  . Multiple Vitamin (MULTIVITAMIN) tablet Take 1 tablet by mouth daily.  . Multiple Vitamins-Minerals (HAIR SKIN AND NAILS FORMULA PO) Take by mouth daily.  Marland Kitchen omeprazole (PRILOSEC) 40 MG capsule Take 1 capsule every morning for Acid Reflux  . verapamil (CALAN) 120 MG tablet Take 1 tablet 2 x /day for BP  . vitamin B-12 (CYANOCOBALAMIN) 1000 MCG tablet Take 1,000 mcg by mouth daily.   No current facility-administered medications on file prior to visit.    Allergies  Allergen Reactions  . Diltiazem    PMHx:   Past Medical History:  Diagnosis Date  . DDD (degenerative disc disease), lumbar   . Fatty liver disease, nonalcoholic    CT AB 0000000  . Hyperlipidemia   . Hypertension   . Type II or unspecified type diabetes mellitus without mention of complication, not stated as uncontrolled   . Vitamin D deficiency  Immunization History  Administered Date(s) Administered  . H1N1 06/28/2008  . Influenza Whole 05/04/2013  . Influenza, High Dose Seasonal PF 04/26/2014, 04/26/2015, 04/01/2016, 05/07/2017, 04/07/2019  . Influenza-Unspecified 04/14/2018  . Pneumococcal Conjugate-13 04/26/2014  . Pneumococcal Polysaccharide-23 07/10/2011  . Td 07/10/2011  . Zoster 06/28/2008   Past Surgical History:  Procedure Laterality Date  . APPENDECTOMY    . CATARACT EXTRACTION, BILATERAL Bilateral 09/2017   Dr. Gershon Crane  . KNEE ARTHROSCOPY Left   . MASTECTOMY Bilateral    30 years ago    FHx:    Reviewed / unchanged  SHx:    Reviewed / unchanged   Systems Review:  Constitutional: Denies fever, chills, wt changes, headaches, insomnia, fatigue, night sweats, change in appetite. Eyes:  Denies redness, blurred vision, diplopia, discharge, itchy, watery eyes.  ENT: Denies discharge, congestion, post nasal drip, epistaxis, sore throat, earache, hearing loss, dental pain, tinnitus, vertigo, sinus pain, snoring.  CV: Denies chest pain, palpitations, irregular heartbeat, syncope, dyspnea, diaphoresis, orthopnea, PND, claudication or edema. Respiratory: denies cough, dyspnea, DOE, pleurisy, hoarseness, laryngitis, wheezing.  Gastrointestinal: Denies dysphagia, odynophagia, heartburn, reflux, water brash, abdominal pain or cramps, nausea, vomiting, bloating, diarrhea, constipation, hematemesis, melena, hematochezia  or hemorrhoids. Genitourinary: Denies dysuria, frequency, urgency, nocturia, hesitancy, discharge, hematuria or flank pain. Musculoskeletal: Denies arthralgias, myalgias, stiffness, jt. swelling, pain, limping or strain/sprain.  Skin: Denies pruritus, rash, hives, warts, acne, eczema or change in skin lesion(s). Neuro: No weakness, tremor, incoordination, spasms, paresthesia or pain. Psychiatric: Denies confusion, memory loss or sensory loss. Endo: Denies change in weight, skin or hair change.  Heme/Lymph: No excessive bleeding, bruising or enlarged lymph nodes.  Physical Exam  BP 140/84   Pulse 64   Temp (!) 97.1 F (36.2 C)   Resp 16   Ht 5\' 3"  (1.6 m)   Wt 148 lb (67.1 kg)   BMI 26.22 kg/m   Appears  well nourished, well groomed  and in no distress.  Eyes: PERRLA, EOMs, conjunctiva no swelling or erythema. Sinuses: No frontal/maxillary tenderness ENT/Mouth: EAC's clear, TM's nl w/o erythema, bulging. Nares clear w/o erythema, swelling, exudates. Oropharynx clear without erythema or exudates. Oral hygiene is good. Tongue normal, non obstructing. Hearing intact.  Neck: Supple. Thyroid not palpable. Car 2+/2+ without bruits, nodes or JVD. Chest: Respirations nl with BS clear & equal w/o rales, rhonchi, wheezing or stridor.  Cor: Heart sounds normal w/ regular  rate and rhythm without sig. murmurs, gallops, clicks or rubs. Peripheral pulses normal and equal  without edema.  Abdomen: Soft & bowel sounds normal. Non-tender w/o guarding, rebound, hernias, masses or organomegaly.  Lymphatics: Unremarkable.  Musculoskeletal: Full ROM all peripheral extremities, joint stability, 5/5 strength and normal gait.  Skin: Warm, dry without exposed rashes, lesions or ecchymosis apparent.  Neuro: Cranial nerves intact, reflexes equal bilaterally. Sensory-motor testing grossly intact. Tendon reflexes grossly intact.  Pysch: Alert & oriented x 3.  Insight and judgement nl & appropriate. No ideations.  Assessment and Plan:  1. Essential hypertension  - Continue medication, monitor blood pressure at home.  - Continue DASH diet.  Reminder to go to the ER if any CP,  SOB, nausea, dizziness, severe HA, changes vision/speech.  - CBC with Diff - COMPLETE METABOLIC PANEL WITH GFR - Magnesium - TSH  2. Hyperlipidemia associated with type 2 diabetes mellitus ([place])  - Continue diet/meds, exercise,& lifestyle modifications.  - Continue monitor periodic cholesterol/liver & renal functions   - Lipid Profile - TSH  3. Type 2 diabetes mellitus  with stage 2 chronic kidney disease, without long-term current use of insulin ([facility])  - Continue diet, exercise  - Lifestyle modifications.  - Monitor appropriate labs.  - Hemoglobin A1c ([facility]) - Insulin, random  4. Vitamin D deficiency  - Continue supplementation.  - Vitamin D (25 hydroxy)  5. Gastroesophageal reflux disease  - CBC with Diff  6. Medication management  - CBC with Diff - COMPLETE METABOLIC PANEL WITH GFR - Magnesium - Lipid Profile - TSH - Hemoglobin A1c ([facility]) - Insulin, random - Vitamin D (25 hydroxy)        Discussed  regular exercise, BP monitoring, weight control to achieve/maintain BMI less than 25 and discussed med and SE's. Recommended labs to assess and monitor clinical status  with further disposition pending results of labs.  I discussed the assessment and treatment plan with the patient. The patient was provided an opportunity to ask questions and all were answered. The patient agreed with the plan and demonstrated an understanding of the instructions.  I provided over 30 minutes of exam, counseling, chart review and  complex critical decision making.  Kirtland Bouchard, MD

## 2019-06-22 NOTE — Patient Instructions (Signed)

## 2019-06-23 ENCOUNTER — Other Ambulatory Visit: Payer: Self-pay

## 2019-06-23 ENCOUNTER — Ambulatory Visit (INDEPENDENT_AMBULATORY_CARE_PROVIDER_SITE_OTHER): Payer: PPO | Admitting: Internal Medicine

## 2019-06-23 ENCOUNTER — Ambulatory Visit: Payer: PPO | Admitting: Internal Medicine

## 2019-06-23 VITALS — BP 140/84 | HR 64 | Temp 97.1°F | Resp 16 | Ht 63.0 in | Wt 148.0 lb

## 2019-06-23 DIAGNOSIS — E559 Vitamin D deficiency, unspecified: Secondary | ICD-10-CM | POA: Diagnosis not present

## 2019-06-23 DIAGNOSIS — E1122 Type 2 diabetes mellitus with diabetic chronic kidney disease: Secondary | ICD-10-CM

## 2019-06-23 DIAGNOSIS — N182 Chronic kidney disease, stage 2 (mild): Secondary | ICD-10-CM | POA: Diagnosis not present

## 2019-06-23 DIAGNOSIS — E785 Hyperlipidemia, unspecified: Secondary | ICD-10-CM | POA: Diagnosis not present

## 2019-06-23 DIAGNOSIS — Z79899 Other long term (current) drug therapy: Secondary | ICD-10-CM | POA: Diagnosis not present

## 2019-06-23 DIAGNOSIS — I10 Essential (primary) hypertension: Secondary | ICD-10-CM

## 2019-06-23 DIAGNOSIS — E1169 Type 2 diabetes mellitus with other specified complication: Secondary | ICD-10-CM

## 2019-06-23 DIAGNOSIS — K219 Gastro-esophageal reflux disease without esophagitis: Secondary | ICD-10-CM | POA: Diagnosis not present

## 2019-06-24 LAB — COMPLETE METABOLIC PANEL WITH GFR
AG Ratio: 2.2 (calc) (ref 1.0–2.5)
ALT: 24 U/L (ref 6–29)
AST: 26 U/L (ref 10–35)
Albumin: 4.6 g/dL (ref 3.6–5.1)
Alkaline phosphatase (APISO): 50 U/L (ref 37–153)
BUN: 20 mg/dL (ref 7–25)
CO2: 28 mmol/L (ref 20–32)
Calcium: 9.3 mg/dL (ref 8.6–10.4)
Chloride: 104 mmol/L (ref 98–110)
Creat: 0.73 mg/dL (ref 0.60–0.88)
GFR, Est African American: 90 mL/min/{1.73_m2} (ref >=60)
GFR, Est Non African American: 77 mL/min/{1.73_m2} (ref >=60)
Globulin: 2.1 g/dL (calc) (ref 1.9–3.7)
Glucose, Bld: 98 mg/dL (ref 65–99)
Potassium: 4 mmol/L (ref 3.5–5.3)
Sodium: 142 mmol/L (ref 135–146)
Total Bilirubin: 1.2 mg/dL (ref 0.2–1.2)
Total Protein: 6.7 g/dL (ref 6.1–8.1)

## 2019-06-24 LAB — CBC WITH DIFFERENTIAL/PLATELET
Absolute Monocytes: 605 cells/uL (ref 200–950)
Basophils Absolute: 28 cells/uL (ref 0–200)
Basophils Relative: 0.5 %
Eosinophils Absolute: 193 cells/uL (ref 15–500)
Eosinophils Relative: 3.5 %
HCT: 43.5 % (ref 35.0–45.0)
Hemoglobin: 14.7 g/dL (ref 11.7–15.5)
Lymphs Abs: 1683 cells/uL (ref 850–3900)
MCH: 33.1 pg — ABNORMAL HIGH (ref 27.0–33.0)
MCHC: 33.8 g/dL (ref 32.0–36.0)
MCV: 98 fL (ref 80.0–100.0)
MPV: 11.1 fL (ref 7.5–12.5)
Monocytes Relative: 11 %
Neutro Abs: 2992 cells/uL (ref 1500–7800)
Neutrophils Relative %: 54.4 %
Platelets: 178 10*3/uL (ref 140–400)
RBC: 4.44 10*6/uL (ref 3.80–5.10)
RDW: 12.5 % (ref 11.0–15.0)
Total Lymphocyte: 30.6 %
WBC: 5.5 10*3/uL (ref 3.8–10.8)

## 2019-06-24 LAB — INSULIN, RANDOM: Insulin: 9.4 u[IU]/mL

## 2019-06-24 LAB — LIPID PANEL
Cholesterol: 160 mg/dL (ref <200)
HDL: 40 mg/dL — ABNORMAL LOW (ref >=50)
LDL Cholesterol (Calc): 92 mg/dL (calc)
Non-HDL Cholesterol (Calc): 120 mg/dL (calc) (ref <130)
Total CHOL/HDL Ratio: 4 (calc) (ref <5.0)
Triglycerides: 184 mg/dL — ABNORMAL HIGH (ref <150)

## 2019-06-24 LAB — VITAMIN D 25 HYDROXY (VIT D DEFICIENCY, FRACTURES): Vit D, 25-Hydroxy: 91 ng/mL (ref 30–100)

## 2019-06-24 LAB — TSH: TSH: 3.39 mIU/L (ref 0.40–4.50)

## 2019-06-24 LAB — MAGNESIUM: Magnesium: 2.1 mg/dL (ref 1.5–2.5)

## 2019-06-24 LAB — HEMOGLOBIN A1C
Hgb A1c MFr Bld: 5.7 % of total Hgb — ABNORMAL HIGH (ref <5.7)
Mean Plasma Glucose: 117 (calc)
eAG (mmol/L): 6.5 (calc)

## 2019-06-26 ENCOUNTER — Encounter: Payer: Self-pay | Admitting: Internal Medicine

## 2019-07-06 ENCOUNTER — Other Ambulatory Visit: Payer: Self-pay | Admitting: Internal Medicine

## 2019-07-06 MED ORDER — PHENTERMINE HCL 37.5 MG PO TABS: ORAL_TABLET | ORAL | 1 refills | Status: DC

## 2019-08-26 ENCOUNTER — Other Ambulatory Visit: Payer: Self-pay | Admitting: Internal Medicine

## 2019-08-26 ENCOUNTER — Telehealth: Payer: Self-pay | Admitting: *Deleted

## 2019-08-26 MED ORDER — TOPIRAMATE 50 MG PO TABS: ORAL_TABLET | ORAL | 1 refills | Status: DC

## 2019-08-26 NOTE — Telephone Encounter (Signed)
Patient called and states she is having trouble with losing weight. She is taking Phentermine in the morning.  Dr Melford Aase sent in an RX for Topiramate to take 1 tablet at dinner and 1 tablet in the evening . Patient is aware.

## 2019-08-30 ENCOUNTER — Other Ambulatory Visit: Payer: Self-pay

## 2019-08-30 DIAGNOSIS — E782 Mixed hyperlipidemia: Secondary | ICD-10-CM

## 2019-08-30 MED ORDER — ATORVASTATIN CALCIUM 80 MG PO TABS: ORAL_TABLET | ORAL | 1 refills | Status: DC

## 2019-09-15 ENCOUNTER — Other Ambulatory Visit: Payer: Self-pay

## 2019-09-15 MED ORDER — FAMOTIDINE 20 MG PO TABS: 20.0000 mg | ORAL_TABLET | Freq: Two times a day (BID) | ORAL | 1 refills | Status: DC | PRN

## 2019-09-22 NOTE — Patient Instructions (Addendum)
Debra Collins , Thank you for taking time to come for your Medicare Wellness Visit. I appreciate your ongoing commitment to your health goals. Please review the following plan we discussed and let me know if I can assist you in the future.   These are the goals we discussed: Goals    . Blood Pressure < 140/90    . LDL CALC < 70    . Weight (lb) < 160 lb (72.6 kg)       This is a list of the screening recommended for you and due dates:  Health Maintenance  Topic Date Due  . Eye exam for diabetics  02/18/2019  . Complete foot exam   12/14/2019  . Hemoglobin A1C  12/22/2019  . Tetanus Vaccine  07/09/2021  . Flu Shot  Completed  . DEXA scan (bone density measurement)  Completed  . Pneumonia vaccines  Completed    Allergy Symptoms / Runny Nose: Chose one  Claritin -when sinuses are runny and Caritin-D when you have nasal congestion Take one tablet daily This is non-drowsy and can be taken during the day.  OR  Allegra / fexofenadine Take 164m by mouth daily If this is not effective try Zyrtec or Xyzal   OR  Zyrtec / Cetirizine -  Take 172mby mouth May cause drowsiness, take nightly Be sure to drink plenty of water If this is not effective, try Xyzal or Allegra   OR  Xyzal / Levocetirazine  Take 27m51my mouth May cause drowsiness, take nightly Be sure to drink plenty of water If this is not effective try Allegra or Zyrtec  *If you battle with chronic allergies you may need to change the antihistamine you currently use to find most effective.    [facility] Sinus Rinse / Neti Pot Use warm bottled or distilled water DO NOT use tap water! Use twice a day as needed This will help to sooth irritated sinuses and clear nasal congestion If using nasal sprays, do so after completing this.  Flonase: One spray in each nostril daily  This will help to open your nasal passages Use this AFTER you do any type of nasal rinse    Vit D  & Vit C 1,000 mg   are  recommended to help protect  against the Covid-19 and other Corona viruses.    Also it's recommended  to take  Zinc 50 mg  to help  protect against the Covid-19   and best place to get  is also on [employer]  and don't pay more than 6-8 cents /pill !  ================================ Coronavirus (COVID-19) Are you at risk?  Are you at risk for the Coronavirus (COVID-19)?  To be considered HIGH RISK for Coronavirus (COVID-19), you have to meet the following criteria:  . Traveled to [place] the [place] have fever, cough, and shortness of breath within the last 2 weeks of travel OR . Been in close contact with a person diagnosed with COVID-19 within the last 2 weeks and have  . fever, cough,and shortness of breath .  . IF YOU DO NOT MEET THESE CRITERIA, YOU ARE CONSIDERED LOW RISK FOR COVID-19.  What to do if you are HIGH RISK for COVID-19?  . IMarland Kitchen you are having a medical emergency, call 911. . Seek medical care right away. Before you go to a doctor's office, urgent care or  emergency department, .  call ahead and tell them about your recent travel, contact with someone diagnosed with COVID-19  .  and your symptoms.  . You should receive instructions from your physician's office regarding next steps of care.  . When you arrive at healthcare provider, tell the healthcare staff immediately you have returned from  . visiting [place], [place], [place], [place] or [place]; or traveled in the [place] to [place], [place],  . [place] or [place] in the last two weeks or you have been in close contact with a person diagnosed with  . COVID-19 in the last 2 weeks.   . Tell the health care staff about your symptoms: fever, cough and shortness of breath. . After you have been seen by a medical provider, you will be either: o Tested for (COVID-19) and discharged home on quarantine  except to seek medical care if  o symptoms worsen, and asked to  - Stay home and avoid contact with others until you get your results (4-5 days)  - Avoid travel on public transportation if possible (such as bus, train, or airplane) or o Sent to the Emergency Department by EMS for evaluation, COVID-19 testing  and  o possible admission depending on your condition and test results.  What to do if you are LOW RISK for COVID-19?  Reduce your risk of any infection by using the same precautions used for avoiding the common cold or flu:  Marland Kitchen Wash your hands often with soap and warm water for at least 20 seconds.  If soap and water are not readily available,  . use an alcohol-based hand sanitizer with at least 60% alcohol.  . If coughing or sneezing, cover your mouth and nose by coughing or sneezing into the elbow areas of your shirt or coat, .  into a tissue or into your sleeve (not your hands). . Avoid shaking hands with others and consider head nods or verbal greetings only. . Avoid touching your eyes, nose, or mouth with unwashed hands.  . Avoid close contact with people who are sick. . Avoid places or events with large numbers of people in one location, like concerts or sporting events. . Carefully consider travel plans you have or are making. . If you are planning any travel outside or inside the [place], visit the CDC's Travelers' Health webpage for the latest health notices. . If you have some symptoms but not all symptoms, continue to monitor at home and seek medical attention  . if your symptoms worsen. . If you are having a medical emergency, call 911.   . >>>>>>>>>>>>>>>>>>>>>>>>>>>>>>>>> . We Do NOT Approve of  [facility], [employer]  Our Patients  To Do Home Visits & We Do NOT Approve of [employer] > > > > > > > > > > > > > > > > > > > > > > > > > > > > > > > > > > > > > > >  Preventive Care for Adults  A healthy lifestyle and preventive care can promote  health and wellness. Preventive health guidelines for women include the following key practices.  A routine yearly physical is a good way to check with your health care provider about your health and preventive screening. It is a chance to share any concerns and updates on your health and to receive a thorough exam.  Visit your dentist for a routine exam and preventive care every 6 months. Brush your teeth  twice a day and floss once a day. Good oral hygiene prevents tooth decay and gum disease.  The frequency of eye exams is based on your age, health, family medical history, use of contact lenses, and other factors. Follow your health care provider's recommendations for frequency of eye exams.  Eat a healthy diet. Foods like vegetables, fruits, whole grains, low-fat dairy products, and lean protein foods contain the nutrients you need without too many calories. Decrease your intake of foods high in solid fats, added sugars, and salt. Eat the right amount of calories for you. Get information about a proper diet from your health care provider, if necessary.  Regular physical exercise is one of the most important things you can do for your health. Most adults should get at least 150 minutes of moderate-intensity exercise (any activity that increases your heart rate and causes you to sweat) each week. In addition, most adults need muscle-strengthening exercises on 2 or more days a week.  Maintain a healthy weight. The body mass index (BMI) is a screening tool to identify possible weight problems. It provides an estimate of body fat based on height and weight. Your health care provider can find your BMI and can help you achieve or maintain a healthy weight. For adults 20 years and older:  A BMI below 18.5 is considered underweight.  A BMI of 18.5 to 24.9 is normal.  A BMI of 25 to 29.9 is considered overweight.  A BMI of 30 and above is considered obese.  Maintain normal blood lipids and cholesterol  levels by exercising and minimizing your intake of saturated fat. Eat a balanced diet with plenty of fruit and vegetables. If your lipid or cholesterol levels are high, you are over 50, or you are at high risk for heart disease, you may need your cholesterol levels checked more frequently. Ongoing high lipid and cholesterol levels should be treated with medicines if diet and exercise are not working.  If you smoke, find out from your health care provider how to quit. If you do not use tobacco, do not start.  Lung cancer screening is recommended for adults aged 13-80 years who are at high risk for developing lung cancer because of a history of smoking. A yearly low-dose CT scan of the lungs is recommended for people who have at least a 30-pack-year history of smoking and are a current smoker or have quit within the past 15 years. A pack year of smoking is smoking an average of 1 pack of cigarettes a day for 1 year (for example: 1 pack a day for 30 years or 2 packs a day for 15 years). Yearly screening should continue until the smoker has stopped smoking for at least 15 years. Yearly screening should be stopped for people who develop a health problem that would prevent them from having lung cancer treatment.  Avoid use of street drugs. Do not share needles with anyone. Ask for help if you need support or instructions about stopping the use of drugs.  High blood pressure causes heart disease and increases the risk of stroke.  Ongoing high blood pressure should be treated with medicines if weight loss and exercise do not work.  If you are 77-87 years old, ask your health care provider if you should take aspirin to prevent strokes.  Diabetes screening involves taking a blood sample to check your fasting blood sugar level. This should be done once every 3 years, after age 63, if you are within normal weight  and without risk factors for diabetes. Testing should be considered at a younger age or be carried out  more frequently if you are overweight and have at least 1 risk factor for diabetes.  Breast cancer screening is essential preventive care for women. You should practice "breast self-awareness." This means understanding the normal appearance and feel of your breasts and may include breast self-examination. Any changes detected, no matter how small, should be reported to a health care provider. Women in their 26s and 30s should have a clinical breast exam (CBE) by a health care provider as part of a regular health exam every 1 to 3 years. After age 20, women should have a CBE every year. Starting at age 56, women should consider having a mammogram (breast X-ray test) every year. Women who have a family history of breast cancer should talk to their health care provider about genetic screening. Women at a high risk of breast cancer should talk to their health care providers about having an MRI and a mammogram every year.  Breast cancer gene (BRCA)-related cancer risk assessment is recommended for women who have family members with BRCA-related cancers. BRCA-related cancers include breast, ovarian, tubal, and peritoneal cancers. Having family members with these cancers may be associated with an increased risk for harmful changes (mutations) in the breast cancer genes BRCA1 and BRCA2. Results of the assessment will determine the need for genetic counseling and BRCA1 and BRCA2 testing.  Routine pelvic exams to screen for cancer are no longer recommended for nonpregnant women who are considered low risk for cancer of the pelvic organs (ovaries, uterus, and vagina) and who do not have symptoms. Ask your health care provider if a screening pelvic exam is right for you.  If you have had past treatment for cervical cancer or a condition that could lead to cancer, you need Pap tests and screening for cancer for at least 20 years after your treatment. If Pap tests have been discontinued, your risk factors (such as having  a new sexual partner) need to be reassessed to determine if screening should be resumed. Some women have medical problems that increase the chance of getting cervical cancer. In these cases, your health care provider may recommend more frequent screening and Pap tests.    Colorectal cancer can be detected and often prevented. Most routine colorectal cancer screening begins at the age of 37 years and continues through age 48 years. However, your health care provider may recommend screening at an earlier age if you have risk factors for colon cancer. On a yearly basis, your health care provider may provide home test kits to check for hidden blood in the stool. Use of a small camera at the end of a tube, to directly examine the colon (sigmoidoscopy or colonoscopy), can detect the earliest forms of colorectal cancer. Talk to your health care provider about this at age 58, when routine screening begins.  Direct exam of the colon should be repeated every 5-10 years through age 69 years, unless early forms of pre-cancerous polyps or small growths are found.  Osteoporosis is a disease in which the bones lose minerals and strength with aging. This can result in serious bone fractures or breaks. The risk of osteoporosis can be identified using a bone density scan. Women ages 47 years and over and women at risk for fractures or osteoporosis should discuss screening with their health care providers. Ask your health care provider whether you should take a calcium supplement or vitamin D to  reduce the rate of osteoporosis.  Menopause can be associated with physical symptoms and risks. Hormone replacement therapy is available to decrease symptoms and risks. You should talk to your health care provider about whether hormone replacement therapy is right for you.  Use sunscreen. Apply sunscreen liberally and repeatedly throughout the day. You should seek shade when your shadow is shorter than you. Protect yourself by  wearing long sleeves, pants, a wide-brimmed hat, and sunglasses year round, whenever you are outdoors.  Once a month, do a whole body skin exam, using a mirror to look at the skin on your back. Tell your health care provider of new moles, moles that have irregular borders, moles that are larger than a pencil eraser, or moles that have changed in shape or color.  Stay current with required vaccines (immunizations).  Influenza vaccine. All adults should be immunized every year.  Tetanus, diphtheria, and acellular pertussis (Td, Tdap) vaccine. Pregnant women should receive 1 dose of Tdap vaccine during each pregnancy. The dose should be obtained regardless of the length of time since the last dose. Immunization is preferred during the 27th-36th week of gestation. An adult who has not previously received Tdap or who does not know her vaccine status should receive 1 dose of Tdap. This initial dose should be followed by tetanus and diphtheria toxoids (Td) booster doses every 10 years. Adults with an unknown or incomplete history of completing a 3-dose immunization series with Td-containing vaccines should begin or complete a primary immunization series including a Tdap dose. Adults should receive a Td booster every 10 years.    Zoster vaccine. One dose is recommended for adults aged 4 years or older unless certain conditions are present.    Pneumococcal 13-valent conjugate (PCV13) vaccine. When indicated, a person who is uncertain of her immunization history and has no record of immunization should receive the PCV13 vaccine. An adult aged 41 years or older who has certain medical conditions and has not been previously immunized should receive 1 dose of PCV13 vaccine. This PCV13 should be followed with a dose of pneumococcal polysaccharide (PPSV23) vaccine. The PPSV23 vaccine dose should be obtained at least 1 or more year(s) after the dose of PCV13 vaccine. An adult aged 22 years or older who has certain  medical conditions and previously received 1 or more doses of PPSV23 vaccine should receive 1 dose of PCV13. The PCV13 vaccine dose should be obtained 1 or more years after the last PPSV23 vaccine dose.    Pneumococcal polysaccharide (PPSV23) vaccine. When PCV13 is also indicated, PCV13 should be obtained first. All adults aged 25 years and older should be immunized. An adult younger than age 38 years who has certain medical conditions should be immunized. Any person who resides in a nursing home or long-term care facility should be immunized. An adult smoker should be immunized. People with an immunocompromised condition and certain other conditions should receive both PCV13 and PPSV23 vaccines. People with human immunodeficiency virus (HIV) infection should be immunized as soon as possible after diagnosis. Immunization during chemotherapy or radiation therapy should be avoided. Routine use of PPSV23 vaccine is not recommended for American Indians, [place]atives, or people younger than 65 years unless there are medical conditions that require PPSV23 vaccine. When indicated, people who have unknown immunization and have no record of immunization should receive PPSV23 vaccine. One-time revaccination 5 years after the first dose of PPSV23 is recommended for people aged 19-64 years who have chronic kidney failure, nephrotic syndrome, asplenia,  or immunocompromised conditions. People who received 1-2 doses of PPSV23 before age 10 years should receive another dose of PPSV23 vaccine at age 58 years or later if at least 5 years have passed since the previous dose. Doses of PPSV23 are not needed for people immunized with PPSV23 at or after age 33 years.   Preventive Services / Frequency  Ages 72 years and over  Blood pressure check.  Lipid and cholesterol check.  Lung cancer screening. / Every year if you are aged 67-80 years and have a 30-pack-year history of smoking and currently smoke or have quit within  the past 15 years. Yearly screening is stopped once you have quit smoking for at least 15 years or develop a health problem that would prevent you from having lung cancer treatment.  Clinical breast exam.** / Every year after age 37 years.   BRCA-related cancer risk assessment.** / For women who have family members with a BRCA-related cancer (breast, ovarian, tubal, or peritoneal cancers).  Mammogram.** / Every year beginning at age 55 years and continuing for as long as you are in good health. Consult with your health care provider.  Pap test.** / Every 3 years starting at age 59 years through age 27 or 19 years with 3 consecutive normal Pap tests. Testing can be stopped between 65 and 70 years with 3 consecutive normal Pap tests and no abnormal Pap or HPV tests in the past 10 years.  Fecal occult blood test (FOBT) of stool. / Every year beginning at age 51 years and continuing until age 48 years. You may not need to do this test if you get a colonoscopy every 10 years.  Flexible sigmoidoscopy or colonoscopy.** / Every 5 years for a flexible sigmoidoscopy or every 10 years for a colonoscopy beginning at age 53 years and continuing until age 36 years.  Hepatitis C blood test.** / For all people born from [birth year] through 1963 and any individual with known risks for hepatitis C.  Osteoporosis screening.** / A one-time screening for women ages 63 years and over and women at risk for fractures or osteoporosis.  Skin self-exam. / Monthly.  Influenza vaccine. / Every year.  Tetanus, diphtheria, and acellular pertussis (Tdap/Td) vaccine.** / 1 dose of Td every 10 years.  Zoster vaccine.** / 1 dose for adults aged 44 years or older.  Pneumococcal 13-valent conjugate (PCV13) vaccine.** / Consult your health care provider.  Pneumococcal polysaccharide (PPSV23) vaccine.** / 1 dose for all adults aged 32 years and older. Screening for abdominal aortic aneurysm (AAA)  by ultrasound is recommended for  people who have history of high blood pressure or who are current or former smokers. ++++++++++++++++++++ Recommend Adult Low Dose Aspirin or  coated  Aspirin 81 mg daily  To reduce risk of Colon Cancer 40 %,  Skin Cancer 26 % ,  Melanoma 46%  and  Pancreatic cancer 60% ++++++++++++++++++++ Vitamin D goal  is between 70-100.  Please make sure that you are taking your Vitamin D as directed.  It is very important as a natural anti-inflammatory  helping hair, skin, and nails, as well as reducing stroke and heart attack risk.  It helps your bones and helps with mood. It also decreases numerous cancer risks so please take it as directed.  Low Vit D is associated with a 200-300% higher risk for CANCER  and 200-300% higher risk for HEART   ATTACK  &  STROKE.   .....................................Marland Kitchen It is also associated with higher death rate  at younger ages,  autoimmune diseases like Rheumatoid arthritis, Lupus, Multiple Sclerosis.    Also many other serious conditions, like depression, Alzheimer's Dementia, infertility, muscle aches, fatigue, fibromyalgia - just to name a few. ++++++++++++++++++ Recommend the book "The END of DIETING" by Dr Excell Seltzer  & the book "The END of DIABETES " by Dr Excell Seltzer At [email or web address] - get book & Audio CD's    Being diabetic has a  300% increased risk for heart attack, stroke, cancer, and alzheimer- type vascular dementia. It is very important that you work harder with diet by avoiding all foods that are white. Avoid white rice (brown & wild rice is OK), white potatoes (sweetpotatoes in moderation is OK), White bread or wheat bread or anything made out of white flour like bagels, donuts, rolls, buns, biscuits, cakes, pastries, cookies, pizza crust, and pasta (made from white flour & egg whites) - vegetarian pasta or spinach or wheat pasta is OK. Multigrain breads like Arnold's or [employer], or multigrain sandwich thins or flatbreads.  Diet, exercise  and weight loss can reverse and cure diabetes in the early stages.  Diet, exercise and weight loss is very important in the control and prevention of complications of diabetes which affects every system in your body, ie. Brain - dementia/stroke, eyes - glaucoma/blindness, heart - heart attack/heart failure, kidneys - dialysis, stomach - gastric paralysis, intestines - malabsorption, nerves - severe painful neuritis, circulation - gangrene & loss of a leg(s), and finally cancer and Alzheimers.    I recommend avoid fried & greasy foods,  sweets/candy, white rice (brown or wild rice or Quinoa is OK), white potatoes (sweet potatoes are OK) - anything made from white flour - bagels, doughnuts, rolls, buns, biscuits,white and wheat breads, pizza crust and traditional pasta made of white flour & egg white(vegetarian pasta or spinach or wheat pasta is OK).  Multi-grain bread is OK - like multi-grain flat bread or sandwich thins. Avoid alcohol in excess. Exercise is also important.    Eat all the vegetables you want - avoid meat, especially red meat and dairy - especially cheese.  Cheese is the most concentrated form of trans-fats which is the worst thing to clog up our arteries. Veggie cheese is OK which can be found in the fresh produce section at [employer] or [employer] or [employer]  +++++++++++++++++++ DASH Eating Plan  DASH stands for "Dietary Approaches to Stop Hypertension."   The DASH eating plan is a healthy eating plan that has been shown to reduce high blood pressure (hypertension). Additional health benefits may include reducing the risk of type 2 diabetes mellitus, heart disease, and stroke. The DASH eating plan may also help with weight loss. WHAT DO I NEED TO KNOW ABOUT THE DASH EATING PLAN? For the DASH eating plan, you will follow these general guidelines:  Choose foods with a percent daily value for sodium of less than 5% (as listed on the food label).  Use salt-free seasonings or  herbs instead of table salt or sea salt.  Check with your health care provider or pharmacist before using salt substitutes.  Eat lower-sodium products, often labeled as "lower sodium" or "no salt added."  Eat fresh foods.  Eat more vegetables, fruits, and low-fat dairy products.  Choose whole grains. Look for the word "whole" as the first word in the ingredient list.  Choose fish   Limit sweets, desserts, sugars, and sugary drinks.  Choose heart-healthy fats.  Eat veggie cheese   Eat more home-cooked  food and less restaurant, buffet, and fast food.  Limit fried foods.  Cook foods using methods other than frying.  Limit canned vegetables. If you do use them, rinse them well to decrease the sodium.  When eating at a restaurant, ask that your food be prepared with less salt, or no salt if possible.                      WHAT FOODS CAN I EAT? Read Dr Fara Olden Fuhrman's books on The End of Dieting & The End of Diabetes  Grains Whole grain or whole wheat bread. Brown rice. Whole grain or whole wheat pasta. Quinoa, bulgur, and whole grain cereals. Low-sodium cereals. Corn or whole wheat flour tortillas. Whole grain cornbread. Whole grain crackers. Low-sodium crackers.  Vegetables Fresh or frozen vegetables (raw, steamed, roasted, or grilled). Low-sodium or reduced-sodium tomato and vegetable juices. Low-sodium or reduced-sodium tomato sauce and paste. Low-sodium or reduced-sodium canned vegetables.   Fruits All fresh, canned (in natural juice), or frozen fruits.  Protein Products  All fish and seafood.  Dried beans, peas, or lentils. Unsalted nuts and seeds. Unsalted canned beans.  Dairy Low-fat dairy products, such as skim or 1% milk, 2% or reduced-fat cheeses, low-fat ricotta or cottage cheese, or plain low-fat yogurt. Low-sodium or reduced-sodium cheeses.  Fats and Oils Tub margarines without trans fats. Light or reduced-fat mayonnaise and salad dressings (reduced sodium).  Avocado. Safflower, olive, or canola oils. Natural peanut or almond butter.  Other Unsalted popcorn and pretzels. The items listed above may not be a complete list of recommended foods or beverages. Contact your dietitian for more options.  +++++++++++++++  WHAT FOODS ARE NOT RECOMMENDED? Grains/ White flour or wheat flour White bread. White pasta. White rice. Refined cornbread. Bagels and croissants. Crackers that contain trans fat.  Vegetables  Creamed or fried vegetables. Vegetables in a . Regular canned vegetables. Regular canned tomato sauce and paste. Regular tomato and vegetable juices.  Fruits Dried fruits. Canned fruit in light or heavy syrup. Fruit juice.  Meat and Other Protein Products Meat in general - RED meat & White meat.  Fatty cuts of meat. Ribs, chicken wings, all processed meats as bacon, sausage, bologna, salami, fatback, hot dogs, bratwurst and packaged luncheon meats.  Dairy Whole or 2% milk, cream, half-and-half, and cream cheese. Whole-fat or sweetened yogurt. Full-fat cheeses or blue cheese. Non-dairy creamers and whipped toppings. Processed cheese, cheese spreads, or cheese curds.  Condiments Onion and garlic salt, seasoned salt, table salt, and sea salt. Canned and packaged gravies. Worcestershire sauce. Tartar sauce. Barbecue sauce. Teriyaki sauce. Soy sauce, including reduced sodium. Steak sauce. Fish sauce. Oyster sauce. Cocktail sauce. Horseradish. Ketchup and mustard. Meat flavorings and tenderizers. Bouillon cubes. Hot sauce. Tabasco sauce. Marinades. Taco seasonings. Relishes.  Fats and Oils Butter, stick margarine, lard, shortening and bacon fat. Coconut, palm kernel, or palm oils. Regular salad dressings.  Pickles and olives. Salted popcorn and pretzels.  The items listed above may not be a complete list of foods and beverages to avoid.

## 2019-09-22 NOTE — Progress Notes (Signed)
MEDICARE ANNUAL WELLNESS VISIT AND 3  MONTH FOLLOW UP  Assessment:   Diagnoses and all orders for this visit:  Encounter for Medicare annual wellness exam Yearly  Essential hypertension Continue current medications: Atenolol 100mg  daily Monitor blood pressure at home; call if consistently over 130/80 Continue DASH diet.   Reminder to go to the ER if any CP, SOB, nausea, dizziness, severe HA, changes vision/speech, left arm numbness and tingling and jaw pain.  Hyperlipidemia associated with type 2 diabetes mellitus (HCC) Continue medications: Atorvastatin 80mg , half tab daily Discussed dietary and exercise modifications Low fat diet   Type 2 diabetes mellitus with stage 2 chronic kidney disease, without long-term current use of insulin (HCC) Controlled with diet and exercise Discussed general issues about diabetes pathophysiology and management. Education: Reviewed 'ABCs' of diabetes management (respective goals in parentheses):  A1C (<7), blood pressure (<130/80), and cholesterol (LDL <70) Dietary recommendations Encouraged aerobic exercise.  Discussed foot care, check daily Yearly retinal exam Dental exam every 6 months Monitor blood glucose, discussed goal for patient   Vitamin D deficiency Continue supplementation Taking Vitamin D 5,000 IU, two tablets daily  Gastroesophageal reflux disease without esophagitis Doing well at this time Continue: famotidine 20mg  Diet discussed Monitor for triggers Avoid food with high acid content Avoid excessive cafeine Increase water intake  CKD stage 2 due to type 2 diabetes mellitus (HCC) Increase fluids  Avoid NSAIDS Blood pressure control Monitor sugars  Will continue to monitor   Chronic obstructive pulmonary disease, unspecified COPD type ([place]) Doing well at this time, no medicaitons No triggers, well controlled symptoms, cont to monitor  Overweight (BMI 25.0-29.9) Discussed dietary and exercise  modifications  Medication management Continued  Over 40 minutes of interview, exam, counseling, chart review and critical decision making was performed  Future Appointments  Date Time Provider [place]  12/31/2019 10:00 AM Unk Pinto, MD [facility] None     Plan:   During the course of the visit the patient was educated and counseled about appropriate screening and preventive services including:    Pneumococcal vaccine   Prevnar 13  Influenza vaccine  Td vaccine  Screening electrocardiogram  Bone densitometry screening  Colorectal cancer screening  Diabetes screening  Glaucoma screening  Nutrition counseling   Advanced directives: requested   Subjective:  Debra Collins is a 84 y.o. female who presents for Medicare Annual Wellness Visit and 3 month follow up for HTN, HLD, DMII diet controlled with CKD, GERD, Vitamin D Deficiency and weight.    She reports overall she is doing well, no health or medicaton concerns today.  She reports some increase in frustration with her husband.  She reports he is having increased short term memory loss.  She also reports he has hearing aids but he does not wear them and she is constantly repeating herself. She reports she gets outside, weather permitting to walk and she works in her flower beds to help manage her stress.  She reports her two sons live in town as well.  Patient's GERD is controlled with her meds.  BMI is Body mass index is 26.39 kg/m., she has not been working on diet and exercise, she has cut out wine other than Friday nights, she has cut out cakes/cookies/pies and has started phentermine and topmax from last visit.   Wt Readings from Last 3 Encounters:  09/23/19 149 lb (67.6 kg)  06/23/19 148 lb (67.1 kg)  04/07/19 147 lb (66.7 kg)   Her blood pressure has been controlled  at home, today their BP is BP: 124/80 Patient admits to not taking BP medication this AM.   She does not workout. She denies  chest pain, shortness of breath, dizziness.   She is on cholesterol medication (atorvastatin 80 mg taking half pill and denies myalgias. Her cholesterol is not at goal. The cholesterol last visit was:    Lab Results  Component Value Date   CHOL 160 06/23/2019   HDL 40 (L) 06/23/2019   LDLCALC 92 06/23/2019   TRIG 184 (H) 06/23/2019   CHOLHDL 4.0 06/23/2019    She has been working on diet and exercise for diabetes with CKD (on metformin 2000 mg daily), she is on bASA, she is on ACE, and denies paresthesia of the feet, polydipsia and polyuria. Last A1C in the office was:  Lab Results  Component Value Date   HGBA1C 5.7 (H) 06/23/2019   Lab Results  Component Value Date   GFRNONAA 77 06/23/2019   Patient is on Vitamin D supplement - 5000 units daily, but forgets to take   Lab Results  Component Value Date   VD25OH 91 06/23/2019        Medication Review: Current Outpatient Medications on File Prior to Visit  Medication Sig Dispense Refill  . aspirin 81 MG tablet Take 81 mg by mouth daily.    Marland Kitchen atenolol (TENORMIN) 100 MG tablet Take 1 tablet Daily for BP 90 tablet 3  . atorvastatin (LIPITOR) 80 MG tablet Take 1/2  tablet daily as directed for cholesterol 90 tablet 1  . Bacillus Coagulans-Inulin (PROBIOTIC FORMULA PO) Take by mouth daily.    . Cholecalciferol (VITAMIN D PO) Take 5,000 Units by mouth 2 (two) times daily.     . famotidine (PEPCID) 20 MG tablet Take 1 tablet (20 mg total) by mouth 2 (two) times daily as needed for heartburn or indigestion. 180 tablet 1  . lisinopril (ZESTRIL) 20 MG tablet Take 1 tablet Daily for BP 90 tablet 3  . meloxicam (MOBIC) 15 MG tablet Take 1/2 to 1 tablet daily with food for Pain / Inflammation & Limit to 5 days/week to prevent Kidney Damage 90 tablet 1  . Multiple Vitamin (MULTIVITAMIN) tablet Take 1 tablet by mouth daily.    . Multiple Vitamins-Minerals (HAIR SKIN AND NAILS FORMULA PO) Take by mouth daily.    . verapamil (CALAN) 120 MG  tablet Take 1 tablet 2 x /day for BP 180 tablet 3  . vitamin B-12 (CYANOCOBALAMIN) 1000 MCG tablet Take 1,000 mcg by mouth daily.    . phentermine (ADIPEX-P) 37.5 MG tablet Take 1 tablet every Morning for Dieting & Weight Loss (Patient not taking: Reported on 09/23/2019) 90 tablet 1  . topiramate (TOPAMAX) 50 MG tablet Take 1/2 to 1 tablet 2 x /day at Suppertime & Bedtime for Dieting & Weight Loss (Patient not taking: Reported on 09/23/2019) 180 tablet 1   No current facility-administered medications on file prior to visit.    Current Problems (verified) Patient Active Problem List   Diagnosis Date Noted  . Type 2 diabetes mellitus with stage 2 chronic kidney disease, without long-term current use of insulin ([place]) 06/22/2019  . Hyperlipidemia associated with type 2 diabetes mellitus ([place]) 06/22/2019  . CKD stage 2 due to type 2 diabetes mellitus ([place]) 03/15/2019  . Unsteady gait 03/12/2018  . Gastroesophageal reflux disease without esophagitis 03/11/2018  . BMI 24.0-24.9, adult 08/11/2017  . Chronic obstructive pulmonary disease ([place]) 01/29/2017  . Encounter for Medicare annual wellness exam 03/09/2015  .  Diet-controlled diabetes mellitus ([place]) 08/16/2014  . Medication management 01/23/2014  . Essential hypertension 07/25/2013  . Hyperlipemia 07/25/2013  . Vitamin D deficiency 07/25/2013    Screening Tests Immunization History  Administered Date(s) Administered  . H1N1 06/28/2008  . Influenza Whole 05/04/2013  . Influenza, High Dose Seasonal PF 04/26/2014, 04/26/2015, 04/01/2016, 05/07/2017, 04/07/2019  . Influenza-Unspecified 04/14/2018  . Pneumococcal Conjugate-13 04/26/2014  . Pneumococcal Polysaccharide-23 07/10/2011  . Td 07/10/2011  . Zoster 06/28/2008   Preventative care: Last colonoscopy: 2005 declines another Last mammogram: bilateral mastectomy DEXA: 01/2016 - normal US transvaginal 08/2016  Prior vaccinations: TD or Tdap: 2012  Influenza: 2019 Pneumococcal:  2012 Prevnar13: 2015 Shingles/Zostavax: 2009  Names of Other Physician/Practitioners you currently use: 1. [facility] here for primary care 2. Dr. Gershon Crane, eye doctor, last visit 09/2017 HAD CATARACT SURGERY AND STATES SIGHT GOT WORSE 3., Dr. Lavone Neri dentist, last visit 09/2017, goes q59m  Patient Care Team: Unk Pinto, MD as PCP - General (Internal Medicine) Melissa Noon, OD (Optometry)  Allergies Allergies  Allergen Reactions  . Diltiazem     SURGICAL HISTORY She  has a past surgical history that includes Knee arthroscopy (Left); Appendectomy; Mastectomy (Bilateral); and Cataract extraction, bilateral (Bilateral, 09/2017). FAMILY HISTORY Her family history includes Heart attack in her father and sister; Heart disease in her father and mother; Stroke in her mother. SOCIAL HISTORY She  reports that she quit smoking about 26 years ago. She has never used smokeless tobacco. She reports that she does not drink alcohol or use drugs.  MEDICARE WELLNESS OBJECTIVES: Physical activity: Current Exercise Habits: Home exercise routine, Intensity: Mild Cardiac risk factors: Cardiac Risk Factors include: advanced age (>93men, >67 women);hypertension;dyslipidemia;diabetes mellitus Depression/mood screen:   Depression screen [facility] 2/9 09/23/2019  Decreased Interest 0  Down, Depressed, Hopeless 0  PHQ - 2 Score 0    ADLs:  In your present state of health, do you have any difficulty performing the following activities: 09/23/2019 06/22/2019  Hearing? N N  Vision? N N  Difficulty concentrating or making decisions? N N  Walking or climbing stairs? N N  Dressing or bathing? N N  Doing errands, shopping? N N  Preparing Food and eating ? N -  Using the Toilet? N -  In the past six months, have you accidently leaked urine? N -  Do you have problems with loss of bowel control? N -  Managing your Medications? N -  Managing your Finances? N -  Housekeeping  or managing your Housekeeping? N -  Some recent data might be hidden     Cognitive Testing  Alert? Yes  Normal Appearance?Yes  Oriented to person? Yes  Place? Yes   Time? Yes  Recall of three objects?  Yes  Can perform simple calculations? Yes  Displays appropriate judgment?Yes  Can read the correct time from a watch face?Yes  EOL planning: Does Patient Have a Medical Advance Directive?: Yes Type of Advance Directive: Healthcare Power of Attorney, Living will [place] in Chart?: No - copy requested  Review of Systems  Constitutional: Negative for chills, fever, malaise/fatigue and weight loss.  HENT: Negative for congestion, ear pain, hearing loss, sore throat and tinnitus.   Eyes: Negative.  Negative for blurred vision and double vision.  Respiratory: Negative for cough, sputum production, shortness of breath and wheezing.   Cardiovascular: Negative for chest pain, palpitations, orthopnea, claudication, leg swelling and PND.  Gastrointestinal: Negative for abdominal pain, blood in stool,  constipation, diarrhea, heartburn, melena, nausea and vomiting.  Genitourinary: Negative.   Musculoskeletal: Negative for falls, joint pain and myalgias.  Skin: Negative.  Negative for rash.  Neurological: Negative for dizziness, tingling, sensory change, loss of consciousness, weakness and headaches.  Endo/Heme/Allergies: Negative for polydipsia.  Psychiatric/Behavioral: Negative.  Negative for depression, memory loss, substance abuse and suicidal ideas. The patient is not nervous/anxious and does not have insomnia.   All other systems reviewed and are negative.    Objective:     Today's Vitals   09/23/19 0847  BP: 124/80  Pulse: 62  Temp: 97.6 F (36.4 C)  SpO2: 98%  Weight: 149 lb (67.6 kg)  Height: 5\' 3"  (1.6 m)   Body mass index is 26.39 kg/m.  General appearance: alert, no distress, WD/WN, female HEENT: normocephalic, sclerae anicteric, TMs pearly,  nares patent, no discharge or erythema, pharynx normal Oral cavity: MMM, no lesions Neck: supple, no lymphadenopathy, no thyromegaly, no masses Heart: RRR, normal S1, S2, no murmurs Lungs: CTA bilaterally, no wheezes, rhonchi, or rales Abdomen: +bs, soft, non tender, non distended, no masses, no hepatomegaly, no splenomegaly Musculoskeletal: nontender, no swelling, no obvious deformity Extremities: no edema, no cyanosis, no clubbing Pulses: 2+ symmetric, upper and lower extremities, normal cap refill Neurological: alert, oriented x 3, CN2-12 intact, strength normal upper extremities, LLE mildly weak, sensation normal throughout, DTRs 2+ throughout, no cerebellar signs, gait slow Psychiatric: normal affect, behavior normal, pleasant   Medicare Attestation I have personally reviewed: The patient's medical and social history Their use of alcohol, tobacco or illicit drugs Their current medications and supplements The patient's functional ability including ADLs,fall risks, home safety risks, cognitive, and hearing and visual impairment Diet and physical activities Evidence for depression or mood disorders  The patient's weight, height, BMI, and visual acuity have been recorded in the chart.  I have made referrals, counseling, and provided education to the patient based on review of the above and I have provided the patient with a written personalized care plan for preventive services.     Garnet Sierras, NP   09/23/2019

## 2019-09-23 ENCOUNTER — Other Ambulatory Visit: Payer: Self-pay

## 2019-09-23 ENCOUNTER — Ambulatory Visit (INDEPENDENT_AMBULATORY_CARE_PROVIDER_SITE_OTHER): Payer: PPO | Admitting: Adult Health Nurse Practitioner

## 2019-09-23 ENCOUNTER — Encounter: Payer: Self-pay | Admitting: Adult Health Nurse Practitioner

## 2019-09-23 VITALS — BP 124/80 | HR 62 | Temp 97.6°F | Ht 63.0 in | Wt 149.0 lb

## 2019-09-23 DIAGNOSIS — E663 Overweight: Secondary | ICD-10-CM

## 2019-09-23 DIAGNOSIS — E1122 Type 2 diabetes mellitus with diabetic chronic kidney disease: Secondary | ICD-10-CM

## 2019-09-23 DIAGNOSIS — R6889 Other general symptoms and signs: Secondary | ICD-10-CM

## 2019-09-23 DIAGNOSIS — I10 Essential (primary) hypertension: Secondary | ICD-10-CM

## 2019-09-23 DIAGNOSIS — J449 Chronic obstructive pulmonary disease, unspecified: Secondary | ICD-10-CM

## 2019-09-23 DIAGNOSIS — Z0001 Encounter for general adult medical examination with abnormal findings: Secondary | ICD-10-CM | POA: Diagnosis not present

## 2019-09-23 DIAGNOSIS — Z0000 Encounter for general adult medical examination without abnormal findings: Secondary | ICD-10-CM

## 2019-09-23 DIAGNOSIS — E1169 Type 2 diabetes mellitus with other specified complication: Secondary | ICD-10-CM | POA: Diagnosis not present

## 2019-09-23 DIAGNOSIS — K219 Gastro-esophageal reflux disease without esophagitis: Secondary | ICD-10-CM

## 2019-09-23 DIAGNOSIS — N182 Chronic kidney disease, stage 2 (mild): Secondary | ICD-10-CM

## 2019-09-23 DIAGNOSIS — E785 Hyperlipidemia, unspecified: Secondary | ICD-10-CM

## 2019-09-23 DIAGNOSIS — Z79899 Other long term (current) drug therapy: Secondary | ICD-10-CM | POA: Diagnosis not present

## 2019-09-23 DIAGNOSIS — E559 Vitamin D deficiency, unspecified: Secondary | ICD-10-CM | POA: Diagnosis not present

## 2019-09-24 LAB — CBC WITH DIFFERENTIAL/PLATELET
Absolute Monocytes: 557 cells/uL (ref 200–950)
Basophils Absolute: 21 cells/uL (ref 0–200)
Basophils Relative: 0.4 %
Eosinophils Absolute: 265 cells/uL (ref 15–500)
Eosinophils Relative: 5 %
HCT: 46 % — ABNORMAL HIGH (ref 35.0–45.0)
Hemoglobin: 15.4 g/dL (ref 11.7–15.5)
Lymphs Abs: 1542 cells/uL (ref 850–3900)
MCH: 32.9 pg (ref 27.0–33.0)
MCHC: 33.5 g/dL (ref 32.0–36.0)
MCV: 98.3 fL (ref 80.0–100.0)
MPV: 11.3 fL (ref 7.5–12.5)
Monocytes Relative: 10.5 %
Neutro Abs: 2915 cells/uL (ref 1500–7800)
Neutrophils Relative %: 55 %
Platelets: 175 10*3/uL (ref 140–400)
RBC: 4.68 10*6/uL (ref 3.80–5.10)
RDW: 12.9 % (ref 11.0–15.0)
Total Lymphocyte: 29.1 %
WBC: 5.3 10*3/uL (ref 3.8–10.8)

## 2019-09-24 LAB — COMPLETE METABOLIC PANEL WITH GFR
AG Ratio: 2.4 (calc) (ref 1.0–2.5)
ALT: 23 U/L (ref 6–29)
AST: 24 U/L (ref 10–35)
Albumin: 4.5 g/dL (ref 3.6–5.1)
Alkaline phosphatase (APISO): 48 U/L (ref 37–153)
BUN: 17 mg/dL (ref 7–25)
CO2: 29 mmol/L (ref 20–32)
Calcium: 9.5 mg/dL (ref 8.6–10.4)
Chloride: 105 mmol/L (ref 98–110)
Creat: 0.71 mg/dL (ref 0.60–0.88)
GFR, Est African American: 93 mL/min/{1.73_m2} (ref >=60)
GFR, Est Non African American: 80 mL/min/{1.73_m2} (ref >=60)
Globulin: 1.9 g/dL (calc) (ref 1.9–3.7)
Glucose, Bld: 109 mg/dL — ABNORMAL HIGH (ref 65–99)
Potassium: 4.6 mmol/L (ref 3.5–5.3)
Sodium: 141 mmol/L (ref 135–146)
Total Bilirubin: 1.1 mg/dL (ref 0.2–1.2)
Total Protein: 6.4 g/dL (ref 6.1–8.1)

## 2019-09-24 LAB — LIPID PANEL
Cholesterol: 143 mg/dL (ref <200)
HDL: 35 mg/dL — ABNORMAL LOW (ref >=50)
LDL Cholesterol (Calc): 79 mg/dL (calc)
Non-HDL Cholesterol (Calc): 108 mg/dL (calc) (ref <130)
Total CHOL/HDL Ratio: 4.1 (calc) (ref <5.0)
Triglycerides: 195 mg/dL — ABNORMAL HIGH (ref <150)

## 2019-09-24 LAB — HEMOGLOBIN A1C
Hgb A1c MFr Bld: 5.9 % of total Hgb — ABNORMAL HIGH (ref <5.7)
Mean Plasma Glucose: 123 (calc)
eAG (mmol/L): 6.8 (calc)

## 2019-12-06 NOTE — Progress Notes (Signed)
History of Present Illness:     Patient is a very nice 84 yo MWFd who presents with c/o concerns re: suspected insect bite along the proximal nail bed of the left Long / middle finger several weeks ago while working in  Her yard & garden.                            Medications   Current Outpatient Medications (Cardiovascular):  .  atenolol (TENORMIN) 100 MG tablet, Take 1 tablet Daily for BP .  atorvastatin (LIPITOR) 80 MG tablet, Take 1/2  tablet daily as directed for cholesterol .  lisinopril (ZESTRIL) 20 MG tablet, Take 1 tablet Daily for BP .  verapamil (CALAN) 120 MG tablet, Take 1 tablet 2 x /day for BP   Current Outpatient Medications (Analgesics):  .  aspirin 81 MG tablet, Take 81 mg by mouth daily. .  meloxicam (MOBIC) 15 MG tablet, Take 1/2 to 1 tablet daily with food for Pain / Inflammation & Limit to 5 days/week to prevent Kidney Damage  Current Outpatient Medications (Hematological):  .  vitamin B-12 (CYANOCOBALAMIN) 1000 MCG tablet, Take 1,000 mcg by mouth daily.  Current Outpatient Medications (Other):  Marland Kitchen  Bacillus Coagulans-Inulin (PROBIOTIC FORMULA PO), Take by mouth daily. .  Cholecalciferol (VITAMIN D PO), Take 5,000 Units by mouth 2 (two) times daily.  .  famotidine (PEPCID) 20 MG tablet, Take 1 tablet (20 mg total) by mouth 2 (two) times daily as needed for heartburn or indigestion. .  Multiple Vitamin (MULTIVITAMIN) tablet, Take 1 tablet by mouth daily. .  Multiple Vitamins-Minerals (HAIR SKIN AND NAILS FORMULA PO), Take by mouth daily. .  phentermine (ADIPEX-P) 37.5 MG tablet, Take 1 tablet every Morning for Dieting & Weight Loss .  topiramate (TOPAMAX) 50 MG tablet, Take 1/2 to 1 tablet 2 x /day at Suppertime & Bedtime for Dieting & Weight Loss  Problem list She has Essential hypertension; Hyperlipemia; Vitamin D deficiency; Medication management; Diet-controlled diabetes mellitus ([place]); Encounter for Medicare annual wellness exam; Chronic obstructive  pulmonary disease ([place]); BMI 24.0-24.9, adult; Gastroesophageal reflux disease without esophagitis; Unsteady gait; CKD stage 2 due to type 2 diabetes mellitus ([place]); Type 2 diabetes mellitus with stage 2 chronic kidney disease, without long-term current use of insulin (HCC); and Hyperlipidemia associated with type 2 diabetes mellitus ([place]) on their problem list.   Observations/Objective:  BP 130/72   Pulse 64   Temp 97.6 F (36.4 C)   Resp 16   Ht 5\' 3"  (1.6 m)   Wt 147 lb 12.8 oz (67 kg)   BMI 26.18 kg/m   Exam focused to the Left long / middle finger finds an area just proximal to the lateral cuticle that appears to be scar tissue which is gently debrided with a #11 scalpel & no FB is identified. There is also  Slight "puffiness" along the cuticle, but no purulent material could be expressed  Assessment and Plan:  1. Insect bite of left middle finger, initial encounter  -  Observaton.       I discussed the assessment and treatment plan with the patient. The patient was provided an opportunity to ask questions and all were answered. The patient agreed with the plan and demonstrated an understanding of the instructions.      The patient was advised to call back or seek an in-person evaluation if the symptoms worsen or if the condition fails to improve as anticipated.  Kirtland Bouchard, MD

## 2019-12-07 ENCOUNTER — Other Ambulatory Visit: Payer: Self-pay

## 2019-12-07 ENCOUNTER — Ambulatory Visit (INDEPENDENT_AMBULATORY_CARE_PROVIDER_SITE_OTHER): Payer: PPO | Admitting: Internal Medicine

## 2019-12-07 ENCOUNTER — Encounter: Payer: Self-pay | Admitting: Internal Medicine

## 2019-12-07 VITALS — BP 130/72 | HR 64 | Temp 97.6°F | Resp 16 | Ht 63.0 in | Wt 147.8 lb

## 2019-12-07 DIAGNOSIS — W57XXXA Bitten or stung by nonvenomous insect and other nonvenomous arthropods, initial encounter: Secondary | ICD-10-CM | POA: Diagnosis not present

## 2019-12-07 DIAGNOSIS — S60463A Insect bite (nonvenomous) of left middle finger, initial encounter: Secondary | ICD-10-CM

## 2019-12-24 ENCOUNTER — Ambulatory Visit: Payer: PPO | Admitting: Internal Medicine

## 2019-12-30 ENCOUNTER — Encounter: Payer: Self-pay | Admitting: Internal Medicine

## 2019-12-30 NOTE — Patient Instructions (Signed)

## 2019-12-30 NOTE — Progress Notes (Signed)
Annual Screening/Preventative Visit & Comprehensive Evaluation &  Examination     This very nice 84 y.o.  MWF  presents for a Screening /Preventative Visit & comprehensive evaluation and management of multiple medical co-morbidities.  Patient has been followed for HTN, HLD, T2_NIDDM   and Vitamin D Deficiency.      HTN predates scirca 1992. Patient's BP has been controlled at home and patient denies any cardiac symptoms as chest pain, palpitations, shortness of breath, dizziness or ankle swelling. Today's BP is at goal - 130/72.      Patient's hyperlipidemia is controlled with diet and Atorvastatin. Patient denies myalgias or other medication SE's. Last lipids were at goal with slightly elevated Trig's:  Lab Results  Component Value Date   CHOL 143 09/23/2019   HDL 35 (L) 09/23/2019   LDLCALC 79 09/23/2019   TRIG 195 (H) 09/23/2019   CHOLHDL 4.1 09/23/2019       Patient has hx/o T2_NIDDM w/CKD2 (A1c 6.5% / 2009 & 2012 and 6.4% / 2016)  predating since 2009 and patient denies reactive hypoglycemic symptoms, visual blurring, diabetic polys or paresthesias.  In 2020, via better dieting & 30# weight loss, patient has been able to stop her Metformin & main blood sugars by diet alone. Last  A1c was near goal:  Lab Results  Component Value Date   HGBA1C 5.9 (H) 09/23/2019       Finally, patient has history of Vitamin D Deficiency (A1c 6.5% / 2009 & 2012 and 6.4% / 2016)  and last Vitamin D was at goal:  Lab Results  Component Value Date   VD25OH 91 06/23/2019    Current Outpatient Medications on File Prior to Visit  Medication Sig  . aspirin 81 MG tablet Take 81 mg by mouth daily.  Marland Kitchen atenolol (TENORMIN) 100 MG tablet Take 1 tablet Daily for BP  . atorvastatin (LIPITOR) 80 MG tablet Take 1/2  tablet daily as directed for cholesterol  . Bacillus Coagulans-Inulin (PROBIOTIC FORMULA PO) Take by mouth daily.  . Cholecalciferol (VITAMIN D PO) Take 5,000 Units by mouth 2 (two) times daily.    . famotidine (PEPCID) 20 MG tablet Take 1 tablet (20 mg total) by mouth 2 (two) times daily as needed for heartburn or indigestion.  Marland Kitchen lisinopril (ZESTRIL) 20 MG tablet Take 1 tablet Daily for BP  . meloxicam (MOBIC) 15 MG tablet Take 1/2 to 1 tablet daily with food for Pain / Inflammation & Limit to 5 days/week to prevent Kidney Damage  . Multiple Vitamin (MULTIVITAMIN) tablet Take 1 tablet by mouth daily.  . Multiple Vitamins-Minerals (HAIR SKIN AND NAILS FORMULA PO) Take by mouth daily.  . phentermine (ADIPEX-P) 37.5 MG tablet Take 1 tablet every Morning for Dieting & Weight Loss  . topiramate (TOPAMAX) 50 MG tablet Take 1/2 to 1 tablet 2 x /day at Suppertime & Bedtime for Dieting & Weight Loss  . verapamil (CALAN) 120 MG tablet Take 1 tablet 2 x /day for BP  . vitamin B-12 (CYANOCOBALAMIN) 1000 MCG tablet Take 1,000 mcg by mouth daily.   No current facility-administered medications on file prior to visit.   Allergies  Allergen Reactions  . Diltiazem    Past Medical History:  Diagnosis Date  . DDD (degenerative disc disease), lumbar   . Fatty liver disease, nonalcoholic    CT AB 1308  . Hyperlipidemia   . Hypertension   . Type II or unspecified type diabetes mellitus without mention of complication, not stated as uncontrolled   .  Vitamin D deficiency    Health Maintenance  Topic Date Due  . OPHTHALMOLOGY EXAM  02/18/2019  . INFLUENZA VACCINE  02/13/2020  . HEMOGLOBIN A1C  03/25/2020  . FOOT EXAM  12/29/2020  . TETANUS/TDAP  07/09/2021  . DEXA SCAN  Completed  . COVID-19 Vaccine  Completed  . PNA vac Low Risk Adult  Completed   Immunization History  Administered Date(s) Administered  . H1N1 06/28/2008  . Influenza Whole 05/04/2013  . Influenza, High Dose Seasonal PF 04/26/2014, 04/26/2015, 04/01/2016, 05/07/2017, 04/07/2019  . Influenza-Unspecified 04/14/2018  . PFIZER SARS-COV-2 Vaccination 07/28/2019, 08/28/2019  . Pneumococcal Conjugate-13 04/26/2014  .  Pneumococcal Polysaccharide-23 07/10/2011  . Td 07/10/2011  . Zoster 06/28/2008   Last Colon -  08/2003 - Dr Delfin Edis - Diverticulosis  Last MGM -  2003 - Bilat sub-cutaneous ressections  Past Surgical History:  Procedure Laterality Date  . APPENDECTOMY    . CATARACT EXTRACTION, BILATERAL Bilateral 09/2017   Dr. Gershon Crane  . KNEE ARTHROSCOPY Left   . MASTECTOMY Bilateral    30 years ago   Family History  Problem Relation Age of Onset  . Stroke Mother   . Heart disease Mother   . Heart disease Father   . Heart attack Father   . Heart attack Sister    Social History   Tobacco Use  . Smoking status: Former Smoker    Quit date: 07/15/1993    Years since quitting: 26.4  . Smokeless tobacco: Never Used  Substance Use Topics  . Alcohol use: No  . Drug use: No    ROS Constitutional: Denies fever, chills, weight loss/gain, headaches, insomnia,  night sweats, and change in appetite. Does c/o fatigue. Eyes: Denies redness, blurred vision, diplopia, discharge, itchy, watery eyes.  ENT: Denies discharge, congestion, post nasal drip, epistaxis, sore throat, earache, hearing loss, dental pain, Tinnitus, Vertigo, Sinus pain, snoring.  Cardio: Denies chest pain, palpitations, irregular heartbeat, syncope, dyspnea, diaphoresis, orthopnea, PND, claudication, edema Respiratory: denies cough, dyspnea, DOE, pleurisy, hoarseness, laryngitis, wheezing.  Gastrointestinal: Denies dysphagia, heartburn, reflux, water brash, pain, cramps, nausea, vomiting, bloating, diarrhea, constipation, hematemesis, melena, hematochezia, jaundice, hemorrhoids Genitourinary: Denies dysuria, frequency, urgency, nocturia, hesitancy, discharge, hematuria, flank pain Breast: Breast lumps, nipple discharge, bleeding.  Musculoskeletal: Denies arthralgia, myalgia, stiffness, Jt. Swelling, pain, limp, and strain/sprain. Denies falls. Skin: Denies puritis, rash, hives, warts, acne, eczema, changing in skin lesion Neuro: No  weakness, tremor, incoordination, spasms, paresthesia, pain Psychiatric: Denies confusion, memory loss, sensory loss. Denies Depression. Endocrine: Denies change in weight, skin, hair change, nocturia, and paresthesia, diabetic polys, visual blurring, hyper / hypo glycemic episodes.  Heme/Lymph: No excessive bleeding, bruising, enlarged lymph nodes.  Physical Exam  BP 130/72   Pulse 72   Temp 97.8 F (36.6 C)   Resp 16   Ht 5\' 3"  (1.6 m)   Wt 147 lb 3.2 oz (66.8 kg)   BMI 26.08 kg/m   General Appearance: Well nourished, well groomed and in no apparent distress.  Eyes: PERRLA, EOMs, conjunctiva no swelling or erythema, normal fundi and vessels. Sinuses: No frontal/maxillary tenderness ENT/Mouth: EACs patent / TMs  nl. Nares clear without erythema, swelling, mucoid exudates. Oral hygiene is good. No erythema, swelling, or exudate. Tongue normal, non-obstructing. Tonsils not swollen or erythematous. Hearing normal.  Neck: Supple, thyroid not palpable. No bruits, nodes or JVD. Respiratory: Respiratory effort normal.  BS equal and clear bilateral without rales, rhonci, wheezing or stridor. Cardio: Heart sounds are normal with regular rate and rhythm and  no murmurs, rubs or gallops. Peripheral pulses are normal and equal bilaterally without edema. No aortic or femoral bruits. Chest: symmetric with normal excursions and percussion. Breasts: Symmetric, without lumps, nipple discharge, retractions, or fibrocystic changes.  Abdomen: Flat, soft with bowel sounds active. Nontender, no guarding, rebound, hernias, masses, or organomegaly.  Lymphatics: Non tender without lymphadenopathy.  Musculoskeletal: Full ROM all peripheral extremities, joint stability, 5/5 strength, and normal gait. Skin: Warm and dry without rashes, lesions, cyanosis, clubbing or  ecchymosis.  Neuro: Cranial nerves intact, reflexes equal bilaterally. Normal muscle tone, no cerebellar symptoms. Sensation intact.  Pysch: Alert  and oriented X 3, normal affect, Insight and Judgment appropriate.   Assessment and Plan  1. Annual Preventative Screening Examination  2. Essential hypertension  - EKG 12-Lead - Urinalysis, Routine w reflex microscopic - Microalbumin / creatinine urine ratio - CBC with Differential/Platelet - COMPLETE METABOLIC PANEL WITH GFR - Magnesium - TSH  3. Hyperlipidemia associated with type 2 diabetes mellitus (HCC)  - EKG 12-Lead - HM DIABETES FOOT EXAM - LOW EXTREMITY NEUR EXAM DOCUM - Lipid panel - Hemoglobin A1c - Insulin, random  4. Type 2 diabetes mellitus with stage 2 chronic kidney disease,  without long-term current use of insulin (HCC)  - EKG 12-Lead - HM DIABETES FOOT EXAM - LOW EXTREMITY NEUR EXAM DOCUM - Hemoglobin A1c - Insulin, random  5. Vitamin D deficiency  - VITAMIN D 25 Hydroxyl  6. Gastroesophageal reflux disease   7. Screening for colorectal cancer  - POC Hemoccult Bld/Stl  8. Screening for ischemic heart disease  - EKG 12-Lead  9. FHx: heart disease  - EKG 12-Lead - Urinalysis, Routine w reflex microscopic  10. Former smoker  - EKG 12-Lead - Urinalysis, Routine w reflex microscopic  11. Medication management - EKG 12-Lead - Urinalysis, Routine w reflex microscopic - Microalbumin / creatinine urine ratio - CBC with Differential/Platelet - COMPLETE METABOLIC PANEL WITH GFR - Magnesium - Lipid panel - TSH - Hemoglobin A1c - Insulin, random - VITAMIN D 25 Hydroxyl  12. Radiculitis of right cervical region  - Ambulatory referral to Orthopedic Surgery             Patient was counseled in prudent diet to achieve/maintain BMI less than 25 for weight control, BP monitoring, regular exercise and medications. Discussed med's effects and SE's. Screening labs and tests as requested with regular follow-up as recommended. Over 40 minutes of exam, counseling, chart review and high complex critical decision making was performed.   Kirtland Bouchard, MD

## 2019-12-31 ENCOUNTER — Other Ambulatory Visit: Payer: Self-pay

## 2019-12-31 ENCOUNTER — Ambulatory Visit (INDEPENDENT_AMBULATORY_CARE_PROVIDER_SITE_OTHER): Payer: PPO | Admitting: Internal Medicine

## 2019-12-31 VITALS — BP 130/72 | HR 72 | Temp 97.8°F | Resp 16 | Ht 63.0 in | Wt 147.2 lb

## 2019-12-31 DIAGNOSIS — Z8249 Family history of ischemic heart disease and other diseases of the circulatory system: Secondary | ICD-10-CM

## 2019-12-31 DIAGNOSIS — Z87891 Personal history of nicotine dependence: Secondary | ICD-10-CM

## 2019-12-31 DIAGNOSIS — Z136 Encounter for screening for cardiovascular disorders: Secondary | ICD-10-CM

## 2019-12-31 DIAGNOSIS — Z0000 Encounter for general adult medical examination without abnormal findings: Secondary | ICD-10-CM | POA: Diagnosis not present

## 2019-12-31 DIAGNOSIS — Z1211 Encounter for screening for malignant neoplasm of colon: Secondary | ICD-10-CM

## 2019-12-31 DIAGNOSIS — E559 Vitamin D deficiency, unspecified: Secondary | ICD-10-CM

## 2019-12-31 DIAGNOSIS — E785 Hyperlipidemia, unspecified: Secondary | ICD-10-CM

## 2019-12-31 DIAGNOSIS — E1169 Type 2 diabetes mellitus with other specified complication: Secondary | ICD-10-CM

## 2019-12-31 DIAGNOSIS — I10 Essential (primary) hypertension: Secondary | ICD-10-CM | POA: Diagnosis not present

## 2019-12-31 DIAGNOSIS — Z0001 Encounter for general adult medical examination with abnormal findings: Secondary | ICD-10-CM

## 2019-12-31 DIAGNOSIS — Z1212 Encounter for screening for malignant neoplasm of rectum: Secondary | ICD-10-CM

## 2019-12-31 DIAGNOSIS — K219 Gastro-esophageal reflux disease without esophagitis: Secondary | ICD-10-CM

## 2019-12-31 DIAGNOSIS — Z79899 Other long term (current) drug therapy: Secondary | ICD-10-CM | POA: Diagnosis not present

## 2019-12-31 DIAGNOSIS — M5412 Radiculopathy, cervical region: Secondary | ICD-10-CM

## 2019-12-31 DIAGNOSIS — E1122 Type 2 diabetes mellitus with diabetic chronic kidney disease: Secondary | ICD-10-CM | POA: Diagnosis not present

## 2019-12-31 DIAGNOSIS — N182 Chronic kidney disease, stage 2 (mild): Secondary | ICD-10-CM | POA: Diagnosis not present

## 2019-12-31 MED ORDER — GABAPENTIN 100 MG PO CAPS: ORAL_CAPSULE | ORAL | 2 refills | Status: DC

## 2020-01-01 ENCOUNTER — Encounter: Payer: Self-pay | Admitting: Internal Medicine

## 2020-01-01 NOTE — Progress Notes (Addendum)
=======================================================  -    Total Chol = 142 - and LDL = 77, Both Excellent   - Very low risk for Heart Attack  / Stroke =======================================================  -  A1c = 5.7% - Still elevated in the borderline and  early or pre-diabetes range which has the same   300% increased risk for heart attack, stroke, cancer and   alzheimer- type vascular dementia as full blown diabetes.   But the good news is that diet, exercise with  weight loss can cure the early diabetes at this point.  ----------------------------------------------------------------------------------------------  - It is very important that you work harder with diet by  avoiding all foods that are white except chicken,   fish & calliflower.  - Avoid white rice  (brown & wild rice is OK),   - Avoid white potatoes  (sweet potatoes in moderation is OK),   White bread or wheat bread or anything made out of   white flour like bagels, donuts, rolls, buns, biscuits, cakes,  - pastries, cookies, pizza crust, and pasta (made from  white flour & egg whites)   - vegetarian pasta or spinach or wheat pasta is OK.  - Multigrain breads like Arnold's, Pepperidge Farm or   multigrain sandwich thins or high fiber breads like   [place] bread or "Dave[employer] Killer" breads that are  4 to 5 grams fiber per slice !  are best.    Diet, exercise and weight loss can reverse and cure  diabetes in the early stages.    - Diet, exercise and weight loss is very important in the   control and prevention of complications of diabetes which  affects every system in your body =======================================================  -  Vitamin D = 66 - Excellent  =======================================================  -  All Else - CBC - Kidneys - Electrolytes -  Liver - Magnesium & Thyroid  - all  Normal / OK =======================================================

## 2020-01-03 LAB — COMPLETE METABOLIC PANEL WITH GFR
AG Ratio: 2.5 (calc) (ref 1.0–2.5)
ALT: 23 U/L (ref 6–29)
AST: 23 U/L (ref 10–35)
Albumin: 4.5 g/dL (ref 3.6–5.1)
Alkaline phosphatase (APISO): 48 U/L (ref 37–153)
BUN/Creatinine Ratio: 41 (calc) — ABNORMAL HIGH (ref 6–22)
BUN: 29 mg/dL — ABNORMAL HIGH (ref 7–25)
CO2: 25 mmol/L (ref 20–32)
Calcium: 9.1 mg/dL (ref 8.6–10.4)
Chloride: 106 mmol/L (ref 98–110)
Creat: 0.7 mg/dL (ref 0.60–0.88)
GFR, Est African American: 94 mL/min/{1.73_m2} (ref >=60)
GFR, Est Non African American: 81 mL/min/{1.73_m2} (ref >=60)
Globulin: 1.8 g/dL (calc) — ABNORMAL LOW (ref 1.9–3.7)
Glucose, Bld: 101 mg/dL — ABNORMAL HIGH (ref 65–99)
Potassium: 4.2 mmol/L (ref 3.5–5.3)
Sodium: 139 mmol/L (ref 135–146)
Total Bilirubin: 1 mg/dL (ref 0.2–1.2)
Total Protein: 6.3 g/dL (ref 6.1–8.1)

## 2020-01-03 LAB — CBC WITH DIFFERENTIAL/PLATELET
Absolute Monocytes: 501 cells/uL (ref 200–950)
Basophils Absolute: 41 cells/uL (ref 0–200)
Basophils Relative: 0.9 %
Eosinophils Absolute: 189 cells/uL (ref 15–500)
Eosinophils Relative: 4.1 %
HCT: 41.7 % (ref 35.0–45.0)
Hemoglobin: 13.9 g/dL (ref 11.7–15.5)
Lymphs Abs: 1385 cells/uL (ref 850–3900)
MCH: 32.9 pg (ref 27.0–33.0)
MCHC: 33.3 g/dL (ref 32.0–36.0)
MCV: 98.8 fL (ref 80.0–100.0)
MPV: 10.7 fL (ref 7.5–12.5)
Monocytes Relative: 10.9 %
Neutro Abs: 2484 cells/uL (ref 1500–7800)
Neutrophils Relative %: 54 %
Platelets: 151 10*3/uL (ref 140–400)
RBC: 4.22 10*6/uL (ref 3.80–5.10)
RDW: 12.7 % (ref 11.0–15.0)
Total Lymphocyte: 30.1 %
WBC: 4.6 10*3/uL (ref 3.8–10.8)

## 2020-01-03 LAB — URINALYSIS, ROUTINE W REFLEX MICROSCOPIC
Bilirubin Urine: NEGATIVE
Glucose, UA: NEGATIVE
Hgb urine dipstick: NEGATIVE
Ketones, ur: NEGATIVE
Leukocytes,Ua: NEGATIVE
Nitrite: NEGATIVE
Protein, ur: NEGATIVE
Specific Gravity, Urine: 1.023 (ref 1.001–1.03)
pH: <=5 (ref 5.0–8.0)

## 2020-01-03 LAB — HEMOGLOBIN A1C
Hgb A1c MFr Bld: 5.7 % of total Hgb — ABNORMAL HIGH (ref <5.7)
Mean Plasma Glucose: 117 (calc)
eAG (mmol/L): 6.5 (calc)

## 2020-01-03 LAB — LIPID PANEL
Cholesterol: 142 mg/dL (ref <200)
HDL: 38 mg/dL — ABNORMAL LOW (ref >=50)
LDL Cholesterol (Calc): 77 mg/dL (calc)
Non-HDL Cholesterol (Calc): 104 mg/dL (calc) (ref <130)
Total CHOL/HDL Ratio: 3.7 (calc) (ref <5.0)
Triglycerides: 168 mg/dL — ABNORMAL HIGH (ref <150)

## 2020-01-03 LAB — MAGNESIUM: Magnesium: 2.2 mg/dL (ref 1.5–2.5)

## 2020-01-03 LAB — VITAMIN D 25 HYDROXY (VIT D DEFICIENCY, FRACTURES): Vit D, 25-Hydroxy: 66 ng/mL (ref 30–100)

## 2020-01-03 LAB — MICROALBUMIN / CREATININE URINE RATIO
Creatinine, Urine: 80 mg/dL (ref 20–275)
Microalb Creat Ratio: 3 mcg/mg creat (ref <30)
Microalb, Ur: 0.2 mg/dL

## 2020-01-03 LAB — TSH: TSH: 2.94 mIU/L (ref 0.40–4.50)

## 2020-01-03 LAB — INSULIN, RANDOM: Insulin: 12.5 u[IU]/mL

## 2020-02-29 ENCOUNTER — Other Ambulatory Visit: Payer: Self-pay | Admitting: Internal Medicine

## 2020-02-29 DIAGNOSIS — I10 Essential (primary) hypertension: Secondary | ICD-10-CM

## 2020-02-29 DIAGNOSIS — K219 Gastro-esophageal reflux disease without esophagitis: Secondary | ICD-10-CM

## 2020-02-29 MED ORDER — ATENOLOL 100 MG PO TABS: ORAL_TABLET | ORAL | 0 refills | Status: DC

## 2020-02-29 MED ORDER — FAMOTIDINE 20 MG PO TABS: ORAL_TABLET | ORAL | 0 refills | Status: DC

## 2020-03-09 DIAGNOSIS — H524 Presbyopia: Secondary | ICD-10-CM | POA: Diagnosis not present

## 2020-03-09 DIAGNOSIS — Z961 Presence of intraocular lens: Secondary | ICD-10-CM | POA: Diagnosis not present

## 2020-03-09 DIAGNOSIS — H52203 Unspecified astigmatism, bilateral: Secondary | ICD-10-CM | POA: Diagnosis not present

## 2020-04-05 NOTE — Progress Notes (Signed)
FOLLOW UP  Assessment and Plan:   Hypertension Well controlled with current medications  Monitor blood pressure at home; patient to call if consistently greater than 130/80 Continue DASH diet.   Reminder to go to the ER if any CP, SOB, nausea, dizziness, severe HA, changes vision/speech, left arm numbness and tingling and jaw pain.  Cholesterol Currently at LDL goal; continue statin; consider taper down on dose if A1C remains well controlled off of metformin  Continue low cholesterol diet and exercise.  Check lipid panel.   Diabetes with diabetic chronic kidney disease Recently with normalized A1Cs following med assisted weight loss Has tapered off of metformin and still doing well  Continue diet and exercise.  Perform daily foot/skin check, notify office of any concerning changes.  Check A1C  Overweight Long discussion about weight loss, diet, and exercise Recommended diet heavy in fruits and veggies and low in animal meats, cheeses, and dairy products, appropriate calorie intake Discussed ideal weight for height - would like to maintain <140 lb, monitoring closely at home Patient will work on limiting snacks/sweets, increase intentional exercise Will follow up in 3 months  Vitamin D Def At goal at last visit; continue supplementation to maintain goal of 60-100 Defer Vit D level  GERD Symptoms well managed without breakthrough  Tingling of upper extremities Positional without neck pain or daytime sx;  Discussed changing sleeping position, change pillow If persistent follow up and will plan to pursue cervical imaging-  She may take gabapentin if needed to help with sleep but advised this will only treat the sx  Continue diet and meds as discussed. Further disposition pending results of labs. Discussed med's effects and SE's.   Over 30 minutes of exam, counseling, chart review, and critical decision making was performed.   Future Appointments  Date Time Provider  [place]  07/11/2020  9:30 AM Unk Pinto, MD [facility] None  10/05/2020  8:30 AM Garnet Sierras, NP [facility] None  01/03/2021 10:00 AM Unk Pinto, MD [facility] None    ----------------------------------------------------------------------------------------------------------------------  HPI 84 y.o. female  presents for 3 month follow up on hypertension, cholesterol, T2 diabetes, weight and vitamin D deficiency.   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   She does report having intermittent tingling of bil hands up to forearms that will occasionally wake her up at night. None during the day, denies neck pain.   she has a diagnosis of GERD which is currently managed by famotidine 20 mg BID.   BMI is Body mass index is 25.69 kg/m., she has been working on diet and exercise. Her weight is down from 168 lb on 06/17/2018 with assistance of phentermine and topamax and has successfully tapered off of diabetes medications.  Wt Readings from Last 3 Encounters:  04/06/20 145 lb (65.8 kg)  12/31/19 147 lb 3.2 oz (66.8 kg)  12/07/19 147 lb 12.8 oz (67 kg)   Her blood pressure has been controlled at home, today their BP is BP: 138/76  She does not workout. She denies chest pain, shortness of breath, dizziness.   She is on cholesterol medication (atorvastatin 40 mg daily) and denies myalgias. Her LDL cholesterol is not at goal. The cholesterol last visit was:   Lab Results  Component Value Date   CHOL 142 12/31/2019   HDL 38 (L) 12/31/2019   LDLCALC 77 12/31/2019   TRIG 168 (H) 12/31/2019   CHOLHDL 3.7 12/31/2019  She has not been working on diet and exercise for well controlled T2DM (A1C 6.5% in 2009, was on metformin, tapered off following improved A1Cs with weight loss, currently managed by lfiestyle only), and denies increased appetite, nausea, paresthesia of the feet, polydipsia, polyuria,  visual disturbances and vomiting. Last A1C in the office was:  Lab Results  Component Value Date   HGBA1C 5.7 (H) 12/31/2019   She is in CKD II range, on ACEi:  Lab Results  Component Value Date   GFRNONAA 81 12/31/2019   Patient is on Vitamin D supplement and at goal of 60 at recent check:    Lab Results  Component Value Date   VD25OH 66 12/31/2019       Current Medications:  Current Outpatient Medications on File Prior to Visit  Medication Sig   aspirin 81 MG tablet Take 81 mg by mouth daily.   atenolol (TENORMIN) 100 MG tablet Take 1 tablet Daily for BP   atorvastatin (LIPITOR) 80 MG tablet Take 1/2  tablet daily as directed for cholesterol   Cholecalciferol (VITAMIN D PO) Take 5,000 Units by mouth 2 (two) times daily.    famotidine (PEPCID) 20 MG tablet Take 1 tablet 2 x /day with Meals to Prevent Heartburn & Indigestion   gabapentin (NEURONTIN) 100 MG capsule Take 1 capsule 3 x /day as needed for Nerve Pain   lisinopril (ZESTRIL) 20 MG tablet Take 1 tablet Daily for BP   meloxicam (MOBIC) 15 MG tablet Take 1/2 to 1 tablet daily with food for Pain / Inflammation & Limit to 5 days/week to prevent Kidney Damage   Multiple Vitamin (MULTIVITAMIN) tablet Take 1 tablet by mouth daily.   Multiple Vitamins-Minerals (HAIR SKIN AND NAILS FORMULA PO) Take by mouth daily.   verapamil (CALAN) 120 MG tablet Take 1 tablet 2 x /day for BP   vitamin B-12 (CYANOCOBALAMIN) 1000 MCG tablet Take 1,000 mcg by mouth daily.   Bacillus Coagulans-Inulin (PROBIOTIC FORMULA PO) Take by mouth daily. (Patient not taking: Reported on 04/06/2020)   phentermine (ADIPEX-P) 37.5 MG tablet Take 1 tablet every Morning for Dieting & Weight Loss (Patient not taking: Reported on 04/06/2020)   topiramate (TOPAMAX) 50 MG tablet Take 1/2 to 1 tablet 2 x /day at Suppertime & Bedtime for Dieting & Weight Loss (Patient not taking: Reported on 04/06/2020)   No current facility-administered medications on file  prior to visit.     Allergies:  Allergies  Allergen Reactions   Diltiazem      Medical History:  Past Medical History:  Diagnosis Date   DDD (degenerative disc disease), lumbar    Fatty liver disease, nonalcoholic    CT AB 8786   Hyperlipidemia    Hypertension    Type II or unspecified type diabetes mellitus without mention of complication, not stated as uncontrolled    Vitamin D deficiency    Family history- Reviewed and unchanged Social history- Reviewed and unchanged   Review of Systems:  Review of Systems  Constitutional: Negative for malaise/fatigue and weight loss.  HENT: Negative for hearing loss and tinnitus.   Eyes: Negative for blurred vision and double vision.  Respiratory: Negative for cough, shortness of breath and wheezing.   Cardiovascular: Negative for chest pain, palpitations, orthopnea, claudication and leg swelling.  Gastrointestinal: Negative for abdominal pain, blood in stool, constipation, diarrhea, heartburn, melena, nausea and vomiting.  Genitourinary: Negative.   Musculoskeletal: Negative for joint pain and myalgias.  Skin: Negative for rash.  Neurological: Positive for tingling (  bil upper extremities only at night after lying flat for several hours). Negative for dizziness, sensory change, focal weakness, weakness and headaches.  Endo/Heme/Allergies: Negative for polydipsia.  Psychiatric/Behavioral: Negative.   All other systems reviewed and are negative.   Physical Exam: BP 138/76    Pulse (!) 54    Temp (!) 97.5 F (36.4 C)    Ht 5\' 3"  (1.6 m)    Wt 145 lb (65.8 kg)    SpO2 96%    BMI 25.69 kg/m  Wt Readings from Last 3 Encounters:  04/06/20 145 lb (65.8 kg)  12/31/19 147 lb 3.2 oz (66.8 kg)  12/07/19 147 lb 12.8 oz (67 kg)   General Appearance: Well nourished, in no apparent distress. Eyes: PERRLA, EOMs, conjunctiva no swelling or erythema Sinuses: No Frontal/maxillary tenderness ENT/Mouth: Ext aud canals clear, TMs without  erythema, bulging. No erythema, swelling, or exudate on post pharynx.  Tonsils not swollen or erythematous. Hearing normal.  Neck: Supple, thyroid normal.  Respiratory: Respiratory effort normal, BS equal bilaterally without rales, rhonchi, wheezing or stridor.  Cardio: RRR with no MRGs. Brisk peripheral pulses without edema.  Abdomen: Soft, + BS.  Non tender, no guarding, rebound, hernias, masses. Lymphatics: Non tender without lymphadenopathy.  Musculoskeletal: Full ROM, 5/5 strength, Normal gait. Neg spurling's Skin: Warm, dry without rashes, lesions, ecchymosis.  Neuro: Cranial nerves intact. No cerebellar symptoms.  Psych: Awake and oriented X 3, normal affect, Insight and Judgment appropriate.    Izora Ribas, NP 8:53 AM [facility]

## 2020-04-06 ENCOUNTER — Ambulatory Visit (INDEPENDENT_AMBULATORY_CARE_PROVIDER_SITE_OTHER): Payer: PPO | Admitting: Adult Health

## 2020-04-06 ENCOUNTER — Other Ambulatory Visit: Payer: Self-pay

## 2020-04-06 ENCOUNTER — Encounter: Payer: Self-pay | Admitting: Adult Health

## 2020-04-06 VITALS — BP 138/76 | HR 54 | Temp 97.5°F | Ht 63.0 in | Wt 145.0 lb

## 2020-04-06 DIAGNOSIS — E1122 Type 2 diabetes mellitus with diabetic chronic kidney disease: Secondary | ICD-10-CM

## 2020-04-06 DIAGNOSIS — E119 Type 2 diabetes mellitus without complications: Secondary | ICD-10-CM

## 2020-04-06 DIAGNOSIS — E663 Overweight: Secondary | ICD-10-CM | POA: Diagnosis not present

## 2020-04-06 DIAGNOSIS — E559 Vitamin D deficiency, unspecified: Secondary | ICD-10-CM

## 2020-04-06 DIAGNOSIS — J449 Chronic obstructive pulmonary disease, unspecified: Secondary | ICD-10-CM | POA: Diagnosis not present

## 2020-04-06 DIAGNOSIS — E1169 Type 2 diabetes mellitus with other specified complication: Secondary | ICD-10-CM

## 2020-04-06 DIAGNOSIS — N182 Chronic kidney disease, stage 2 (mild): Secondary | ICD-10-CM | POA: Diagnosis not present

## 2020-04-06 DIAGNOSIS — Z79899 Other long term (current) drug therapy: Secondary | ICD-10-CM

## 2020-04-06 DIAGNOSIS — E785 Hyperlipidemia, unspecified: Secondary | ICD-10-CM | POA: Diagnosis not present

## 2020-04-06 DIAGNOSIS — I10 Essential (primary) hypertension: Secondary | ICD-10-CM | POA: Diagnosis not present

## 2020-04-06 NOTE — Patient Instructions (Addendum)
Goals    . Blood Pressure < 140/90    . LDL CALC < 70    . Weight (lb) < 160 lb (72.6 kg)       Paresthesia Paresthesia is an abnormal burning or prickling sensation. It is usually felt in the hands, arms, legs, or feet. However, it may occur in any part of the body. Usually, paresthesia is not painful. It may feel like:  Tingling or numbness.  Buzzing.  Itching. Paresthesia may occur without any clear cause, or it may be caused by:  Breathing too quickly (hyperventilation).  Pressure on a nerve.  An underlying medical condition.  Side effects of a medication.  Nutritional deficiencies.  Exposure to toxic chemicals. Most people experience temporary (transient) paresthesia at some time in their lives. For some people, it may be long-lasting (chronic) because of an underlying medical condition. If you have paresthesia that lasts a long time, you may need to be evaluated by your health care provider. Follow these instructions at home: Alcohol use   Do not drink alcohol if: ? Your health care provider tells you not to drink. ? You are pregnant, may be pregnant, or are planning to become pregnant.  If you drink alcohol: ? Limit how much you use to:  0-1 drink a day for women.  0-2 drinks a day for men. ? Be aware of how much alcohol is in your drink. In the [place] one drink equals one 12 oz bottle of beer (355 mL), one 5 oz glass of wine (148 mL), or one 1 oz glass of hard liquor (44 mL). Nutrition   Eat a healthy diet. This includes: ? Eating foods that are high in fiber, such as fresh fruits and vegetables, whole grains, and beans. ? Limiting foods that are high in fat and processed sugars, such as fried or sweet foods. General instructions  Take over-the-counter and prescription medicines only as told by your health care provider.  Do not use any products that contain nicotine or tobacco, such as cigarettes and e-cigarettes. These can keep blood from reaching  damaged nerves. If you need help quitting, ask your health care provider.  If you have diabetes, work closely with your health care provider to keep your blood sugar under control.  If you have numbness in your feet: ? Check every day for signs of injury or infection. Watch for redness, warmth, and swelling. ? Wear padded socks and comfortable shoes. These help protect your feet.  Keep all follow-up visits as told by your health care provider. This is important. Contact a health care provider if you:  Have paresthesia that gets worse or does not go away.  Have a burning or prickling feeling that gets worse when you walk.  Have pain, cramps, or dizziness.  Develop a rash. Get help right away if you:  Feel weak.  Have trouble walking or moving.  Have problems with speech, understanding, or vision.  Feel confused.  Cannot control your bladder or bowel movements.  Have numbness after an injury.  Develop new weakness in an arm or leg.  Faint. Summary  Paresthesia is an abnormal burning or prickling sensation that is usually felt in the hands, arms, legs, or feet. It may also occur in other parts of the body.  Paresthesia may occur without any clear cause, or it may be caused by breathing too quickly (hyperventilation), pressure on a nerve, an underlying medical condition, side effects of a medication, nutritional deficiencies, or exposure to toxic  chemicals.  If you have paresthesia that lasts a long time, you may need to be evaluated by your health care provider. This information is not intended to replace advice given to you by your health care provider. Make sure you discuss any questions you have with your health care provider. Document Revised: 07/27/2018 Document Reviewed: 07/10/2017 Elsevier Patient Education  [place].      High-Fiber Diet Fiber, also called dietary fiber, is a type of carbohydrate that is found in fruits, vegetables, whole grains, and  beans. A high-fiber diet can have many health benefits. Your health care provider may recommend a high-fiber diet to help:  Prevent constipation. Fiber can make your bowel movements more regular.  Lower your cholesterol.  Relieve the following conditions: ? Swelling of veins in the anus (hemorrhoids). ? Swelling and irritation (inflammation) of specific areas of the digestive tract (uncomplicated diverticulosis). ? A problem of the large intestine (colon) that sometimes causes pain and diarrhea (irritable bowel syndrome, IBS).  Prevent overeating as part of a weight-loss plan.  Prevent heart disease, type 2 diabetes, and certain cancers. What is my plan? The recommended daily fiber intake in grams (g) includes:  38 g for men age 47 or younger.  30 g for men over age 73.  63 g for women age 58 or younger.  21 g for women over age 48. You can get the recommended daily intake of dietary fiber by:  Eating a variety of fruits, vegetables, grains, and beans.  Taking a fiber supplement, if it is not possible to get enough fiber through your diet. What do I need to know about a high-fiber diet?  It is better to get fiber through food sources rather than from fiber supplements. There is not a lot of research about how effective supplements are.  Always check the fiber content on the nutrition facts label of any prepackaged food. Look for foods that contain 5 g of fiber or more per serving.  Talk with a diet and nutrition specialist (dietitian) if you have questions about specific foods that are recommended or not recommended for your medical condition, especially if those foods are not listed below.  Gradually increase how much fiber you consume. If you increase your intake of dietary fiber too quickly, you may have bloating, cramping, or gas.  Drink plenty of water. Water helps you to digest fiber. What are tips for following this plan?  Eat a wide variety of high-fiber  foods.  Make sure that half of the grains that you eat each day are whole grains.  Eat breads and cereals that are made with whole-grain flour instead of refined flour or white flour.  Eat brown rice, bulgur wheat, or millet instead of white rice.  Start the day with a breakfast that is high in fiber, such as a cereal that contains 5 g of fiber or more per serving.  Use beans in place of meat in soups, salads, and pasta dishes.  Eat high-fiber snacks, such as berries, raw vegetables, nuts, and popcorn.  Choose whole fruits and vegetables instead of processed forms like juice or sauce. What foods can I eat?  Fruits Berries. Pears. Apples. Oranges. Avocado. Prunes and raisins. Dried figs. Vegetables Sweet potatoes. Spinach. Kale. Artichokes. Cabbage. Broccoli. Cauliflower. Green peas. Carrots. Squash. Grains Whole-grain breads. Multigrain cereal. Oats and oatmeal. Brown rice. Barley. Bulgur wheat. [place]. Quinoa. Bran muffins. Popcorn. Rye wafer crackers. Meats and other proteins Navy, kidney, and pinto beans. Soybeans. Split peas.  Lentils. Nuts and seeds. Dairy Fiber-fortified yogurt. Beverages Fiber-fortified soy milk. Fiber-fortified orange juice. Other foods Fiber bars. The items listed above may not be a complete list of recommended foods and beverages. Contact a dietitian for more options. What foods are not recommended? Fruits Fruit juice. Cooked, strained fruit. Vegetables Fried potatoes. Canned vegetables. Well-cooked vegetables. Grains White bread. Pasta made with refined flour. White rice. Meats and other proteins Fatty cuts of meat. Fried chicken or fried fish. Dairy Milk. Yogurt. Cream cheese. Sour cream. Fats and oils Butters. Beverages Soft drinks. Other foods Cakes and pastries. The items listed above may not be a complete list of foods and beverages to avoid. Contact a dietitian for more information. Summary  Fiber is a type of carbohydrate. It is  found in fruits, vegetables, whole grains, and beans.  There are many health benefits of eating a high-fiber diet, such as preventing constipation, lowering blood cholesterol, helping with weight loss, and reducing your risk of heart disease, diabetes, and certain cancers.  Gradually increase your intake of fiber. Increasing too fast can result in cramping, bloating, and gas. Drink plenty of water while you increase your fiber.  The best sources of fiber include whole fruits and vegetables, whole grains, nuts, seeds, and beans. This information is not intended to replace advice given to you by your health care provider. Make sure you discuss any questions you have with your health care provider. Document Revised: 05/05/2017 Document Reviewed: 05/05/2017 Elsevier Patient Education  2020 [employer].

## 2020-04-07 LAB — COMPLETE METABOLIC PANEL WITH GFR
AG Ratio: 2.2 (calc) (ref 1.0–2.5)
ALT: 19 U/L (ref 6–29)
AST: 17 U/L (ref 10–35)
Albumin: 4.4 g/dL (ref 3.6–5.1)
Alkaline phosphatase (APISO): 49 U/L (ref 37–153)
BUN: 22 mg/dL (ref 7–25)
CO2: 27 mmol/L (ref 20–32)
Calcium: 9.1 mg/dL (ref 8.6–10.4)
Chloride: 109 mmol/L (ref 98–110)
Creat: 0.8 mg/dL (ref 0.60–0.88)
GFR, Est African American: 80 mL/min/{1.73_m2} (ref >=60)
GFR, Est Non African American: 69 mL/min/{1.73_m2} (ref >=60)
Globulin: 2 g/dL (calc) (ref 1.9–3.7)
Glucose, Bld: 104 mg/dL — ABNORMAL HIGH (ref 65–99)
Potassium: 4.6 mmol/L (ref 3.5–5.3)
Sodium: 142 mmol/L (ref 135–146)
Total Bilirubin: 0.9 mg/dL (ref 0.2–1.2)
Total Protein: 6.4 g/dL (ref 6.1–8.1)

## 2020-04-07 LAB — TSH: TSH: 3.06 mIU/L (ref 0.40–4.50)

## 2020-04-07 LAB — LIPID PANEL
Cholesterol: 136 mg/dL (ref <200)
HDL: 39 mg/dL — ABNORMAL LOW (ref >=50)
LDL Cholesterol (Calc): 71 mg/dL (calc)
Non-HDL Cholesterol (Calc): 97 mg/dL (calc) (ref <130)
Total CHOL/HDL Ratio: 3.5 (calc) (ref <5.0)
Triglycerides: 182 mg/dL — ABNORMAL HIGH (ref <150)

## 2020-04-07 LAB — CBC WITH DIFFERENTIAL/PLATELET
Absolute Monocytes: 617 cells/uL (ref 200–950)
Basophils Absolute: 31 cells/uL (ref 0–200)
Basophils Relative: 0.6 %
Eosinophils Absolute: 199 cells/uL (ref 15–500)
Eosinophils Relative: 3.9 %
HCT: 41.9 % (ref 35.0–45.0)
Hemoglobin: 14 g/dL (ref 11.7–15.5)
Lymphs Abs: 1627 cells/uL (ref 850–3900)
MCH: 32.8 pg (ref 27.0–33.0)
MCHC: 33.4 g/dL (ref 32.0–36.0)
MCV: 98.1 fL (ref 80.0–100.0)
MPV: 10.7 fL (ref 7.5–12.5)
Monocytes Relative: 12.1 %
Neutro Abs: 2627 cells/uL (ref 1500–7800)
Neutrophils Relative %: 51.5 %
Platelets: 162 10*3/uL (ref 140–400)
RBC: 4.27 10*6/uL (ref 3.80–5.10)
RDW: 13 % (ref 11.0–15.0)
Total Lymphocyte: 31.9 %
WBC: 5.1 10*3/uL (ref 3.8–10.8)

## 2020-04-07 LAB — MAGNESIUM: Magnesium: 2.1 mg/dL (ref 1.5–2.5)

## 2020-04-07 LAB — HEMOGLOBIN A1C
Hgb A1c MFr Bld: 5.8 % of total Hgb — ABNORMAL HIGH (ref <5.7)
Mean Plasma Glucose: 120 (calc)
eAG (mmol/L): 6.6 (calc)

## 2020-05-03 ENCOUNTER — Other Ambulatory Visit: Payer: Self-pay | Admitting: *Deleted

## 2020-05-03 DIAGNOSIS — I10 Essential (primary) hypertension: Secondary | ICD-10-CM

## 2020-05-03 DIAGNOSIS — K219 Gastro-esophageal reflux disease without esophagitis: Secondary | ICD-10-CM

## 2020-05-03 MED ORDER — FAMOTIDINE 20 MG PO TABS: ORAL_TABLET | ORAL | 0 refills | Status: DC

## 2020-05-03 MED ORDER — ATENOLOL 100 MG PO TABS: ORAL_TABLET | ORAL | 0 refills | Status: DC

## 2020-06-16 ENCOUNTER — Other Ambulatory Visit: Payer: Self-pay | Admitting: Adult Health

## 2020-06-16 DIAGNOSIS — E782 Mixed hyperlipidemia: Secondary | ICD-10-CM

## 2020-06-16 MED ORDER — ATORVASTATIN CALCIUM 80 MG PO TABS: ORAL_TABLET | ORAL | 1 refills | Status: DC

## 2020-07-05 ENCOUNTER — Other Ambulatory Visit: Payer: Self-pay | Admitting: *Deleted

## 2020-07-05 DIAGNOSIS — I10 Essential (primary) hypertension: Secondary | ICD-10-CM

## 2020-07-05 DIAGNOSIS — K219 Gastro-esophageal reflux disease without esophagitis: Secondary | ICD-10-CM

## 2020-07-05 MED ORDER — FAMOTIDINE 20 MG PO TABS: ORAL_TABLET | ORAL | 1 refills | Status: DC

## 2020-07-05 MED ORDER — ATENOLOL 100 MG PO TABS: ORAL_TABLET | ORAL | 1 refills | Status: DC

## 2020-07-11 ENCOUNTER — Ambulatory Visit (INDEPENDENT_AMBULATORY_CARE_PROVIDER_SITE_OTHER): Payer: PPO | Admitting: Internal Medicine

## 2020-07-11 ENCOUNTER — Other Ambulatory Visit: Payer: Self-pay

## 2020-07-11 ENCOUNTER — Encounter: Payer: Self-pay | Admitting: Internal Medicine

## 2020-07-11 VITALS — BP 172/70 | HR 60 | Temp 97.1°F | Resp 16 | Ht 63.0 in | Wt 152.6 lb

## 2020-07-11 DIAGNOSIS — E1169 Type 2 diabetes mellitus with other specified complication: Secondary | ICD-10-CM | POA: Diagnosis not present

## 2020-07-11 DIAGNOSIS — E785 Hyperlipidemia, unspecified: Secondary | ICD-10-CM

## 2020-07-11 DIAGNOSIS — Z79899 Other long term (current) drug therapy: Secondary | ICD-10-CM | POA: Diagnosis not present

## 2020-07-11 DIAGNOSIS — K219 Gastro-esophageal reflux disease without esophagitis: Secondary | ICD-10-CM

## 2020-07-11 DIAGNOSIS — E559 Vitamin D deficiency, unspecified: Secondary | ICD-10-CM | POA: Diagnosis not present

## 2020-07-11 DIAGNOSIS — N182 Chronic kidney disease, stage 2 (mild): Secondary | ICD-10-CM | POA: Diagnosis not present

## 2020-07-11 DIAGNOSIS — I10 Essential (primary) hypertension: Secondary | ICD-10-CM

## 2020-07-11 DIAGNOSIS — E1122 Type 2 diabetes mellitus with diabetic chronic kidney disease: Secondary | ICD-10-CM | POA: Diagnosis not present

## 2020-07-11 DIAGNOSIS — I700 Atherosclerosis of aorta: Secondary | ICD-10-CM

## 2020-07-11 MED ORDER — OLMESARTAN MEDOXOMIL 40 MG PO TABS: ORAL_TABLET | ORAL | 3 refills | Status: DC

## 2020-07-11 NOTE — Progress Notes (Signed)
History of Present Illness:       This very nice 84 y.o.  MWF presents for 3 month follow up with HTN, HLD, Pre-Diabetes and Vitamin D Deficiency. Patient has Aortic Atherosclerosis by CXR on 05/12/2017.  Patient has GERD controlled on her meds.      Patient is treated for HTN  (1992)  & BP has been controlled at home. Today's BP is elevated at 172/70. Patient has had no complaints of any cardiac type chest pain, palpitations, dyspnea / orthopnea / PND, dizziness, claudication, or dependent edema.      Hyperlipidemia is controlled with diet/Atorvastatin Patient denies myalgias or other med SE's. Last Lipids were at goal with slightly elevated Trig's:  Lab Results  Component Value Date   CHOL 136 04/06/2020   HDL 39 (L) 04/06/2020   LDLCALC 71 04/06/2020   TRIG 182 (H) 04/06/2020   CHOLHDL 3.5 04/06/2020    Also, the patient has T2_NIDDM w/CKD2 (A1c 6.5% /2009 & 2012 and 6.4% /2016). In 2020, she lost 30# by intentional dieting & was able to taper off of her Metformin. She has had no symptoms of reactive hypoglycemia, diabetic polys, paresthesias or visual blurring.  Last A1c was almost to goal:  Lab Results  Component Value Date   HGBA1C 5.8 (H) 04/06/2020       Further, the patient also has history of Vitamin D Deficiency ("39"/2018)and supplements vitamin D without any suspected side-effects. Last vitamin D was at goal:   Lab Results  Component Value Date   VD25OH 66 12/31/2019    Current Outpatient Medications on File Prior to Visit  Medication Sig  . aspirin 81 MG tablet Take 81 mg by mouth daily.  Marland Kitchen atenolol (TENORMIN) 100 MG tablet Take 1 tablet Daily for BP  . atorvastatin (LIPITOR) 80 MG tablet Take 1/2  tablet daily as directed for cholesterol  . Cholecalciferol (VITAMIN D PO) Take 5,000 Units by mouth 2 (two) times daily.   . famotidine (PEPCID) 20 MG tablet Take 1 tablet 2 x /day with Meals to Prevent Heartburn & Indigestion  . gabapentin (NEURONTIN) 100  MG capsule Take 1 capsule 3 x /day as needed for Nerve Pain  . lisinopril (ZESTRIL) 20 MG tablet Take 1 tablet Daily for BP  . meloxicam (MOBIC) 15 MG tablet Take 1/2 to 1 tablet daily with food for Pain / Inflammation & Limit to 5 days/week to prevent Kidney Damage  . Multiple Vitamin (MULTIVITAMIN) tablet Take 1 tablet by mouth daily.  . Multiple Vitamins-Minerals (HAIR SKIN AND NAILS FORMULA PO) Take by mouth daily.  . verapamil (CALAN) 120 MG tablet Take 1 tablet 2 x /day for BP  . vitamin B-12 (CYANOCOBALAMIN) 1000 MCG tablet Take 1,000 mcg by mouth daily.    Allergies  Allergen Reactions  . Diltiazem     PMHx:   Past Medical History:  Diagnosis Date  . DDD (degenerative disc disease), lumbar   . Fatty liver disease, nonalcoholic    CT AB 0000000  . Hyperlipidemia   . Hypertension   . Type II or unspecified type diabetes mellitus without mention of complication, not stated as uncontrolled   . Vitamin D deficiency     Immunization History  Administered Date(s) Administered  . H1N1 06/28/2008  . Influenza Whole 05/04/2013  . Influenza, High Dose Seasonal PF 04/26/2014, 04/26/2015, 04/01/2016, 05/07/2017, 04/07/2019, 04/26/2020  . Influenza-Unspecified 04/14/2018  . PFIZER SARS-COV-2 Vaccination 07/28/2019, 08/28/2019  . Pneumococcal Conjugate-13 04/26/2014  .  Pneumococcal Polysaccharide-23 07/10/2011  . Td 07/10/2011  . Zoster 06/28/2008    Past Surgical History:  Procedure Laterality Date  . APPENDECTOMY    . CATARACT EXTRACTION, BILATERAL Bilateral 09/2017   Dr. Gershon Crane  . KNEE ARTHROSCOPY Left   . MASTECTOMY Bilateral    30 years ago    FHx:    Reviewed / unchanged  SHx:    Reviewed / unchanged   Systems Review:  Constitutional: Denies fever, chills, wt changes, headaches, insomnia, fatigue, night sweats, change in appetite. Eyes: Denies redness, blurred vision, diplopia, discharge, itchy, watery eyes.  ENT: Denies discharge, congestion, post nasal drip,  epistaxis, sore throat, earache, hearing loss, dental pain, tinnitus, vertigo, sinus pain, snoring.  CV: Denies chest pain, palpitations, irregular heartbeat, syncope, dyspnea, diaphoresis, orthopnea, PND, claudication or edema. Respiratory: denies cough, dyspnea, DOE, pleurisy, hoarseness, laryngitis, wheezing.  Gastrointestinal: Denies dysphagia, odynophagia, heartburn, reflux, water brash, abdominal pain or cramps, nausea, vomiting, bloating, diarrhea, constipation, hematemesis, melena, hematochezia  or hemorrhoids. Genitourinary: Denies dysuria, frequency, urgency, nocturia, hesitancy, discharge, hematuria or flank pain. Musculoskeletal: Denies arthralgias, myalgias, stiffness, jt. swelling, pain, limping or strain/sprain.  Skin: Denies pruritus, rash, hives, warts, acne, eczema or change in skin lesion(s). Neuro: No weakness, tremor, incoordination, spasms, paresthesia or pain. Psychiatric: Denies confusion, memory loss or sensory loss. Endo: Denies change in weight, skin or hair change.  Heme/Lymph: No excessive bleeding, bruising or enlarged lymph nodes.  Physical Exam  BP (!) 172/70   Pulse 60   Temp (!) 97.1 F (36.2 C)   Resp 16   Ht 5\' 3"  (1.6 m)   Wt 152 lb 9.6 oz (69.2 kg)   SpO2 98%   BMI 27.03 kg/m   Appears  well nourished, well groomed  and in no distress.  Eyes: PERRLA, EOMs, conjunctiva no swelling or erythema. Sinuses: No frontal/maxillary tenderness ENT/Mouth: EAC's clear, TM's nl w/o erythema, bulging. Nares clear w/o erythema, swelling, exudates. Oropharynx clear without erythema or exudates. Oral hygiene is good. Tongue normal, non obstructing. Hearing intact.  Neck: Supple. Thyroid not palpable. Car 2+/2+ without bruits, nodes or JVD. Chest: Respirations nl with BS clear & equal w/o rales, rhonchi, wheezing or stridor.  Cor: Heart sounds normal w/ regular rate and rhythm without sig. murmurs, gallops, clicks or rubs. Peripheral pulses normal and equal  without  edema.  Abdomen: Soft & bowel sounds normal. Non-tender w/o guarding, rebound, hernias, masses or organomegaly.  Lymphatics: Unremarkable.  Musculoskeletal: Full ROM all peripheral extremities, joint stability, 5/5 strength and normal gait.  Skin: Warm, dry without exposed rashes, lesions or ecchymosis apparent.  Neuro: Cranial nerves intact, reflexes equal bilaterally. Sensory-motor testing grossly intact. Tendon reflexes grossly intact.  Pysch: Alert & oriented x 3.  Insight and judgement nl & appropriate. No ideations.  Assessment and Plan:  1. Essential hypertension  - Continue medication, monitor blood pressure at home.  - Continue DASH diet.  Reminder to go to the ER if any CP,  SOB, nausea, dizziness, severe HA, changes vision/speech.  - CBC with Differential/Platelet - COMPLETE METABOLIC PANEL WITH GFR - Magnesium - TSH  2. Hyperlipidemia associated with type 2 diabetes mellitus ([place])  - Continue diet/meds, exercise,& lifestyle modifications.  - Continue monitor periodic cholesterol/liver & renal functions   - Lipid panel - TSH  3. Type 2 diabetes mellitus with stage 2 chronic kidney disease, without long-term current use of insulin (H[facility])  - Continue diet, exercise  - Lifestyle modifications.  - Monitor appropriate labs.  - Hemoglobin  A1c - Insulin, random  4. Vitamin D deficiency  - Continue supplementation.  - VITAMIN D 25 Hydroxy   5. Gastroesophageal reflux disease   - CBC with Differential/Platelet  6. Aortic atherosclerosis ([facility]) by CXR 05/12/2017  - Lipid panel  7. Medication management  - CBC with Differential/Platelet - COMPLETE METABOLIC PANEL WITH GFR - Magnesium - Lipid panel - TSH - Hemoglobin A1c - Insulin, random - VITAMIN D 25 Hydroxy        Discussed  regular exercise, BP monitoring, weight control to achieve/maintain BMI less than 25 and discussed med and SE's. Recommended labs to assess and monitor clinical status with further  disposition pending results of labs.  I discussed the assessment and treatment plan with the patient. The patient was provided an opportunity to ask questions and all were answered. The patient agreed with the plan and demonstrated an understanding of the instructions.  I provided over 30 minutes of exam, counseling, chart review and  complex critical decision making.   Marinus Maw, MD

## 2020-07-11 NOTE — Patient Instructions (Signed)

## 2020-07-12 LAB — CBC WITH DIFFERENTIAL/PLATELET
Absolute Monocytes: 578 cells/uL (ref 200–950)
Basophils Absolute: 20 cells/uL (ref 0–200)
Basophils Relative: 0.4 %
Eosinophils Absolute: 201 cells/uL (ref 15–500)
Eosinophils Relative: 4.1 %
HCT: 41.6 % (ref 35.0–45.0)
Hemoglobin: 14.3 g/dL (ref 11.7–15.5)
Lymphs Abs: 1573 cells/uL (ref 850–3900)
MCH: 33.6 pg — ABNORMAL HIGH (ref 27.0–33.0)
MCHC: 34.4 g/dL (ref 32.0–36.0)
MCV: 97.7 fL (ref 80.0–100.0)
MPV: 10.8 fL (ref 7.5–12.5)
Monocytes Relative: 11.8 %
Neutro Abs: 2528 cells/uL (ref 1500–7800)
Neutrophils Relative %: 51.6 %
Platelets: 164 10*3/uL (ref 140–400)
RBC: 4.26 10*6/uL (ref 3.80–5.10)
RDW: 12.7 % (ref 11.0–15.0)
Total Lymphocyte: 32.1 %
WBC: 4.9 10*3/uL (ref 3.8–10.8)

## 2020-07-12 LAB — COMPLETE METABOLIC PANEL WITH GFR
AG Ratio: 2.3 (calc) (ref 1.0–2.5)
ALT: 19 U/L (ref 6–29)
AST: 19 U/L (ref 10–35)
Albumin: 4.5 g/dL (ref 3.6–5.1)
Alkaline phosphatase (APISO): 49 U/L (ref 37–153)
BUN/Creatinine Ratio: 31 (calc) — ABNORMAL HIGH (ref 6–22)
BUN: 18 mg/dL (ref 7–25)
CO2: 29 mmol/L (ref 20–32)
Calcium: 9.1 mg/dL (ref 8.6–10.4)
Chloride: 105 mmol/L (ref 98–110)
Creat: 0.59 mg/dL — ABNORMAL LOW (ref 0.60–0.88)
GFR, Est African American: 99 mL/min/{1.73_m2} (ref >=60)
GFR, Est Non African American: 85 mL/min/{1.73_m2} (ref >=60)
Globulin: 2 g/dL (calc) (ref 1.9–3.7)
Glucose, Bld: 115 mg/dL — ABNORMAL HIGH (ref 65–99)
Potassium: 4.3 mmol/L (ref 3.5–5.3)
Sodium: 141 mmol/L (ref 135–146)
Total Bilirubin: 1.1 mg/dL (ref 0.2–1.2)
Total Protein: 6.5 g/dL (ref 6.1–8.1)

## 2020-07-12 LAB — VITAMIN D 25 HYDROXY (VIT D DEFICIENCY, FRACTURES): Vit D, 25-Hydroxy: 56 ng/mL (ref 30–100)

## 2020-07-12 LAB — LIPID PANEL
Cholesterol: 165 mg/dL (ref <200)
HDL: 40 mg/dL — ABNORMAL LOW (ref >=50)
LDL Cholesterol (Calc): 95 mg/dL (calc)
Non-HDL Cholesterol (Calc): 125 mg/dL (calc) (ref <130)
Total CHOL/HDL Ratio: 4.1 (calc) (ref <5.0)
Triglycerides: 207 mg/dL — ABNORMAL HIGH (ref <150)

## 2020-07-12 LAB — HEMOGLOBIN A1C
Hgb A1c MFr Bld: 6.1 % of total Hgb — ABNORMAL HIGH (ref <5.7)
Mean Plasma Glucose: 128 mg/dL
eAG (mmol/L): 7.1 mmol/L

## 2020-07-12 LAB — TSH: TSH: 3.79 mIU/L (ref 0.40–4.50)

## 2020-07-12 LAB — MAGNESIUM: Magnesium: 2.1 mg/dL (ref 1.5–2.5)

## 2020-07-12 LAB — INSULIN, RANDOM: Insulin: 11.9 u[IU]/mL

## 2020-07-12 NOTE — Progress Notes (Signed)
============================================================ -   Test results slightly outside the reference range are not unusual. If there is anything important, I will review this with you,  otherwise it is considered normal test values.  If you have further questions,  please do not hesitate to contact me at the office or via My Chart.  ============================================================  - Total Chol = 165 anf LDL Chol = 95 - Both  Excellent   - Very low risk for Heart Attack  / Stroke ============================================================  - Triglycerides (   207   ) or fats in blood are too high  (goal is less than 150)    - Recommend avoid fried & greasy foods,  sweets / candy,   - Avoid white rice  (brown or wild rice or Quinoa is OK),   - Avoid white potatoes  (sweet potatoes are OK)   - Avoid anything made from white flour  - bagels, doughnuts, rolls, buns, biscuits, white and   wheat breads, pizza crust and traditional  pasta made of white flour & egg white  - (vegetarian pasta or spinach or wheat pasta is OK).    - Multi-grain bread is OK - like multi-grain flat bread or  sandwich thins.   - Avoid alcohol in excess.   - Exercise is also important. ============================================================  -  A1c (=12 week average Blood Sugar) is elevated worse at 6.1%  (was 5.7%) ============================================================  -  So Blood sugar and A1c are elevated in the borderline and  early or pre-diabetes range which has the same   300% increased risk for heart attack, stroke, cancer and   alzheimer- type vascular dementia as full blown diabetes.   But the good news is that diet, exercise with  weight loss can cure the early diabetes at this point. ============================================================  -  All Else - CBC - Kidneys - Electrolytes - Liver - Magnesium & Thyroid    - all  Normal /  OK ==============================================================

## 2020-08-09 ENCOUNTER — Other Ambulatory Visit: Payer: Self-pay

## 2020-08-09 DIAGNOSIS — I10 Essential (primary) hypertension: Secondary | ICD-10-CM

## 2020-08-09 MED ORDER — MELOXICAM 15 MG PO TABS: ORAL_TABLET | ORAL | 1 refills | Status: DC

## 2020-08-09 MED ORDER — VERAPAMIL HCL 120 MG PO TABS: ORAL_TABLET | ORAL | 3 refills | Status: DC

## 2020-08-09 MED ORDER — GABAPENTIN 100 MG PO CAPS: ORAL_CAPSULE | ORAL | 2 refills | Status: DC

## 2020-10-05 ENCOUNTER — Ambulatory Visit: Payer: PPO | Admitting: Adult Health Nurse Practitioner

## 2020-10-12 ENCOUNTER — Ambulatory Visit (INDEPENDENT_AMBULATORY_CARE_PROVIDER_SITE_OTHER): Payer: PPO | Admitting: Adult Health Nurse Practitioner

## 2020-10-12 ENCOUNTER — Encounter: Payer: Self-pay | Admitting: Adult Health Nurse Practitioner

## 2020-10-12 ENCOUNTER — Other Ambulatory Visit: Payer: Self-pay

## 2020-10-12 VITALS — BP 136/88 | HR 66 | Temp 97.0°F | Ht 63.0 in | Wt 152.6 lb

## 2020-10-12 DIAGNOSIS — R6889 Other general symptoms and signs: Secondary | ICD-10-CM | POA: Diagnosis not present

## 2020-10-12 DIAGNOSIS — Z0001 Encounter for general adult medical examination with abnormal findings: Secondary | ICD-10-CM

## 2020-10-12 DIAGNOSIS — J449 Chronic obstructive pulmonary disease, unspecified: Secondary | ICD-10-CM

## 2020-10-12 DIAGNOSIS — Z79899 Other long term (current) drug therapy: Secondary | ICD-10-CM | POA: Diagnosis not present

## 2020-10-12 DIAGNOSIS — I700 Atherosclerosis of aorta: Secondary | ICD-10-CM

## 2020-10-12 DIAGNOSIS — E559 Vitamin D deficiency, unspecified: Secondary | ICD-10-CM | POA: Diagnosis not present

## 2020-10-12 DIAGNOSIS — M79642 Pain in left hand: Secondary | ICD-10-CM | POA: Diagnosis not present

## 2020-10-12 DIAGNOSIS — M79641 Pain in right hand: Secondary | ICD-10-CM | POA: Diagnosis not present

## 2020-10-12 DIAGNOSIS — E785 Hyperlipidemia, unspecified: Secondary | ICD-10-CM | POA: Diagnosis not present

## 2020-10-12 DIAGNOSIS — E1169 Type 2 diabetes mellitus with other specified complication: Secondary | ICD-10-CM

## 2020-10-12 DIAGNOSIS — E663 Overweight: Secondary | ICD-10-CM | POA: Diagnosis not present

## 2020-10-12 DIAGNOSIS — N182 Chronic kidney disease, stage 2 (mild): Secondary | ICD-10-CM | POA: Diagnosis not present

## 2020-10-12 DIAGNOSIS — Z0000 Encounter for general adult medical examination without abnormal findings: Secondary | ICD-10-CM

## 2020-10-12 DIAGNOSIS — E1122 Type 2 diabetes mellitus with diabetic chronic kidney disease: Secondary | ICD-10-CM

## 2020-10-12 DIAGNOSIS — I10 Essential (primary) hypertension: Secondary | ICD-10-CM

## 2020-10-12 DIAGNOSIS — K219 Gastro-esophageal reflux disease without esophagitis: Secondary | ICD-10-CM

## 2020-10-12 MED ORDER — OMEPRAZOLE 20 MG PO CPDR: DELAYED_RELEASE_CAPSULE | ORAL | 1 refills | Status: DC

## 2020-10-12 NOTE — Progress Notes (Signed)
MEDICARE ANNUAL WELLNESS VISIT AND 3  MONTH FOLLOW UP  Assessment:   Diagnoses and all orders for this visit:  Encounter for Medicare annual wellness exam Yearly  Essential hypertension Continue current medications: Atenolol 100mg  daily Monitor blood pressure at home; call if consistently over 130/80 Continue DASH diet.   Reminder to go to the ER if any CP, SOB, nausea, dizziness, severe HA, changes vision/speech, left arm numbness and tingling and jaw pain.  Hyperlipidemia associated with type 2 diabetes mellitus ([place]) Aortic atherosclerosis Continue medications: Atorvastatin 80mg , half tab daily Discussed dietary and exercise modifications Low fat diet   Type 2 diabetes mellitus with stage 2 chronic kidney disease, without long-term current use of insulin (HCC) Controlled with diet and exercise Discussed general issues about diabetes pathophysiology and management. Education: Reviewed 'ABCs' of diabetes management (respective goals in parentheses):  A1C (<7), blood pressure (<130/80), and cholesterol (LDL <70) Dietary recommendations Encouraged aerobic exercise.  Discussed foot care, check daily Yearly retinal exam Dental exam every 6 months Monitor blood glucose, discussed goal for patient   Vitamin D deficiency Continue supplementation Taking Vitamin D 5,000 IU, two tablets daily  Gastroesophageal reflux disease without esophagitis Doing well at this time change famotidine to omeprazole.  Discussed with patient symptoms not resolved on H2 Diet discussed Monitor for triggers Avoid food with high acid content Avoid excessive cafeine Increase water intake  CKD stage 2 due to type 2 diabetes mellitus (HCC) Increase fluids  Avoid NSAIDS Blood pressure control Monitor sugars  Will continue to monitor  Chronic obstructive pulmonary disease, unspecified COPD type ([place]) Doing well at this time, no medicaitons No triggers, well controlled symptoms, cont to  monitor  Overweight (BMI 25.0-29.9) Discussed dietary and exercise modifications  Medication management Continued  Bilateral hand pain OTC diclofenac gel May use Meloxicam 3-4times a week PRN    Further disposition pending results if labs check today. Discussed med's effects and SE's.   Over 30 minutes of face to face interview, exam, counseling, chart review, and critical decision making was performed.    Future Appointments  Date Time Provider [place]  01/12/2021  8:45 AM Liane Comber, NP [facility] None  05/04/2021 10:00 AM Unk Pinto, MD [facility] None  10/12/2021 10:30 AM Liane Comber, NP [facility] None     Plan:   During the course of the visit the patient was educated and counseled about appropriate screening and preventive services including:    Pneumococcal vaccine   Prevnar 13  Influenza vaccine  Td vaccine  Screening electrocardiogram  Bone densitometry screening  Colorectal cancer screening  Diabetes screening  Glaucoma screening  Nutrition counseling   Advanced directives: requested   Subjective:  Debra Collins is a 85 y.o. female who presents for Medicare Annual Wellness Visit and 3 month follow up for HTN, HLD, DMII diet controlled with CKD, GERD, Vitamin D Deficiency and weight.    She is using diclofenac gel for her hands. She also has meloxicam. She reports overall she is doing well, no health or medicaton concerns today.  She reports some increase in frustration with her husband.  She reports he is having increased short term memory loss.  She also reports he has hearing aids but he does not wear them and she is constantly repeating herself. She reports she gets outside, weather permitting to walk and she works in her flower beds to help manage her stress.  She reports her two sons live in town as well.  Patient's GERD is not  controlled with her meds. She is taking famotidine 20mg  BID.  Reports she is having  breakthrough symptoms.  Reports she was previously on a  medication that helped but was changed.  Reports she tried to see if it would resolve symptoms but since changing her symptoms have returned.  Discussed risks vs benefit of this. Will send Rx for PPI.  BMI is Body mass index is 27.03 kg/m., she has not been working on diet and exercise. Wt Readings from Last 3 Encounters:  10/12/20 152 lb 9.6 oz (69.2 kg)  07/11/20 152 lb 9.6 oz (69.2 kg)  04/06/20 145 lb (65.8 kg)   Her blood pressure has been controlled at home, today their BP is BP: 136/88 Patient admits to not taking BP medication this AM.   She does not workout. She denies chest pain, shortness of breath, dizziness.   She is on cholesterol medication (atorvastatin 80 mg taking half pill and denies myalgias. Her cholesterol is not at goal. The cholesterol last visit was:    Lab Results  Component Value Date   CHOL 165 07/11/2020   HDL 40 (L) 07/11/2020   LDLCALC 95 07/11/2020   TRIG 207 (H) 07/11/2020   CHOLHDL 4.1 07/11/2020    She has been working on diet and exercise for diabetes with CKD (on metformin 2000 mg daily), she is on bASA, she is on ACE, and denies paresthesia of the feet, polydipsia and polyuria. Last A1C in the office was:  Lab Results  Component Value Date   HGBA1C 6.1 (H) 07/11/2020   Lab Results  Component Value Date   GFRNONAA 85 07/11/2020   Patient is on Vitamin D supplement - 5000 units daily, but forgets to take   Lab Results  Component Value Date   VD25OH 56 07/11/2020       Medication Review:  Current Outpatient Medications on File Prior to Visit  Medication Sig Dispense Refill  . aspirin 81 MG tablet Take 81 mg by mouth daily.    Marland Kitchen atenolol (TENORMIN) 100 MG tablet Take 1 tablet Daily for BP 90 tablet 1  . atorvastatin (LIPITOR) 80 MG tablet Take 1/2  tablet daily as directed for cholesterol 90 tablet 1  . Cholecalciferol (VITAMIN D PO) Take 5,000 Units by mouth 2 (two) times daily.      . famotidine (PEPCID) 20 MG tablet Take 1 tablet 2 x /day with Meals to Prevent Heartburn & Indigestion 180 tablet 1  . gabapentin (NEURONTIN) 100 MG capsule Take 1 capsule 3 x /day as needed for Nerve Pain 90 capsule 2  . meloxicam (MOBIC) 15 MG tablet Take 1/2 to 1 tablet daily with food for Pain / Inflammation & Limit to 5 days/week to prevent Kidney Damage 90 tablet 1  . Multiple Vitamin (MULTIVITAMIN) tablet Take 1 tablet by mouth daily.    . Multiple Vitamins-Minerals (HAIR SKIN AND NAILS FORMULA PO) Take by mouth daily.    Marland Kitchen olmesartan (BENICAR) 40 MG tablet Take      1 tablet      Daily       for BP 90 tablet 3  . verapamil (CALAN) 120 MG tablet Take 1 tablet 2 x /day for BP 180 tablet 3  . vitamin B-12 (CYANOCOBALAMIN) 1000 MCG tablet Take 1,000 mcg by mouth daily.     No current facility-administered medications on file prior to visit.    Current Problems (verified) Patient Active Problem List   Diagnosis Date Noted  . Aortic atherosclerosis ([place])  by CXR 05/12/2017 07/11/2020  . Hyperlipidemia associated with type 2 diabetes mellitus ([place]) 06/22/2019  . CKD stage 2 due to type 2 diabetes mellitus ([place]) 03/15/2019  . Unsteady gait 03/12/2018  . Gastroesophageal reflux disease without esophagitis 03/11/2018  . Overweight (BMI 25.0-29.9) 08/11/2017  . Chronic obstructive pulmonary disease ([place]) 01/29/2017  . Encounter for Medicare annual wellness exam 03/09/2015  . Diet-controlled diabetes mellitus ([place]) 08/16/2014  . Medication management 01/23/2014  . Essential hypertension 07/25/2013  . Vitamin D deficiency 07/25/2013    Screening Tests Immunization History  Administered Date(s) Administered  . H1N1 06/28/2008  . Influenza Whole 05/04/2013  . Influenza, High Dose Seasonal PF 04/26/2014, 04/26/2015, 04/01/2016, 05/07/2017, 04/07/2019, 04/26/2020  . Influenza-Unspecified 04/14/2018  . PFIZER(Purple Top)SARS-COV-2 Vaccination 07/28/2019, 08/28/2019  . Pneumococcal  Conjugate-13 04/26/2014  . Pneumococcal Polysaccharide-23 07/10/2011  . Td 07/10/2011  . Zoster 06/28/2008   Preventative care: Last colonoscopy: 2005 declines another Last mammogram: bilateral mastectomy DEXA: 01/2016 - normal US transvaginal 08/2016  Prior vaccinations: TD or Tdap: 2012  Influenza: 2019 Pneumococcal: 2012 Prevnar13: 2015 Shingles/Zostavax: 2009  Names of Other Physician/Practitioners you currently use: 1. [facility] here for primary care 2. Dr. Gershon Crane, eye doctor, last visit 09/2017 HAD CATARACT SURGERY AND STATES SIGHT GOT WORSE 3., Dr. Lavone Neri dentist, last visit 09/2017, goes q52m  Patient Care Team: Unk Pinto, MD as PCP - General (Internal Medicine) Melissa Noon, OD (Optometry)  Allergies Allergies  Allergen Reactions  . Diltiazem     SURGICAL HISTORY She  has a past surgical history that includes Knee arthroscopy (Left); Appendectomy; Mastectomy (Bilateral); and Cataract extraction, bilateral (Bilateral, 09/2017). FAMILY HISTORY Her family history includes Heart attack in her father and sister; Heart disease in her father and mother; Stroke in her mother. SOCIAL HISTORY She  reports that she quit smoking about 27 years ago. She has never used smokeless tobacco. She reports that she does not drink alcohol and does not use drugs.  MEDICARE WELLNESS OBJECTIVES: Physical activity: Current Exercise Habits: The patient does not participate in regular exercise at present Cardiac risk factors: Cardiac Risk Factors include: advanced age (>70men, >48 women);hypertension;dyslipidemia;diabetes mellitus;sedentary lifestyle Depression/mood screen:   Depression screen [facility] 2/9 10/12/2020  Decreased Interest 0  Down, Depressed, Hopeless 0  PHQ - 2 Score 0    ADLs:  In your present state of health, do you have any difficulty performing the following activities: 10/12/2020 07/11/2020  Hearing? N N  Vision? N N   Difficulty concentrating or making decisions? N N  Walking or climbing stairs? N N  Dressing or bathing? N N  Doing errands, shopping? N N  Preparing Food and eating ? N -  Using the Toilet? N -  In the past six months, have you accidently leaked urine? N -  Do you have problems with loss of bowel control? N -  Managing your Medications? N -  Managing your Finances? N -  Housekeeping or managing your Housekeeping? N -  Some recent data might be hidden     Cognitive Testing  Alert? Yes  Normal Appearance?Yes  Oriented to person? Yes  Place? Yes   Time? Yes  Recall of three objects?  Yes  Can perform simple calculations? Yes  Displays appropriate judgment?Yes  Can read the correct time from a watch face?Yes  EOL planning: Does Patient Have a Medical Advance Directive?: Yes Type of Advance Directive: [place] will Does patient want to make changes to medical advance  directive?: No - Patient declined Copy of [place] in Chart?: No - copy requested     Review of Systems  Constitutional: Negative for chills, fever, malaise/fatigue and weight loss.  HENT: Negative for congestion, ear pain, hearing loss, sore throat and tinnitus.   Eyes: Negative.  Negative for blurred vision and double vision.  Respiratory: Negative for cough, sputum production, shortness of breath and wheezing.   Cardiovascular: Negative for chest pain, palpitations, orthopnea, claudication, leg swelling and PND.  Gastrointestinal: Negative for abdominal pain, blood in stool, constipation, diarrhea, heartburn, melena, nausea and vomiting.  Genitourinary: Negative.   Musculoskeletal: Negative for falls, joint pain and myalgias.  Skin: Negative.  Negative for rash.  Neurological: Negative for dizziness, tingling, sensory change, loss of consciousness, weakness and headaches.  Endo/Heme/Allergies: Negative for polydipsia.  Psychiatric/Behavioral: Negative.  Negative  for depression, memory loss, substance abuse and suicidal ideas. The patient is not nervous/anxious and does not have insomnia.   All other systems reviewed and are negative.    Objective:     Today's Vitals   10/12/20 1032  BP: 136/88  Pulse: 66  Temp: (!) 97 F (36.1 C)  SpO2: 95%  Weight: 152 lb 9.6 oz (69.2 kg)  Height: 5\' 3"  (1.6 m)  PainSc: 1    Body mass index is 27.03 kg/m.  General appearance: alert, no distress, WD/WN, female HEENT: normocephalic, sclerae anicteric, TMs pearly, nares patent, no discharge or erythema, pharynx normal Oral cavity: MMM, no lesions Neck: supple, no lymphadenopathy, no thyromegaly, no masses Heart: RRR, normal S1, S2, no murmurs Lungs: CTA bilaterally, no wheezes, rhonchi, or rales Abdomen: +bs, soft, non tender, non distended, no masses, no hepatomegaly, no splenomegaly Musculoskeletal: nontender, no swelling, no obvious deformity   DIP, miidleright hand , left thumb side. Extremities: no edema, no cyanosis, no clubbing Pulses: 2+ symmetric, upper and lower extremities, normal cap refill Neurological: alert, oriented x 3, CN2-12 intact, strength normal upper extremities, LLE mildly weak, sensation normal throughout, DTRs 2+ throughout, no cerebellar signs, gait slow Psychiatric: normal affect, behavior normal, pleasant   Medicare Attestation I have personally reviewed: The patient's medical and social history Their use of alcohol, tobacco or illicit drugs Their current medications and supplements The patient's functional ability including ADLs,fall risks, home safety risks, cognitive, and hearing and visual impairment Diet and physical activities Evidence for depression or mood disorders  The patient's weight, height, BMI, and visual acuity have been recorded in the chart.  I have made referrals, counseling, and provided education to the patient based on review of the above and I have provided the patient with a written personalized  care plan for preventive services.     Garnet Sierras, NP   10/12/2020

## 2020-10-12 NOTE — Patient Instructions (Signed)
  Try increasing you Gabapentin at nighttime to 300mg .  This can make you sleepy.  You can take this during the day but know how it will effect you before driving.  Continue to diclofenac gel to your hands.  You can also take the meloxicam 3-4 times a week as needed to reduce inflammation.  Since the famotidine is not working, stop this.  Sent in prescription for omeprazole 20mg  daily.  All of your Health Maintenance items are up to date.    Debra Collins , Thank you for taking time to come for your Medicare Wellness Visit. I appreciate your ongoing commitment to your health goals. Please review the following plan we discussed and let me know if I can assist you in the future.   These are the goals we discussed: Goals    . Blood Pressure < 140/90    . LDL CALC < 70    . Weight (lb) < 160 lb (72.6 kg)       This is a list of the screening recommended for you and due dates:  Health Maintenance  Topic Date Due  . Eye exam for diabetics  02/18/2019  . COVID-19 Vaccine (3 - Booster for Pfizer series) 02/25/2020  . Complete foot exam   12/29/2020  . Hemoglobin A1C  01/09/2021  . Tetanus Vaccine  07/09/2021  . Flu Shot  Completed  . DEXA scan (bone density measurement)  Completed  . Pneumonia vaccines  Completed  . HPV Vaccine  Aged Out     We will respond with your lab results in 1-3 days.   GENERAL HEALTH GOALS  Know what a healthy weight is for you (roughly BMI <25) and aim to maintain this  Aim for 7+ servings of fruits and vegetables daily  70-80+ fluid ounces of water or unsweet tea for healthy kidneys  Limit to max 1 drink of alcohol per day; avoid smoking/tobacco  Limit animal fats in diet for cholesterol and heart health - choose grass fed whenever available  Avoid highly processed foods, and foods high in saturated/trans fats  Aim for low stress - take time to unwind and care for your mental health  Aim for 150 min of moderate intensity exercise weekly for heart  health, and weights twice weekly for bone health  Aim for 7-9 hours of sleep daily

## 2020-10-13 LAB — CBC WITH DIFFERENTIAL/PLATELET
Absolute Monocytes: 610 cells/uL (ref 200–950)
Basophils Absolute: 23 cells/uL (ref 0–200)
Basophils Relative: 0.4 %
Eosinophils Absolute: 148 cells/uL (ref 15–500)
Eosinophils Relative: 2.6 %
HCT: 45.8 % — ABNORMAL HIGH (ref 35.0–45.0)
Hemoglobin: 15.3 g/dL (ref 11.7–15.5)
Lymphs Abs: 1898 cells/uL (ref 850–3900)
MCH: 32.5 pg (ref 27.0–33.0)
MCHC: 33.4 g/dL (ref 32.0–36.0)
MCV: 97.2 fL (ref 80.0–100.0)
MPV: 11.2 fL (ref 7.5–12.5)
Monocytes Relative: 10.7 %
Neutro Abs: 3021 cells/uL (ref 1500–7800)
Neutrophils Relative %: 53 %
Platelets: 176 10*3/uL (ref 140–400)
RBC: 4.71 10*6/uL (ref 3.80–5.10)
RDW: 12.8 % (ref 11.0–15.0)
Total Lymphocyte: 33.3 %
WBC: 5.7 10*3/uL (ref 3.8–10.8)

## 2020-10-13 LAB — COMPLETE METABOLIC PANEL WITH GFR
AG Ratio: 2.2 (calc) (ref 1.0–2.5)
ALT: 22 U/L (ref 6–29)
AST: 22 U/L (ref 10–35)
Albumin: 4.8 g/dL (ref 3.6–5.1)
Alkaline phosphatase (APISO): 57 U/L (ref 37–153)
BUN: 23 mg/dL (ref 7–25)
CO2: 27 mmol/L (ref 20–32)
Calcium: 9.7 mg/dL (ref 8.6–10.4)
Chloride: 104 mmol/L (ref 98–110)
Creat: 0.66 mg/dL (ref 0.60–0.88)
GFR, Est African American: 95 mL/min/{1.73_m2} (ref >=60)
GFR, Est Non African American: 82 mL/min/{1.73_m2} (ref >=60)
Globulin: 2.2 g/dL (calc) (ref 1.9–3.7)
Glucose, Bld: 129 mg/dL — ABNORMAL HIGH (ref 65–99)
Potassium: 4 mmol/L (ref 3.5–5.3)
Sodium: 142 mmol/L (ref 135–146)
Total Bilirubin: 1 mg/dL (ref 0.2–1.2)
Total Protein: 7 g/dL (ref 6.1–8.1)

## 2020-10-13 LAB — HEMOGLOBIN A1C
Hgb A1c MFr Bld: 6.2 % of total Hgb — ABNORMAL HIGH (ref <5.7)
Mean Plasma Glucose: 131 mg/dL
eAG (mmol/L): 7.3 mmol/L

## 2020-10-13 LAB — SEDIMENTATION RATE: Sed Rate: 2 mm/h (ref 0–30)

## 2020-10-13 LAB — LIPID PANEL
Cholesterol: 176 mg/dL (ref <200)
HDL: 41 mg/dL — ABNORMAL LOW (ref >=50)
LDL Cholesterol (Calc): 94 mg/dL (calc)
Non-HDL Cholesterol (Calc): 135 mg/dL (calc) — ABNORMAL HIGH (ref <130)
Total CHOL/HDL Ratio: 4.3 (calc) (ref <5.0)
Triglycerides: 306 mg/dL — ABNORMAL HIGH (ref <150)

## 2020-10-13 LAB — C-REACTIVE PROTEIN: CRP: 1.3 mg/L (ref <8.0)

## 2020-10-13 LAB — RHEUMATOID FACTOR: Rheumatoid fact SerPl-aCnc: <14 IU/mL (ref <14)

## 2021-01-03 ENCOUNTER — Encounter: Payer: PPO | Admitting: Internal Medicine

## 2021-01-09 ENCOUNTER — Other Ambulatory Visit: Payer: Self-pay | Admitting: Internal Medicine

## 2021-01-09 DIAGNOSIS — I10 Essential (primary) hypertension: Secondary | ICD-10-CM

## 2021-01-09 MED ORDER — ATENOLOL 100 MG PO TABS: ORAL_TABLET | ORAL | 3 refills | Status: DC

## 2021-01-11 NOTE — Progress Notes (Signed)
3 MONTH FOLLOW UP  Assessment and Plan:   Hypertension Well controlled with current medications  Monitor blood pressure at home; patient to call if consistently greater than 130/80 Continue DASH diet.   Reminder to go to the ER if any CP, SOB, nausea, dizziness, severe HA, changes vision/speech, left arm numbness and tingling and jaw pain.  Cholesterol Currently at LDL goal; continue statin Continue low cholesterol diet and exercise.  Check lipid panel, CMP with GFR, CBC  Diabetes with diabetic chronic kidney disease Had normalized A1Cs following med assisted weight loss- Last A1c was slightly elevated, will see what result is today to see if she can continue without Metformin Continue diet and exercise.  Perform daily foot/skin check, notify office of any concerning changes.  Check A1C  Overweight Long discussion about weight loss, diet, and exercise Recommended diet heavy in fruits and veggies and low in animal meats, cheeses, and dairy products, appropriate calorie intake Discussed ideal weight for height - would like to maintain <140 lb, monitoring closely at home Patient will work on limiting snacks/sweets, increase intentional exercise Will follow up in 3 months  MEDICATION MANAGEMENT -magnesium   THYROID SCREENING -TSH   Vitamin D Def At goal at last visit; continue supplementation to maintain goal of 60-100 -Vit D level  GERD Symptoms well managed without breakthrough on omeprazole   Paresthesia of upper extremities  Did have cervical fusion 35 years ago  Positional without neck pain or daytime sx;  Discussed changing sleeping position, change pillow If persistent follow up and will plan to pursue cervical imaging- wants to wait for now She may take gabapentin if needed to help with sleep but advised this will only treat the sx  Continue diet and meds as discussed. Further disposition pending results of labs. Discussed med's effects and SE's.   Over 30  minutes of exam, counseling, chart review, and critical decision making was performed.   Future Appointments  Date Time Provider [place]  05/04/2021 10:00 AM Unk Pinto, MD [facility] None  10/12/2021 10:30 AM Liane Comber, NP [facility] None    ----------------------------------------------------------------------------------------------------------------------  HPI 85 y.o. female  presents for 3 month follow up on hypertension, cholesterol, T2 diabetes, weight and vitamin D deficiency.   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   She does report having intermittent tingling of bil hands up to forearms that will occasionally wake her up at night. None during the day, denies neck pain.   She has a diagnosis of GERD which is currently managed by Omeprazole 20 mg QD.   BMI is Body mass index is 27.92 kg/m., she has been working on diet and exercise.  Wt Readings from Last 3 Encounters:  01/12/21 157 lb 9.6 oz (71.5 kg)  10/12/20 152 lb 9.6 oz (69.2 kg)  07/11/20 152 lb 9.6 oz (69.2 kg)   Her blood pressure has been controlled at home, today their BP is BP: (!) 160/72 did not take her BP meds today  She does not workout. She denies chest pain, shortness of breath, dizziness.   She is on cholesterol medication (atorvastatin 40 mg daily) and denies myalgias. Her LDL cholesterol is not at goal. The cholesterol last visit was:   Lab Results  Component Value Date   CHOL 176 10/12/2020   HDL 41 (L) 10/12/2020   LDLCALC 94 10/12/2020   TRIG 306 (H) 10/12/2020  CHOLHDL 4.3 10/12/2020    She has been working on diet and exercise for well controlled T2DM (A1C 6.5% in 2009, was on metformin, tapered off following improved A1Cs with weight loss, currently managed by lfiestyle only), and denies increased appetite, nausea, paresthesia of the feet, polydipsia,  polyuria, visual disturbances and vomiting. Last A1C in the office was:  Lab Results  Component Value Date   HGBA1C 6.2 (H) 10/12/2020   She is in CKD II range, on ACEi:  Lab Results  Component Value Date   [facility] 82 10/12/2020   Patient is on Vitamin D supplement and at goal of 60 at recent check:    Lab Results  Component Value Date   VD25OH 56 07/11/2020       Current Medications:  Current Outpatient Medications on File Prior to Visit  Medication Sig   atenolol (TENORMIN) 100 MG tablet Take 1 tablet Daily for BP   atorvastatin (LIPITOR) 80 MG tablet Take 1/2  tablet daily as directed for cholesterol   Cholecalciferol (VITAMIN D PO) Take 5,000 Units by mouth 2 (two) times daily.    gabapentin (NEURONTIN) 100 MG capsule Take 1 capsule 3 x /day as needed for Nerve Pain   meloxicam (MOBIC) 15 MG tablet Take 1/2 to 1 tablet daily with food for Pain / Inflammation & Limit to 5 days/week to prevent Kidney Damage   Multiple Vitamin (MULTIVITAMIN) tablet Take 1 tablet by mouth daily.   Multiple Vitamins-Minerals (HAIR SKIN AND NAILS FORMULA PO) Take by mouth daily.   omeprazole (PRILOSEC) 20 MG capsule Take 1 capsule by mouth  every day for GERD   verapamil (CALAN) 120 MG tablet Take 1 tablet 2 x /day for BP   vitamin B-12 (CYANOCOBALAMIN) 1000 MCG tablet Take 1,000 mcg by mouth daily.   aspirin 81 MG tablet Take 81 mg by mouth daily. (Patient not taking: Reported on 01/12/2021)   famotidine (PEPCID) 20 MG tablet Take 1 tablet 2 x /day with Meals to Prevent Heartburn & Indigestion (Patient not taking: Reported on 01/12/2021)   olmesartan (BENICAR) 40 MG tablet Take      1 tablet      Daily       for BP (Patient not taking: Reported on 01/12/2021)   No current facility-administered medications on file prior to visit.     Allergies:  Allergies  Allergen Reactions   Diltiazem      Medical History:  Past Medical History:  Diagnosis Date   DDD (degenerative disc disease), lumbar     Fatty liver disease, nonalcoholic    CT AB 0093   Hyperlipidemia    Hypertension    Type II or unspecified type diabetes mellitus without mention of complication, not stated as uncontrolled    Vitamin D deficiency    Family history- Reviewed and unchanged Social history- Reviewed and unchanged   Review of Systems:  Review of Systems  Constitutional:  Negative for malaise/fatigue and weight loss.  HENT:  Negative for hearing loss and tinnitus.   Eyes:  Negative for blurred vision and double vision.  Respiratory:  Negative for cough, shortness of breath and wheezing.   Cardiovascular:  Negative for chest pain, palpitations, orthopnea, claudication and leg swelling.  Gastrointestinal:  Negative for abdominal pain, blood in stool, constipation, diarrhea, heartburn, melena, nausea and vomiting.  Genitourinary: Negative.   Musculoskeletal:  Negative for joint pain and myalgias.  Skin:  Negative for rash.  Neurological:  Positive for dizziness (when changing position quickly) and  tingling (bil upper extremities only at night after lying flat for several hours). Negative for sensory change, focal weakness, weakness and headaches.  Endo/Heme/Allergies:  Negative for polydipsia.  Psychiatric/Behavioral: Negative.    All other systems reviewed and are negative.  Physical Exam: BP (!) 160/72 (BP Location: Right Arm, Patient Position: Sitting, Cuff Size: Normal)   Pulse (!) 58   Temp 97.7 F (36.5 C)   Resp 16   Ht 5\' 3"  (1.6 m)   Wt 157 lb 9.6 oz (71.5 kg)   SpO2 98%   BMI 27.92 kg/m  Wt Readings from Last 3 Encounters:  01/12/21 157 lb 9.6 oz (71.5 kg)  10/12/20 152 lb 9.6 oz (69.2 kg)  07/11/20 152 lb 9.6 oz (69.2 kg)   General Appearance: Well nourished, in no apparent distress. Eyes: PERRLA, EOMs, conjunctiva no swelling or erythema Sinuses: No Frontal/maxillary tenderness ENT/Mouth: Ext aud canals clear, TMs without erythema, bulging. No erythema, swelling, or exudate on post  pharynx.  Tonsils not swollen or erythematous. Hearing normal.  Neck: Supple, thyroid normal.  Respiratory: Respiratory effort normal, BS equal bilaterally without rales, rhonchi, wheezing or stridor.  Cardio: RRR with no MRGs. Brisk peripheral pulses without edema.  Abdomen: Soft, + BS.  Non tender, no guarding, rebound, hernias, masses. Lymphatics: Non tender without lymphadenopathy.  Musculoskeletal: Full ROM, 5/5 strength, Normal gait. Neg spurling's Skin: Warm, dry without rashes, lesions, ecchymosis.  Neuro: Cranial nerves intact. No cerebellar symptoms.  Psych: Awake and oriented X 3, normal affect, Insight and Judgment appropriate.    Magda Bernheim, NP 9:52 AM [facility]

## 2021-01-12 ENCOUNTER — Ambulatory Visit (INDEPENDENT_AMBULATORY_CARE_PROVIDER_SITE_OTHER): Payer: PPO | Admitting: Nurse Practitioner

## 2021-01-12 ENCOUNTER — Other Ambulatory Visit: Payer: Self-pay

## 2021-01-12 ENCOUNTER — Ambulatory Visit: Payer: PPO | Admitting: Adult Health

## 2021-01-12 ENCOUNTER — Encounter: Payer: Self-pay | Admitting: Nurse Practitioner

## 2021-01-12 VITALS — BP 160/72 | HR 58 | Temp 97.7°F | Resp 16 | Ht 63.0 in | Wt 157.6 lb

## 2021-01-12 DIAGNOSIS — I10 Essential (primary) hypertension: Secondary | ICD-10-CM

## 2021-01-12 DIAGNOSIS — E785 Hyperlipidemia, unspecified: Secondary | ICD-10-CM

## 2021-01-12 DIAGNOSIS — E119 Type 2 diabetes mellitus without complications: Secondary | ICD-10-CM | POA: Diagnosis not present

## 2021-01-12 DIAGNOSIS — E559 Vitamin D deficiency, unspecified: Secondary | ICD-10-CM

## 2021-01-12 DIAGNOSIS — K219 Gastro-esophageal reflux disease without esophagitis: Secondary | ICD-10-CM

## 2021-01-12 DIAGNOSIS — N182 Chronic kidney disease, stage 2 (mild): Secondary | ICD-10-CM | POA: Diagnosis not present

## 2021-01-12 DIAGNOSIS — Z1329 Encounter for screening for other suspected endocrine disorder: Secondary | ICD-10-CM

## 2021-01-12 DIAGNOSIS — E663 Overweight: Secondary | ICD-10-CM

## 2021-01-12 DIAGNOSIS — E1169 Type 2 diabetes mellitus with other specified complication: Secondary | ICD-10-CM

## 2021-01-12 DIAGNOSIS — E1122 Type 2 diabetes mellitus with diabetic chronic kidney disease: Secondary | ICD-10-CM | POA: Diagnosis not present

## 2021-01-12 DIAGNOSIS — R202 Paresthesia of skin: Secondary | ICD-10-CM

## 2021-01-12 DIAGNOSIS — E782 Mixed hyperlipidemia: Secondary | ICD-10-CM | POA: Diagnosis not present

## 2021-01-12 DIAGNOSIS — Z79899 Other long term (current) drug therapy: Secondary | ICD-10-CM | POA: Diagnosis not present

## 2021-01-12 DIAGNOSIS — M79602 Pain in left arm: Secondary | ICD-10-CM

## 2021-01-12 DIAGNOSIS — M79601 Pain in right arm: Secondary | ICD-10-CM

## 2021-01-12 MED ORDER — ATORVASTATIN CALCIUM 80 MG PO TABS: ORAL_TABLET | ORAL | 1 refills | Status: DC

## 2021-01-12 MED ORDER — ATENOLOL 100 MG PO TABS: ORAL_TABLET | ORAL | 3 refills | Status: DC

## 2021-01-12 MED ORDER — OMEPRAZOLE 20 MG PO CPDR
DELAYED_RELEASE_CAPSULE | ORAL | 1 refills | Status: DC
Start: 2021-01-12 — End: 2022-01-27

## 2021-01-12 MED ORDER — GABAPENTIN 100 MG PO CAPS: ORAL_CAPSULE | ORAL | 2 refills | Status: DC

## 2021-01-12 NOTE — Patient Instructions (Signed)
Orthostatic Hypotension Blood pressure is a measurement of how strongly, or weakly, your blood is pressing against the walls of your arteries. Orthostatic hypotension is a sudden drop in blood pressure that happens when you quickly change positions,such as when you get up from sitting or lying down. Arteries are blood vessels that carry blood from your heart throughout your body. When blood pressure is too low, you may not get enough blood to your brain or to the rest of your organs. This can cause weakness, light-headedness, rapid heartbeat, and fainting. This can last for just a few seconds or for up to a few minutes. Orthostatic hypotension is usually not a serious problem. However, if it happens frequently or gets worse, it may be a sign of somethingmore serious. What are the causes? This condition may be caused by: Sudden changes in posture, such as standing up quickly after you have been sitting or lying down. Blood loss. Loss of body fluids (dehydration). Heart problems. Hormone (endocrine) problems. Pregnancy. Severe infection. Lack of certain nutrients. Severe allergic reactions (anaphylaxis). Certain medicines, such as blood pressure medicine or medicines that make the body lose excess fluids (diuretics). Sometimes, this condition can be caused by not taking medicine as directed, such as taking too much of a certain medicine. What increases the risk? The following factors may make you more likely to develop this condition: Age. Risk increases as you get older. Conditions that affect the heart or the central nervous system. Taking certain medicines, such as blood pressure medicine or diuretics. Being pregnant. What are the signs or symptoms? Symptoms of this condition may include: Weakness. Light-headedness. Dizziness. Blurred vision. Fatigue. Rapid heartbeat. Fainting, in severe cases. How is this diagnosed? This condition is diagnosed based on: Your medical history. Your  symptoms. Your blood pressure measurement. Your health care provider will check your blood pressure when you are: Lying down. Sitting. Standing. A blood pressure reading is recorded as two numbers, such as "120 over 80" (or 120/80). The first ("top") number is called the systolic pressure. It is a measure of the pressure in your arteries as your heart beats. The second ("bottom") number is called the diastolic pressure. It is a measure of the pressure in your arteries when your heart relaxes between beats. Blood pressure is measured in a unit called mm Hg. Healthy blood pressure for most adults is 120/80. If your blood pressure is below 90/60, you may be diagnosed withhypotension. Other information or tests that may be used to diagnose orthostatic hypotension include: Your other vital signs, such as your heart rate and temperature. Blood tests. Tilt table test. For this test, you will be safely secured to a table that moves you from a lying position to an upright position. Your heart rhythm and blood pressure will be monitored during the test. How is this treated? This condition may be treated by: Changing your diet. This may involve eating more salt (sodium) or drinking more water. Taking medicines to raise your blood pressure. Changing the dosage of certain medicines you are taking that might be lowering your blood pressure. Wearing compression stockings. These stockings help to prevent blood clots and reduce swelling in your legs. In some cases, you may need to go to the hospital for: Fluid replacement. This means you will receive fluids through an IV. Blood replacement. This means you will receive donated blood through an IV (transfusion). Treating an infection or heart problems, if this applies. Monitoring. You may need to be monitored while medicines that you   are taking wear off. Follow these instructions at home: Eating and drinking  Drink enough fluid to keep your urine pale  yellow. Eat a healthy diet, and follow instructions from your health care provider about eating or drinking restrictions. A healthy diet includes: Fresh fruits and vegetables. Whole grains. Lean meats. Low-fat dairy products. Eat extra salt only as directed. Do not add extra salt to your diet unless your health care provider told you to do that. Eat frequent, small meals. Avoid standing up suddenly after eating.  Medicines Take over-the-counter and prescription medicines only as told by your health care provider. Follow instructions from your health care provider about changing the dosage of your current medicines, if this applies. Do not stop or adjust any of your medicines on your own. General instructions  Wear compression stockings as told by your health care provider. Get up slowly from lying down or sitting positions. This gives your blood pressure a chance to adjust. Avoid hot showers and excessive heat as directed by your health care provider. Return to your normal activities as told by your health care provider. Ask your health care provider what activities are safe for you. Do not use any products that contain nicotine or tobacco, such as cigarettes, e-cigarettes, and chewing tobacco. If you need help quitting, ask your health care provider. Keep all follow-up visits as told by your health care provider. This is important.  Contact a health care provider if you: Vomit. Have diarrhea. Have a fever for more than 2-3 days. Feel more thirsty than usual. Feel weak and tired. Get help right away if you: Have chest pain. Have a fast or irregular heartbeat. Develop numbness in any part of your body. Cannot move your arms or your legs. Have trouble speaking. Become sweaty or feel light-headed. Faint. Feel short of breath. Have trouble staying awake. Feel confused. Summary Orthostatic hypotension is a sudden drop in blood pressure that happens when you quickly change  positions. Orthostatic hypotension is usually not a serious problem. It is diagnosed by having your blood pressure taken lying down, sitting, and then standing. It may be treated by changing your diet or adjusting your medicines. This information is not intended to replace advice given to you by your health care provider. Make sure you discuss any questions you have with your healthcare provider. Document Revised: 12/25/2017 Document Reviewed: 12/25/2017 Elsevier Patient Education  2022 Elsevier Inc.  

## 2021-01-13 LAB — LIPID PANEL
Cholesterol: 144 mg/dL (ref <200)
HDL: 38 mg/dL — ABNORMAL LOW (ref >=50)
LDL Cholesterol (Calc): 80 mg/dL (calc)
Non-HDL Cholesterol (Calc): 106 mg/dL (calc) (ref <130)
Total CHOL/HDL Ratio: 3.8 (calc) (ref <5.0)
Triglycerides: 165 mg/dL — ABNORMAL HIGH (ref <150)

## 2021-01-13 LAB — CBC WITH DIFFERENTIAL/PLATELET
Absolute Monocytes: 509 cells/uL (ref 200–950)
Basophils Absolute: 19 cells/uL (ref 0–200)
Basophils Relative: 0.4 %
Eosinophils Absolute: 149 cells/uL (ref 15–500)
Eosinophils Relative: 3.1 %
HCT: 43.3 % (ref 35.0–45.0)
Hemoglobin: 14.6 g/dL (ref 11.7–15.5)
Lymphs Abs: 1416 cells/uL (ref 850–3900)
MCH: 33.1 pg — ABNORMAL HIGH (ref 27.0–33.0)
MCHC: 33.7 g/dL (ref 32.0–36.0)
MCV: 98.2 fL (ref 80.0–100.0)
MPV: 11.1 fL (ref 7.5–12.5)
Monocytes Relative: 10.6 %
Neutro Abs: 2707 cells/uL (ref 1500–7800)
Neutrophils Relative %: 56.4 %
Platelets: 155 10*3/uL (ref 140–400)
RBC: 4.41 10*6/uL (ref 3.80–5.10)
RDW: 12.4 % (ref 11.0–15.0)
Total Lymphocyte: 29.5 %
WBC: 4.8 10*3/uL (ref 3.8–10.8)

## 2021-01-13 LAB — COMPLETE METABOLIC PANEL WITH GFR
AG Ratio: 2.3 (calc) (ref 1.0–2.5)
ALT: 23 U/L (ref 6–29)
AST: 21 U/L (ref 10–35)
Albumin: 4.6 g/dL (ref 3.6–5.1)
Alkaline phosphatase (APISO): 51 U/L (ref 37–153)
BUN: 22 mg/dL (ref 7–25)
CO2: 28 mmol/L (ref 20–32)
Calcium: 9.1 mg/dL (ref 8.6–10.4)
Chloride: 106 mmol/L (ref 98–110)
Creat: 0.68 mg/dL (ref 0.60–0.88)
GFR, Est African American: 94 mL/min/{1.73_m2} (ref >=60)
GFR, Est Non African American: 81 mL/min/{1.73_m2} (ref >=60)
Globulin: 2 g/dL (calc) (ref 1.9–3.7)
Glucose, Bld: 119 mg/dL — ABNORMAL HIGH (ref 65–99)
Potassium: 4.5 mmol/L (ref 3.5–5.3)
Sodium: 141 mmol/L (ref 135–146)
Total Bilirubin: 0.8 mg/dL (ref 0.2–1.2)
Total Protein: 6.6 g/dL (ref 6.1–8.1)

## 2021-01-13 LAB — VITAMIN D 25 HYDROXY (VIT D DEFICIENCY, FRACTURES): Vit D, 25-Hydroxy: 67 ng/mL (ref 30–100)

## 2021-01-13 LAB — HEMOGLOBIN A1C
Hgb A1c MFr Bld: 6.4 % of total Hgb — ABNORMAL HIGH (ref <5.7)
Mean Plasma Glucose: 137 mg/dL
eAG (mmol/L): 7.6 mmol/L

## 2021-01-13 LAB — TSH: TSH: 3.25 mIU/L (ref 0.40–4.50)

## 2021-01-13 LAB — MAGNESIUM: Magnesium: 2.2 mg/dL (ref 1.5–2.5)

## 2021-01-30 DIAGNOSIS — M79601 Pain in right arm: Secondary | ICD-10-CM | POA: Insufficient documentation

## 2021-02-23 ENCOUNTER — Encounter: Payer: PPO | Admitting: Internal Medicine

## 2021-03-05 LAB — HM DIABETES EYE EXAM

## 2021-03-12 DIAGNOSIS — E119 Type 2 diabetes mellitus without complications: Secondary | ICD-10-CM | POA: Diagnosis not present

## 2021-03-12 DIAGNOSIS — H52203 Unspecified astigmatism, bilateral: Secondary | ICD-10-CM | POA: Diagnosis not present

## 2021-03-12 DIAGNOSIS — H5212 Myopia, left eye: Secondary | ICD-10-CM | POA: Diagnosis not present

## 2021-03-12 DIAGNOSIS — H524 Presbyopia: Secondary | ICD-10-CM | POA: Diagnosis not present

## 2021-03-12 DIAGNOSIS — Z7984 Long term (current) use of oral hypoglycemic drugs: Secondary | ICD-10-CM | POA: Diagnosis not present

## 2021-03-12 DIAGNOSIS — Z961 Presence of intraocular lens: Secondary | ICD-10-CM | POA: Diagnosis not present

## 2021-03-15 ENCOUNTER — Encounter: Payer: Self-pay | Admitting: Internal Medicine

## 2021-05-03 ENCOUNTER — Encounter: Payer: Self-pay | Admitting: Internal Medicine

## 2021-05-03 NOTE — Patient Instructions (Signed)

## 2021-05-03 NOTE — Progress Notes (Signed)
Annual Screening/Preventative Visit & Comprehensive Evaluation &  Examination  Future Appointments  Date Time Provider [place]  05/04/2021     -   CPE 10:00 AM Unk Pinto, MD [facility] None  10/12/2021      -   Wellness 10:30 AM Liane Comber, NP [facility] None  05/06/2022    -   CPE 10:00 AM Unk Pinto, MD [facility] None        This very nice 85 y.o. MWF presents for a Screening /Preventative Visit & comprehensive evaluation and management of multiple medical co-morbidities.  Patient has been followed for HTN, HLD, T2_NIDDM  and Vitamin D Deficiency. CXR in 2018 showed Aortic Atherosclerosis & Emphysema. Patient does have chronic painful paresthesias of both UE consequent of Cx DJD/DDD  for which she takes Gabapentin & wishes to increase her dose.         HTN predates since 5. Patient's BP has been controlled at home and patient denies any cardiac symptoms as chest pain, palpitations, shortness of breath, dizziness or ankle swelling. Today's BP is elevated at 180/80 & patient relates that she did not take her Atenolol & Verapamil this am !         Patient's hyperlipidemia is controlled with diet and Atorvastatin. Patient denies myalgias or other medication SE's. Last lipids were at goal except elevated Trig's:  Lab Results  Component Value Date   CHOL 144 01/12/2021   HDL 38 (L) 01/12/2021   LDLCALC 80 01/12/2021   TRIG 165 (H) 01/12/2021   CHOLHDL 3.8 01/12/2021         Patient has hx/o T2_NIDDM (2009)  w/CKD2 (A1c 6.5% /2009 & 2012 and 6.4% /2016). After a 20# weight loss in 2020, she was able to taper off of her Metformin.  Patient denies reactive hypoglycemic symptoms, visual blurring, diabetic polys or paresthesias. Last A1c was not at goal:  Lab Results  Component Value Date   HGBA1C 6.4 (H) 01/12/2021         Finally, patient has history of Vitamin D Deficiency ("39"/ 2018) and last Vitamin D was at goal:  Lab Results  Component Value  Date   VD25OH 67 01/12/2021     Current Outpatient Medications on File Prior to Visit  Medication Sig    (Patient not taking: Reported on 01/12/2021)   atenolol 100 MG tablet Take 1 tablet Daily for BP   atorvastatin  80 MG tablet Take 1/2  tablet daily as directed for cholesterol   VITAMIN D 5,000 Units  Take 2 (two) times daily.    gabapentin  100 MG capsule Take 1 capsule 3 x /day as needed for Nerve Pain   meloxicam  15 MG tablet Take 1/2 to 1 tablet daily with food    Multiple Vitamin tablet Take 1 tablet daily.   HAIR SKIN AND NAILS FORMULA  Take  daily.   omeprazole  20 MG  Take 1 capsule every day for GERD   verapamil (CALAN) 120 MG tablet Take 1 tablet 2 x /day for BP   vitamin B-12  1000 MCG tablet Take daily.  Allergies  Allergen Reactions   Diltiazem      Past Medical History:  Diagnosis Date   DDD (degenerative disc disease), lumbar    Fatty liver disease, nonalcoholic    CT AB 6440   Hyperlipidemia    Hypertension    Type II  diabetes mellitus     Vitamin D deficiency      Health Maintenance  Topic Date Due   Zoster Vaccines- Shingrix (1 of 2) Never done   COVID-19 Vaccine (4 - Booster for Pfizer series) 06/11/2020   FOOT EXAM  12/29/2020   URINE MICROALBUMIN  12/30/2020   INFLUENZA VACCINE  02/12/2021   TETANUS/TDAP  07/09/2021   HEMOGLOBIN A1C  07/15/2021   OPHTHALMOLOGY EXAM  03/05/2022   Pneumonia Vaccine 93+ Years old  Completed   DEXA SCAN  Completed     Immunization History  Administered Date(s) Administered   H1N1 06/28/2008   Influenza Whole 05/04/2013   Influenza, High Dos 05/07/2017, 04/07/2019, 04/26/2020   Influenza-Unspecified 04/14/2018   PFIZER SARS-COV-2 Vacc 07/28/2019, 08/28/2019, 04/16/2020   Pneumococcal -13 04/26/2014   Pneumococcal -23 07/10/2011   Td 07/10/2011   Zoster, Live 06/28/2008    Last Colon - 08/2003 - Dr Delfin Edis - Diverticulosis - Aged out   Last MGM -  2003 - Bilat sub-cutaneous ressections   Past Surgical History:  Procedure Laterality Date   APPENDECTOMY     CATARACT EXTRACTION, BILATERAL Bilateral 09/2017   Dr. Gershon Crane   KNEE ARTHROSCOPY Left    MASTECTOMY Bilateral    30 years ago     Family History  Problem Relation Age of Onset   Stroke Mother    Heart disease Mother    Heart disease Father    Heart attack Father    Heart attack Sister      Social History   Tobacco Use   Smoking status: Former    Types: Cigarettes    Quit date: 07/15/1993    Years since quitting: 27.8   Smokeless tobacco: Never  Substance Use Topics   Alcohol use: No   Drug use: No      ROS Constitutional: Denies fever, chills, weight loss/gain, headaches, insomnia,  night sweats, and change in appetite. Does c/o fatigue. Eyes: Denies redness, blurred vision, diplopia, discharge, itchy, watery eyes.  ENT: Denies discharge, congestion, post nasal drip, epistaxis, sore throat, earache, hearing loss, dental pain, Tinnitus, Vertigo, Sinus pain, snoring.  Cardio: Denies chest pain, palpitations, irregular heartbeat, syncope, dyspnea, diaphoresis, orthopnea, PND, claudication, edema Respiratory: denies cough, dyspnea, DOE, pleurisy, hoarseness, laryngitis, wheezing.  Gastrointestinal: Denies dysphagia, heartburn, reflux, water brash, pain, cramps, nausea, vomiting, bloating, diarrhea, constipation, hematemesis, melena, hematochezia, jaundice, hemorrhoids Genitourinary: Denies dysuria, frequency, urgency, nocturia, hesitancy, discharge, hematuria, flank pain Breast: Breast lumps, nipple discharge, bleeding.  Musculoskeletal: Denies arthralgia, myalgia, stiffness, Jt. Swelling, pain, limp, and strain/sprain. Denies falls. Skin: Denies puritis, rash, hives, warts, acne, eczema, changing in skin lesion Neuro: No weakness, tremor, incoordination, spasms, paresthesia, pain Psychiatric: Denies confusion,  memory loss, sensory loss. Denies Depression. Endocrine: Denies change in weight, skin, hair change, nocturia, and paresthesia, diabetic polys, visual blurring, hyper / hypo glycemic episodes.  Heme/Lymph: No excessive bleeding, bruising, enlarged lymph nodes.  Physical Exam  BP (!) 180/90   Pulse 64   Temp 97.8 F (36.6 C)   Resp 16   Ht 5\' 3"  (1.6 m)   Wt 159 lb 9.6 oz (72.4 kg)   SpO2 97%   BMI 28.27 kg/m  General Appearance: Over nourished, well groomed and in no apparent distress.  Eyes: PERRLA, EOMs, conjunctiva no swelling or erythema, normal fundi and vessels. Sinuses: No frontal/maxillary tenderness ENT/Mouth: EACs patent / TMs  nl. Nares clear without erythema, swelling, mucoid exudates. Oral hygiene is good. No erythema, swelling, or exudate. Tongue normal, non-obstructing. Tonsils not swollen or erythematous. Hearing normal.  Neck: Supple, thyroid not palpable. No bruits, nodes or JVD. Respiratory: Respiratory effort normal.  BS equal and clear bilateral without rales, rhonci, wheezing or stridor. Cardio: Heart sounds are normal with regular rate and rhythm and no murmurs, rubs or gallops. Peripheral pulses are normal and equal bilaterally without edema. No aortic or femoral bruits. Chest: symmetric with normal excursions and percussion. Breasts: Symmetric, without lumps, nipple discharge, retractions, or fibrocystic changes.  Abdomen: Flat, soft with bowel sounds active. Nontender, no guarding, rebound, hernias, masses, or organomegaly.  Lymphatics: Non tender without lymphadenopathy.  Genitourinary:  Musculoskeletal: Full ROM all peripheral extremities, joint stability, 5/5 strength, and normal gait. Skin: Warm and dry without rashes, lesions, cyanosis, clubbing or  ecchymosis.  Neuro: Cranial nerves intact, reflexes equal bilaterally. Normal muscle tone, no cerebellar symptoms. Sensation intact.  Pysch: Alert and oriented X 3, normal affect, Insight and Judgment  appropriate.    Assessment and Plan  1. Annual Preventative Screening Examination   2. Essential hypertension  - Patient is strongly encouraged to take her BP meds on schedule & Is asked to monitor her BP's 2 x /day - am& pm & call if remain elevated.   - EKG 12-Lead - Urinalysis, Routine w reflex microscopic - Microalbumin / creatinine urine ratio - CBC with Differential/Platelet - COMPLETE METABOLIC PANEL WITH GFR - Magnesium - TSH  3. Hyperlipidemia associated with type 2 diabetes mellitus ([place])  - EKG 12-Lead - Lipid panel - TSH  4. Type 2 diabetes mellitus with stage 2 chronic kidney  disease, without long-term current use of insulin (HCC)  - EKG 12-Lead - Urinalysis, Routine w reflex microscopic - Microalbumin / creatinine urine ratio - HM DIABETES FOOT EXAM - LOW EXTREMITY NEUR EXAM DOCUM - Hemoglobin A1c - Insulin, random  5. Vitamin D deficiency  - VITAMIN D 25 Hydroxy   6. Diet-controlled diabetes mellitus ([place])  - EKG 12-Lead  7. Aortic atherosclerosis ([place]) by CXR 05/12/2017  - EKG 12-Lead  8. Screening for colorectal cancer  - POC Hemoccult Bld/Stl  9. Screening for ischemic heart disease  - EKG 12-Lead  10. FHx: heart disease  - EKG 12-Lead  11. Former smoker  - EKG 12-Lead  12. Medication management  - Urinalysis, Routine w reflex microscopic - Microalbumin / creatinine urine ratio - CBC with Differential/Platelet - COMPLETE METABOLIC PANEL WITH GFR - Magnesium - Lipid panel - TSH - Hemoglobin A1c - Insulin, random - VITAMIN D 25 Hydroxy          Patient was counseled in prudent diet to achieve/maintain BMI less than 25 for weight control, BP monitoring, regular exercise and medications. Discussed med's effects and SE's. Screening labs and tests as requested with regular follow-up as recommended. Over 40 minutes of exam, counseling, chart review and high complex critical decision making was performed.   Kirtland Bouchard, MD

## 2021-05-04 ENCOUNTER — Other Ambulatory Visit: Payer: Self-pay

## 2021-05-04 ENCOUNTER — Encounter: Payer: Self-pay | Admitting: Internal Medicine

## 2021-05-04 ENCOUNTER — Ambulatory Visit (INDEPENDENT_AMBULATORY_CARE_PROVIDER_SITE_OTHER): Payer: PPO | Admitting: Internal Medicine

## 2021-05-04 VITALS — BP 180/90 | HR 64 | Temp 97.8°F | Resp 16 | Ht 63.0 in | Wt 159.6 lb

## 2021-05-04 DIAGNOSIS — E1169 Type 2 diabetes mellitus with other specified complication: Secondary | ICD-10-CM | POA: Diagnosis not present

## 2021-05-04 DIAGNOSIS — E119 Type 2 diabetes mellitus without complications: Secondary | ICD-10-CM

## 2021-05-04 DIAGNOSIS — E1122 Type 2 diabetes mellitus with diabetic chronic kidney disease: Secondary | ICD-10-CM

## 2021-05-04 DIAGNOSIS — Z87891 Personal history of nicotine dependence: Secondary | ICD-10-CM

## 2021-05-04 DIAGNOSIS — Z1211 Encounter for screening for malignant neoplasm of colon: Secondary | ICD-10-CM

## 2021-05-04 DIAGNOSIS — E785 Hyperlipidemia, unspecified: Secondary | ICD-10-CM | POA: Diagnosis not present

## 2021-05-04 DIAGNOSIS — Z8249 Family history of ischemic heart disease and other diseases of the circulatory system: Secondary | ICD-10-CM | POA: Diagnosis not present

## 2021-05-04 DIAGNOSIS — Z1212 Encounter for screening for malignant neoplasm of rectum: Secondary | ICD-10-CM

## 2021-05-04 DIAGNOSIS — Z0000 Encounter for general adult medical examination without abnormal findings: Secondary | ICD-10-CM | POA: Diagnosis not present

## 2021-05-04 DIAGNOSIS — Z136 Encounter for screening for cardiovascular disorders: Secondary | ICD-10-CM

## 2021-05-04 DIAGNOSIS — Z0001 Encounter for general adult medical examination with abnormal findings: Secondary | ICD-10-CM

## 2021-05-04 DIAGNOSIS — E559 Vitamin D deficiency, unspecified: Secondary | ICD-10-CM | POA: Diagnosis not present

## 2021-05-04 DIAGNOSIS — I10 Essential (primary) hypertension: Secondary | ICD-10-CM | POA: Diagnosis not present

## 2021-05-04 DIAGNOSIS — Z79899 Other long term (current) drug therapy: Secondary | ICD-10-CM | POA: Diagnosis not present

## 2021-05-04 DIAGNOSIS — I700 Atherosclerosis of aorta: Secondary | ICD-10-CM

## 2021-05-04 DIAGNOSIS — N182 Chronic kidney disease, stage 2 (mild): Secondary | ICD-10-CM | POA: Diagnosis not present

## 2021-05-04 MED ORDER — GABAPENTIN 600 MG PO TABS: ORAL_TABLET | ORAL | 1 refills | Status: DC

## 2021-05-05 ENCOUNTER — Other Ambulatory Visit: Payer: Self-pay | Admitting: Internal Medicine

## 2021-05-05 DIAGNOSIS — E039 Hypothyroidism, unspecified: Secondary | ICD-10-CM

## 2021-05-05 NOTE — Progress Notes (Signed)
============================================================ -   Test results slightly outside the reference range are not unusual. If there is anything important, I will review this with you,  otherwise it is considered normal test values.  If you have further questions,  please do not hesitate to contact me at the office or via My Chart.  ============================================================ ============================================================  -  Total Chol = 134     &   LDL Chol = 75     -     Both         Excellent   - Very low risk for Heart Attack  / Stroke ============================================================ ============================================================  -  Triglycerides (  165   ) or fats in blood are too high  (goal is less than 150)    - Recommend avoid fried & greasy foods,  sweets / candy,   - Avoid white rice  (brown or wild rice or Quinoa is OK),   - Avoid white potatoes  (sweet potatoes are OK)   - Avoid anything made from white flour  - bagels, doughnuts, rolls, buns, biscuits, white and   wheat breads, pizza crust and traditional  pasta made of white flour & egg white  - (vegetarian pasta or spinach or wheat pasta is OK).    - Multi-grain bread is OK - like multi-grain flat bread or  sandwich thins.   - Avoid alcohol in excess.   - Exercise is also important. ============================================================ ============================================================  -  TSH is slightly elevated  which implies Thyroid hormone is low in blood & may need to be on a thyroid pill.   The supplement Biotin which is many Skin, Hair & Nail supplements & vitamins  in extremely un-necessarily HIGH dosages will cause incorrect measurements of the TSH and unfortunately mis-diagnosis & incorrect treatment of Thyroid & therefore Dangerous. So please be sure that you are not taking any Biotin supplements & suggest you  call office to schedule a Nurse Visit in 1 month to recheck the TSH / Thyroid test  ============================================================ ============================================================  -  Your A1c = 6.3% - still not at goal ( less than 5.7% ) for your Diabetes .  - If you lose weight, you will cure your Diabetes and reduce your risk for Heart Attack, Stroke, Kidney Failure (Dialysis) and Cancer by 300%   - There is now available a once a week shot for Diabetes  ( & Wt Loss) That has helped many of our patients lose up to 50-60 pounds over 12-18 months)   - I recommend that your start this medication. If you are interested, please schedule an office visit with me, Caryl Pina or Hinton Dyer for initiation of therapy ============================================================ ============================================================  -  Vitamin D = 79    -   Excellent  ============================================================ ============================================================  -  All Else - CBC - Kidneys - Electrolytes - Liver &  Magnesium   - all  Normal / OK ============================================================ ============================================================

## 2021-05-07 LAB — CBC WITH DIFFERENTIAL/PLATELET
Absolute Monocytes: 678 cells/uL (ref 200–950)
Basophils Absolute: 42 cells/uL (ref 0–200)
Basophils Relative: 0.8 %
Eosinophils Absolute: 223 cells/uL (ref 15–500)
Eosinophils Relative: 4.2 %
HCT: 43.3 % (ref 35.0–45.0)
Hemoglobin: 14.4 g/dL (ref 11.7–15.5)
Lymphs Abs: 1569 cells/uL (ref 850–3900)
MCH: 32.7 pg (ref 27.0–33.0)
MCHC: 33.3 g/dL (ref 32.0–36.0)
MCV: 98.4 fL (ref 80.0–100.0)
MPV: 11 fL (ref 7.5–12.5)
Monocytes Relative: 12.8 %
Neutro Abs: 2788 cells/uL (ref 1500–7800)
Neutrophils Relative %: 52.6 %
Platelets: 151 10*3/uL (ref 140–400)
RBC: 4.4 10*6/uL (ref 3.80–5.10)
RDW: 12.9 % (ref 11.0–15.0)
Total Lymphocyte: 29.6 %
WBC: 5.3 10*3/uL (ref 3.8–10.8)

## 2021-05-07 LAB — URINALYSIS, ROUTINE W REFLEX MICROSCOPIC
Bilirubin Urine: NEGATIVE
Glucose, UA: NEGATIVE
Hgb urine dipstick: NEGATIVE
Ketones, ur: NEGATIVE
Leukocytes,Ua: NEGATIVE
Nitrite: NEGATIVE
Protein, ur: NEGATIVE
Specific Gravity, Urine: 1.011 (ref 1.001–1.035)
pH: 7 (ref 5.0–8.0)

## 2021-05-07 LAB — COMPLETE METABOLIC PANEL WITH GFR
AG Ratio: 2.1 (calc) (ref 1.0–2.5)
ALT: 27 U/L (ref 6–29)
AST: 26 U/L (ref 10–35)
Albumin: 4.5 g/dL (ref 3.6–5.1)
Alkaline phosphatase (APISO): 53 U/L (ref 37–153)
BUN/Creatinine Ratio: 37 (calc) — ABNORMAL HIGH (ref 6–22)
BUN: 26 mg/dL — ABNORMAL HIGH (ref 7–25)
CO2: 28 mmol/L (ref 20–32)
Calcium: 9.3 mg/dL (ref 8.6–10.4)
Chloride: 105 mmol/L (ref 98–110)
Creat: 0.71 mg/dL (ref 0.60–0.95)
Globulin: 2.1 g/dL (calc) (ref 1.9–3.7)
Glucose, Bld: 109 mg/dL — ABNORMAL HIGH (ref 65–99)
Potassium: 4.7 mmol/L (ref 3.5–5.3)
Sodium: 140 mmol/L (ref 135–146)
Total Bilirubin: 1 mg/dL (ref 0.2–1.2)
Total Protein: 6.6 g/dL (ref 6.1–8.1)
eGFR: 84 mL/min/{1.73_m2} (ref >=60)

## 2021-05-07 LAB — LIPID PANEL
Cholesterol: 134 mg/dL (ref <200)
HDL: 35 mg/dL — ABNORMAL LOW (ref >=50)
LDL Cholesterol (Calc): 75 mg/dL (calc)
Non-HDL Cholesterol (Calc): 99 mg/dL (calc) (ref <130)
Total CHOL/HDL Ratio: 3.8 (calc) (ref <5.0)
Triglycerides: 165 mg/dL — ABNORMAL HIGH (ref <150)

## 2021-05-07 LAB — MAGNESIUM: Magnesium: 2.2 mg/dL (ref 1.5–2.5)

## 2021-05-07 LAB — MICROALBUMIN / CREATININE URINE RATIO
Creatinine, Urine: 27 mg/dL (ref 20–275)
Microalb, Ur: <0.2 mg/dL

## 2021-05-07 LAB — VITAMIN D 25 HYDROXY (VIT D DEFICIENCY, FRACTURES): Vit D, 25-Hydroxy: 79 ng/mL (ref 30–100)

## 2021-05-07 LAB — TSH: TSH: 6.05 mIU/L — ABNORMAL HIGH (ref 0.40–4.50)

## 2021-05-07 LAB — HEMOGLOBIN A1C
Hgb A1c MFr Bld: 6.3 % of total Hgb — ABNORMAL HIGH (ref <5.7)
Mean Plasma Glucose: 134 mg/dL
eAG (mmol/L): 7.4 mmol/L

## 2021-05-07 LAB — INSULIN, RANDOM: Insulin: 12.3 u[IU]/mL

## 2021-05-14 NOTE — Progress Notes (Signed)
    Future Appointments  Date Time Provider [place]  05/15/2021 11:30 AM Unk Pinto, MD [facility] None  10/12/2021 10:30 AM Liane Comber, NP [facility] None  05/06/2022 10:00 AM Unk Pinto, MD [facility] None    History of Present Illness:    Patient is a very nice 85 yo MWF seen recently with several lab abnormalities. Moderately overweight with BMI 28+ with truncal obesity.  A1c was recently elevated at 6.3% . In  2012, she met criterion for T2_DM with A1c 6.5% and elevated fasting Insulin of 60 and has been managing her diabetes with diet.  Also, recent TSH was elevated at 6.05  ( 0.40 - 4.50 ) - was on skin, hair, nails supplement  - and was advised to stop any supplements with Biotin.    Medications    atenolol  100 MG tablet, Take 1 tablet Daily for BP   atorvastatin 80 MG tablet, Take 1/2  tablet daily as directed for cholesterol   verapamil 120 MG tablet, Take 1 tablet 2 x /day for BP   aspirin 81 MG tablet, Take daily.   meloxicam 15 MG tablet, Take 1/2 to 1 tablet daily    vitamin B-12  1000 MCG tablet, Take  daily.   VITAMIN D 5,000 Units, Take   2  times daily.    gabapentin 600 MG tablet, Take  1/2 to 1 tablet  3 x /day  for Pain   Multiple Vitamin , Take 1 tablet by mouth daily.   Multiple Vitamins-Minerals, Take  daily.   omeprazole 20 MG capsule, Take 1 capsule every day   Problem list She has Essential hypertension; Vitamin D deficiency; Medication management; Diet-controlled diabetes mellitus ([place]); Encounter for Medicare annual wellness exam; Chronic obstructive pulmonary disease ([place]); Overweight (BMI 25.0-29.9); Gastroesophageal reflux disease without esophagitis; Unsteady gait; CKD stage 2 due to type 2 diabetes mellitus ([place]); Hyperlipidemia associated with type 2 diabetes mellitus ([place]); Aortic atherosclerosis ([place]) by CXR 05/12/2017; and Paresthesia and pain of both upper extremities on their problem list.   Observations/Objective:  BP  135/66   Pulse 62   Temp 97.8 F (36.6 C)   Resp 16   Ht _0  (1.6 m)   Wt 162 lb 3.2 oz (73.6 kg)   SpO2 98%   BMI 28.73 kg/m   HEENT - WNL. Neck - supple.  Chest - Clear equal BS. Cor - Nl HS. RRR w/o sig MGR. PP 1(+). No edema. MS- FROM w/o deformities.  Gait Nl. Neuro -  Nl w/o focal abnormalities.  Assessment and Plan:   1. Type 2 diabetes mellitus with stage 2 chronic kidney disease, without long-term current use of insulin (HCC)  - initially ordered Mounjaro 2.5 mg, but not on formulary   - Dulaglutide (TRULICITY) 6.80 SU/1.1SR ; Inject 0.75 mg into the skin once a week.  Disp 2 mL; Refill: 0  & advised to calll for refill increasing dose to 1.5 mg   Follow Up Instructions:        I discussed the assessment and treatment plan with the patient. The patient was provided an opportunity to ask questions and all were answered. The patient agreed with the plan and demonstrated an understanding of the instructions.       The patient was advised to call back or seek an in-person evaluation if the symptoms worsen or if the condition fails to improve as anticipated.    Kirtland Bouchard, MD

## 2021-05-15 ENCOUNTER — Other Ambulatory Visit: Payer: Self-pay

## 2021-05-15 ENCOUNTER — Ambulatory Visit (INDEPENDENT_AMBULATORY_CARE_PROVIDER_SITE_OTHER): Payer: PPO | Admitting: Internal Medicine

## 2021-05-15 ENCOUNTER — Encounter: Payer: Self-pay | Admitting: Internal Medicine

## 2021-05-15 VITALS — BP 135/66 | HR 62 | Temp 97.8°F | Resp 16 | Ht 63.0 in | Wt 162.2 lb

## 2021-05-15 DIAGNOSIS — N182 Chronic kidney disease, stage 2 (mild): Secondary | ICD-10-CM

## 2021-05-15 DIAGNOSIS — E1122 Type 2 diabetes mellitus with diabetic chronic kidney disease: Secondary | ICD-10-CM | POA: Diagnosis not present

## 2021-05-15 MED ORDER — TRULICITY 0.75 MG/0.5ML ~~LOC~~ SOAJ: 0.7500 mg | SUBCUTANEOUS | 0 refills | Status: DC

## 2021-05-23 ENCOUNTER — Telehealth: Payer: Self-pay | Admitting: Internal Medicine

## 2021-05-23 NOTE — Chronic Care Management (AMB) (Signed)
  Chronic Care Management   Note  05/23/2021 Name: Debra Collins MRN: [id number] DOB: 1936/04/01  Debra Collins is a 85 y.o. year old female who is a primary care patient of Unk Pinto, MD. I reached out to [name] by phone today in response to a referral sent by Debra Collins's PCP, Unk Pinto, MD.   Ms. Tracey was given information about Chronic Care Management services today including:  CCM service includes personalized support from designated clinical staff supervised by her physician, including individualized plan of care and coordination with other care providers 24/7 contact phone numbers for assistance for urgent and routine care needs. Service will only be billed when office clinical staff spend 20 minutes or more in a month to coordinate care. Only one practitioner may furnish and bill the service in a calendar month. The patient may stop CCM services at any time (effective at the end of the month) by phone call to the office staff.   Patient agreed to services and verbal consent obtained.   Follow up plan:   Tatjana Secretary/administrator

## 2021-06-10 ENCOUNTER — Other Ambulatory Visit: Payer: Self-pay | Admitting: Internal Medicine

## 2021-06-10 MED ORDER — TRULICITY 1.5 MG/0.5ML ~~LOC~~ SOAJ: 1.5000 mg | SUBCUTANEOUS | 0 refills | Status: DC

## 2021-06-11 ENCOUNTER — Ambulatory Visit (INDEPENDENT_AMBULATORY_CARE_PROVIDER_SITE_OTHER): Payer: PPO

## 2021-06-11 ENCOUNTER — Other Ambulatory Visit: Payer: Self-pay

## 2021-06-11 DIAGNOSIS — E039 Hypothyroidism, unspecified: Secondary | ICD-10-CM

## 2021-06-11 LAB — TSH: TSH: 4.57 mIU/L — ABNORMAL HIGH (ref 0.40–4.50)

## 2021-06-11 NOTE — Progress Notes (Signed)
Patient presents to the office for a nurse visit to have TSH re-checked. No longer taking Biotin since her last labs were done. No questions or concerns. Vitals taken and recorded.

## 2021-06-12 ENCOUNTER — Other Ambulatory Visit: Payer: Self-pay | Admitting: Internal Medicine

## 2021-06-12 DIAGNOSIS — E039 Hypothyroidism, unspecified: Secondary | ICD-10-CM

## 2021-06-12 MED ORDER — LEVOTHYROXINE SODIUM 50 MCG PO TABS: ORAL_TABLET | ORAL | 1 refills | Status: DC

## 2021-06-12 NOTE — Progress Notes (Signed)
============================================================ -   Test results slightly outside the reference range are not unusual. If there is anything important, I will review this with you,  otherwise it is considered normal test values.  If you have further questions,  please do not hesitate to contact me at the office or via My Chart.  ============================================================ ============================================================  -  TSH still elevated means thyroid hormone is low in blood &                                                                             can account for weight gain !   - S sent in new Rx to start you on Thyroid hormone &                                   recommend that you schedule a Nurse Visit in 1 month to                                   recheck blood level to assure correct dosing   ============================================================ ============================================================

## 2021-06-21 ENCOUNTER — Other Ambulatory Visit: Payer: Self-pay

## 2021-06-21 ENCOUNTER — Ambulatory Visit: Payer: PPO | Admitting: Pharmacist

## 2021-06-21 DIAGNOSIS — E1169 Type 2 diabetes mellitus with other specified complication: Secondary | ICD-10-CM

## 2021-06-21 DIAGNOSIS — E119 Type 2 diabetes mellitus without complications: Secondary | ICD-10-CM

## 2021-06-21 DIAGNOSIS — E785 Hyperlipidemia, unspecified: Secondary | ICD-10-CM

## 2021-06-21 DIAGNOSIS — I10 Essential (primary) hypertension: Secondary | ICD-10-CM

## 2021-06-21 DIAGNOSIS — J449 Chronic obstructive pulmonary disease, unspecified: Secondary | ICD-10-CM

## 2021-06-21 DIAGNOSIS — Z79899 Other long term (current) drug therapy: Secondary | ICD-10-CM

## 2021-06-29 ENCOUNTER — Encounter: Payer: Self-pay | Admitting: Internal Medicine

## 2021-06-29 NOTE — Progress Notes (Signed)
Patient Visit with Chart Prep  Debra Collins, Debra Collins [id number] 85 years, Female  DOB: 1936-02-05  M: [id number] Care Team:   Orlene Plum, Ladona Ridgel    __________________________________________________ Summary: Debra Collins is a pleasant 85 year old patient who presents to complete CCM initial visit. She has no chief complaints today  Recommendations/Changes made from today's visit: Recommended starting Albuterol inhaler for help with symptoms of wheezing and shortness of breath. Recommend patient follow with pulmonology Pt requested an additional Rx for Trulicity as she accidentally wasted a pen. Informed patient that she may call Trulicity help-line for replacement. Will also request additional refills for Trulicity   Patient scheduled for CCM visit with the clinical pharmacist.  Patient is referred for CCM by their PCP and CPP is under general PCP supervision.: At least 2 of these conditions are expected to last 12 months or longer and patient is at significant risk for acute exacerbations and/or functional decline.  Patient has consented to participation in CCM program. Visit Type: Phone Call Date of Upcoming Visit: 06/21/2021  Patient Chart Prep [facility])  Chronic Conditions Patient's Chronic Conditions: Hypertension (HTN), Chronic Obstructive Pulmonary Disease (COPD), Gastroesophageal Reflux Disease (GERD), Hyperlipidemia/Dyslipidemia (HLD), Diabetes (DM), Vitamin D deficiency   Doctor and Hospital Visits Were there PCP Visits in last 6 months?: Yes Visit #1: 01/12/2021-Dana Mull, NP-Patient presented for 3 month follow up. BP 160/72, HR 68. Stopped Famotidine 20mg  and Olmesartan 20 Visit #2: 05/04/2021- Dr. Ansel Bong presented for AWV. BP 180/90, HR 64. Changed Gabapentin from 100mg  3 times daily to 600mg  3 times daily.  Visit #3: 05/15/2021- Dr. Ansel Bong presented for follow up. BP 135/66, HR 62. Start Dulaglutide 0.75mg  weekly.  Were there Specialist Visits in  last 6 months?: No Was there a Hospital Visit in last 30 days?: No Were there other Hospital Visits in last 6 months?: No  Medication Information Are there any Medication discrepancies?: No Are there any Medication adherence gaps (beyond 5 days past due)?: No Medication adherence rates for the STAR rating drugs: Atorvastatin 80mg  -05/15/21, 90 DS List Patient's current Care Gaps: No current Care Gaps identified  Pre-Call Questions [facility]) Are you able to connect with Patient: No Were you able to leave a message?: Yes Relevant details: 06/19/21- Left detailed VM and to return call.   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 He currently has hearing aids that broke and need to be repaired. she has 2 sons who live in town; one comes once a week on Sunday Who does the patient spend their time with and what do they do?: Husband and cats Lifestyle habits such as diet and exercise?: Diet: Cereal and blueberries for breakfast, meatloaf and beans for lunch, shrimp fried rice for dinner.  Pt admits that she does not drink enough water (2 glasses per day): She drinks green tea for lunch, and has 1 coffee in the morning. Alcohol, tobacco, and illicit drug usage?: 1 glass of wine at night What is the patient's sleep pattern?: Trouble staying asleep, Other (with free form text), Trouble going back to  sleep after waking up Details: Pt has difficulty breathing at night. Cat paws her face at night which wakes her up. She also  wakes up to use the bathroom How many hours per night does patient typically sleep?: 6 hours. Tried melatonin but it did not work, she ended up feeling groggy Patient pleased with health care they are receiving?: Yes Family, occupational, and living circumstances relevant to overall health?: Mother (Deceased)  Heart disease  Stroke   Father (Deceased)  Heart attack  Heart disease   Sister (Alive)  Heart attack   Brother Metallurgist)  Child - x3 Metallurgist)  Name and location of Current pharmacy: [id number] - [place], [place] - [address] (Ph: [phone]) Current Rx insurance plan: HTA Are meds synced by current pharmacy?: No Are meds delivered by current pharmacy?: No - delivery not available Would patient benefit from direct intervention of clinical lead in dispensing process to optimize clinical outcomes?: Yes Are [facility] pharmacy services available where patient lives?: Yes Is patient disadvantaged to use [facility]?: No [facility] services reviewed with patient and patient wishes to change pharmacy?: No Select reason patient declined to change pharmacies: Patient preference Does patient experience delays in picking up medications due to transportation concerns (getting to pharmacy)?: No Any additional demeanor/mood notes?: Debra Collins is a pleasant 85 year old patient who presents to complete CCM initial visit. She has no chief complaints today  Hypertension (HTN) Assess this condition today?: Yes Is patient able to obtain BP reading today?: Yes BP today is: 149/86 Heart Rate is: 71 Goal: <130/80 mmHG Hypertension Stage: Stage 2 (SBP >140 or DBP > 90) Is Patient checking BP at home?: Yes Pt. home BP readings are ranging: 135/56, 145/75, 136/84, 137/73, 140/ 84, 121/69, 126/68, average: 134/72 How often does patient miss taking their blood pressure medications?: Pt states that she may forget 1 time a week but then will take it later in the  day. Has patient experienced hypotension, dizziness, falls or bradycardia?: No Check present secondary causes (below) for HTN: Drug induced (NSAID, steroids) BP RPM device: Does patient qualify?: Yes We discussed: Reducing the amount of salt intake to 1500mg /per day., Limiting the amount of alcohol to 1 or 2 drinks per day., Proper Home BP Measurement Assessment:: Controlled Drug: Atenolol 100mg  QD Assessment: Appropriate, Effective, Safe, Accessible Drug: Olmesartan 40mg  QD Assessment: Appropriate, Effective, Safe, Accessible Drug: Verapamil 120mg  BID Assessment: Appropriate, Effective, Safe, Accessible HC Follow up: N/A Pharmacist Follow up: Continue to monitor BP, CMP, etc  Hyperlipidemia/Dyslipidemia (HLD) Last Lipid panel on: 05/03/2021 TC (Goal<200): 134 LDL: 75 HDL (Goal>40): 35 TG (Goal<150): 165 ASCVD 10-year risk?is:: N/A due to Age > 79 Assess this condition today?: Yes LDL Goal: <70 Has patient tried and failed any HLD Medications?: No Check present secondary causes (below) that can lead to increased cholesterol levels (multi-choice optional): Beta blockers, Diabetes We discussed: How to reduce cholesterol through diet/weight management and physical activity., How a diet high in plant sterols (fruits/vegetables/nuts/whole grains/legumes) may reduce your cholesterol., Encouraged increasing fiber to a daily intake of 10-25g/day Assessment:: Uncontrolled Drug: Atorvastatin 80mg  1/2 tablet QD Assessment: Appropriate, Query Effectiveness HC Follow up: N/A Pharmacist Follow up: Continue to monitor lipid panel, LFT, s/s of rhabdo  Diabetes (DM) Current A1C: 6.3 taken on: 05/03/2021 Previous A1C: 6.4 taken on: 01/11/2021 Type: 2 The current microalbumin ratio is: <30 tested on: 05/03/2021 Last DM Eye Exam on: 03/11/2021 Assess this condition today?: Yes Goal A1C: < 7.0 % Type:  2 Is Patient taking statin medication?: Yes Is patient taking ACEi / ARB?: Yes DM RPM  device: Does patient qualify?: Yes Patient checking Blood Sugar at home?: No Has patient experienced any hypoglycemic episodes?: Yes Details: Pt mentioned feeling shaky but never new it could attributed to hypoglycemia. She will need a new Rx for testing supplies Diet recall discussed: No We discussed: How to recognize and treat signs of hypoglycemia., Modifying lifestyle, including to participate in moderate physical activity (e.g., walking) at least 150 minutes per week., Diet and exercise extensively Assessment:: Controlled Drug: Trulicity 1.5mg  every week Assessment: Appropriate, Effective, Safe, Accessible Plan to Start: Will order a new prescription for testing supplies HC Follow up: Diabetes assessment in January (focus on assessing low blood sugars less than 70) Pharmacist Follow up: Continue to assess A1c, FBG, monitor for low blood sugar  Chronic Obstructive Pulmonary Disease (COPD) Current Eosinophils: 223 taken on: 05/03/2021  Assess this condition today?: Yes Gold group: A (low sx, < 2 exacerbations / yr) Exacerbations in past year without hospitalization: No Is patient currently Smoking or Vaping?: No Did patient smoke/vape before?: No Home oxygen therapy: No Frequency of SABA/SAMA use: Never Influenza vaccine: Yes Prevnar vaccine: Yes Pneumovax vaccine: Yes We counseled the patient on:: Treatment goals (documented in Care Plan) Assessment:: Controlled Drug: None Additional Info: Pt endorses difficulty breathing/wheezing at night which is attributed to allergies (pt is allergic to cats and may have cat hairs in the bed) Counseled patient on allergen prevention and patient could benefit from Albuterol rescue inhaler need albuterol RX Flu shot months ago Plan to Start: Albuterol inhaler as needed for wheezing and shortness of breath Plan to Counsel: Allergen prevention HC Follow up: N/A Pharmacist Follow up: Continue to monitor breathing, s/s exacerbation  Exercise,  Diet and Non-Drug Coordination Needs Additional exercise counseling points. We discussed: decreasing sedentary behavior, incorporating flexibility, balance, and strength training exercises Additional diet counseling points. We discussed: aiming to consume at least 8 cups of water day, key components of the DASH diet Discussed Non-Drug Care Coordination Needs: Yes Does Patient have Medication financial barriers?: No  Accountable Health Communities Health-Related Social Needs Screening Tool -  SDOH  ([employer]) What is your living situation today? (ref #1): I have a steady place to live Think about the place you live. Do you have problems with any of the following? (ref #2): None of the above Within the past 12 months, you worried that your food would run out before you got money to buy more (ref #3): Never true Within the past 12 months, the food you bought just didn't last and you didn't have money to get more (ref #4): Never true In the past 12 months, has lack of reliable transportation kept you from medical appointments, meetings, work or from getting things needed for daily living? (ref #5): No In the past 12 months, has the electric, gas, oil, or water company threatened to shut off services in your home? (ref #6): No How often does anyone, including family and friends, physically hurt you? (ref #7): Never (1) How often does anyone, including family and friends, insult or talk down to you? (ref #8): Never (1) How often does anyone, including friends and family, threaten you with harm? (ref #9): Never (1) How often does anyone, including family and friends, scream or curse at you? (ref #10): Never (1)  Engagement Notes Charlett Nose on 06/21/2021 01:23 PM CPP Chart Review: 12 min  CPP Office Visit: 45 min  CPP Office  Visit Documentation: 79 min  CPP Coordination of Care: 15 min  CPP Care Plan Completion and Review: 35  min HC Chart Prep: .  Engagement Notes Charlett Nose on 06/22/2021 09:53 AM [facility] F/u:  January Assessment: Diabetes, focus on assessing if patient has had any low blood sugars below 70 in the past month  CPP F/u:  10/12/21: Sudie Grumbling w/ Morrie Sheldon 01/01/22: CCM phone follow-up (Diabetes, assess breathing, HTN)  Care Gaps:  Influenza: Pt completed in October 2022 COVID booster: Pt will consider Shingrix: Pt will consider    Patient Name:? Severns,Allanna DOB:?? 1936-02-26 ?  Last Care Plan Update:? 06/21/2021 COMPREHENSIVE CARE PLAN AND GOALS?    HYPERTENSION   MOST RECENT BLOOD PRESSURE:      135/66 mmHg MY GOAL BLOOD PRESSURE:   <130/80 mmHg CURRENT MEDICATION AND DOSING:   Atenolol 100mg  daily Olmesartan 40mg  daily Verapamil 120mg  2 times a day THE GOALS WE HAVE CHOSEN ARE:     -Maintain an at goal blood pressure   BARRIERS TO ACHIEVING GOALS:   -Controlled PLAN TO WORK ON THESE GOALS:   -Take medications regularly    -Check BP at home     -Reduce salt intake (< 1500mg / day)   -Diet: DASH diet (Choose fruits, vegetables, and low-fat dairy products. Increase whole grains, fish, poultry, nuts. Reduce red meats and sugars)   -Weight: 1 kg = ~1 mmHg reduction   -Exercise    CHOLESTEROL   MOST RECENT LABS:     05/03/2021 -TOTAL CHOLESTEROL: 134  -TRIGLYCERIDES: 165  -HDL: 35  -LDL: 75  CURRENT MEDICATION AND DOSING:   Atorvastatin 80mg  1/2 tablet daily THE GOALS WE HAVE CHOSEN ARE:     -Total Cholesterol goal under 200, Triglycerides goal under 150, HDL goal above 40, LDL goal under 100   BARRIERS TO ACHIEVING GOALS:   -Diet PLAN TO WORK ON THESE GOALS:   -Take medication regularly   -Diet: high in plant sterols (fruits/ vegetables/ nuts/ whole grains/ legumes). Increase fiber intake (10-25g/day). Avoid foods high in cholesterol (red meat, egg yolks, dairy, oils/ butter). Choose low-fat options.   -Exercise   -Weight Management    DIABETES   MOST RECENT A1C:   6.3% on  05/03/2021      MY GOAL A1C:   <7% LAST EYE EXAM:  03/11/2021 CURRENT MEDICATION AND DOSING:  Trulicity 1.5mg  every week THE GOALS WE HAVE CHOSEN ARE:    -Maintain an at goal A1C level   BARRIERS TO ACHIEVING GOALS:   -Controlled PLAN TO WORK ON THESE GOALS:   -Take medications as prescribed   -Check blood glucose daily    -Diet: natural carbs and sugars (vegetables, fruits, whole grains, legumes, dairy), avoid alcohol, reduce carbohydrate intake and processed sugars. Maintain a low carbohydrate diet, eating 45 grams of carbohydrates per meal (3 servings of carbohydrates per meal), 15 grams of carbohydrates per snack (1 serving of carbohydrate per snack)   -Weight Loss: waist circumference goal < 35 inches for females; < 40 inches for males   -Exercise: 150 minutes of moderate-intensity aerobic activity per week (at least 3 days)     -Discussed Technique: testing / injection as applicable   -Foot care: wash, dry examine feet daily. Moisturize the top and bottom (not in between). Trim nails with nail file (no sharp edges). Wear socks and shoes. Elevate feet when sitting. Annual foot exam by podiatrist.     -Recognize sign and symptoms of hypoglycemia and understand treatment as outlined below   -  Hypoglycemia sign and symptoms: dizziness, anxiety/ irritability, shakiness, headache, diaphoresis, hunger, confusion, nausea, ataxia, tremors, palpitations/ tachycardia, blurred vision   -Hypoglycemia Treatment: Rule of 15:    (1) 15-20 grams of glucose/ simple carb (4 oz juice, 8oz milk, 4oz regular soda, 1 tablespoon sugar/ honey/ corn syrup, 3-4 glucose tabs/ 1 serving glucose gel)   (2) Recheck BG after 15 mins. If hypoglycemia continues   (3) Repeat steps 1&2, when BG normalizes, eat small meal or snack    COPD   EOSINOPHIL COUNT:      -EOS ABSOLUTE: 223 on 05/03/2021   CURRENT MEDICATION AND DOSING:   None THE GOALS WE HAVE CHOSEN ARE:   -Reduce shortness of breath and reduce  hospitalizations due to COPD exacerbations   BARRIERS TO ACHIEVING GOALS:   -Lack  of maintenance inhaler and rescue inhaler PLAN TO WORK ON THESE GOALS:     -Will request a rescue inhaler for shortness of breath, Albuterol. I recommend following with pulmonologist for maintenance inhaler and further testing. -Educated on inhaler usage and discussed technique   -Educated on ways to prevent exacerbation of COPD   -Stay up to date on influenza and pneumococcal immunizations     -Call CPP or MD immediately if: more coughing, wheezing, SOB than usual; changes in color, thickness or amount of mucus; feeling tired for more than one day; swelling of legs or ankles; trouble sleeping; feeling the need to increase oxygen (low oxygen levels on pulse ox)   -Call 911: severe shortness of breath or chest pain; blue color in lips, fingers; confusion, disorientation, difficulty speaking in full sentences   ?  ACTIVE MEDICATION LIST   Your current medication list has been updated. To view, log in to your patient portal.   Call if any changes need to be made.      MEDICATION REVIEW   MEDICATION REVIEW CONDUCTED:   Yes    DATE:    06/21/2021  BEST POSSIBLE MEDICATION HISTORY  SOURCE:   Medical Records  ??    HOW DO I? - WHEN DO I?     GET AHOLD OF MY DOCTOR?    DURING BUSINESS HOURS WHEN THE OFFICE IS OPEN    PHONE: [phone]  AFTER HOURS [facility] NURSE WHEN THE OFFICE IS CLOSED   PHONE: [phone]   TALK TO MY CARE COORDINATOR NAME: Charlett Nose   PHONE: [phone]  EMAIL:  [email or web address]  CARE COORDINATOR STAFF   NAME: Gwenevere Abbot, Gareth Morgan, Ceasar Lund   PHONE: [phone]    NOTE SECTION   Thank you for participating in the Chronic Care Management (CCM) program with Dr. Charlett Nose     This program takes a proactive approach to your health and my team will serve as a resource for you throughout the year. Please follow up at [phone] if you  have any questions or experience changes to your overall health. Your next CCM appointment will be conducted with Charlett Nose, PharmD as follows:      Date: 01/01/2021   Time:   9:00 am Over the Phone   ?    Dortha Kern Abran Richard, PharmD  Clinical Pharmacist  [email or web address]  [phone]

## 2021-06-30 ENCOUNTER — Other Ambulatory Visit: Payer: Self-pay | Admitting: Internal Medicine

## 2021-06-30 MED ORDER — TRULICITY 3 MG/0.5ML ~~LOC~~ SOAJ: 3.0000 mg | SUBCUTANEOUS | 0 refills | Status: DC

## 2021-07-05 ENCOUNTER — Encounter: Payer: Self-pay | Admitting: Internal Medicine

## 2021-07-09 ENCOUNTER — Other Ambulatory Visit: Payer: Self-pay | Admitting: Internal Medicine

## 2021-07-09 MED ORDER — TRULICITY 3 MG/0.5ML ~~LOC~~ SOAJ: 3.0000 mg | SUBCUTANEOUS | 0 refills | Status: DC

## 2021-07-12 DIAGNOSIS — I10 Essential (primary) hypertension: Secondary | ICD-10-CM | POA: Diagnosis not present

## 2021-07-12 DIAGNOSIS — E785 Hyperlipidemia, unspecified: Secondary | ICD-10-CM | POA: Diagnosis not present

## 2021-07-12 DIAGNOSIS — E1169 Type 2 diabetes mellitus with other specified complication: Secondary | ICD-10-CM | POA: Diagnosis not present

## 2021-07-12 DIAGNOSIS — E119 Type 2 diabetes mellitus without complications: Secondary | ICD-10-CM | POA: Diagnosis not present

## 2021-07-13 ENCOUNTER — Telehealth: Payer: Self-pay

## 2021-07-13 NOTE — Progress Notes (Signed)
Care plan printed and mailed to patient.  Debra Collins, Health Concierge [phone]

## 2021-08-27 ENCOUNTER — Telehealth: Payer: Self-pay

## 2021-08-27 NOTE — Telephone Encounter (Signed)
Called patient for diabetes review call. Unable to reach pt. left detailed message to return call.  Total time spent: [place], [facility]

## 2021-09-24 DIAGNOSIS — L570 Actinic keratosis: Secondary | ICD-10-CM | POA: Diagnosis not present

## 2021-09-24 DIAGNOSIS — L821 Other seborrheic keratosis: Secondary | ICD-10-CM | POA: Diagnosis not present

## 2021-09-30 ENCOUNTER — Other Ambulatory Visit: Payer: Self-pay | Admitting: Internal Medicine

## 2021-09-30 MED ORDER — TRULICITY 4.5 MG/0.5ML ~~LOC~~ SOAJ: SUBCUTANEOUS | 2 refills | Status: DC

## 2021-10-04 ENCOUNTER — Encounter: Payer: Self-pay | Admitting: Internal Medicine

## 2021-10-12 ENCOUNTER — Ambulatory Visit (INDEPENDENT_AMBULATORY_CARE_PROVIDER_SITE_OTHER): Payer: PPO | Admitting: Adult Health

## 2021-10-12 ENCOUNTER — Encounter: Payer: Self-pay | Admitting: Adult Health

## 2021-10-12 VITALS — BP 136/86 | HR 65 | Temp 96.6°F | Wt 158.0 lb

## 2021-10-12 DIAGNOSIS — Z0000 Encounter for general adult medical examination without abnormal findings: Secondary | ICD-10-CM

## 2021-10-12 DIAGNOSIS — E785 Hyperlipidemia, unspecified: Secondary | ICD-10-CM | POA: Diagnosis not present

## 2021-10-12 DIAGNOSIS — R6889 Other general symptoms and signs: Secondary | ICD-10-CM

## 2021-10-12 DIAGNOSIS — I10 Essential (primary) hypertension: Secondary | ICD-10-CM

## 2021-10-12 DIAGNOSIS — K219 Gastro-esophageal reflux disease without esophagitis: Secondary | ICD-10-CM

## 2021-10-12 DIAGNOSIS — F4321 Adjustment disorder with depressed mood: Secondary | ICD-10-CM

## 2021-10-12 DIAGNOSIS — E1122 Type 2 diabetes mellitus with diabetic chronic kidney disease: Secondary | ICD-10-CM

## 2021-10-12 DIAGNOSIS — Z79899 Other long term (current) drug therapy: Secondary | ICD-10-CM | POA: Diagnosis not present

## 2021-10-12 DIAGNOSIS — E559 Vitamin D deficiency, unspecified: Secondary | ICD-10-CM | POA: Diagnosis not present

## 2021-10-12 DIAGNOSIS — E663 Overweight: Secondary | ICD-10-CM | POA: Diagnosis not present

## 2021-10-12 DIAGNOSIS — I700 Atherosclerosis of aorta: Secondary | ICD-10-CM | POA: Diagnosis not present

## 2021-10-12 DIAGNOSIS — E782 Mixed hyperlipidemia: Secondary | ICD-10-CM | POA: Diagnosis not present

## 2021-10-12 DIAGNOSIS — Z0001 Encounter for general adult medical examination with abnormal findings: Secondary | ICD-10-CM | POA: Diagnosis not present

## 2021-10-12 DIAGNOSIS — N182 Chronic kidney disease, stage 2 (mild): Secondary | ICD-10-CM

## 2021-10-12 DIAGNOSIS — E1169 Type 2 diabetes mellitus with other specified complication: Secondary | ICD-10-CM | POA: Diagnosis not present

## 2021-10-12 DIAGNOSIS — J449 Chronic obstructive pulmonary disease, unspecified: Secondary | ICD-10-CM

## 2021-10-12 DIAGNOSIS — R2681 Unsteadiness on feet: Secondary | ICD-10-CM | POA: Diagnosis not present

## 2021-10-12 MED ORDER — ATORVASTATIN CALCIUM 40 MG PO TABS: ORAL_TABLET | ORAL | 3 refills | Status: DC

## 2021-10-12 MED ORDER — BUPROPION HCL ER (XL) 150 MG PO TB24: 150.0000 mg | ORAL_TABLET | ORAL | 0 refills | Status: DC

## 2021-10-12 NOTE — Patient Instructions (Signed)
Try breztri - breathe out then take 1 deep puffs (an hold in lungs 10-15 sec before breathing out) twice daily, brush teeth/gargle after each use - let me know if this seems to help your breathing symptoms ? ? ?Wellbutrin 1 tab daily in the morning for mood/motivation/energy -  ?Let me know how you are doing in about 1 month  ? ? ? ?Bupropion Extended-Release Tablets (Depression/Mood Disorders) ?What is this medication? ?BUPROPION (byoo PROE pee on) treats depression. It increases norepinephrine and dopamine in the brain, hormones that help regulate mood. It belongs to a group of medications called NDRIs. ?This medicine may be used for other purposes; ask your health care provider or pharmacist if you have questions. ?COMMON BRAND NAME(S): Aplenzin, Budeprion XL, Forfivo XL, Wellbutrin XL ?What should I tell my care team before I take this medication? ?They need to know if you have any of these conditions: ?An eating disorder, such as anorexia or bulimia ?Bipolar disorder or psychosis ?Diabetes or high blood sugar, treated with medication ?Glaucoma ?Head injury or brain tumor ?Heart disease, previous heart attack, or irregular heart beat ?High blood pressure ?Kidney or liver disease ?Seizures (convulsions) ?Suicidal thoughts or a previous suicide attempt ?Tourette's syndrome ?Weight loss ?An unusual or allergic reaction to bupropion, other medications, foods, dyes, or preservatives ?Pregnant or trying to become pregnant ?Breast-feeding ?How should I use this medication? ?Take this medication by mouth with a glass of water. Follow the directions on the prescription label. You can take it with or without food. If it upsets your stomach, take it with food. Do not crush, chew, or cut these tablets. This medication is taken once daily at the same time each day. Do not take your medication more often than directed. Do not stop taking this medication suddenly except upon the advice of your care team. Stopping this  medication too quickly may cause serious side effects or your condition may worsen. ?A special MedGuide will be given to you by the pharmacist with each prescription and refill. Be sure to read this information carefully each time. ?Talk to your care team about the use of this medication in children. Special care may be needed. ?Overdosage: If you think you have taken too much of this medicine contact a poison control center or emergency room at once. ?NOTE: This medicine is only for you. Do not share this medicine with others. ?What if I miss a dose? ?If you miss a dose, skip the missed dose and take your next tablet at the regular time. Do not take double or extra doses. ?What may interact with this medication? ?Do not take this medication with any of the following: ?Linezolid ?MAOIs like Azilect, Carbex, Eldepryl, Marplan, Nardil, and Parnate ?Methylene blue (injected into a vein) ?Other medications that contain bupropion like Zyban ?This medication may also interact with the following: ?Alcohol ?Certain medications for anxiety or sleep ?Certain medications for blood pressure like metoprolol, propranolol ?Certain medications for depression or psychotic disturbances ?Certain medications for HIV or AIDS like efavirenz, lopinavir, nelfinavir, ritonavir ?Certain medications for irregular heart beat like propafenone, flecainide ?Certain medications for Parkinson's disease like amantadine, levodopa ?Certain medications for seizures like carbamazepine, phenytoin, phenobarbital ?Cimetidine ?Clopidogrel ?Cyclophosphamide ?Digoxin ?Furazolidone ?Isoniazid ?Nicotine ?Orphenadrine ?Procarbazine ?Steroid medications like prednisone or cortisone ?Stimulant medications for attention disorders, weight loss, or to stay awake ?Tamoxifen ?Theophylline ?Thiotepa ?Ticlopidine ?Tramadol ?Warfarin ?This list may not describe all possible interactions. Give your health care provider a list of all the medicines, herbs, non-prescription  drugs, or dietary supplements you use. Also tell them if you smoke, drink alcohol, or use illegal drugs. Some items may interact with your medicine. ?What should I watch for while using this medication? ?Tell your care team if your symptoms do not get better or if they get worse. Visit your care team for regular checks on your progress. Because it may take several weeks to see the full effects of this medication, it is important to continue your treatment as prescribed. ?Watch for new or worsening thoughts of suicide or depression. This includes sudden changes in mood, behavior, or thoughts. These changes can happen at any time but are more common in the beginning of treatment or after a change in dose. Call your care team right away if you experience these thoughts or worsening depression. ?Manic episodes may happen in patients with bipolar disorder who take this medication. Watch for changes in feelings or behaviors such as feeling anxious, nervous, agitated, panicky, irritable, hostile, aggressive, impulsive, severely restless, overly excited and hyperactive, or trouble sleeping. These symptoms can happen at anytime but are more common in the beginning of treatment or after a change in dose. Call your care team right away if you notice any of these symptoms. ?This medication may cause serious skin reactions. They can happen weeks to months after starting the medication. Contact your care team right away if you notice fevers or flu-like symptoms with a rash. The rash may be red or purple and then turn into blisters or peeling of the skin. Or, you might notice a red rash with swelling of the face, lips or lymph nodes in your neck or under your arms. ?Avoid drinks that contain alcohol while taking this medication. Drinking large amounts of alcohol, using sleeping or anxiety medications, or quickly stopping the use of these agents while taking this medication may increase your risk for a seizure. ?Do not drive or use  heavy machinery until you know how this medication affects you. This medication can impair your ability to perform these tasks. ?Do not take this medication close to bedtime. It may prevent you from sleeping. ?Your mouth may get dry. Chewing sugarless gum or sucking hard candy, and drinking plenty of water may help. Contact your care team if the problem does not go away or is severe. ?The tablet shell for some brands of this medication does not dissolve. This is normal. The tablet shell may appear whole in the stool. This is not a cause for concern. ?What side effects may I notice from receiving this medication? ?Side effects that you should report to your care team as soon as possible: ?Allergic reactions--skin rash, itching, hives, swelling of the face, lips, tongue, or throat ?Increase in blood pressure ?Mood and behavior changes--anxiety, nervousness, confusion, hallucinations, irritability, hostility, thoughts of suicide or self-harm, worsening mood, feelings of depression ?Redness, blistering, peeling, or loosening of the skin, including inside the mouth ?Seizures ?Sudden eye pain or change in vision such as blurry vision, seeing halos around lights, vision loss ?Side effects that usually do not require medical attention (report to your care team if they continue or are bothersome): ?Constipation ?Dizziness ?Dry mouth ?Loss of appetite ?Nausea ?Tremors or shaking ?Trouble sleeping ?This list may not describe all possible side effects. Call your doctor for medical advice about side effects. You may report side effects to FDA at 1-800-FDA-1088. ?Where should I keep my medication? ?Keep out of the reach of children and pets. ?Store at room temperature between 15 and 30  degrees C (59 and 86 degrees F). Throw away any unused medication after the expiration date. ?NOTE: This sheet is a summary. It may not cover all possible information. If you have questions about this medicine, talk to your doctor, pharmacist, or  health care provider. ?? 2022 Elsevier/Gold Standard (2020-09-13 00:00:00) ? ?

## 2021-10-12 NOTE — Progress Notes (Addendum)
?MEDICARE ANNUAL WELLNESS VISIT AND 3  MONTH FOLLOW UP ? ?Assessment:  ? ?Diagnoses and all orders for this visit: ? ?Annual Medicare Wellness Visit ?Due annually  ?Health maintenance reviewed ? ?Atherosclerosis of aorta ([place]) - per CXR 05/12/2017 ?Control blood pressure, cholesterol, glucose, increase exercise.  ? ?Essential hypertension ?Continue current medications ?Monitor blood pressure at home; call if consistently over 130/80 ?Continue DASH diet.   ?Reminder to go to the ER if any CP, SOB, nausea, dizziness, severe HA, changes vision/speech, left arm numbness and tingling and jaw pain. ? ?Hyperlipidemia associated with type 2 diabetes mellitus ([place]) ?Aortic atherosclerosis ?Continue medications ?LDL goal <70 ?Discussed dietary and exercise modifications ?Low saturated fat diet ?Lipid panel ? ?Type 2 diabetes mellitus with stage 2 chronic kidney disease, without long-term current use of insulin ([place]) ?Controlled with diet and exercise ?Discussed general issues about diabetes pathophysiology and management. ?Education: Reviewed ?ABCs? of diabetes management (respective goals in parentheses):  A1C (<7), blood pressure (<130/80), and cholesterol (LDL <70) ?Dietary recommendations ?Encouraged aerobic exercise.  ?Discussed foot care, check daily ?Yearly retinal exam ?Dental exam every 6 months ?- newly on trulicity 4.5 mg/week, full benefit evaluation in 3 months.  ? ?Vitamin D deficiency ?Continue supplementation ? ?Gastroesophageal reflux disease without esophagitis ?Well managed on current medications ?Discussed diet, avoiding triggers and other lifestyle changes ? ?CKD stage 2 due to type 2 diabetes mellitus ([place]) ?Increase fluids, avoid NSAIDS, monitor sugars, will monitor ?Continue ARB ? ?Chronic obstructive pulmonary disease, unspecified COPD type ([place]) ?Former smoker, COPD per imaging ?Having some mild dyspnea/tightness, nocturnal, anxiety/stress vs resp; will try brezti sample, she will follow up if  benefit ? ?Overweight (BMI 25.0-29.9) ?Discussed dietary and exercise modifications ? ?Medication management ?Continued ? ?Bilateral hand pain ?OTC diclofenac gel ?May use Meloxicam 3-4times a week PRN ? ?Situational stress/depression ?- try wellbutrin daily for mood/energy- follow up in 1 month with progress ?Stress management techniques discussed, increase water, good sleep hygiene discussed, increase exercise, and increase veggies.  ? ? ?Orders Placed This Encounter  ?Procedures  ? CBC with Differential/Platelet  ? COMPLETE METABOLIC PANEL WITH GFR  ? Magnesium  ? Lipid panel  ? TSH  ? Hemoglobin A1c  ? ? ? ?Further disposition pending results if labs check today. Discussed med's effects and SE's.   ?Over 30 minutes of face to face interview, exam, counseling, chart review, and critical decision making was performed.  ? ? ?Future Appointments  ?Date Time Provider [place]  ?01/01/2022  9:00 AM Newton Pigg, RPH [facility] None  ?05/06/2022 10:00 AM Unk Pinto, MD [facility] None  ?10/16/2022 10:00 AM Liane Comber, NP [facility] None  ? ? ? ?Plan:  ? ?During the course of the visit the patient was educated and counseled about appropriate screening and preventive services including:  ? ?Pneumococcal vaccine  ?Prevnar 13 ?Influenza vaccine ?Td vaccine ?Screening electrocardiogram ?Bone densitometry screening ?Colorectal cancer screening ?Diabetes screening ?Glaucoma screening ?Nutrition counseling  ?Advanced directives: requested ? ? ?Subjective:  ?Debra Collins is a 86 y.o. female who presents for Medicare Annual Wellness Visit and 3 month follow up. She has Essential hypertension; Vitamin D deficiency; Medication management; Type 2 diabetes mellitus ([place]); Encounter for Medicare annual wellness exam; Chronic obstructive pulmonary disease ([place]); Overweight (BMI 25.0-29.9); Gastroesophageal reflux disease without esophagitis; Unsteady gait; CKD stage 2 due to type 2 diabetes mellitus ([place]);  Hyperlipidemia associated with type 2 diabetes mellitus ([place]); Aortic atherosclerosis ([place]) by CXR 05/12/2017; Paresthesia and pain of both upper extremities; and Situational  depression on their problem list. She has remote hx of bil mastectomy in 1985 for severe fibrocystic breast disease.  ? ?She reports frustration with husband with dementia, getting worse. Cannot go travel with him, she is primary caregiver. Poor motivation and energy.  ? ?She uses meloxicam/voltaren gel for joint pains, well controlled.  ? ?GERD well controlled with omeprazole after failure of famotidine.  ? ?BMI is Body mass index is 27.99 kg/m?., she has not been working on diet and exercise, but generally active around home/garden and full time caregiver for her husband.  ?Wt Readings from Last 3 Encounters:  ?10/12/21 158 lb (71.7 kg)  ?06/11/21 157 lb (71.2 kg)  ?05/15/21 162 lb 3.2 oz (73.6 kg)  ? ?Her blood pressure has been controlled at home, today their BP is BP: 136/86  ? She does not workout. She denies chest pain, shortness of breath, dizziness. Occasionally in the evenings she describes "hard to take a deep breath" "can't get a breath in. "  ? ?She is a remote former smoker, COPD per CXR in 2018. Denies cough/productivity, exertional sx. Reports occasional wheeze.  ? ?Aortic atherosclerosis per CXR 05/12/2017.  ? ? She is on cholesterol medication (atorvastatin 80 mg taking half pill and denies myalgias. Her cholesterol is not at goal. The cholesterol last visit was:    ?Lab Results  ?Component Value Date  ? CHOL 134 05/04/2021  ? HDL 35 (L) 05/04/2021  ? [place] 75 05/04/2021  ? TRIG 165 (H) 05/04/2021  ? CHOLHDL 3.8 05/04/2021  ? ? She has been working on diet and exercise for diabetes with CKD (controlled in prediabetes range with meds, didn't like metformin made her feel, just recently increased trulicity to 4.5 mg weekly and tolerating well), she is on bASA, she is on ACE, and denies paresthesia of the feet, polydipsia and  polyuria. Last A1C in the office was:  ?Lab Results  ?Component Value Date  ? HGBA1C 6.3 (H) 05/04/2021  ? ?Lab Results  ?Component Value Date  ? EGFR 84 05/04/2021  ? ?Lab Results  ?Component Value Date  ? MICRALBCREAT NOTE 05/04/2021  ? ?She did change her levothyroxine dose, taking 1 tab 50 mcg daily.  ?Lab Results  ?Component Value Date  ? TSH 4.57 (H) 06/11/2021  ? ?Patient is on Vitamin D supplement - 5000 units daily, but forgets to take   ?Lab Results  ?Component Value Date  ? VD25OH 79 05/04/2021  ?   ? ? ?Medication Review:  ?Current Outpatient Medications on File Prior to Visit  ?Medication Sig Dispense Refill  ? aspirin 81 MG tablet Take 81 mg by mouth daily.    ? atenolol (TENORMIN) 100 MG tablet Take 1 tablet Daily for BP 90 tablet 3  ? CALCIUM-MAGNESIUM-VITAMIN D PO Take 1 tablet by mouth daily. Contains Vitamin 6300 IU    ? Dulaglutide (TRULICITY) 4.5 LG/9.2JJ SOPN Inject 4.5 mg into skin each week for Diabetes 2 mL 2  ? levothyroxine (SYNTHROID) 50 MCG tablet Take 1 tablet daily on an empty stomach with only water for 30 minutes & no Antacid meds, Calcium or Magnesium for 4 hours & avoid Biotin 90 tablet 1  ? meloxicam (MOBIC) 15 MG tablet Take 1/2 to 1 tablet daily with food for Pain / Inflammation & Limit to 5 days/week to prevent Kidney Damage 90 tablet 1  ? Multiple Vitamin (MULTIVITAMIN) tablet Take 1 tablet by mouth daily.    ? olmesartan (BENICAR) 40 MG tablet Take 40 mg by  mouth daily.    ? omeprazole (PRILOSEC) 20 MG capsule Take 1 capsule by mouth  every day for GERD 90 capsule 1  ? verapamil (CALAN) 120 MG tablet Take 1 tablet 2 x /day for BP 180 tablet 3  ? vitamin B-12 (CYANOCOBALAMIN) 1000 MCG tablet Take 1,000 mcg by mouth daily.    ? ?No current facility-administered medications on file prior to visit.  ? ? ?Current Problems (verified) ?Patient Active Problem List  ? Diagnosis Date Noted  ? Situational depression 10/12/2021  ? Paresthesia and pain of both upper extremities 01/30/2021   ? Aortic atherosclerosis ([place]) by CXR 05/12/2017 07/11/2020  ? Hyperlipidemia associated with type 2 diabetes mellitus ([place]) 06/22/2019  ? CKD stage 2 due to type 2 diabetes mellitus ([place]) 03/15/2019  ? Unsteady

## 2021-10-13 LAB — COMPLETE METABOLIC PANEL WITH GFR
AG Ratio: 2.3 (calc) (ref 1.0–2.5)
ALT: 19 U/L (ref 6–29)
AST: 24 U/L (ref 10–35)
Albumin: 4.6 g/dL (ref 3.6–5.1)
Alkaline phosphatase (APISO): 48 U/L (ref 37–153)
BUN: 23 mg/dL (ref 7–25)
CO2: 27 mmol/L (ref 20–32)
Calcium: 9.4 mg/dL (ref 8.6–10.4)
Chloride: 105 mmol/L (ref 98–110)
Creat: 0.63 mg/dL (ref 0.60–0.95)
Globulin: 2 g/dL (calc) (ref 1.9–3.7)
Glucose, Bld: 88 mg/dL (ref 65–99)
Potassium: 4.2 mmol/L (ref 3.5–5.3)
Sodium: 140 mmol/L (ref 135–146)
Total Bilirubin: 1.3 mg/dL — ABNORMAL HIGH (ref 0.2–1.2)
Total Protein: 6.6 g/dL (ref 6.1–8.1)
eGFR: 88 mL/min/{1.73_m2} (ref >=60)

## 2021-10-13 LAB — LIPID PANEL
Cholesterol: 136 mg/dL (ref <200)
HDL: 33 mg/dL — ABNORMAL LOW (ref >=50)
LDL Cholesterol (Calc): 77 mg/dL (calc)
Non-HDL Cholesterol (Calc): 103 mg/dL (calc) (ref <130)
Total CHOL/HDL Ratio: 4.1 (calc) (ref <5.0)
Triglycerides: 159 mg/dL — ABNORMAL HIGH (ref <150)

## 2021-10-13 LAB — CBC WITH DIFFERENTIAL/PLATELET
Absolute Monocytes: 551 cells/uL (ref 200–950)
Basophils Absolute: 32 cells/uL (ref 0–200)
Basophils Relative: 0.6 %
Eosinophils Absolute: 130 cells/uL (ref 15–500)
Eosinophils Relative: 2.4 %
HCT: 44.8 % (ref 35.0–45.0)
Hemoglobin: 15.1 g/dL (ref 11.7–15.5)
Lymphs Abs: 1415 cells/uL (ref 850–3900)
MCH: 32.9 pg (ref 27.0–33.0)
MCHC: 33.7 g/dL (ref 32.0–36.0)
MCV: 97.6 fL (ref 80.0–100.0)
MPV: 11.4 fL (ref 7.5–12.5)
Monocytes Relative: 10.2 %
Neutro Abs: 3272 cells/uL (ref 1500–7800)
Neutrophils Relative %: 60.6 %
Platelets: 147 10*3/uL (ref 140–400)
RBC: 4.59 10*6/uL (ref 3.80–5.10)
RDW: 13.1 % (ref 11.0–15.0)
Total Lymphocyte: 26.2 %
WBC: 5.4 10*3/uL (ref 3.8–10.8)

## 2021-10-13 LAB — HEMOGLOBIN A1C
Hgb A1c MFr Bld: 6.4 % of total Hgb — ABNORMAL HIGH (ref <5.7)
Mean Plasma Glucose: 137 mg/dL
eAG (mmol/L): 7.6 mmol/L

## 2021-10-13 LAB — MAGNESIUM: Magnesium: 2.1 mg/dL (ref 1.5–2.5)

## 2021-10-13 LAB — TSH: TSH: 1.2 mIU/L (ref 0.40–4.50)

## 2021-11-07 ENCOUNTER — Encounter: Payer: Self-pay | Admitting: Adult Health

## 2021-11-08 ENCOUNTER — Other Ambulatory Visit: Payer: Self-pay | Admitting: Internal Medicine

## 2021-11-08 DIAGNOSIS — I10 Essential (primary) hypertension: Secondary | ICD-10-CM

## 2021-11-10 ENCOUNTER — Other Ambulatory Visit: Payer: Self-pay | Admitting: Adult Health

## 2021-11-12 ENCOUNTER — Other Ambulatory Visit: Payer: Self-pay

## 2021-11-12 DIAGNOSIS — H26493 Other secondary cataract, bilateral: Secondary | ICD-10-CM | POA: Diagnosis not present

## 2021-11-12 DIAGNOSIS — Z961 Presence of intraocular lens: Secondary | ICD-10-CM | POA: Diagnosis not present

## 2021-11-12 MED ORDER — OLMESARTAN MEDOXOMIL 40 MG PO TABS: 40.0000 mg | ORAL_TABLET | Freq: Every day | ORAL | 1 refills | Status: DC

## 2021-11-19 ENCOUNTER — Encounter: Payer: Self-pay | Admitting: Nurse Practitioner

## 2021-11-19 ENCOUNTER — Ambulatory Visit (INDEPENDENT_AMBULATORY_CARE_PROVIDER_SITE_OTHER): Payer: PPO | Admitting: Nurse Practitioner

## 2021-11-19 VITALS — BP 124/80 | HR 67 | Temp 97.9°F | Resp 16 | Ht 63.0 in | Wt 156.8 lb

## 2021-11-19 DIAGNOSIS — R229 Localized swelling, mass and lump, unspecified: Secondary | ICD-10-CM | POA: Diagnosis not present

## 2021-11-19 DIAGNOSIS — N6011 Diffuse cystic mastopathy of right breast: Secondary | ICD-10-CM | POA: Diagnosis not present

## 2021-11-19 NOTE — Progress Notes (Signed)
Assessment and Plan: ? ?Debra Collins was seen today for an episodic visit . ? ?Diagnoses and all order for this visit: ? ?1. Localized skin mass, lump, or swelling ? ?- US BREAST LTD UNI RIGHT INC AXILLA; Future ? ?2. Fibrocystic changes of right breast ? ?- US BREAST LTD UNI RIGHT INC AXILLA; Future ? ?Notify office for further evaluation and treatment, questions or concerns if s/s fail to improve. The risks and benefits of my recommendations, as well as other treatment options were discussed with the patient today. Questions were answered. ? ?Further disposition pending results of labs. Discussed med's effects and SE's.   ? ?Over 15 minutes of exam, counseling, chart review, and critical decision making was performed.  ? ?Future Appointments  ?Date Time Provider [place]  ?01/01/2022  9:00 AM [place], Osa Craver, RPH [facility] None  ?01/23/2022 10:30 AM Liane Comber, NP [facility] None  ?05/06/2022 10:00 AM Unk Pinto, MD [facility] None  ?10/16/2022 10:00 AM Liane Comber, NP [facility] None  ? ? ?------------------------------------------------------------------------------------------------------------------ ? ? ?HPI ?BP 124/80   Pulse 67   Temp 97.9 ?F (36.6 ?C)   Resp 16   Ht '5\' 3"'$  (1.6 m)   Wt 156 lb 12.8 oz (71.1 kg)   SpO2 96%   BMI 27.78 kg/m?  ? ?86 y.o.female presents with concern for right breast nodule.  Unknown exactly how long the area has been present, however, she has just noticed over the last week that the area has become larger in size.  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 She pushes and pulls "a lot."  She has some left axilla tenderness that she states could be from using her muscles to move her husband, however, the area of concern on the right breast is not sore or tender.  Has hx of fibrocystic breasts.  Had bilateral mastectomy 40 years ago.  Last mammogram 2003. Thinks she may have been on HRT in the past during menopause, however, her recall can not be  confirmed.  Denies pain.  Denies any new medications other than Trulicity started 07/270.   ? ?Past Medical History:  ?Diagnosis Date  ? DDD (degenerative disc disease), lumbar   ? Fatty liver disease, nonalcoholic   ? CT AB 2012  ? Fibrocystic breast disease   ? Hyperlipidemia   ? Hypertension   ? Type II or unspecified type diabetes mellitus without mention of complication, not stated as uncontrolled   ? Vitamin D deficiency   ?  ? ?Allergies  ?Allergen Reactions  ? Diltiazem   ? ? ?Current Outpatient Medications on File Prior to Visit  ?Medication Sig  ? aspirin 81 MG tablet Take 81 mg by mouth daily.  ? atenolol (TENORMIN) 100 MG tablet Take 1 tablet Daily for BP  ? atorvastatin (LIPITOR) 40 MG tablet Take 1 tablet daily as directed for cholesterol.  ? CALCIUM-MAGNESIUM-VITAMIN D PO Take 1 tablet by mouth daily. Contains Vitamin 6300 IU  ? Dulaglutide (TRULICITY) 4.5 ZD/6.6YQ SOPN Inject 4.5 mg into skin each week for Diabetes  ? levothyroxine (SYNTHROID) 50 MCG tablet Take 1 tablet daily on an empty stomach with only water for 30 minutes & no Antacid meds, Calcium or Magnesium for 4 hours & avoid Biotin  ? meloxicam (MOBIC) 15 MG tablet Take 1/2 to 1 tablet daily with food for Pain / Inflammation & Limit to 5 days/week to prevent Kidney Damage  ? Multiple Vitamin (MULTIVITAMIN) tablet Take 1 tablet by mouth daily.  ?  olmesartan (BENICAR) 40 MG tablet Take 1 tablet (40 mg total) by mouth daily.  ? omeprazole (PRILOSEC) 20 MG capsule Take 1 capsule by mouth  every day for GERD  ? verapamil (CALAN) 120 MG tablet TAKE ONE TABLET BY MOUTH TWICE A DAY FOR BLOOD PRESSURE  ? vitamin B-12 (CYANOCOBALAMIN) 1000 MCG tablet Take 1,000 mcg by mouth daily.  ? buPROPion (WELLBUTRIN XL) 150 MG 24 hr tablet TAKE ONE TABLET BY MOUTH EVERY MORNING (Patient not taking: Reported on 11/19/2021)  ? ?No current facility-administered medications on file prior to visit.  ? ? ?ROS: all negative except what is noted in the HPI.  ? ?Physical  Exam: ? ?BP 124/80   Pulse 67   Temp 97.9 ?F (36.6 ?C)   Resp 16   Ht '5\' 3"'$  (1.6 m)   Wt 156 lb 12.8 oz (71.1 kg)   SpO2 96%   BMI 27.78 kg/m?  ? ?General Appearance: NAD.  Awake, conversant and cooperative. ?Eyes: PERRLA, EOMs intact.  Sclera white.  Conjunctiva without erythema. ?Sinuses: No frontal/maxillary tenderness.  No nasal discharge. Nares patent.  ?ENT/Mouth: Ext aud canals clear.  Bilateral TMs w/DOL and without erythema or bulging. Hearing intact.  Posterior pharynx without swelling or exudate.  Tonsils without swelling or erythema.  ?Neck: Supple.  No masses, nodules or thyromegaly. ?Respiratory: Effort is regular with non-labored breathing. Breath sounds are equal bilaterally without rales, rhonchi, wheezing or stridor.  ?Cardio: RRR with no MRGs. Brisk peripheral pulses without edema.  ?Abdomen: Active BS in all four quadrants.  Soft and non-tender without guarding, rebound tenderness, hernias or masses. ?Lymphatics: Non tender without lymphadenopathy.  ?Musculoskeletal: Full ROM, 5/5 strength, normal ambulation.  No clubbing or cyanosis. ?Skin: Appropriate color for ethnicity. Warm without rashes, lesions, ecchymosis, ulcers.  ?Neuro: CN II-XII grossly normal. Normal muscle tone without cerebellar symptoms and intact sensation.   ?Psych: AO X 3,  appropriate mood and affect, insight and judgment.  ?Breast:  Right upper beast with approximately 4 cm round slightly moveable firm, non painful nodule.   ?  ? ?Darrol Jump, NP ?3:33 PM ?[facility] ? ?

## 2021-11-20 ENCOUNTER — Ambulatory Visit: Payer: PPO | Admitting: Nurse Practitioner

## 2021-11-21 DIAGNOSIS — H26491 Other secondary cataract, right eye: Secondary | ICD-10-CM | POA: Diagnosis not present

## 2021-11-28 DIAGNOSIS — H26492 Other secondary cataract, left eye: Secondary | ICD-10-CM | POA: Diagnosis not present

## 2021-11-29 ENCOUNTER — Ambulatory Visit
Admission: RE | Admit: 2021-11-29 | Discharge: 2021-11-29 | Disposition: A | Discharge status: Home / Self Care | Payer: PPO | Source: Ambulatory Visit | Attending: Nurse Practitioner | Admitting: Nurse Practitioner

## 2021-11-29 ENCOUNTER — Other Ambulatory Visit: Payer: Self-pay | Admitting: Nurse Practitioner

## 2021-11-29 DIAGNOSIS — R229 Localized swelling, mass and lump, unspecified: Secondary | ICD-10-CM

## 2021-11-29 DIAGNOSIS — N6489 Other specified disorders of breast: Secondary | ICD-10-CM | POA: Diagnosis not present

## 2021-11-29 DIAGNOSIS — N6011 Diffuse cystic mastopathy of right breast: Secondary | ICD-10-CM

## 2021-11-29 DIAGNOSIS — R928 Other abnormal and inconclusive findings on diagnostic imaging of breast: Secondary | ICD-10-CM | POA: Diagnosis not present

## 2021-11-30 ENCOUNTER — Encounter: Payer: Self-pay | Admitting: Nurse Practitioner

## 2021-12-03 ENCOUNTER — Other Ambulatory Visit: Payer: Self-pay | Admitting: Nurse Practitioner

## 2021-12-04 ENCOUNTER — Other Ambulatory Visit: Payer: Self-pay | Admitting: Nurse Practitioner

## 2021-12-04 DIAGNOSIS — R229 Localized swelling, mass and lump, unspecified: Secondary | ICD-10-CM

## 2021-12-17 ENCOUNTER — Ambulatory Visit (HOSPITAL_COMMUNITY)
Admission: RE | Admit: 2021-12-17 | Discharge: 2021-12-17 | Disposition: A | Discharge status: Home / Self Care | Payer: PPO | Source: Ambulatory Visit | Attending: Nurse Practitioner | Admitting: Nurse Practitioner

## 2021-12-17 DIAGNOSIS — R229 Localized swelling, mass and lump, unspecified: Secondary | ICD-10-CM | POA: Diagnosis not present

## 2021-12-17 MED ORDER — GADOBUTROL 1 MMOL/ML IV SOLN
7.0000 mL | Freq: Once | INTRAVENOUS | Status: AC | PRN
  Administered 2021-12-17: 7 mL via INTRAVENOUS

## 2021-12-17 NOTE — Progress Notes (Unsigned)
Assessment and Plan:  There are no diagnoses linked to this encounter.    Further disposition pending results of labs. Discussed med's effects and SE's.   Over 30 minutes of exam, counseling, chart review, and critical decision making was performed.   Future Appointments  Date Time Provider [place]  12/18/2021 10:00 AM Magda Bernheim, NP [facility] None  01/01/2022  9:00 AM Carleene Mains, RPH [facility] None  01/23/2022 10:30 AM Liane Comber, NP [facility] None  05/06/2022 10:00 AM Unk Pinto, MD [facility] None  10/16/2022 10:00 AM Liane Comber, NP [facility] None    ------------------------------------------------------------------------------------------------------------------   HPI There were no vitals taken for this visit. 86 y.o.female presents for  Past Medical History:  Diagnosis Date   DDD (degenerative disc disease), lumbar    Fatty liver disease, nonalcoholic    CT AB 6195   Fibrocystic breast disease    Hyperlipidemia    Hypertension    Type II or unspecified type diabetes mellitus without mention of complication, not stated as uncontrolled    Vitamin D deficiency      Allergies  Allergen Reactions   Diltiazem     Current Outpatient Medications on File Prior to Visit  Medication Sig   aspirin 81 MG tablet Take 81 mg by mouth daily.   atenolol (TENORMIN) 100 MG tablet Take 1 tablet Daily for BP   atorvastatin (LIPITOR) 40 MG tablet Take 1 tablet daily as directed for cholesterol.   buPROPion (WELLBUTRIN XL) 150 MG 24 hr tablet TAKE ONE TABLET BY MOUTH EVERY MORNING (Patient not taking: Reported on 11/19/2021)   CALCIUM-MAGNESIUM-VITAMIN D PO Take 1 tablet by mouth daily. Contains Vitamin 6300 IU   Dulaglutide (TRULICITY) 4.5 KD/3.2IZ SOPN Inject 4.5 mg into skin each week for Diabetes   levothyroxine (SYNTHROID) 50 MCG tablet Take 1 tablet daily on an empty stomach with only water for 30 minutes & no Antacid meds, Calcium or Magnesium for 4  hours & avoid Biotin   meloxicam (MOBIC) 15 MG tablet Take 1/2 to 1 tablet daily with food for Pain / Inflammation & Limit to 5 days/week to prevent Kidney Damage   Multiple Vitamin (MULTIVITAMIN) tablet Take 1 tablet by mouth daily.   olmesartan (BENICAR) 40 MG tablet Take 1 tablet (40 mg total) by mouth daily.   omeprazole (PRILOSEC) 20 MG capsule Take 1 capsule by mouth  every day for GERD   verapamil (CALAN) 120 MG tablet TAKE ONE TABLET BY MOUTH TWICE A DAY FOR BLOOD PRESSURE   vitamin B-12 (CYANOCOBALAMIN) 1000 MCG tablet Take 1,000 mcg by mouth daily.   No current facility-administered medications on file prior to visit.    ROS: all negative except above.   Physical Exam:  There were no vitals taken for this visit.  General Appearance: Well nourished, in no apparent distress. Eyes: PERRLA, EOMs, conjunctiva no swelling or erythema Sinuses: No Frontal/maxillary tenderness ENT/Mouth: Ext aud canals clear, TMs without erythema, bulging. No erythema, swelling, or exudate on post pharynx.  Tonsils not swollen or erythematous. Hearing normal.  Neck: Supple, thyroid normal.  Respiratory: Respiratory effort normal, BS equal bilaterally without rales, rhonchi, wheezing or stridor.  Cardio: RRR with no MRGs. Brisk peripheral pulses without edema.  Abdomen: Soft, + BS.  Non tender, no guarding, rebound, hernias, masses. Lymphatics: Non tender without lymphadenopathy.  Musculoskeletal: Full ROM, 5/5 strength, normal gait.  Skin: Warm, dry without rashes, lesions, ecchymosis.  Neuro: Cranial nerves intact. Normal muscle tone, no cerebellar symptoms. Sensation intact.  Psych: Awake and oriented X 3, normal affect, Insight and Judgment appropriate.     Magda Bernheim, NP 12:28 PM [facility]

## 2021-12-18 ENCOUNTER — Encounter: Payer: Self-pay | Admitting: Nurse Practitioner

## 2021-12-18 ENCOUNTER — Ambulatory Visit (INDEPENDENT_AMBULATORY_CARE_PROVIDER_SITE_OTHER): Payer: PPO | Admitting: Nurse Practitioner

## 2021-12-18 VITALS — BP 124/60 | HR 42 | Temp 97.5°F | Wt 155.2 lb

## 2021-12-18 DIAGNOSIS — H6502 Acute serous otitis media, left ear: Secondary | ICD-10-CM | POA: Diagnosis not present

## 2021-12-18 DIAGNOSIS — R928 Other abnormal and inconclusive findings on diagnostic imaging of breast: Secondary | ICD-10-CM

## 2021-12-18 MED ORDER — DEXAMETHASONE 1 MG PO TABS: ORAL_TABLET | ORAL | 0 refills | Status: DC

## 2021-12-18 MED ORDER — AZITHROMYCIN 250 MG PO TABS: ORAL_TABLET | ORAL | 1 refills | Status: DC

## 2021-12-23 ENCOUNTER — Ambulatory Visit (HOSPITAL_COMMUNITY)
Admission: RE | Admit: 2021-12-23 | Discharge: 2021-12-23 | Disposition: A | Discharge status: Home / Self Care | Payer: PPO | Source: Ambulatory Visit | Attending: Nurse Practitioner | Admitting: Nurse Practitioner

## 2021-12-23 ENCOUNTER — Other Ambulatory Visit: Payer: Self-pay | Admitting: Nurse Practitioner

## 2021-12-23 ENCOUNTER — Other Ambulatory Visit: Payer: Self-pay | Admitting: Internal Medicine

## 2021-12-23 DIAGNOSIS — R229 Localized swelling, mass and lump, unspecified: Secondary | ICD-10-CM

## 2021-12-23 DIAGNOSIS — E039 Hypothyroidism, unspecified: Secondary | ICD-10-CM

## 2021-12-23 DIAGNOSIS — N6310 Unspecified lump in the right breast, unspecified quadrant: Secondary | ICD-10-CM | POA: Diagnosis not present

## 2021-12-23 DIAGNOSIS — Z9882 Breast implant status: Secondary | ICD-10-CM | POA: Diagnosis not present

## 2021-12-23 MED ORDER — LEVOTHYROXINE SODIUM 50 MCG PO TABS: ORAL_TABLET | ORAL | 3 refills | Status: DC

## 2021-12-24 MED ORDER — GADOBUTROL 1 MMOL/ML IV SOLN
7.0000 mL | Freq: Once | INTRAVENOUS | Status: AC | PRN
Start: 2021-12-23 — End: 2021-12-23
  Administered 2021-12-23: 7 mL via INTRAVENOUS

## 2021-12-25 ENCOUNTER — Encounter: Payer: Self-pay | Admitting: Nurse Practitioner

## 2021-12-26 ENCOUNTER — Other Ambulatory Visit: Payer: Self-pay | Admitting: Nurse Practitioner

## 2021-12-26 DIAGNOSIS — N6489 Other specified disorders of breast: Secondary | ICD-10-CM

## 2021-12-28 ENCOUNTER — Telehealth: Payer: Self-pay

## 2021-12-28 NOTE — Telephone Encounter (Signed)
LM-12/28/21-Called pt. And confirmed CP visit for 6/19 at 9:00AM and completed precall questions.  Total time spent: 5 min.

## 2022-01-01 ENCOUNTER — Telehealth: Payer: Self-pay

## 2022-01-01 ENCOUNTER — Telehealth: Payer: PPO | Admitting: Pharmacy Technician

## 2022-01-01 ENCOUNTER — Other Ambulatory Visit: Payer: Self-pay | Admitting: Internal Medicine

## 2022-01-01 DIAGNOSIS — E039 Hypothyroidism, unspecified: Secondary | ICD-10-CM | POA: Insufficient documentation

## 2022-01-01 MED ORDER — LEVOTHYROXINE SODIUM 50 MCG PO TABS: ORAL_TABLET | ORAL | 3 refills | Status: DC

## 2022-01-01 NOTE — Telephone Encounter (Signed)
LM-01/01/22-Pt. Called and left VM while I was on the phone w/ another pt. Called pt. Back and transferred pt. To CP for appointment.   Total time spent:4 min.

## 2022-01-03 NOTE — Telephone Encounter (Signed)
LM-01/03/22-Calling pt. To schedule CP f/u visit. R/s visit to 8/15 at 9:00AM.  Total time spent: 4 min.

## 2022-01-11 ENCOUNTER — Other Ambulatory Visit: Payer: Self-pay | Admitting: Internal Medicine

## 2022-01-22 NOTE — Progress Notes (Unsigned)
3  MONTH FOLLOW UP  Assessment:   Diagnoses and all orders for this visit:  Atherosclerosis of aorta ([place]) - per CXR 05/12/2017 Control blood pressure, cholesterol, glucose, increase exercise.   Essential hypertension Continue current medications Monitor blood pressure at home; call if consistently over 130/80 Continue DASH diet.   Reminder to go to the ER if any CP, SOB, nausea, dizziness, severe HA, changes vision/speech, left arm numbness and tingling and jaw pain.  Hyperlipidemia associated with type 2 diabetes mellitus (HCC) Continue medications LDL goal <70 Discussed dietary and exercise modifications Low saturated fat diet Lipid panel  Type 2 diabetes mellitus with stage 2 chronic kidney disease, without long-term current use of insulin ([place]) Controlled with diet and exercise Discussed general issues about diabetes pathophysiology and management. Education: Reviewed 'ABCs' of diabetes management (respective goals in parentheses):  A1C (<7), blood pressure (<130/80), and cholesterol (LDL <70) Dietary recommendations Encouraged aerobic exercise.  Discussed foot care, check daily Yearly retinal exam Dental exam every 6 months - on trulicity 4.5 mg/week, tolerating, refilled with 90 day supply  CKD stage 2 due to type 2 diabetes mellitus (HCC) Increase fluids, avoid NSAIDS, monitor sugars, will monitor Continue ARB  Vitamin D deficiency Continue supplementation  Gastroesophageal reflux disease without esophagitis Well managed on current medications Discussed diet, avoiding triggers and other lifestyle changes  Chronic obstructive pulmonary disease, unspecified COPD type ([place]) Former smoker, COPD per imaging Having some mild dyspnea/tightness, nocturnal, anxiety/stress vs resp; will try brezti sample, she will follow up if benefit  Overweight (BMI 25.0-29.9)  Discussed dietary and exercise modifications  Medication  management Continued  Hypothyroidism Currently off of med, ran out 3 weeks ago Check TSH, restart levothyroxine if indicated by labs Denies notable sx, was very mild deviation when med started in 2022 check TSH level   Situational stress/depression Had stopped wellbutrin, after discussion she plans to restart, continue at least 8 weeks to determine full benefit Stress management techniques discussed, increase water, good sleep hygiene discussed, increase exercise, and increase veggies.    Orders Placed This Encounter  Procedures   CBC with Differential/Platelet   COMPLETE METABOLIC PANEL WITH GFR   Magnesium   Lipid panel   TSH   Hemoglobin A1c    Further disposition pending results if labs check today. Discussed med's effects and SE's.   Over 30 minutes of face to face interview, exam, counseling, chart review, and critical decision making was performed.    Future Appointments  Date Time Provider [place]  02/26/2022  9:00 AM Carleene Mains, [place] [facility] None  05/06/2022 10:00 AM Unk Pinto, MD [facility] None  10/16/2022 10:00 AM Darrol Jump, NP [facility] None    Subjective:  Debra Collins is a 86 y.o. female who presents for 3 month follow up for htn, T2DM, hld, CKD, vit D def, hypothyroid, weight, vit D def and depression.   She reports frustration with husband with dementia, getting worse. Cannot go travel with him, she is primary caregiver. Poor motivation and energy.  Started on wellbutrin 150 mg XR, didn't have any problems  She uses meloxicam/voltaren gel for joint pains, well controlled.   GERD well controlled with omeprazole after failure of famotidine.   BMI is Body mass index is 27.46 kg/m., she has not been working on diet and exercise, but generally active around home/garden and full time caregiver for her husband.  Wt Readings from Last 3 Encounters:  01/23/22 155 lb (70.3 kg)  12/18/21 155 lb 3.2 oz (  70.4 kg)  11/19/21 156 lb  12.8 oz (71.1 kg)   Her blood pressure has been controlled at home, today their BP is BP: 130/80   She does not workout. She denies chest pain, shortness of breath, dizziness.   She is a remote former smoker, COPD per CXR in 2018. Denies cough/productivity, exertional sx. Reports occasional wheeze.   Aortic atherosclerosis per CXR 05/12/2017.    She is on cholesterol medication (atorvastatin 40 mg daily) and denies myalgias. Her cholesterol is not at goal. The cholesterol last visit was:    Lab Results  Component Value Date   CHOL 136 10/12/2021   HDL 33 (L) 10/12/2021   LDLCALC 77 10/12/2021   TRIG 159 (H) 10/12/2021   CHOLHDL 4.1 10/12/2021    She has been working on diet and exercise for diabetes with CKD (controlled in prediabetes range with meds, didn't like metformin made her feel, increased trulicity to 4.5 mg weekly and tolerating well), she is on bASA, she is on ACE, and denies paresthesia of the feet, polydipsia and polyuria. Doesn't check glucose, dislikes doing it, has been well controlled recently. Last A1C in the office was:  Lab Results  Component Value Date   HGBA1C 6.4 (H) 10/12/2021   Lab Results  Component Value Date   EGFR 88 10/12/2021   EGFR 84 05/04/2021   Lab Results  Component Value Date   MICRALBCREAT NOTE 05/04/2021   She has been on levothyroxine since 05/2021, was taking 1 tab 50 mcg daily, but admits ran out a few weeks ago, hasn't been taking. She endorses fatigue but consistent with baseline, denies hair loss, cold intolerance, constipation.  Lab Results  Component Value Date   TSH 1.20 10/12/2021   Patient is on Vitamin D supplement - 5000 units daily Lab Results  Component Value Date   VD25OH 79 05/04/2021       Medication Review:  Current Outpatient Medications on File Prior to Visit  Medication Sig Dispense Refill   aspirin 81 MG tablet Take 81 mg by mouth daily.     atenolol (TENORMIN) 100 MG tablet Take 1 tablet Daily for BP 90  tablet 3   atorvastatin (LIPITOR) 40 MG tablet Take 1 tablet daily as directed for cholesterol. 90 tablet 3   CALCIUM-MAGNESIUM-VITAMIN D PO Take 1 tablet by mouth daily. Contains Vitamin 6300 IU     levothyroxine (SYNTHROID) 50 MCG tablet Take 1 tablet daily on an empty stomach with only water for 30 minutes & no Antacid meds, Calcium or Magnesium for 4 hours & avoid Biotin 90 tablet 3   Multiple Vitamin (MULTIVITAMIN) tablet Take 1 tablet by mouth daily.     olmesartan (BENICAR) 40 MG tablet Take 1 tablet (40 mg total) by mouth daily. 90 tablet 1   omeprazole (PRILOSEC) 20 MG capsule Take 1 capsule by mouth  every day for GERD 90 capsule 1   verapamil (CALAN) 120 MG tablet TAKE ONE TABLET BY MOUTH TWICE A DAY FOR BLOOD PRESSURE 180 tablet 3   vitamin B-12 (CYANOCOBALAMIN) 1000 MCG tablet Take 1,000 mcg by mouth daily.     buPROPion (WELLBUTRIN XL) 150 MG 24 hr tablet TAKE ONE TABLET BY MOUTH EVERY MORNING (Patient not taking: Reported on 11/19/2021) 90 tablet 1   No current facility-administered medications on file prior to visit.    Current Problems (verified) Patient Active Problem List   Diagnosis Date Noted   Hypothyroidism (04/2021)  01/01/2022   Situational depression 10/12/2021   Paresthesia  and pain of both upper extremities 01/30/2021   Aortic atherosclerosis ([place]) by CXR 05/12/2017 07/11/2020   Hyperlipidemia associated with type 2 diabetes mellitus ([place]) 06/22/2019   CKD stage 2 due to type 2 diabetes mellitus ([place]) 03/15/2019   Unsteady gait 03/12/2018   Gastroesophageal reflux disease without esophagitis 03/11/2018   Overweight (BMI 25.0-29.9) 08/11/2017   Chronic obstructive pulmonary disease ([place]) 01/29/2017   Type 2 diabetes mellitus ([place]) 08/16/2014   Essential hypertension 07/25/2013   Vitamin D deficiency 07/25/2013    Allergies Allergies  Allergen Reactions   Diltiazem     SURGICAL HISTORY She  has a past surgical history that includes Knee arthroscopy  (Left); Appendectomy; Mastectomy (Bilateral, 1985); and Cataract extraction, bilateral (Bilateral, 09/2017). FAMILY HISTORY Her family history includes Heart attack in her father and sister; Heart disease in her father and mother; Stroke in her mother. SOCIAL HISTORY She  reports that she quit smoking about 28 years ago. Her smoking use included cigarettes. She has never used smokeless tobacco. She reports that she does not drink alcohol and does not use drugs.   Review of Systems  Constitutional:  Positive for malaise/fatigue. Negative for chills, fever and weight loss.  HENT:  Positive for congestion (AM congestion only, has cat despite allergy). Negative for ear pain, hearing loss, sore throat and tinnitus.   Eyes: Negative.  Negative for blurred vision and double vision.  Respiratory:  Negative for cough, sputum production, shortness of breath and wheezing.   Cardiovascular:  Negative for chest pain, palpitations, orthopnea, claudication, leg swelling and PND.  Gastrointestinal:  Negative for abdominal pain, blood in stool, constipation, diarrhea, heartburn, melena, nausea and vomiting.  Genitourinary: Negative.   Musculoskeletal:  Negative for falls, joint pain and myalgias.  Skin: Negative.  Negative for rash.  Neurological:  Negative for dizziness, tingling, sensory change, loss of consciousness, weakness and headaches.  Endo/Heme/Allergies:  Negative for polydipsia.  Psychiatric/Behavioral:  Positive for depression. Negative for memory loss, substance abuse and suicidal ideas. The patient is not nervous/anxious and does not have insomnia.   All other systems reviewed and are negative.    Objective:     Today's Vitals   01/23/22 1048  BP: 130/80  Pulse: 60  Temp: (!) 97.3 F (36.3 C)  SpO2: 96%  Weight: 155 lb (70.3 kg)   Body mass index is 27.46 kg/m.  General appearance: alert, no distress, WD/WN, female HEENT: normocephalic, sclerae anicteric, TMs pearly, nares  patent, no discharge or erythema, pharynx normal Oral cavity: MMM, no lesions Neck: supple, no lymphadenopathy, no thyromegaly, no masses Heart: RRR, normal S1, S2, no murmurs Lungs: CTA bilaterally, no wheezes, rhonchi, or rales Abdomen: +bs, soft, non tender, non distended, no masses, no hepatomegaly, no splenomegaly Musculoskeletal: nontender, no swelling, no obvious deformity Extremities: no edema, no cyanosis, no clubbing Pulses: 2+ symmetric, upper and lower extremities, normal cap refill Neurological: alert, oriented x 3, CN2-12 intact, strength normal upper extremities, LLE mildly weak, sensation normal throughout, DTRs 2+ throughout, no cerebellar signs, gait slow Psychiatric: normal affect, behavior normal, pleasant      Izora Ribas, NP   01/23/2022

## 2022-01-23 ENCOUNTER — Ambulatory Visit (INDEPENDENT_AMBULATORY_CARE_PROVIDER_SITE_OTHER): Payer: PPO | Admitting: Adult Health

## 2022-01-23 ENCOUNTER — Encounter: Payer: Self-pay | Admitting: Adult Health

## 2022-01-23 VITALS — BP 130/80 | HR 60 | Temp 97.3°F | Wt 155.0 lb

## 2022-01-23 DIAGNOSIS — E559 Vitamin D deficiency, unspecified: Secondary | ICD-10-CM | POA: Diagnosis not present

## 2022-01-23 DIAGNOSIS — E785 Hyperlipidemia, unspecified: Secondary | ICD-10-CM

## 2022-01-23 DIAGNOSIS — N182 Chronic kidney disease, stage 2 (mild): Secondary | ICD-10-CM | POA: Diagnosis not present

## 2022-01-23 DIAGNOSIS — E1169 Type 2 diabetes mellitus with other specified complication: Secondary | ICD-10-CM

## 2022-01-23 DIAGNOSIS — J449 Chronic obstructive pulmonary disease, unspecified: Secondary | ICD-10-CM

## 2022-01-23 DIAGNOSIS — I700 Atherosclerosis of aorta: Secondary | ICD-10-CM | POA: Diagnosis not present

## 2022-01-23 DIAGNOSIS — I10 Essential (primary) hypertension: Secondary | ICD-10-CM | POA: Diagnosis not present

## 2022-01-23 DIAGNOSIS — F4321 Adjustment disorder with depressed mood: Secondary | ICD-10-CM | POA: Diagnosis not present

## 2022-01-23 DIAGNOSIS — E039 Hypothyroidism, unspecified: Secondary | ICD-10-CM

## 2022-01-23 DIAGNOSIS — E1122 Type 2 diabetes mellitus with diabetic chronic kidney disease: Secondary | ICD-10-CM | POA: Diagnosis not present

## 2022-01-23 DIAGNOSIS — E663 Overweight: Secondary | ICD-10-CM | POA: Diagnosis not present

## 2022-01-23 MED ORDER — MELOXICAM 15 MG PO TABS: ORAL_TABLET | ORAL | 1 refills | Status: DC

## 2022-01-23 MED ORDER — TRULICITY 4.5 MG/0.5ML ~~LOC~~ SOAJ: SUBCUTANEOUS | 3 refills | Status: DC

## 2022-01-24 ENCOUNTER — Other Ambulatory Visit: Payer: Self-pay | Admitting: Adult Health

## 2022-01-24 LAB — MAGNESIUM: Magnesium: 1.9 mg/dL (ref 1.5–2.5)

## 2022-01-24 LAB — TSH: TSH: 3.31 mIU/L (ref 0.40–4.50)

## 2022-01-24 LAB — LIPID PANEL
Cholesterol: 143 mg/dL (ref <200)
HDL: 40 mg/dL — ABNORMAL LOW (ref >=50)
LDL Cholesterol (Calc): 76 mg/dL (calc)
Non-HDL Cholesterol (Calc): 103 mg/dL (calc) (ref <130)
Total CHOL/HDL Ratio: 3.6 (calc) (ref <5.0)
Triglycerides: 173 mg/dL — ABNORMAL HIGH (ref <150)

## 2022-01-24 LAB — COMPLETE METABOLIC PANEL WITH GFR
AG Ratio: 2.1 (calc) (ref 1.0–2.5)
ALT: 16 U/L (ref 6–29)
AST: 19 U/L (ref 10–35)
Albumin: 4.4 g/dL (ref 3.6–5.1)
Alkaline phosphatase (APISO): 51 U/L (ref 37–153)
BUN: 16 mg/dL (ref 7–25)
CO2: 26 mmol/L (ref 20–32)
Calcium: 9.6 mg/dL (ref 8.6–10.4)
Chloride: 104 mmol/L (ref 98–110)
Creat: 0.78 mg/dL (ref 0.60–0.95)
Globulin: 2.1 g/dL (calc) (ref 1.9–3.7)
Glucose, Bld: 136 mg/dL — ABNORMAL HIGH (ref 65–99)
Potassium: 4.3 mmol/L (ref 3.5–5.3)
Sodium: 138 mmol/L (ref 135–146)
Total Bilirubin: 1.1 mg/dL (ref 0.2–1.2)
Total Protein: 6.5 g/dL (ref 6.1–8.1)
eGFR: 75 mL/min/{1.73_m2} (ref >=60)

## 2022-01-24 LAB — CBC WITH DIFFERENTIAL/PLATELET
Absolute Monocytes: 614 cells/uL (ref 200–950)
Basophils Absolute: 31 cells/uL (ref 0–200)
Basophils Relative: 0.5 %
Eosinophils Absolute: 81 cells/uL (ref 15–500)
Eosinophils Relative: 1.3 %
HCT: 43.1 % (ref 35.0–45.0)
Hemoglobin: 14.8 g/dL (ref 11.7–15.5)
Lymphs Abs: 1736 cells/uL (ref 850–3900)
MCH: 33.6 pg — ABNORMAL HIGH (ref 27.0–33.0)
MCHC: 34.3 g/dL (ref 32.0–36.0)
MCV: 97.7 fL (ref 80.0–100.0)
MPV: 11 fL (ref 7.5–12.5)
Monocytes Relative: 9.9 %
Neutro Abs: 3739 cells/uL (ref 1500–7800)
Neutrophils Relative %: 60.3 %
Platelets: 183 10*3/uL (ref 140–400)
RBC: 4.41 10*6/uL (ref 3.80–5.10)
RDW: 13.3 % (ref 11.0–15.0)
Total Lymphocyte: 28 %
WBC: 6.2 10*3/uL (ref 3.8–10.8)

## 2022-01-24 LAB — HEMOGLOBIN A1C
Hgb A1c MFr Bld: 6.3 % of total Hgb — ABNORMAL HIGH (ref <5.7)
Mean Plasma Glucose: 134 mg/dL
eAG (mmol/L): 7.4 mmol/L

## 2022-01-27 ENCOUNTER — Other Ambulatory Visit: Payer: Self-pay | Admitting: Internal Medicine

## 2022-01-27 ENCOUNTER — Other Ambulatory Visit: Payer: Self-pay | Admitting: Nurse Practitioner

## 2022-01-27 DIAGNOSIS — I10 Essential (primary) hypertension: Secondary | ICD-10-CM

## 2022-01-27 DIAGNOSIS — K219 Gastro-esophageal reflux disease without esophagitis: Secondary | ICD-10-CM

## 2022-01-27 MED ORDER — ATENOLOL 100 MG PO TABS: ORAL_TABLET | ORAL | 3 refills | Status: DC

## 2022-01-28 ENCOUNTER — Other Ambulatory Visit: Payer: Self-pay | Admitting: Internal Medicine

## 2022-02-03 ENCOUNTER — Other Ambulatory Visit: Payer: Self-pay | Admitting: Internal Medicine

## 2022-02-03 DIAGNOSIS — I10 Essential (primary) hypertension: Secondary | ICD-10-CM

## 2022-02-03 MED ORDER — ATENOLOL 100 MG PO TABS: ORAL_TABLET | ORAL | 3 refills | Status: DC

## 2022-02-12 ENCOUNTER — Encounter: Payer: Self-pay | Admitting: Nurse Practitioner

## 2022-02-18 ENCOUNTER — Telehealth: Payer: Self-pay

## 2022-02-18 NOTE — Telephone Encounter (Signed)
LM-02/18/22-Chart review started. Reviewing OV, Consults, Hospital visits, Labs and medication changes.  Chart review complete. Calling pt. To complete HTN review call. Unable to reach patient. [place] X1.  Total time spent: 15 min.

## 2022-02-19 NOTE — Telephone Encounter (Signed)
LM-02/19/22-Called pt.to complete HTN review call. Unable to reach patient. [place] X2.  Total time spent: 2 min.

## 2022-02-20 NOTE — Telephone Encounter (Signed)
LM-02/20/22-Called pt. To complete HTN review call. Third attempt reaching patient unsuccessful. [place] X3.  Total time spent: 2 min.

## 2022-02-26 ENCOUNTER — Telehealth: Payer: PPO | Admitting: Pharmacy Technician

## 2022-03-21 ENCOUNTER — Telehealth: Payer: Self-pay

## 2022-03-21 NOTE — Telephone Encounter (Signed)
LM-03/21/22-Called pt. For a quick wellness check and schedule CP FOC. Unable to reach pt. [place] X1.  Total time spent: 2 min.

## 2022-03-25 NOTE — Telephone Encounter (Signed)
LM-03/25/22-Called pt. For quick wellness check and schedule CP focused outreach. Spoke to pt. And she stated "I really don't think this is necessary." Pt. Stated she'd like to keep her husband in the program, but she would like to discontinue CCM services because she does not think she needs it. Asked pt. If she would be interested in re-enrolling and pt. Stated "Im not sure I really need that, I don't see any point or any advantage to it." Advised pt. To reach out if she changes her mind or would like to re-enroll. Pt. Verbalized understanding and agreed. Unenrollment protocol completed for CCM.  Total time spent: 8 min.

## 2022-03-26 DIAGNOSIS — H524 Presbyopia: Secondary | ICD-10-CM | POA: Diagnosis not present

## 2022-03-26 DIAGNOSIS — H52203 Unspecified astigmatism, bilateral: Secondary | ICD-10-CM | POA: Diagnosis not present

## 2022-03-26 DIAGNOSIS — H5213 Myopia, bilateral: Secondary | ICD-10-CM | POA: Diagnosis not present

## 2022-05-05 ENCOUNTER — Encounter: Payer: Self-pay | Admitting: Internal Medicine

## 2022-05-05 NOTE — Progress Notes (Unsigned)
Annual Screening/Preventative Visit & Comprehensive Evaluation &  Examination  Future Appointments  Date Time Provider Department  05/06/2022 10:00 AM Unk Pinto, MD [facility]  10/16/2022 10:00 AM Darrol Jump, NP [facility]  05/12/2023 10:00 AM Unk Pinto, MD [facility]          This very nice 86 y.o. MWF with HTN, HLD, T2_NIDDM  and Vitamin D Deficiency  presents for a Screening /Preventative Visit & comprehensive evaluation and management of multiple medical co-morbidities.  CXR in 2018 showed Aortic Atherosclerosis & Emphysema.         HTN predates circa 1992. Patient's BP has been controlled at home and patient denies any cardiac symptoms as chest pain, palpitations, shortness of breath, dizziness or ankle swelling. Today's BP is                              .         Patient's hyperlipidemia is controlled with diet and Atorvastatin. Patient denies myalgias or other medication SE's. Last lipids were at goal except elevated Trig's:  Lab Results  Component Value Date   CHOL 143 01/23/2022   HDL 40 (L) 01/23/2022   LDLCALC 76 01/23/2022   TRIG 173 (H) 01/23/2022   CHOLHDL 3.6 01/23/2022         Patient has hx/o T2_NIDDM (2009)  w/CKD2 (A1c 6.5% /2009 & 2012 and 6.4% /2016). After a 20# weight loss in 2020, she was able to taper off of her Metformin.  Patient denies reactive hypoglycemic symptoms, visual blurring, diabetic polys or paresthesias. Last A1c was not at goal:  Lab Results  Component Value Date   HGBA1C 6.3 (H) 01/23/2022         Finally, patient has history of Vitamin D Deficiency ("39"/ 2018) and last Vitamin D was at goal:  Lab Results  Component Value Date   VD25OH 79 05/04/2021       Current Outpatient Medications:     aspirin 81 MG tablet, Take  daily.   atenolol 100 MG tablet, Take  1 tablet  Daily     atorvastatin  40 MG tablet, Take 1 tablet daily    CALCIUM-MAGNESIUM-VITAMIN D P, Take 1 tablet  daily. Contains Vitamin 6300  IU   Dulaglutide (TRULICITY) 4.5 MG, INJECT 4.5 MG  WEEKLY    meloxicam 15 MG tablet, Take 1/2 to 1 tablet daily \   Multiple Vitamin, Take 1 tablet  daily   olmesartan 40 MG tablet, Take 1 tablet  daily   omeprazole 20 MG capsule, Take  1 capsule  Daily  t   verapamil  120 MG tablet, TAKE ONE TABLET  TWICE A DAY    vitamin B-12  1000 MCG tablet, Take \ daily.                                                                                               Allergies  Allergen Reactions   Diltiazem      Past Medical History:  Diagnosis Date   DDD (degenerative disc disease), lumbar  Fatty liver disease, nonalcoholic    CT AB 6803   Hyperlipidemia    Hypertension    Type II  diabetes mellitus     Vitamin D deficiency      Health Maintenance  Topic Date Due   Zoster Vaccines- Shingrix (1 of 2) Never done   COVID-19 Vaccine (4 - Booster for Pfizer series) 06/11/2020   FOOT EXAM  12/29/2020   URINE MICROALBUMIN  12/30/2020   INFLUENZA VACCINE  02/12/2021   TETANUS/TDAP  07/09/2021   HEMOGLOBIN A1C  07/15/2021   OPHTHALMOLOGY EXAM  03/05/2022   Pneumonia Vaccine 83+ Years old  Completed   DEXA SCAN  Completed     Immunization History  Administered Date(s) Administered   H1N1 06/28/2008   Influenza  05/04/2013   Influenza, High Dose 05/07/2017, 04/07/2019, 04/26/2020   Influenza 04/14/2018   PFIZER SARS-COV-2 Vacc 07/28/2019, 08/28/2019, 04/16/2020   Pneumococcal -13 04/26/2014   Pneumococcal -23 07/10/2011   Td 07/10/2011   Zoster, Live 06/28/2008    Last Colon - 08/2003 - Dr Delfin Edis - Diverticulosis - Aged out   Last MGM -  2003 - Bilat sub-cutaneous ressections   Past Surgical History:  Procedure Laterality Date   APPENDECTOMY     CATARACT EXTRACTION, BILATERAL Bilateral 09/2017   Dr. Gershon Crane   KNEE ARTHROSCOPY Left    MASTECTOMY Bilateral    30 years ago     Family History  Problem Relation Age of Onset   Stroke Mother    Heart disease Mother     Heart disease Father    Heart attack Father    Heart attack Sister      Social History   Tobacco Use   Smoking status: Former    Types: Cigarettes    Quit date: 07/15/1993    Years since quitting: 27.8   Smokeless tobacco: Never  Substance Use Topics   Alcohol use: No   Drug use: No      ROS Constitutional: Denies fever, chills, weight loss/gain, headaches, insomnia,  night sweats, and change in appetite. Does c/o fatigue. Eyes: Denies redness, blurred vision, diplopia, discharge, itchy, watery eyes.  ENT: Denies discharge, congestion, post nasal drip, epistaxis, sore throat, earache, hearing loss, dental pain, Tinnitus, Vertigo, Sinus pain, snoring.  Cardio: Denies chest pain, palpitations, irregular heartbeat, syncope, dyspnea, diaphoresis, orthopnea, PND, claudication, edema Respiratory: denies cough, dyspnea, DOE, pleurisy, hoarseness, laryngitis, wheezing.  Gastrointestinal: Denies dysphagia, heartburn, reflux, water brash, pain, cramps, nausea, vomiting, bloating, diarrhea, constipation, hematemesis, melena, hematochezia, jaundice, hemorrhoids Genitourinary: Denies dysuria, frequency, urgency, nocturia, hesitancy, discharge, hematuria, flank pain Breast: Breast lumps, nipple discharge, bleeding.  Musculoskeletal: Denies arthralgia, myalgia, stiffness, Jt. Swelling, pain, limp, and strain/sprain. Denies falls. Skin: Denies puritis, rash, hives, warts, acne, eczema, changing in skin lesion Neuro: No weakness, tremor, incoordination, spasms, paresthesia, pain Psychiatric: Denies confusion, memory loss, sensory loss. Denies Depression. Endocrine: Denies change in weight, skin, hair change, nocturia, and paresthesia, diabetic polys, visual blurring, hyper / hypo glycemic episodes.  Heme/Lymph: No excessive bleeding, bruising, enlarged lymph nodes.  Physical Exam  There were no vitals taken for this visit.  General Appearance: Over nourished, well groomed and in no apparent  distress.  Eyes: PERRLA, EOMs, conjunctiva no swelling or erythema, normal fundi and vessels. Sinuses: No frontal/maxillary tenderness ENT/Mouth: EACs patent / TMs  nl. Nares clear without erythema, swelling, mucoid exudates. Oral hygiene is good. No erythema, swelling, or exudate. Tongue normal, non-obstructing. Tonsils not swollen or erythematous. Hearing normal.  Neck: Supple,  thyroid not palpable. No bruits, nodes or JVD. Respiratory: Respiratory effort normal.  BS equal and clear bilateral without rales, rhonci, wheezing or stridor. Cardio: Heart sounds are normal with regular rate and rhythm and no murmurs, rubs or gallops. Peripheral pulses are normal and equal bilaterally without edema. No aortic or femoral bruits. Chest: symmetric with normal excursions and percussion. Breasts: Symmetric, without lumps, nipple discharge, retractions, or fibrocystic changes.  Abdomen: Flat, soft with bowel sounds active. Nontender, no guarding, rebound, hernias, masses, or organomegaly.  Lymphatics: Non tender without lymphadenopathy.  Genitourinary:  Musculoskeletal: Full ROM all peripheral extremities, joint stability, 5/5 strength, and normal gait. Skin: Warm and dry without rashes, lesions, cyanosis, clubbing or  ecchymosis.  Neuro: Cranial nerves intact, reflexes equal bilaterally. Normal muscle tone, no cerebellar symptoms. Sensation intact.  Pysch: Alert and oriented X 3, normal affect, Insight and Judgment appropriate.    Assessment and Plan  1. Annual Preventative Screening Examination   2. Essential hypertension  - Patient is strongly encouraged to take her BP meds on schedule & Is asked to monitor her BP's 2 x /day - am& pm & call if remain elevated.   - EKG 12-Lead - Urinalysis, Routine w reflex microscopic - Microalbumin / creatinine urine ratio - CBC with Differential/Platelet - COMPLETE METABOLIC PANEL WITH GFR - Magnesium - TSH  3. Hyperlipidemia associated with type 2  diabetes mellitus ([place])  - EKG 12-Lead - Lipid panel - TSH  4. Type 2 diabetes mellitus with stage 2 chronic kidney  disease, without long-term current use of insulin ([facility])  - EKG 12-Lead - Urinalysis, Routine w reflex microscopic - Microalbumin / creatinine urine ratio - HM DIABETES FOOT EXAM - LOW EXTREMITY NEUR EXAM DOCUM - Hemoglobin A1c - Insulin, random  5. Vitamin D deficiency  - VITAMIN D 25 Hydroxy   6. Diet-controlled diabetes mellitus ([place])  - EKG 12-Lead  7. Aortic atherosclerosis ([place]) by CXR 05/12/2017  - EKG 12-Lead  8. Screening for colorectal cancer  - POC Hemoccult Bld/Stl  9. Screening for ischemic heart disease  - EKG 12-Lead  10. FHx: heart disease  - EKG 12-Lead  11. Former smoker  - EKG 12-Lead  12. Medication management  - Urinalysis, Routine w reflex microscopic - Microalbumin / creatinine urine ratio - CBC with Differential/Platelet - COMPLETE METABOLIC PANEL WITH GFR - Magnesium - Lipid panel - TSH - Hemoglobin A1c - Insulin, random - VITAMIN D 25 Hydroxy          Patient was counseled in prudent diet to achieve/maintain BMI less than 25 for weight control, BP monitoring, regular exercise and medications. Discussed med's effects and SE's. Screening labs and tests as requested with regular follow-up as recommended. Over 40 minutes of exam, counseling, chart review and high complex critical decision making was performed.   Kirtland Bouchard, MD

## 2022-05-05 NOTE — Patient Instructions (Signed)
Due to recent changes in healthcare laws, you may see the results of your imaging and laboratory studies on MyChart before your provider has had a chance to review them.  We understand that in some cases there may be results that are confusing or concerning to you. Not all laboratory results come back in the same time frame and the provider may be waiting for multiple results in order to interpret others.  Please give [place] 48 hours in order for your provider to thoroughly review all the results before contacting the office for clarification of your results.  +++++++++++++++++++++++++ Atrial Fibrillation  Atrial fibrillation is a type of irregular or rapid heartbeat (arrhythmia). In atrial fibrillation, the top part of the heart (atria) beats in an irregular pattern. This makes the heart unable to pump blood normally and effectively. The goal of treatment is to prevent blood clots from forming, control your heart rate, or restore your heartbeat to a normal rhythm. If this condition is not treated, it can cause serious problems, such as a weakened heart muscle (cardiomyopathy) or a stroke. What are the causes? This condition is often caused by medical conditions that damage the heart's electrical system. These include: High blood pressure (hypertension). This is the most common cause. Certain heart problems or conditions, such as heart failure, coronary artery disease, heart valve problems, or heart surgery. Diabetes. Overactive thyroid (hyperthyroidism). Obesity. Chronic kidney disease. In some cases, the cause of this condition is not known. What increases the risk? This condition is more likely to develop in: Older people. People who smoke. Athletes who do endurance exercise. People who have a family history of atrial fibrillation. Men. People who use drugs. People who drink a lot of alcohol. People who have lung conditions, such as emphysema, pneumonia, or COPD. People who have obstructive  sleep apnea. What are the signs or symptoms? Symptoms of this condition include: A feeling that your heart is racing or beating irregularly. Discomfort or pain in your chest. Shortness of breath. Sudden light-headedness or weakness. Tiring easily during exercise or activity. Fatigue. Syncope (fainting). Sweating. In some cases, there are no symptoms. How is this diagnosed? Your health care provider may detect atrial fibrillation when taking your pulse. If detected, this condition may be diagnosed with: An electrocardiogram (ECG) to check electrical signals of the heart. An ambulatory cardiac monitor to record your heart's activity for a few days. A transthoracic echocardiogram (TTE) to create pictures of your heart. A transesophageal echocardiogram (TEE) to create even closer pictures of your heart. A stress test to check your blood supply while you exercise. Imaging tests, such as a CT scan or chest X-ray. Blood tests. How is this treated? Treatment depends on underlying conditions and how you feel when you experience atrial fibrillation. This condition may be treated with: Medicines to prevent blood clots or to treat heart rate or heart rhythm problems. Electrical cardioversion to reset the heart's rhythm. A pacemaker to correct abnormal heart rhythm. Ablation to remove the heart tissue that sends abnormal signals. Left atrial appendage closure to seal the area where blood clots can form. In some cases, underlying conditions will be treated. Follow these instructions at home: Medicines Take over-the counter and prescription medicines only as told by your health care provider. Do not take any new medicines without talking to your health care provider. If you are taking blood thinners: Talk with your health care provider before you take any medicines that contain aspirin or NSAIDs, such as ibuprofen. These medicines increase  your risk for dangerous bleeding. Take your medicine  exactly as told, at the same time every day. Avoid activities that could cause injury or bruising, and follow instructions about how to prevent falls. Wear a medical alert bracelet or carry a card that lists what medicines you take. Lifestyle     Do not use any products that contain nicotine or tobacco, such as cigarettes, e-cigarettes, and chewing tobacco. If you need help quitting, ask your health care provider. Eat heart-healthy foods. Talk with a dietitian to make an eating plan that is right for you. Exercise regularly as told by your health care provider. Do not drink alcohol. Lose weight if you are overweight. Do not use drugs, including cannabis. General instructions If you have obstructive sleep apnea, manage your condition as told by your health care provider. Do not use diet pills unless your health care provider approves. Diet pills can make heart problems worse. Keep all follow-up visits as told by your health care provider. This is important. Contact a health care provider if you: Notice a change in the rate, rhythm, or strength of your heartbeat. Are taking a blood thinner and you notice more bruising. Tire more easily when you exercise or do heavy work. Have a sudden change in weight. Get help right away if you have:  Chest pain, abdominal pain, sweating, or weakness. Trouble breathing. Side effects of blood thinners, such as blood in your vomit, stool, or urine, or bleeding that cannot stop. Any symptoms of a stroke. "BE FAST" is an easy way to remember the main warning signs of a stroke: B - Balance. Signs are dizziness, sudden trouble walking, or loss of balance. E - Eyes. Signs are trouble seeing or a sudden change in vision. F - Face. Signs are sudden weakness or numbness of the face, or the face or eyelid drooping on one side. A - Arms. Signs are weakness or numbness in an arm. This happens suddenly and usually on one side of the body. S - Speech. Signs are  sudden trouble speaking, slurred speech, or trouble understanding what people say. T - Time. Time to call emergency services. Write down what time symptoms started. Other signs of a stroke, such as: A sudden, severe headache with no known cause. Nausea or vomiting. Seizure. These symptoms may represent a serious problem that is an emergency. Do not wait to see if the symptoms will go away. Get medical help right away. Call your local emergency services (911 in the [place] Do not drive yourself to the hospital. Summary Atrial fibrillation is a type of irregular or rapid heartbeat (arrhythmia). Symptoms include a feeling that your heart is beating fast or irregularly. You may be given medicines to prevent blood clots or to treat heart rate or heart rhythm problems. Get help right away if you have signs or symptoms of a stroke. Get help right away if you cannot catch your breath or have chest pain or pressure. This information is not intended to replace advice given to you by your health care provider. Make sure you discuss any questions you have with your health care provider. Document Revised: 12/23/2018 Document Reviewed: 12/23/2018 Elsevier Patient Education  [place].    +++++++++++++++++++++++++  Vit D  & Vit C 1,000 mg   are recommended to help protect  against the Covid-19 and other Corona viruses.    Also it's recommended  to take  Zinc 50 mg  to help  protect against the Covid-19  and best place to get  is also on [employer]com  and don't pay more than 6-8 cents /pill !  ================================ Coronavirus (COVID-19) Are you at risk?  Are you at risk for the Coronavirus (COVID-19)?  To be considered HIGH RISK for Coronavirus (COVID-19), you have to meet the following criteria:  Traveled to [place], [place], [place], [place] or [place]; or in the [place] to [place], [place], [place]  or [place]; and have fever, cough, and shortness of breath  within the last 2 weeks of travel OR Been in close contact with a person diagnosed with COVID-19 within the last 2 weeks and have  fever, cough,and shortness of breath  IF YOU DO NOT MEET THESE CRITERIA, YOU ARE CONSIDERED LOW RISK FOR COVID-19.  What to do if you are HIGH RISK for COVID-19?  If you are having a medical emergency, call 911. Seek medical care right away. Before you go to a doctor's office, urgent care or emergency department,  call ahead and tell them about your recent travel, contact with someone diagnosed with COVID-19   and your symptoms.  You should receive instructions from your physician's office regarding next steps of care.  When you arrive at healthcare provider, tell the healthcare staff immediately you have returned from  visiting [place], [place], [place], [place] or [place]; or traveled in the [place] to [place], [place],  [place] or [place] in the last two weeks or you have been in close contact with a person diagnosed with  COVID-19 in the last 2 weeks.   Tell the health care staff about your symptoms: fever, cough and shortness of breath. After you have been seen by a medical provider, you will be either: Tested for (COVID-19) and discharged home on quarantine except to seek medical care if  symptoms worsen, and asked to  Stay home and avoid contact with others until you get your results (4-5 days)  Avoid travel on public transportation if possible (such as bus, train, or airplane) or Sent to the Emergency Department by EMS for evaluation, COVID-19 testing  and  possible admission depending on your condition and test results.  What to do if you are LOW RISK for COVID-19?  Reduce your risk of any infection by using the same precautions used for avoiding the common cold or flu:  Wash your hands often with soap and warm water for at least 20 seconds.  If soap and water are not readily available,  use an alcohol-based hand sanitizer with at least  60% alcohol.  If coughing or sneezing, cover your mouth and nose by coughing or sneezing into the elbow areas of your shirt or coat,  into a tissue or into your sleeve (not your hands). Avoid shaking hands with others and consider head nods or verbal greetings only. Avoid touching your eyes, nose, or mouth with unwashed hands.  Avoid close contact with people who are sick. Avoid places or events with large numbers of people in one location, like concerts or sporting events. Carefully consider travel plans you have or are making. If you are planning any travel outside or inside the [place], visit the CDC's Travelers' Health webpage for the latest health notices. If you have some symptoms but not all symptoms, continue to monitor at home and seek medical attention  if your symptoms worsen. If you are having a medical emergency, call 911.   >>>>>>>>>>>>>>>>>>>>>>>>>>>>>>>>>  We Do NOT Approve of [employer] > > > > > > > > > > > > > > > > > > > > > > > > > > > > > > > > > > > > > > >  Preventive Care for Adults  A healthy lifestyle and preventive care can promote health and wellness. Preventive health guidelines for women include the following key practices. A routine yearly physical is a good way to check with your health care provider about your health and preventive screening. It is a chance to share any concerns and updates on your health and to receive a thorough exam. Visit your dentist for a routine exam and preventive care every 6 months. Brush your teeth twice a day and floss once a day. Good oral hygiene prevents tooth decay and gum disease. The frequency of eye exams is based on your age, health, family medical history, use of contact lenses, and other factors. Follow your health care provider's recommendations for frequency of eye exams. Eat a healthy diet. Foods like vegetables, fruits, whole grains, low-fat dairy products, and lean protein foods contain the nutrients you need  without too many calories. Decrease your intake of foods high in solid fats, added sugars, and salt. Eat the right amount of calories for you. Get information about a proper diet from your health care provider, if necessary. Regular physical exercise is one of the most important things you can do for your health. Most adults should get at least 150 minutes of moderate-intensity exercise (any activity that increases your heart rate and causes you to sweat) each week. In addition, most adults need muscle-strengthening exercises on 2 or more days a week. Maintain a healthy weight. The body mass index (BMI) is a screening tool to identify possible weight problems. It provides an estimate of body fat based on height and weight. Your health care provider can find your BMI and can help you achieve or maintain a healthy weight. For adults 20 years and older: A BMI below 18.5 is considered underweight. A BMI of 18.5 to 24.9 is normal. A BMI of 25 to 29.9 is considered overweight. A BMI of 30 and above is considered obese. Maintain normal blood lipids and cholesterol levels by exercising and minimizing your intake of saturated fat. Eat a balanced diet with plenty of fruit and vegetables. If your lipid or cholesterol levels are high, you are over 50, or you are at high risk for heart disease, you may need your cholesterol levels checked more frequently. Ongoing high lipid and cholesterol levels should be treated with medicines if diet and exercise are not working. If you smoke, find out from your health care provider how to quit. If you do not use tobacco, do not start. Lung cancer screening is recommended for adults aged 5-80 years who are at high risk for developing lung cancer because of a history of smoking. A yearly low-dose CT scan of the lungs is recommended for people who have at least a 30-pack-year history of smoking and are a current smoker or have quit within the past 15 years. A pack year of smoking is  smoking an average of 1 pack of cigarettes a day for 1 year (for example: 1 pack a day for 30 years or 2 packs a day for 15 years). Yearly screening should continue until the smoker has stopped smoking for at least 15 years. Yearly screening should be stopped for people who develop a health problem that would prevent them from having lung cancer treatment. Avoid use of street drugs. Do not share needles with anyone. Ask for help if you need support or instructions about stopping the use of drugs. High blood pressure causes heart disease and increases the risk of stroke.  Ongoing high blood pressure should be treated with medicines if weight loss and exercise do not work. If you are 23-30 years old, ask your health care provider if you should take aspirin to prevent strokes. Diabetes screening involves taking a blood sample to check your fasting blood sugar level. This should be done once every 3 years, after age 63, if you are within normal weight and without risk factors for diabetes. Testing should be considered at a younger age or be carried out more frequently if you are overweight and have at least 1 risk factor for diabetes. Breast cancer screening is essential preventive care for women. You should practice "breast self-awareness." This means understanding the normal appearance and feel of your breasts and may include breast self-examination. Any changes detected, no matter how small, should be reported to a health care provider. Women in their 52s and 30s should have a clinical breast exam (CBE) by a health care provider as part of a regular health exam every 1 to 3 years. After age 75, women should have a CBE every year. Starting at age 64, women should consider having a mammogram (breast X-ray test) every year. Women who have a family history of breast cancer should talk to their health care provider about genetic screening. Women at a high risk of breast cancer should talk to their health care  providers about having an MRI and a mammogram every year. Breast cancer gene (BRCA)-related cancer risk assessment is recommended for women who have family members with BRCA-related cancers. BRCA-related cancers include breast, ovarian, tubal, and peritoneal cancers. Having family members with these cancers may be associated with an increased risk for harmful changes (mutations) in the breast cancer genes BRCA1 and BRCA2. Results of the assessment will determine the need for genetic counseling and BRCA1 and BRCA2 testing. Routine pelvic exams to screen for cancer are no longer recommended for nonpregnant women who are considered low risk for cancer of the pelvic organs (ovaries, uterus, and vagina) and who do not have symptoms. Ask your health care provider if a screening pelvic exam is right for you. If you have had past treatment for cervical cancer or a condition that could lead to cancer, you need Pap tests and screening for cancer for at least 20 years after your treatment. If Pap tests have been discontinued, your risk factors (such as having a new sexual partner) need to be reassessed to determine if screening should be resumed. Some women have medical problems that increase the chance of getting cervical cancer. In these cases, your health care provider may recommend more frequent screening and Pap tests.  Colorectal cancer can be detected and often prevented. Most routine colorectal cancer screening begins at the age of 77 years and continues through age 37 years. However, your health care provider may recommend screening at an earlier age if you have risk factors for colon cancer. On a yearly basis, your health care provider may provide home test kits to check for hidden blood in the stool. Use of a small camera at the end of a tube, to directly examine the colon (sigmoidoscopy or colonoscopy), can detect the earliest forms of colorectal cancer. Talk to your health care provider about this at age 48,  when routine screening begins.  Direct exam of the colon should be repeated every 5-10 years through age 70 years, unless early forms of pre-cancerous polyps or small growths are found. Osteoporosis is a disease in which the bones lose minerals and strength with  aging. This can result in serious bone fractures or breaks. The risk of osteoporosis can be identified using a bone density scan. Women ages 75 years and over and women at risk for fractures or osteoporosis should discuss screening with their health care providers. Ask your health care provider whether you should take a calcium supplement or vitamin D to reduce the rate of osteoporosis. Menopause can be associated with physical symptoms and risks. Hormone replacement therapy is available to decrease symptoms and risks. You should talk to your health care provider about whether hormone replacement therapy is right for you. Use sunscreen. Apply sunscreen liberally and repeatedly throughout the day. You should seek shade when your shadow is shorter than you. Protect yourself by wearing long sleeves, pants, a wide-brimmed hat, and sunglasses year round, whenever you are outdoors. Once a month, do a whole body skin exam, using a mirror to look at the skin on your back. Tell your health care provider of new moles, moles that have irregular borders, moles that are larger than a pencil eraser, or moles that have changed in shape or color. Stay current with required vaccines (immunizations). Influenza vaccine. All adults should be immunized every year. Tetanus, diphtheria, and acellular pertussis (Td, Tdap) vaccine. Pregnant women should receive 1 dose of Tdap vaccine during each pregnancy. The dose should be obtained regardless of the length of time since the last dose. Immunization is preferred during the 27th-36th week of gestation. An adult who has not previously received Tdap or who does not know her vaccine status should receive 1 dose of Tdap. This  initial dose should be followed by tetanus and diphtheria toxoids (Td) booster doses every 10 years. Adults with an unknown or incomplete history of completing a 3-dose immunization series with Td-containing vaccines should begin or complete a primary immunization series including a Tdap dose. Adults should receive a Td booster every 10 years.  Zoster vaccine. One dose is recommended for adults aged 12 years or older unless certain conditions are present.  Pneumococcal 13-valent conjugate (PCV13) vaccine. When indicated, a person who is uncertain of her immunization history and has no record of immunization should receive the PCV13 vaccine. An adult aged 52 years or older who has certain medical conditions and has not been previously immunized should receive 1 dose of PCV13 vaccine. This PCV13 should be followed with a dose of pneumococcal polysaccharide (PPSV23) vaccine. The PPSV23 vaccine dose should be obtained at least 1 or more year(s) after the dose of PCV13 vaccine. An adult aged 66 years or older who has certain medical conditions and previously received 1 or more doses of PPSV23 vaccine should receive 1 dose of PCV13. The PCV13 vaccine dose should be obtained 1 or more years after the last PPSV23 vaccine dose.  Pneumococcal polysaccharide (PPSV23) vaccine. When PCV13 is also indicated, PCV13 should be obtained first. All adults aged 56 years and older should be immunized. An adult younger than age 78 years who has certain medical conditions should be immunized. Any person who resides in a nursing home or long-term care facility should be immunized. An adult smoker should be immunized. People with an immunocompromised condition and certain other conditions should receive both PCV13 and PPSV23 vaccines. People with human immunodeficiency virus (HIV) infection should be immunized as soon as possible after diagnosis. Immunization during chemotherapy or radiation therapy should be avoided. Routine use of  PPSV23 vaccine is not recommended for American Indians, [place] Natives, or people younger than 65 years unless there are  medical conditions that require PPSV23 vaccine. When indicated, people who have unknown immunization and have no record of immunization should receive PPSV23 vaccine. One-time revaccination 5 years after the first dose of PPSV23 is recommended for people aged 19-64 years who have chronic kidney failure, nephrotic syndrome, asplenia, or immunocompromised conditions. People who received 1-2 doses of PPSV23 before age 73 years should receive another dose of PPSV23 vaccine at age 81 years or later if at least 5 years have passed since the previous dose. Doses of PPSV23 are not needed for people immunized with PPSV23 at or after age 61 years.  Preventive Services / Frequency  Ages 36 years and over Blood pressure check. Lipid and cholesterol check. Lung cancer screening. / Every year if you are aged 46-80 years and have a 30-pack-year history of smoking and currently smoke or have quit within the past 15 years. Yearly screening is stopped once you have quit smoking for at least 15 years or develop a health problem that would prevent you from having lung cancer treatment. Clinical breast exam.** / Every year after age 85 years.  BRCA-related cancer risk assessment.** / For women who have family members with a BRCA-related cancer (breast, ovarian, tubal, or peritoneal cancers). Mammogram.** / Every year beginning at age 59 years and continuing for as long as you are in good health. Consult with your health care provider. Pap test.** / Every 3 years starting at age 45 years through age 52 or 51 years with 3 consecutive normal Pap tests. Testing can be stopped between 65 and 70 years with 3 consecutive normal Pap tests and no abnormal Pap or HPV tests in the past 10 years. Fecal occult blood test (FOBT) of stool. / Every year beginning at age 9 years and continuing until age 53 years. You may  not need to do this test if you get a colonoscopy every 10 years. Flexible sigmoidoscopy or colonoscopy.** / Every 5 years for a flexible sigmoidoscopy or every 10 years for a colonoscopy beginning at age 49 years and continuing until age 6 years. Hepatitis C blood test.** / For all people born from [birth year] through 1963 and any individual with known risks for hepatitis C. Osteoporosis screening.** / A one-time screening for women ages 78 years and over and women at risk for fractures or osteoporosis. Skin self-exam. / Monthly. Influenza vaccine. / Every year. Tetanus, diphtheria, and acellular pertussis (Tdap/Td) vaccine.** / 1 dose of Td every 10 years. Zoster vaccine.** / 1 dose for adults aged 88 years or older. Pneumococcal 13-valent conjugate (PCV13) vaccine.** / Consult your health care provider. Pneumococcal polysaccharide (PPSV23) vaccine.** / 1 dose for all adults aged 21 years and older. Screening for abdominal aortic aneurysm (AAA)  by ultrasound is recommended for people who have history of high blood pressure or who are current or former smokers. ++++++++++++++++++++ Recommend Adult Low Dose Aspirin or  coated  Aspirin 81 mg daily  To reduce risk of Colon Cancer 40 %,  Skin Cancer 26 % ,  Melanoma 46%  and  Pancreatic cancer 60% ++++++++++++++++++++ Vitamin D goal  is between 70-100.  Please make sure that you are taking your Vitamin D as directed.  It is very important as a natural anti-inflammatory  helping hair, skin, and nails, as well as reducing stroke and heart attack risk.  It helps your bones and helps with mood. It also decreases numerous cancer risks so please take it as directed.  Low Vit D is associated with a 200-300%  higher risk for CANCER  and 200-300% higher risk for HEART   ATTACK  &  STROKE.   .....................................Marland Kitchen It is also associated with higher death rate at younger ages,  autoimmune diseases like Rheumatoid arthritis, Lupus, Multiple  Sclerosis.    Also many other serious conditions, like depression, Alzheimer's Dementia, infertility, muscle aches, fatigue, fibromyalgia - just to name a few. ++++++++++++++++++ Recommend the book "The END of DIETING" by Dr Excell Seltzer  & the book "The END of DIABETES " by Dr Excell Seltzer At [email or web address] - get book & Audio CD's    Being diabetic has a  300% increased risk for heart attack, stroke, cancer, and alzheimer- type vascular dementia. It is very important that you work harder with diet by avoiding all foods that are white. Avoid white rice (brown & wild rice is OK), white potatoes (sweetpotatoes in moderation is OK), White bread or wheat bread or anything made out of white flour like bagels, donuts, rolls, buns, biscuits, cakes, pastries, cookies, pizza crust, and pasta (made from white flour & egg whites) - vegetarian pasta or spinach or wheat pasta is OK. Multigrain breads like Arnold's or Pepperidge Farm, or multigrain sandwich thins or flatbreads.  Diet, exercise and weight loss can reverse and cure diabetes in the early stages.  Diet, exercise and weight loss is very important in the control and prevention of complications of diabetes which affects every system in your body, ie. Brain - dementia/stroke, eyes - glaucoma/blindness, heart - heart attack/heart failure, kidneys - dialysis, stomach - gastric paralysis, intestines - malabsorption, nerves - severe painful neuritis, circulation - gangrene & loss of a leg(s), and finally cancer and Alzheimers.    I recommend avoid fried & greasy foods,  sweets/candy, white rice (brown or wild rice or Quinoa is OK), white potatoes (sweet potatoes are OK) - anything made from white flour - bagels, doughnuts, rolls, buns, biscuits,white and wheat breads, pizza crust and traditional pasta made of white flour & egg white(vegetarian pasta or spinach or wheat pasta is OK).  Multi-grain bread is OK - like multi-grain flat bread or sandwich thins. Avoid alcohol  in excess. Exercise is also important.    Eat all the vegetables you want - avoid meat, especially red meat and dairy - especially cheese.  Cheese is the most concentrated form of trans-fats which is the worst thing to clog up our arteries. Veggie cheese is OK which can be found in the fresh produce section at [employer] or [employer] or [employer]  +++++++++++++++++++ DASH Eating Plan  DASH stands for "Dietary Approaches to Stop Hypertension."   The DASH eating plan is a healthy eating plan that has been shown to reduce high blood pressure (hypertension). Additional health benefits may include reducing the risk of type 2 diabetes mellitus, heart disease, and stroke. The DASH eating plan may also help with weight loss. WHAT DO I NEED TO KNOW ABOUT THE DASH EATING PLAN? For the DASH eating plan, you will follow these general guidelines: Choose foods with a percent daily value for sodium of less than 5% (as listed on the food label). Use salt-free seasonings or herbs instead of table salt or sea salt. Check with your health care provider or pharmacist before using salt substitutes. Eat lower-sodium products, often labeled as "lower sodium" or "no salt added." Eat fresh foods. Eat more vegetables, fruits, and low-fat dairy products. Choose whole grains. Look for the word "whole" as the first word in the ingredient list. Choose fish  Limit sweets, desserts, sugars, and sugary drinks. Choose heart-healthy fats. Eat veggie cheese  Eat more home-cooked food and less restaurant, buffet, and fast food. Limit fried foods. Cook foods using methods other than frying. Limit canned vegetables. If you do use them, rinse them well to decrease the sodium. When eating at a restaurant, ask that your food be prepared with less salt, or no salt if possible.                      WHAT FOODS CAN I EAT? Read Dr Fara Olden Fuhrman's books on The End of Dieting & The End of Diabetes  Grains Whole grain or whole  wheat bread. Brown rice. Whole grain or whole wheat pasta. Quinoa, bulgur, and whole grain cereals. Low-sodium cereals. Corn or whole wheat flour tortillas. Whole grain cornbread. Whole grain crackers. Low-sodium crackers.  Vegetables Fresh or frozen vegetables (raw, steamed, roasted, or grilled). Low-sodium or reduced-sodium tomato and vegetable juices. Low-sodium or reduced-sodium tomato sauce and paste. Low-sodium or reduced-sodium canned vegetables.   Fruits All fresh, canned (in natural juice), or frozen fruits.  Protein Products  All fish and seafood.  Dried beans, peas, or lentils. Unsalted nuts and seeds. Unsalted canned beans.  Dairy Low-fat dairy products, such as skim or 1% milk, 2% or reduced-fat cheeses, low-fat ricotta or cottage cheese, or plain low-fat yogurt. Low-sodium or reduced-sodium cheeses.  Fats and Oils Tub margarines without trans fats. Light or reduced-fat mayonnaise and salad dressings (reduced sodium). Avocado. Safflower, olive, or canola oils. Natural peanut or almond butter.  Other Unsalted popcorn and pretzels. The items listed above may not be a complete list of recommended foods or beverages. Contact your dietitian for more options.  +++++++++++++++  WHAT FOODS ARE NOT RECOMMENDED? Grains/ White flour or wheat flour White bread. White pasta. White rice. Refined cornbread. Bagels and croissants. Crackers that contain trans fat.  Vegetables  Creamed or fried vegetables. Vegetables in a . Regular canned vegetables. Regular canned tomato sauce and paste. Regular tomato and vegetable juices.  Fruits Dried fruits. Canned fruit in light or heavy syrup. Fruit juice.  Meat and Other Protein Products Meat in general - RED meat & White meat.  Fatty cuts of meat. Ribs, chicken wings, all processed meats as bacon, sausage, bologna, salami, fatback, hot dogs, bratwurst and packaged luncheon meats.  Dairy Whole or 2% milk, cream, half-and-half, and cream  cheese. Whole-fat or sweetened yogurt. Full-fat cheeses or blue cheese. Non-dairy creamers and whipped toppings. Processed cheese, cheese spreads, or cheese curds.  Condiments Onion and garlic salt, seasoned salt, table salt, and sea salt. Canned and packaged gravies. Worcestershire sauce. Tartar sauce. Barbecue sauce. Teriyaki sauce. Soy sauce, including reduced sodium. Steak sauce. Fish sauce. Oyster sauce. Cocktail sauce. Horseradish. Ketchup and mustard. Meat flavorings and tenderizers. Bouillon cubes. Hot sauce. Tabasco sauce. Marinades. Taco seasonings. Relishes.  Fats and Oils Butter, stick margarine, lard, shortening and bacon fat. Coconut, palm kernel, or palm oils. Regular salad dressings.  Pickles and olives. Salted popcorn and pretzels.  The items listed above may not be a complete list of foods and beverages to avoid.

## 2022-05-06 ENCOUNTER — Encounter: Payer: Self-pay | Admitting: Internal Medicine

## 2022-05-06 ENCOUNTER — Ambulatory Visit (INDEPENDENT_AMBULATORY_CARE_PROVIDER_SITE_OTHER): Payer: PPO | Admitting: Internal Medicine

## 2022-05-06 VITALS — BP 140/78 | HR 75 | Temp 98.0°F | Resp 16 | Ht 63.0 in | Wt 156.2 lb

## 2022-05-06 DIAGNOSIS — E1122 Type 2 diabetes mellitus with diabetic chronic kidney disease: Secondary | ICD-10-CM

## 2022-05-06 DIAGNOSIS — Z0000 Encounter for general adult medical examination without abnormal findings: Secondary | ICD-10-CM

## 2022-05-06 DIAGNOSIS — Z136 Encounter for screening for cardiovascular disorders: Secondary | ICD-10-CM

## 2022-05-06 DIAGNOSIS — Z79899 Other long term (current) drug therapy: Secondary | ICD-10-CM

## 2022-05-06 DIAGNOSIS — I700 Atherosclerosis of aorta: Secondary | ICD-10-CM

## 2022-05-06 DIAGNOSIS — E1169 Type 2 diabetes mellitus with other specified complication: Secondary | ICD-10-CM | POA: Diagnosis not present

## 2022-05-06 DIAGNOSIS — N182 Chronic kidney disease, stage 2 (mild): Secondary | ICD-10-CM | POA: Diagnosis not present

## 2022-05-06 DIAGNOSIS — Z1211 Encounter for screening for malignant neoplasm of colon: Secondary | ICD-10-CM

## 2022-05-06 DIAGNOSIS — Z87891 Personal history of nicotine dependence: Secondary | ICD-10-CM

## 2022-05-06 DIAGNOSIS — I480 Paroxysmal atrial fibrillation: Secondary | ICD-10-CM

## 2022-05-06 DIAGNOSIS — E039 Hypothyroidism, unspecified: Secondary | ICD-10-CM

## 2022-05-06 DIAGNOSIS — E785 Hyperlipidemia, unspecified: Secondary | ICD-10-CM | POA: Diagnosis not present

## 2022-05-06 DIAGNOSIS — E559 Vitamin D deficiency, unspecified: Secondary | ICD-10-CM | POA: Diagnosis not present

## 2022-05-06 DIAGNOSIS — I10 Essential (primary) hypertension: Secondary | ICD-10-CM | POA: Diagnosis not present

## 2022-05-06 DIAGNOSIS — Z0001 Encounter for general adult medical examination with abnormal findings: Secondary | ICD-10-CM

## 2022-05-06 DIAGNOSIS — Z8249 Family history of ischemic heart disease and other diseases of the circulatory system: Secondary | ICD-10-CM | POA: Diagnosis not present

## 2022-05-06 MED ORDER — DILTIAZEM HCL ER COATED BEADS 240 MG PO CP24: 240.0000 mg | ORAL_CAPSULE | Freq: Every day | ORAL | 11 refills | Status: DC

## 2022-05-06 MED ORDER — DILTIAZEM HCL ER COATED BEADS 240 MG PO CP24: ORAL_CAPSULE | ORAL | 1 refills | Status: DC

## 2022-05-07 LAB — COMPLETE METABOLIC PANEL WITH GFR
AG Ratio: 2.1 (calc) (ref 1.0–2.5)
ALT: 28 U/L (ref 6–29)
AST: 25 U/L (ref 10–35)
Albumin: 4.7 g/dL (ref 3.6–5.1)
Alkaline phosphatase (APISO): 57 U/L (ref 37–153)
BUN: 22 mg/dL (ref 7–25)
CO2: 29 mmol/L (ref 20–32)
Calcium: 9.7 mg/dL (ref 8.6–10.4)
Chloride: 104 mmol/L (ref 98–110)
Creat: 0.73 mg/dL (ref 0.60–0.95)
Globulin: 2.2 g/dL (calc) (ref 1.9–3.7)
Glucose, Bld: 127 mg/dL — ABNORMAL HIGH (ref 65–99)
Potassium: 4.7 mmol/L (ref 3.5–5.3)
Sodium: 142 mmol/L (ref 135–146)
Total Bilirubin: 1.1 mg/dL (ref 0.2–1.2)
Total Protein: 6.9 g/dL (ref 6.1–8.1)
eGFR: 81 mL/min/{1.73_m2} (ref >=60)

## 2022-05-07 LAB — CBC WITH DIFFERENTIAL/PLATELET
Absolute Monocytes: 767 cells/uL (ref 200–950)
Basophils Absolute: 29 cells/uL (ref 0–200)
Basophils Relative: 0.4 %
Eosinophils Absolute: 153 cells/uL (ref 15–500)
Eosinophils Relative: 2.1 %
HCT: 43.8 % (ref 35.0–45.0)
Hemoglobin: 15 g/dL (ref 11.7–15.5)
Lymphs Abs: 1445 cells/uL (ref 850–3900)
MCH: 33.3 pg — ABNORMAL HIGH (ref 27.0–33.0)
MCHC: 34.2 g/dL (ref 32.0–36.0)
MCV: 97.3 fL (ref 80.0–100.0)
MPV: 11.2 fL (ref 7.5–12.5)
Monocytes Relative: 10.5 %
Neutro Abs: 4906 cells/uL (ref 1500–7800)
Neutrophils Relative %: 67.2 %
Platelets: 151 10*3/uL (ref 140–400)
RBC: 4.5 10*6/uL (ref 3.80–5.10)
RDW: 12.7 % (ref 11.0–15.0)
Total Lymphocyte: 19.8 %
WBC: 7.3 10*3/uL (ref 3.8–10.8)

## 2022-05-07 LAB — MICROALBUMIN / CREATININE URINE RATIO
Creatinine, Urine: 93 mg/dL (ref 20–275)
Microalb Creat Ratio: 14 mcg/mg creat (ref <30)
Microalb, Ur: 1.3 mg/dL

## 2022-05-07 LAB — HEMOGLOBIN A1C
Hgb A1c MFr Bld: 6.8 % of total Hgb — ABNORMAL HIGH (ref <5.7)
Mean Plasma Glucose: 148 mg/dL
eAG (mmol/L): 8.2 mmol/L

## 2022-05-07 LAB — LIPID PANEL
Cholesterol: 149 mg/dL (ref <200)
HDL: 41 mg/dL — ABNORMAL LOW (ref >=50)
LDL Cholesterol (Calc): 81 mg/dL (calc)
Non-HDL Cholesterol (Calc): 108 mg/dL (calc) (ref <130)
Total CHOL/HDL Ratio: 3.6 (calc) (ref <5.0)
Triglycerides: 174 mg/dL — ABNORMAL HIGH (ref <150)

## 2022-05-07 LAB — URINALYSIS, ROUTINE W REFLEX MICROSCOPIC
Bilirubin Urine: NEGATIVE
Glucose, UA: NEGATIVE
Hgb urine dipstick: NEGATIVE
Ketones, ur: NEGATIVE
Leukocytes,Ua: NEGATIVE
Nitrite: NEGATIVE
Protein, ur: NEGATIVE
Specific Gravity, Urine: 1.02 (ref 1.001–1.035)
pH: 6 (ref 5.0–8.0)

## 2022-05-07 LAB — INSULIN, RANDOM: Insulin: 24.5 u[IU]/mL — ABNORMAL HIGH

## 2022-05-07 LAB — TSH: TSH: 4.06 mIU/L (ref 0.40–4.50)

## 2022-05-07 LAB — MAGNESIUM: Magnesium: 2 mg/dL (ref 1.5–2.5)

## 2022-05-07 LAB — VITAMIN D 25 HYDROXY (VIT D DEFICIENCY, FRACTURES): Vit D, 25-Hydroxy: 69 ng/mL (ref 30–100)

## 2022-05-07 NOTE — Progress Notes (Signed)
<><><><><><><><><><><><><><><><><><><><><><><><><><><><><><><><><> <><><><><><><><><><><><><><><><><><><><><><><><><><><><><><><><><> - Test results slightly outside the reference range are not unusual. If there is anything important, I will review this with you,  otherwise it is considered normal test values.  If you have further questions,  please do not hesitate to contact me at the office or via My Chart.  <><><><><><><><><><><><><><><><><><><><><><><><><><><><><><><><><> <><><><><><><><><><><><><><><><><><><><><><><><><><><><><><><><><>  -  A1c = 6.8% - in Diabetic range   Being diabetic has a  300% increased risk for heart attack,                                                     stroke, cancer, and alzheimer- type vascular dementia.   It is very important that you work harder with diet by                                    avoiding all foods that are white except chicken, fish & calliflower  - Avoid white rice  (brown & wild rice is OK),   - Avoid white potatoes  (sweet potatoes in moderation is OK),   White bread or wheat bread or anything made out of   white flour like bagels, donuts, rolls, buns, biscuits, cakes,  - pastries, cookies, pizza crust, and pasta (made from  white flour & egg whites)   - vegetarian pasta or spinach or wheat pasta is OK.  - Multigrain breads like Arnold's, Pepperidge Farm or   multigrain sandwich thins or high fiber breads like                                        [place] bread or "Dave's Killer" breads that are                                                                          4 to 5 grams fiber per slice !  are best.    Diet, exercise and weight loss can reverse and cure diabetes in the early stages.    - Diet, exercise and weight loss is very important in the   control and prevention of complications of diabetes which                                                                       affects every system in your body, ie.    -Brain - dementia/stroke,  - eyes - glaucoma/blindness,  - heart - heart attack/heart failure,  - kidneys - dialysis,  - stomach - gastric paralysis,  - intestines - malabsorption,  - nerves - severe painful neuritis,  - circulation - gangrene & loss of a leg(s)  - and finally  . . . . . . . . . . . . . . . . . Marland Kitchen    -  cancer and Alzheimers. <><><><><><><><><><><><><><><><><><><><><><><><><><><><><><><><><>  - Total Chol = 149   &   LDL Chol = 81   - Both  Excellent   - Very low risk for Heart Attack  / Stroke <><><><><><><><><><><><><><><><><><><><><><><><><><><><><><><><><>  - Vitamin D = 69 - Great level -   Keep dose same  ! <><><><><><><><><><><><><><><><><><><><><><><><><><><><><><><><><>  -

## 2022-05-15 ENCOUNTER — Other Ambulatory Visit: Payer: Self-pay | Admitting: Internal Medicine

## 2022-05-15 MED ORDER — AZITHROMYCIN 250 MG PO TABS: ORAL_TABLET | ORAL | 1 refills | Status: DC

## 2022-05-23 ENCOUNTER — Ambulatory Visit: Payer: PPO | Attending: Internal Medicine | Admitting: Internal Medicine

## 2022-05-23 ENCOUNTER — Encounter: Payer: Self-pay | Admitting: Internal Medicine

## 2022-05-23 VITALS — BP 134/70 | HR 62 | Ht 62.5 in | Wt 156.0 lb

## 2022-05-23 DIAGNOSIS — I480 Paroxysmal atrial fibrillation: Secondary | ICD-10-CM | POA: Diagnosis not present

## 2022-05-23 MED ORDER — APIXABAN 5 MG PO TABS: 5.0000 mg | ORAL_TABLET | Freq: Two times a day (BID) | ORAL | 5 refills | Status: DC

## 2022-05-23 NOTE — Progress Notes (Addendum)
Cardiology Office Note:    Date:  05/23/2022   ID:  Debra Collins, DOB [birth year], MRN [id number]  PCP:  Unk Pinto, [place] Providers Cardiologist:  None     Referring MD: Unk Pinto, MD   No chief complaint on file. Atrial Fibrillation  History of Present Illness:    Debra Collins is a 86 y.o. female with a hx of HTN, HLD, T2DM, referral for atrial fibrillation; ecg 05/06/2022 appears more like atrial flutter/coarse atrial fibrillation with LBBB morphology. She gets tired easily and SOB. She notes some occasional L chest discomfort.   Stroke -no hx  TSH nl  No orthopnea/PND, no LE edema  Blood pressure is normal.  Past Medical History:  Diagnosis Date   DDD (degenerative disc disease), lumbar    Fatty liver disease, nonalcoholic    CT AB 7989   Fibrocystic breast disease    Hyperlipidemia    Hypertension    Type II or unspecified type diabetes mellitus without mention of complication, not stated as uncontrolled    Vitamin D deficiency     Past Surgical History:  Procedure Laterality Date   APPENDECTOMY     CATARACT EXTRACTION, BILATERAL Bilateral 09/2017   Dr. Gershon Crane   KNEE ARTHROSCOPY Left    MASTECTOMY Bilateral 1985   Due to severe fibrocystic breast disease    Current Medications: Current Meds  Medication Sig   atenolol (TENORMIN) 100 MG tablet Take  1 tablet  Daily  for BP                                                                    /                                TAKE                          BY                    MOUTH   atorvastatin (LIPITOR) 40 MG tablet Take 1 tablet daily as directed for cholesterol.   azithromycin (ZITHROMAX) 250 MG tablet Take 2 tablets with Food on  Day 1, then 1 tablet Daily with Food for Sinusitis / Bronchitis   CALCIUM-MAGNESIUM-VITAMIN D PO Take 1 tablet by mouth daily. Contains Vitamin 6300 IU   diltiazem (CARTIA XT) 240 MG 24 hr capsule Take  1 capsule  Daily  for BP & Heart Rhythm   ( STOP VERAPAMIL )   meloxicam (MOBIC) 15 MG tablet Take 1/2 to 1 tablet daily with food for Pain / Inflammation & Limit to 5 days/week to prevent Kidney Damage   Multiple Vitamin (MULTIVITAMIN) tablet Take 1 tablet by mouth daily.   olmesartan (BENICAR) 40 MG tablet Take 1 tablet (40 mg total) by mouth daily.   omeprazole (PRILOSEC) 20 MG capsule Take  1 capsule  Daily  to Prevent Heartburn & Indigestion                                    /  TAKE                          BY                       MOUTH   vitamin B-12 (CYANOCOBALAMIN) 1000 MCG tablet Take 1,000 mcg by mouth daily.     Allergies:   Patient has no active allergies.   Social History   Socioeconomic History   Marital status: Married    Spouse name: Not on file   Number of children: Not on file   Years of education: Not on file   Highest education level: Not on file  Occupational History   Occupation: retired    Comment: insurance  Tobacco Use   Smoking status: Former    Types: Cigarettes    Quit date: 07/15/1993    Years since quitting: 28.8   Smokeless tobacco: Never  Substance and Sexual Activity   Alcohol use: No   Drug use: No   Sexual activity: Not on file  Other Topics Concern   Not on file  Social History Narrative   Not on file   Social Determinants of Health   Financial Resource Strain: Not on file  Food Insecurity: No Food Insecurity (06/29/2021)   Hunger Vital Sign    Worried About Running Out of Food in the Last Year: Never true    Ran Out of Food in the Last Year: Never true  Transportation Needs: No Transportation Needs (06/29/2021)   PRAPARE - Hydrologist (Medical): No    Lack of Transportation (Non-Medical): No  Physical Activity: Not on file  Stress: Not on file  Social Connections: Not on file     Family History: The patient's family history includes Heart attack in her father and sister; Heart disease in her father and mother;  Stroke in her mother.  ROS:   Please see the history of present illness.     All other systems reviewed and are negative.  EKGs/Labs/Other Studies Reviewed:    The following studies were reviewed today:   EKG:  EKG is  ordered today.  The ekg ordered today demonstrates   11/9- coarse atrial fibrillation rate controlled  Recent Labs: 05/06/2022: ALT 28; BUN 22; Creat 0.73; Hemoglobin 15.0; Magnesium 2.0; Platelets 151; Potassium 4.7; Sodium 142; TSH 4.06   Recent Lipid Panel    Component Value Date/Time   CHOL 149 05/06/2022 0000   TRIG 174 (H) 05/06/2022 0000   HDL 41 (L) 05/06/2022 0000   CHOLHDL 3.6 05/06/2022 0000   VLDL 44 (H) 01/31/2017 1010   LDLCALC 81 05/06/2022 0000     Risk Assessment/Calculations:    CHA2DS2-VASc Score = 5   This indicates a 7.2% annual risk of stroke. The patient's score is based upon: CHF History: 0 HTN History: 1 Diabetes History: 1 Stroke History: 0 Vascular Disease History: 0 Age Score: 2 Gender Score: 1               Physical Exam:    VS:  BP 134/70   Pulse 62   Ht 5' 2.5" (1.588 m)   Wt 156 lb (70.8 kg)   SpO2 93%   BMI 28.08 kg/m     Wt Readings from Last 3 Encounters:  05/23/22 156 lb (70.8 kg)  05/06/22 156 lb 3.2 oz (70.9 kg)  01/23/22 155 lb (70.3 kg)     GEN:  Well nourished, well developed in no acute distress HEENT: Normal NECK: No JVD; No carotid bruits LYMPHATICS: No lymphadenopathy CARDIAC: IRRR, no murmurs, rubs, gallops RESPIRATORY:  Clear to auscultation without rales, wheezing or rhonchi  ABDOMEN: Soft, non-tender, non-distended MUSCULOSKELETAL:  No edema; No deformity  SKIN: Warm and dry NEUROLOGIC:  Alert and oriented x 3 PSYCHIATRIC:  Normal affect   ASSESSMENT:    Paroxysmal Atrial Fibrillation: new onset. Symptomatic. Plan for rate control and DCCV. - continue eliquis 5 mg BID - continue atenolol 100 mg daily - diltiazem 240 mg daily  - DCCV 3 weeks [orders placed] -TTE  HTN:  continue olmesartan 40 mg daily and atenolol 100 mg daily  HLD: lipitor 40 mg daily PLAN:    In order of problems listed above:  TTE Eliquis 5 mg BID DCCV in 3 weeks Follow up 3 months      Shared Decision Making/Informed Consent The risks (stroke, cardiac arrhythmias rarely resulting in the need for a temporary or permanent pacemaker, skin irritation or burns and complications associated with conscious sedation including aspiration, arrhythmia, respiratory failure and death), benefits (restoration of normal sinus rhythm) and alternatives of a direct current cardioversion were explained in detail to Ms. Carlota Raspberry and she agrees to proceed.      Medication Adjustments/Labs and Tests Ordered: Current medicines are reviewed at length with the patient today.  Concerns regarding medicines are outlined above.  No orders of the defined types were placed in this encounter.  No orders of the defined types were placed in this encounter.   There are no Patient Instructions on file for this visit.   Signed, Janina Mayo, MD  05/23/2022 8:44 AM    [place]

## 2022-05-23 NOTE — H&P (View-Only) (Signed)
Cardiology Office Note:    Date:  05/23/2022   ID:  [name], DOB June 07, 1936, MRN [id number]  PCP:  Unk Pinto, [place] Providers Cardiologist:  None     Referring MD: Unk Pinto, MD   No chief complaint on file. Atrial Fibrillation  History of Present Illness:    Debra Collins is a 86 y.o. female with a hx of HTN, HLD, T2DM, referral for atrial fibrillation; ecg 05/06/2022 appears more like atrial flutter/coarse atrial fibrillation with LBBB morphology. She gets tired easily and SOB. She notes some occasional L chest discomfort.   Stroke -no hx  TSH nl  No orthopnea/PND, no LE edema  Blood pressure is normal.  Past Medical History:  Diagnosis Date   DDD (degenerative disc disease), lumbar    Fatty liver disease, nonalcoholic    CT AB 3338   Fibrocystic breast disease    Hyperlipidemia    Hypertension    Type II or unspecified type diabetes mellitus without mention of complication, not stated as uncontrolled    Vitamin D deficiency     Past Surgical History:  Procedure Laterality Date   APPENDECTOMY     CATARACT EXTRACTION, BILATERAL Bilateral 09/2017   Dr. Gershon Crane   KNEE ARTHROSCOPY Left    MASTECTOMY Bilateral 1985   Due to severe fibrocystic breast disease    Current Medications: Current Meds  Medication Sig   atenolol (TENORMIN) 100 MG tablet Take  1 tablet  Daily  for BP                                                                    /                                TAKE                          BY                    MOUTH   atorvastatin (LIPITOR) 40 MG tablet Take 1 tablet daily as directed for cholesterol.   azithromycin (ZITHROMAX) 250 MG tablet Take 2 tablets with Food on  Day 1, then 1 tablet Daily with Food for Sinusitis / Bronchitis   CALCIUM-MAGNESIUM-VITAMIN D PO Take 1 tablet by mouth daily. Contains Vitamin 6300 IU   diltiazem (CARTIA XT) 240 MG 24 hr capsule Take  1 capsule  Daily  for BP & Heart Rhythm   ( STOP VERAPAMIL )   meloxicam (MOBIC) 15 MG tablet Take 1/2 to 1 tablet daily with food for Pain / Inflammation & Limit to 5 days/week to prevent Kidney Damage   Multiple Vitamin (MULTIVITAMIN) tablet Take 1 tablet by mouth daily.   olmesartan (BENICAR) 40 MG tablet Take 1 tablet (40 mg total) by mouth daily.   omeprazole (PRILOSEC) 20 MG capsule Take  1 capsule  Daily  to Prevent Heartburn & Indigestion                                    /  TAKE                          BY                       MOUTH   vitamin B-12 (CYANOCOBALAMIN) 1000 MCG tablet Take 1,000 mcg by mouth daily.     Allergies:   Patient has no active allergies.   Social History   Socioeconomic History   Marital status: Married    Spouse name: Not on file   Number of children: Not on file   Years of education: Not on file   Highest education level: Not on file  Occupational History   Occupation: retired    Comment: insurance  Tobacco Use   Smoking status: Former    Types: Cigarettes    Quit date: 07/15/1993    Years since quitting: 28.8   Smokeless tobacco: Never  Substance and Sexual Activity   Alcohol use: No   Drug use: No   Sexual activity: Not on file  Other Topics Concern   Not on file  Social History Narrative   Not on file   Social Determinants of Health   Financial Resource Strain: Not on file  Food Insecurity: No Food Insecurity (06/29/2021)   Hunger Vital Sign    Worried About Running Out of Food in the Last Year: Never true    Ran Out of Food in the Last Year: Never true  Transportation Needs: No Transportation Needs (06/29/2021)   PRAPARE - Hydrologist (Medical): No    Lack of Transportation (Non-Medical): No  Physical Activity: Not on file  Stress: Not on file  Social Connections: Not on file     Family History: The patient's family history includes Heart attack in her father and sister; Heart disease in her father and mother;  Stroke in her mother.  ROS:   Please see the history of present illness.     All other systems reviewed and are negative.  EKGs/Labs/Other Studies Reviewed:    The following studies were reviewed today:   EKG:  EKG is  ordered today.  The ekg ordered today demonstrates   11/9- coarse atrial fibrillation rate controlled  Recent Labs: 05/06/2022: ALT 28; BUN 22; Creat 0.73; Hemoglobin 15.0; Magnesium 2.0; Platelets 151; Potassium 4.7; Sodium 142; TSH 4.06   Recent Lipid Panel    Component Value Date/Time   CHOL 149 05/06/2022 0000   TRIG 174 (H) 05/06/2022 0000   HDL 41 (L) 05/06/2022 0000   CHOLHDL 3.6 05/06/2022 0000   VLDL 44 (H) 01/31/2017 1010   LDLCALC 81 05/06/2022 0000     Risk Assessment/Calculations:    CHA2DS2-VASc Score = 5   This indicates a 7.2% annual risk of stroke. The patient's score is based upon: CHF History: 0 HTN History: 1 Diabetes History: 1 Stroke History: 0 Vascular Disease History: 0 Age Score: 2 Gender Score: 1               Physical Exam:    VS:  BP 134/70   Pulse 62   Ht 5' 2.5" (1.588 m)   Wt 156 lb (70.8 kg)   SpO2 93%   BMI 28.08 kg/m     Wt Readings from Last 3 Encounters:  05/23/22 156 lb (70.8 kg)  05/06/22 156 lb 3.2 oz (70.9 kg)  01/23/22 155 lb (70.3 kg)     GEN:  Well nourished, well developed in no acute distress HEENT: Normal NECK: No JVD; No carotid bruits LYMPHATICS: No lymphadenopathy CARDIAC: IRRR, no murmurs, rubs, gallops RESPIRATORY:  Clear to auscultation without rales, wheezing or rhonchi  ABDOMEN: Soft, non-tender, non-distended MUSCULOSKELETAL:  No edema; No deformity  SKIN: Warm and dry NEUROLOGIC:  Alert and oriented x 3 PSYCHIATRIC:  Normal affect   ASSESSMENT:    Paroxysmal Atrial Fibrillation: new onset. Symptomatic. Plan for rate control and DCCV. - continue eliquis 5 mg BID - continue atenolol 100 mg daily - diltiazem 240 mg daily  - DCCV 3 weeks [orders placed] -TTE  HTN:  continue olmesartan 40 mg daily and atenolol 100 mg daily  HLD: lipitor 40 mg daily PLAN:    In order of problems listed above:  TTE Eliquis 5 mg BID DCCV in 3 weeks Follow up 3 months      Shared Decision Making/Informed Consent The risks (stroke, cardiac arrhythmias rarely resulting in the need for a temporary or permanent pacemaker, skin irritation or burns and complications associated with conscious sedation including aspiration, arrhythmia, respiratory failure and death), benefits (restoration of normal sinus rhythm) and alternatives of a direct current cardioversion were explained in detail to Ms. Carlota Raspberry and she agrees to proceed.      Medication Adjustments/Labs and Tests Ordered: Current medicines are reviewed at length with the patient today.  Concerns regarding medicines are outlined above.  No orders of the defined types were placed in this encounter.  No orders of the defined types were placed in this encounter.   There are no Patient Instructions on file for this visit.   Signed, Janina Mayo, MD  05/23/2022 8:44 AM    [place]

## 2022-05-23 NOTE — Patient Instructions (Addendum)
Medication Instructions:  START Eliquis 5 mg two times daily  OKAY TO CONTINUE MELOXICAM AS LONG AS YOUR NOT TAKING IT CONSISTENTLY   *If you need a refill on your cardiac medications before your next appointment, please call your pharmacy*   Lab Work: Please return for labs 1 week prior to cardioversion (BMET, CBC)  Our in office lab hours are Monday-Friday 8:00-4:00, closed for lunch 12:45-1:45 pm.  No appointment needed.  [facility] locations:   [place] ([place]) - [address] [place] [address] [place] - [address] Oncologist)  Testing/Procedures: Your physician has requested that you have an echocardiogram. Echocardiography is a painless test that uses sound waves to create images of your heart. It provides your doctor with information about the size and shape of your heart and how well your heart's chambers and valves are working. This procedure takes approximately one hour. There are no restrictions for this procedure. Please do NOT wear cologne, perfume, aftershave, or lotions (deodorant is allowed). Please arrive 15 minutes prior to your appointment time.  Your physician has recommended that you have a Cardioversion (DCCV). Electrical Cardioversion uses a jolt of electricity to your heart either through paddles or wired patches attached to your chest. This is a controlled, usually prescheduled, procedure. Defibrillation is done under light anesthesia in the hospital, and you usually go home the day of the procedure. This is done to get your heart back into a normal rhythm. You are not awake for the procedure. Please see the instruction sheet given to you  today.   Follow-Up: At [facility], you and your health needs are our priority.  As part of our continuing mission to provide you with exceptional heart care, we have created designated Provider Care Teams.  These Care Teams include your primary Cardiologist (physician) and Advanced Practice Providers (APPs -  Physician Assistants and Nurse Practitioners) who all work together to provide you with the care you need, when you need it.  We recommend signing up for the patient portal called "MyChart".  Sign up information is provided on this After Visit Summary.  MyChart is used to connect with patients for Virtual Visits (Telemedicine).  Patients are able to view lab/test results, encounter notes, upcoming appointments, etc.  Non-urgent messages can be sent to your provider as well.   To learn more about what you can do with MyChart, go to [email or web address].    Your next appointment:   3 month(s)  The format for your next appointment:   In Person  Provider:   Dr. Harl Bowie  Other Instructions     Dear Debra Collins  You are scheduled for a Cardioversion on Thursday, November 30 with Dr. Harrell Gave.  Please arrive at the [facility] (Main Entrance A) at [facility]: [address] [place], [place] [address] at 7:00 AM.   DIET:  Nothing to eat or drink after midnight except a sip of water with medications (see medication instructions below)  MEDICATION INSTRUCTIONS: Continue Eliquis-if you miss a dose, please let us know.  LABS:  Please return for labs 1 week prior to  cardioversion (BMET, CBC)  Our in office lab hours are Monday-Friday 8:00-4:00, closed for lunch 12:45-1:45 pm.  No appointment needed.  [facility] locations:   [place] ([place]) - [address] [place] [address] [place] - [address] ([place])  FYI:  For your safety, and to allow [place] to monitor your vital signs accurately during the surgery/procedure we request: If you have artificial nails, gel coating, SNS etc, please have those removed prior to your surgery/procedure. Not having the nail coverings /polish removed may result in cancellation or delay of your surgery/procedure.  You must have a responsible person to drive you home and stay in the waiting area during your procedure. Failure to do so could result in cancellation.  Bring your insurance cards.  *Special Note: Every effort is made to have your procedure done on time. Occasionally there are emergencies that occur at the hospital that may cause delays. Please be patient if a delay does occur.

## 2022-06-10 DIAGNOSIS — I480 Paroxysmal atrial fibrillation: Secondary | ICD-10-CM | POA: Diagnosis not present

## 2022-06-11 LAB — BASIC METABOLIC PANEL
BUN/Creatinine Ratio: 21 (ref 12–28)
BUN: 18 mg/dL (ref 8–27)
CO2: 25 mmol/L (ref 20–29)
Calcium: 9.5 mg/dL (ref 8.7–10.3)
Chloride: 101 mmol/L (ref 96–106)
Creatinine, Ser: 0.85 mg/dL (ref 0.57–1.00)
Glucose: 109 mg/dL — ABNORMAL HIGH (ref 70–99)
Potassium: 4.3 mmol/L (ref 3.5–5.2)
Sodium: 142 mmol/L (ref 134–144)
eGFR: 68 mL/min/{1.73_m2} (ref >59)

## 2022-06-11 LAB — CBC
Hematocrit: 43.1 % (ref 34.0–46.6)
Hemoglobin: 14.6 g/dL (ref 11.1–15.9)
MCH: 32.8 pg (ref 26.6–33.0)
MCHC: 33.9 g/dL (ref 31.5–35.7)
MCV: 97 fL (ref 79–97)
Platelets: 155 10*3/uL (ref 150–450)
RBC: 4.45 x10E6/uL (ref 3.77–5.28)
RDW: 13.3 % (ref 11.7–15.4)
WBC: 6.5 10*3/uL (ref 3.4–10.8)

## 2022-06-13 ENCOUNTER — Ambulatory Visit (HOSPITAL_BASED_OUTPATIENT_CLINIC_OR_DEPARTMENT_OTHER): Payer: PPO | Admitting: Certified Registered Nurse Anesthetist

## 2022-06-13 ENCOUNTER — Ambulatory Visit (HOSPITAL_COMMUNITY)
Admission: RE | Admit: 2022-06-13 | Discharge: 2022-06-13 | Disposition: A | Discharge status: Home / Self Care | Payer: PPO | Attending: Cardiology | Admitting: Cardiology

## 2022-06-13 ENCOUNTER — Encounter (HOSPITAL_COMMUNITY)
Admission: RE | Disposition: A | Discharge status: Home / Self Care | Payer: Self-pay | Source: Home / Self Care | Attending: Cardiology

## 2022-06-13 ENCOUNTER — Other Ambulatory Visit: Payer: Self-pay

## 2022-06-13 ENCOUNTER — Ambulatory Visit (HOSPITAL_COMMUNITY): Payer: PPO | Admitting: Certified Registered Nurse Anesthetist

## 2022-06-13 ENCOUNTER — Encounter (HOSPITAL_COMMUNITY): Payer: Self-pay | Admitting: Cardiology

## 2022-06-13 DIAGNOSIS — E119 Type 2 diabetes mellitus without complications: Secondary | ICD-10-CM | POA: Insufficient documentation

## 2022-06-13 DIAGNOSIS — I480 Paroxysmal atrial fibrillation: Secondary | ICD-10-CM | POA: Insufficient documentation

## 2022-06-13 DIAGNOSIS — I4891 Unspecified atrial fibrillation: Secondary | ICD-10-CM

## 2022-06-13 DIAGNOSIS — Z791 Long term (current) use of non-steroidal anti-inflammatories (NSAID): Secondary | ICD-10-CM | POA: Diagnosis not present

## 2022-06-13 DIAGNOSIS — Z7901 Long term (current) use of anticoagulants: Secondary | ICD-10-CM | POA: Diagnosis not present

## 2022-06-13 DIAGNOSIS — I452 Bifascicular block: Secondary | ICD-10-CM | POA: Insufficient documentation

## 2022-06-13 DIAGNOSIS — R0789 Other chest pain: Secondary | ICD-10-CM | POA: Insufficient documentation

## 2022-06-13 DIAGNOSIS — E785 Hyperlipidemia, unspecified: Secondary | ICD-10-CM | POA: Diagnosis not present

## 2022-06-13 DIAGNOSIS — I4892 Unspecified atrial flutter: Secondary | ICD-10-CM | POA: Insufficient documentation

## 2022-06-13 DIAGNOSIS — K760 Fatty (change of) liver, not elsewhere classified: Secondary | ICD-10-CM | POA: Diagnosis not present

## 2022-06-13 DIAGNOSIS — I10 Essential (primary) hypertension: Secondary | ICD-10-CM | POA: Diagnosis not present

## 2022-06-13 DIAGNOSIS — I4819 Other persistent atrial fibrillation: Secondary | ICD-10-CM

## 2022-06-13 DIAGNOSIS — Z87891 Personal history of nicotine dependence: Secondary | ICD-10-CM | POA: Diagnosis not present

## 2022-06-13 HISTORY — PX: CARDIOVERSION: SHX1299

## 2022-06-13 SURGERY — CARDIOVERSION
Anesthesia: General

## 2022-06-13 MED ORDER — PROPOFOL 10 MG/ML IV BOLUS
INTRAVENOUS | Status: DC | PRN
  Administered 2022-06-13: 60 mg via INTRAVENOUS
  Administered 2022-06-13: 40 mg via INTRAVENOUS

## 2022-06-13 MED ORDER — SODIUM CHLORIDE 0.9 % IV SOLN: INTRAVENOUS | Status: DC

## 2022-06-13 MED ORDER — LIDOCAINE 2% (20 MG/ML) 5 ML SYRINGE
INTRAMUSCULAR | Status: DC | PRN
  Administered 2022-06-13: 60 mg via INTRAVENOUS

## 2022-06-13 NOTE — Transfer of Care (Signed)
Immediate Anesthesia Transfer of Care Note  Patient: Debra Collins  Procedure(s) Performed: CARDIOVERSION  Patient Location: Endoscopy Unit  Anesthesia Type:General  Level of Consciousness: drowsy and patient cooperative  Airway & Oxygen Therapy: Patient Spontanous Breathing  Post-op Assessment: Report given to RN, Post -op Vital signs reviewed and stable, and Patient moving all extremities X 4  Post vital signs: Reviewed and stable  Last Vitals:  Vitals Value Taken Time  BP 140/60   Temp    Pulse 61   Resp 14   SpO2 92     Last Pain:  Vitals:   06/13/22 0708  TempSrc: Oral  PainSc: 0-No pain         Complications: No notable events documented.

## 2022-06-13 NOTE — Interval H&P Note (Signed)
History and Physical Interval Note:  06/13/2022 7:45 AM  Debra Collins  has presented today for surgery, with the diagnosis of AFIB.  The various methods of treatment have been discussed with the patient and family. After consideration of risks, benefits and other options for treatment, the patient has consented to  Procedure(s): CARDIOVERSION (N/A) as a surgical intervention.  The patient's history has been reviewed, patient examined, no change in status, stable for surgery.  I have reviewed the patient's chart and labs.  Questions were answered to the patient's satisfaction.     Tanetta Fuhriman Harrell Gave

## 2022-06-13 NOTE — Anesthesia Preprocedure Evaluation (Signed)
Anesthesia Evaluation  Patient identified by MRN, date of birth, ID band Patient awake    Reviewed: Allergy & Precautions, H&P , NPO status , Patient's Chart, lab work & pertinent test results  Airway Mallampati: II   Neck ROM: full    Dental   Pulmonary former smoker   breath sounds clear to auscultation       Cardiovascular hypertension, + dysrhythmias Atrial Fibrillation  Rhythm:irregular Rate:Normal     Neuro/Psych  PSYCHIATRIC DISORDERS  Depression       GI/Hepatic ,GERD  ,,  Endo/Other  diabetes, Type 2    Renal/GU      Musculoskeletal  (+) Arthritis ,    Abdominal   Peds  Hematology   Anesthesia Other Findings   Reproductive/Obstetrics                             Anesthesia Physical Anesthesia Plan  ASA: 3  Anesthesia Plan: General   Post-op Pain Management:    Induction: Intravenous  PONV Risk Score and Plan: 3 and Propofol infusion and Treatment may vary due to age or medical condition  Airway Management Planned: Mask  Additional Equipment:   Intra-op Plan:   Post-operative Plan:   Informed Consent: I have reviewed the patients History and Physical, chart, labs and discussed the procedure including the risks, benefits and alternatives for the proposed anesthesia with the patient or authorized representative who has indicated his/her understanding and acceptance.     Dental advisory given  Plan Discussed with: CRNA, Anesthesiologist and Surgeon  Anesthesia Plan Comments:        Anesthesia Quick Evaluation

## 2022-06-13 NOTE — CV Procedure (Signed)
Procedure:   DCCV  Indication:  Symptomatic atrial fibrillation/flutter  Procedure Note:  The patient signed informed consent.  They have had had therapeutic anticoagulation with apixaban greater than 3 weeks.  Anesthesia was administered by Dr. Marcie Bal.  Patient received 60 mg IV lidocaine and 100 mg IV propofol.Adequate airway was maintained throughout and vital followed per protocol.  They were cardioverted x 3 with 120, 150, 200 J of biphasic synchronized energy with anterior pressure.  They converted to NSR.  There were no apparent complications.  The patient had normal neuro status and respiratory status post procedure with vitals stable as recorded elsewhere.    Follow up:  They will continue on current medical therapy and follow up with cardiology as scheduled.  Buford Dresser, MD PhD 06/13/2022 8:02 AM

## 2022-06-13 NOTE — Anesthesia Postprocedure Evaluation (Signed)
Anesthesia Post Note  Patient: Debra Collins  Procedure(s) Performed: CARDIOVERSION     Patient location during evaluation: Endoscopy Anesthesia Type: General Level of consciousness: awake and alert Pain management: pain level controlled Vital Signs Assessment: post-procedure vital signs reviewed and stable Respiratory status: spontaneous breathing, nonlabored ventilation, respiratory function stable and patient connected to nasal cannula oxygen Cardiovascular status: blood pressure returned to baseline and stable Postop Assessment: no apparent nausea or vomiting Anesthetic complications: no   No notable events documented.  Last Vitals:  Vitals:   06/13/22 0820 06/13/22 0830  BP: 125/88 (!) 159/85  Pulse: (!) 59 (!) 58  Resp: 16 14  Temp:    SpO2: 93% 95%    Last Pain:  Vitals:   06/13/22 0830  TempSrc:   PainSc: 0-No pain                 Mattisen Pohlmann S

## 2022-06-14 ENCOUNTER — Encounter (HOSPITAL_COMMUNITY): Payer: Self-pay | Admitting: Cardiology

## 2022-06-17 ENCOUNTER — Telehealth: Payer: Self-pay | Admitting: Internal Medicine

## 2022-06-17 NOTE — Telephone Encounter (Signed)
Janina Mayo, MD  You; Clinton Gallant, Belinda Block, [place] minutes ago (4:43 PM)   MB Please arrange for next available DOD visit. Thank you!   Called pt back with Dr. Nelly Laurence recommendations. DOD appointment scheduled with Dr. Sallyanne Kuster on 12/7 at 10:30am. Pt is able to make appointment. Pt verbalizes understanding.

## 2022-06-17 NOTE — Telephone Encounter (Signed)
Spoke with pt regarding her recent cardioversion on Thursday, 11/30. Pt states that they were successful and she felt good on both Thursday and Friday but on Saturday she noticed that she felt similar to the way she does when she is in a-fib. Pt used her cardio mobile and it did confirm that she maybe in a-fib. Pt states that her heart rate has been in the 60s. Blood pressure this morning was 137/70. However, pt does feel tired and lethargic. Will route to Dr. Harl Bowie to advise. Pt verbalizes understanding.

## 2022-06-17 NOTE — Telephone Encounter (Signed)
Pt is requesting call back after cardioversion. She states that she is feeling the same as before the cardioversion and lethargic. Requesting to speak to someone in regards to this.

## 2022-06-20 ENCOUNTER — Ambulatory Visit: Payer: PPO | Attending: Cardiovascular Disease | Admitting: Cardiovascular Disease

## 2022-06-20 ENCOUNTER — Encounter: Payer: Self-pay | Admitting: Cardiovascular Disease

## 2022-06-20 VITALS — BP 127/61 | HR 64 | Ht 62.5 in | Wt 160.6 lb

## 2022-06-20 DIAGNOSIS — R0609 Other forms of dyspnea: Secondary | ICD-10-CM

## 2022-06-20 DIAGNOSIS — I4819 Other persistent atrial fibrillation: Secondary | ICD-10-CM

## 2022-06-20 DIAGNOSIS — E785 Hyperlipidemia, unspecified: Secondary | ICD-10-CM | POA: Diagnosis not present

## 2022-06-20 DIAGNOSIS — I10 Essential (primary) hypertension: Secondary | ICD-10-CM | POA: Diagnosis not present

## 2022-06-20 DIAGNOSIS — D6869 Other thrombophilia: Secondary | ICD-10-CM | POA: Diagnosis not present

## 2022-06-20 DIAGNOSIS — E1122 Type 2 diabetes mellitus with diabetic chronic kidney disease: Secondary | ICD-10-CM | POA: Diagnosis not present

## 2022-06-20 DIAGNOSIS — E1169 Type 2 diabetes mellitus with other specified complication: Secondary | ICD-10-CM | POA: Diagnosis not present

## 2022-06-20 DIAGNOSIS — N182 Chronic kidney disease, stage 2 (mild): Secondary | ICD-10-CM

## 2022-06-20 NOTE — Progress Notes (Signed)
Cardiology Office Note:    Date:  06/20/2022   ID:  Debra Collins, DOB 1936-06-21, MRN [id number]  PCP:  Unk Pinto, [place] Providers Cardiologist:  None     Referring MD: Unk Pinto, MD   Chief Complaint  Patient presents with   Atrial Fibrillation    History of Present Illness:    Debra Collins is a 86 y.o. female with a hx of recent onset persistent atrial fibrillation who underwent cardioversion last week.  She felt great for about 2 days following the cardioversion, subsequently has fatigue and dyspnea again.  Her ECG today shows that she is back in atrial fibrillation with controlled ventricular response.  She is anticoagulated with Eliquis and has not had any interruptions in therapy or falls or bleeding problems.  Rate control was achieved with a combination of atenolol 100 mg daily and diltiazem 240 mg daily.  She does not have edema, orthopnea or PND and has never complained of angina at rest or with activity.  She is not aware of palpitations.  She denies dizziness or syncope.  She has never had a TIA or stroke.  She has a history of diet-controlled diabetes mellitus, treated hypertension and treated hypercholesterolemia, but does not have a history of CAD or PAD.  She had a treadmill stress test at least 30 years ago that was normal, but no subsequent evaluation for CAD.  She does not smoke.  Her father had a myocardial infarction and died around age 70.  She has an echocardiogram scheduled for 06/26/2022.  Past Medical History:  Diagnosis Date   DDD (degenerative disc disease), lumbar    Fatty liver disease, nonalcoholic    CT AB 1829   Fibrocystic breast disease    Hyperlipidemia    Hypertension    Type II or unspecified type diabetes mellitus without mention of complication, not stated as uncontrolled    Vitamin D deficiency     Past Surgical History:  Procedure Laterality Date   APPENDECTOMY     CARDIOVERSION N/A 06/13/2022    Procedure: CARDIOVERSION;  Surgeon: Buford Dresser, MD;  [facility] ENDOSCOPY;  Service: Cardiovascular;  Laterality: N/A;   CATARACT EXTRACTION, BILATERAL Bilateral 09/2017   Dr. Gershon Crane   KNEE ARTHROSCOPY Left    MASTECTOMY Bilateral 1985   Due to severe fibrocystic breast disease    Current Medications: Current Meds  Medication Sig   apixaban (ELIQUIS) 5 MG TABS tablet Take 1 tablet (5 mg total) by mouth 2 (two) times daily.   atenolol (TENORMIN) 100 MG tablet Take  1 tablet  Daily  for BP                                                                    /                                TAKE                          BY                    MOUTH (Patient taking differently:  Take 50 mg by mouth every evening.)   atorvastatin (LIPITOR) 40 MG tablet Take 1 tablet daily as directed for cholesterol. (Patient taking differently: Take 40 mg by mouth every evening.)   Brimonidine Tartrate (LUMIFY) 0.025 % SOLN Place 1 drop into both eyes 2 (two) times daily as needed (eye redness/irritation.).   buPROPion (WELLBUTRIN XL) 150 MG 24 hr tablet Take 150 mg by mouth in the morning.   Cholecalciferol (VITAMIN D-3 PO) Take 1 tablet by mouth in the morning.   diltiazem (CARTIA XT) 240 MG 24 hr capsule Take  1 capsule  Daily  for BP & Heart Rhythm  ( STOP VERAPAMIL ) (Patient taking differently: Take 240 mg by mouth every evening.)   meloxicam (MOBIC) 15 MG tablet Take 1/2 to 1 tablet daily with food for Pain / Inflammation & Limit to 5 days/week to prevent Kidney Damage (Patient taking differently: Take 15 mg by mouth every Monday, Wednesday, and Friday.)   Multiple Vitamin (MULTIVITAMIN WITH MINERALS) TABS tablet Take 1 tablet by mouth in the morning.   olmesartan (BENICAR) 40 MG tablet Take 1 tablet (40 mg total) by mouth daily.   omeprazole (PRILOSEC) 20 MG capsule Take  1 capsule  Daily  to Prevent Heartburn & Indigestion                                    /                                 TAKE                          BY                       MOUTH   vitamin B-12 (CYANOCOBALAMIN) 1000 MCG tablet Take 1,000 mcg by mouth daily.     Allergies:   Patient has no known allergies.   Social History   Socioeconomic History   Marital status: Married    Spouse name: Not on file   Number of children: Not on file   Years of education: Not on file   Highest education level: Not on file  Occupational History   Occupation: retired    Comment: insurance  Tobacco Use   Smoking status: Former    Types: Cigarettes    Quit date: 07/15/1993    Years since quitting: 28.9   Smokeless tobacco: Never  Substance and Sexual Activity   Alcohol use: No   Drug use: No   Sexual activity: Not on file  Other Topics Concern   Not on file  Social History Narrative   Not on file   Social Determinants of Health   Financial Resource Strain: Not on file  Food Insecurity: No Food Insecurity (06/29/2021)   Hunger Vital Sign    Worried About Running Out of Food in the Last Year: Never true    Ran Out of Food in the Last Year: Never true  Transportation Needs: No Transportation Needs (06/29/2021)   PRAPARE - Hydrologist (Medical): No    Lack of Transportation (Non-Medical): No  Physical Activity: Not on file  Stress: Not on file  Social Connections: Not on file     Family History: The patient's family history includes Heart attack  in her father and sister; Heart disease in her father and mother; Stroke in her mother.  ROS:   Please see the history of present illness.     All other systems reviewed and are negative.  EKGs/Labs/Other Studies Reviewed:    The following studies were reviewed today:   EKG:  EKG is  ordered today.  The ekg ordered today demonstrates atrial fibrillation with controlled ventricular response.  The fibrillatory waves are quite coarse almost looking like atrial flutter.  There is a nonspecific intraventricular conduction delay, most  closely resembling a right bundle branch block (QRS 136 ms, prolonged QTc 501 ms may not be accurate due to the course fibrillation waves.  However, QT interval was also lengthy on the post cardioversion ECG from 06/13/2022 (QTc 500 ms)  Recent Labs: 05/06/2022: ALT 28; Magnesium 2.0; TSH 4.06 06/10/2022: BUN 18; Creatinine, Ser 0.85; Hemoglobin 14.6; Platelets 155; Potassium 4.3; Sodium 142  Recent Lipid Panel    Component Value Date/Time   CHOL 149 05/06/2022 0000   TRIG 174 (H) 05/06/2022 0000   HDL 41 (L) 05/06/2022 0000   CHOLHDL 3.6 05/06/2022 0000   VLDL 44 (H) 01/31/2017 1010   LDLCALC 81 05/06/2022 0000     Risk Assessment/Calculations:    CHA2DS2-VASc Score = 5   This indicates a 7.2% annual risk of stroke. The patient's score is based upon: CHF History: 0 HTN History: 1 Diabetes History: 1 Stroke History: 0 Vascular Disease History: 0 Age Score: 2 Gender Score: 1               Physical Exam:    VS:  BP 127/61   Pulse 64   Ht 5' 2.5" (1.588 m)   Wt 72.8 kg   SpO2 95%   BMI 28.91 kg/m     Wt Readings from Last 3 Encounters:  06/20/22 72.8 kg  06/13/22 68 kg  05/23/22 70.8 kg     GEN: Mildly overweight, well nourished, well developed in no acute distress HEENT: Normal NECK: No JVD; No carotid bruits LYMPHATICS: No lymphadenopathy CARDIAC: Irregular , no murmurs, rubs, gallops RESPIRATORY:  Clear to auscultation without rales, wheezing or rhonchi  ABDOMEN: Soft, non-tender, non-distended MUSCULOSKELETAL:  No edema; No deformity  SKIN: Warm and dry NEUROLOGIC:  Alert and oriented x 3 PSYCHIATRIC:  Normal affect   ASSESSMENT:    1. Dyspnea on exertion   2. Persistent atrial fibrillation ([place])   3. Acquired thrombophilia ([place])   4. Type 2 diabetes mellitus with stage 2 chronic kidney disease, without long-term current use of insulin ([place])   5. Essential hypertension   6. Hyperlipidemia associated with type 2 diabetes mellitus ([place])     PLAN:    In order of problems listed above:  AFib: She is symptomatic despite having good ventricular rate control.  Felt much better after cardioversion, but had early recurrence of atrial fibrillation.  She will need some type of antiarrhythmic strategy to return to normal rhythm.  She is appropriately anticoagulated.  Discussed options for medical therapy versus radiofrequency ablation.  Medical therapy is probably the best initial choice in view of her age.  I doubt Multaq would be effective since she has persistent arrhythmia.  Flecainide may work well for her, but need to make sure she does not have coronary artery disease so we will order a Lexiscan Myoview first.  Probably not a good dofetilide candidate since her QT interval was lengthy.  Amiodarone could be an option, but would leave that  as a later option if she fails the other antiarrhythmics.  Discussed all these options.  If the [facility] is normal will prescribe flecainide.  If she remains in atrial fibrillation after about a week of flecainide, would then perform another electrical cardioversion. Anticoagulation: So far well-tolerated without bleeding problems.  Important not to miss any anticoagulant doses since we will likely need to perform another cardioversion.  Taking NSAIDs cautiously (every other day) due to risk of bleeding.  Current dose of Eliquis is appropriate despite advanced age, due to normal renal function and weight. DM: Most recent hemoglobin A1c 6.8% without medication. HTN: Well-controlled on current meds. HLP: In the absence of CAD or PAD her current lipid profile on atorvastatin is acceptable.           Medication Adjustments/Labs and Tests Ordered: Current medicines are reviewed at length with the patient today.  Concerns regarding medicines are outlined above.  Orders Placed This Encounter  Procedures   Cardiac Stress Test: Informed Consent Details: Physician/Practitioner Attestation; Transcribe  to consent form and obtain patient signature   MYOCARDIAL PERFUSION IMAGING   EKG 12-Lead   No orders of the defined types were placed in this encounter.   Patient Instructions  Medication Instructions:  No Changes In Medications at this time.  *If you need a refill on your cardiac medications before your next appointment, please call your pharmacy*  Lab Work: None Ordered At This Time.  If you have labs (blood work) drawn today and your tests are completely normal, you will receive your results only by: [place] (if you have MyChart) OR A paper copy in the mail If you have any lab test that is abnormal or we need to change your treatment, we will call you to review the results.  Testing/Procedures: Your physician has requested that you have a lexiscan myoview. For further information please visit [email or web address]. Please follow instruction sheet, as given. This will take place at [address]  Follow-Up: At [facility], you and your health needs are our priority.  As part of our continuing mission to provide you with exceptional heart care, we have created designated Provider Care Teams.  These Care Teams include your primary Cardiologist (physician) and Advanced Practice Providers (APPs -  Physician Assistants and Nurse Practitioners) who all work together to provide you with the care you need, when you need it.  Your next appointment:   2 week(s) WITH ATRIAL FIB CLINIC   THEN- 2 MONTHS WITH MD   The format for your next appointment:   In Person  Provider:   Dr. Harl Bowie     Signed, Sanda Klein, MD  06/20/2022 11:22 AM    [place]

## 2022-06-20 NOTE — H&P (View-Only) (Signed)
Cardiology Office Note:    Date:  06/20/2022   ID:  Debra Collins, DOB 1935-09-26, MRN [id number]  PCP:  Unk Pinto, [place] Providers Cardiologist:  None     Referring MD: Unk Pinto, MD   Chief Complaint  Patient presents with   Atrial Fibrillation    History of Present Illness:    Debra Collins is a 86 y.o. female with a hx of recent onset persistent atrial fibrillation who underwent cardioversion last week.  She felt great for about 2 days following the cardioversion, subsequently has fatigue and dyspnea again.  Her ECG today shows that she is back in atrial fibrillation with controlled ventricular response.  She is anticoagulated with Eliquis and has not had any interruptions in therapy or falls or bleeding problems.  Rate control was achieved with a combination of atenolol 100 mg daily and diltiazem 240 mg daily.  She does not have edema, orthopnea or PND and has never complained of angina at rest or with activity.  She is not aware of palpitations.  She denies dizziness or syncope.  She has never had a TIA or stroke.  She has a history of diet-controlled diabetes mellitus, treated hypertension and treated hypercholesterolemia, but does not have a history of CAD or PAD.  She had a treadmill stress test at least 30 years ago that was normal, but no subsequent evaluation for CAD.  She does not smoke.  Her father had a myocardial infarction and died around age 69.  She has an echocardiogram scheduled for 06/26/2022.  Past Medical History:  Diagnosis Date   DDD (degenerative disc disease), lumbar    Fatty liver disease, nonalcoholic    CT AB 2202   Fibrocystic breast disease    Hyperlipidemia    Hypertension    Type II or unspecified type diabetes mellitus without mention of complication, not stated as uncontrolled    Vitamin D deficiency     Past Surgical History:  Procedure Laterality Date   APPENDECTOMY     CARDIOVERSION N/A 06/13/2022    Procedure: CARDIOVERSION;  Surgeon: Buford Dresser, MD;  Location: [facility] ENDOSCOPY;  Service: Cardiovascular;  Laterality: N/A;   CATARACT EXTRACTION, BILATERAL Bilateral 09/2017   Dr. Gershon Crane   KNEE ARTHROSCOPY Left    MASTECTOMY Bilateral 1985   Due to severe fibrocystic breast disease    Current Medications: Current Meds  Medication Sig   apixaban (ELIQUIS) 5 MG TABS tablet Take 1 tablet (5 mg total) by mouth 2 (two) times daily.   atenolol (TENORMIN) 100 MG tablet Take  1 tablet  Daily  for BP                                                                    /                                TAKE                          BY                    MOUTH (Patient taking differently:  Take 50 mg by mouth every evening.)   atorvastatin (LIPITOR) 40 MG tablet Take 1 tablet daily as directed for cholesterol. (Patient taking differently: Take 40 mg by mouth every evening.)   Brimonidine Tartrate (LUMIFY) 0.025 % SOLN Place 1 drop into both eyes 2 (two) times daily as needed (eye redness/irritation.).   buPROPion (WELLBUTRIN XL) 150 MG 24 hr tablet Take 150 mg by mouth in the morning.   Cholecalciferol (VITAMIN D-3 PO) Take 1 tablet by mouth in the morning.   diltiazem (CARTIA XT) 240 MG 24 hr capsule Take  1 capsule  Daily  for BP & Heart Rhythm  ( STOP VERAPAMIL ) (Patient taking differently: Take 240 mg by mouth every evening.)   meloxicam (MOBIC) 15 MG tablet Take 1/2 to 1 tablet daily with food for Pain / Inflammation & Limit to 5 days/week to prevent Kidney Damage (Patient taking differently: Take 15 mg by mouth every Monday, Wednesday, and Friday.)   Multiple Vitamin (MULTIVITAMIN WITH MINERALS) TABS tablet Take 1 tablet by mouth in the morning.   olmesartan (BENICAR) 40 MG tablet Take 1 tablet (40 mg total) by mouth daily.   omeprazole (PRILOSEC) 20 MG capsule Take  1 capsule  Daily  to Prevent Heartburn & Indigestion                                    /                                 TAKE                          BY                       MOUTH   vitamin B-12 (CYANOCOBALAMIN) 1000 MCG tablet Take 1,000 mcg by mouth daily.     Allergies:   Patient has no known allergies.   Social History   Socioeconomic History   Marital status: Married    Spouse name: Not on file   Number of children: Not on file   Years of education: Not on file   Highest education level: Not on file  Occupational History   Occupation: retired    Comment: insurance  Tobacco Use   Smoking status: Former    Types: Cigarettes    Quit date: 07/15/1993    Years since quitting: 28.9   Smokeless tobacco: Never  Substance and Sexual Activity   Alcohol use: No   Drug use: No   Sexual activity: Not on file  Other Topics Concern   Not on file  Social History Narrative   Not on file   Social Determinants of Health   Financial Resource Strain: Not on file  Food Insecurity: No Food Insecurity (06/29/2021)   Hunger Vital Sign    Worried About Running Out of Food in the Last Year: Never true    Ran Out of Food in the Last Year: Never true  Transportation Needs: No Transportation Needs (06/29/2021)   PRAPARE - Hydrologist (Medical): No    Lack of Transportation (Non-Medical): No  Physical Activity: Not on file  Stress: Not on file  Social Connections: Not on file     Family History: The patient's family history includes Heart attack  in her father and sister; Heart disease in her father and mother; Stroke in her mother.  ROS:   Please see the history of present illness.     All other systems reviewed and are negative.  EKGs/Labs/Other Studies Reviewed:    The following studies were reviewed today:   EKG:  EKG is  ordered today.  The ekg ordered today demonstrates atrial fibrillation with controlled ventricular response.  The fibrillatory waves are quite coarse almost looking like atrial flutter.  There is a nonspecific intraventricular conduction delay, most  closely resembling a right bundle branch block (QRS 136 ms, prolonged QTc 501 ms may not be accurate due to the course fibrillation waves.  However, QT interval was also lengthy on the post cardioversion ECG from 06/13/2022 (QTc 500 ms)  Recent Labs: 05/06/2022: ALT 28; Magnesium 2.0; TSH 4.06 06/10/2022: BUN 18; Creatinine, Ser 0.85; Hemoglobin 14.6; Platelets 155; Potassium 4.3; Sodium 142  Recent Lipid Panel    Component Value Date/Time   CHOL 149 05/06/2022 0000   TRIG 174 (H) 05/06/2022 0000   HDL 41 (L) 05/06/2022 0000   CHOLHDL 3.6 05/06/2022 0000   VLDL 44 (H) 01/31/2017 1010   LDLCALC 81 05/06/2022 0000     Risk Assessment/Calculations:    CHA2DS2-VASc Score = 5   This indicates a 7.2% annual risk of stroke. The patient's score is based upon: CHF History: 0 HTN History: 1 Diabetes History: 1 Stroke History: 0 Vascular Disease History: 0 Age Score: 2 Gender Score: 1               Physical Exam:    VS:  BP 127/61   Pulse 64   Ht 5' 2.5" (1.588 m)   Wt 72.8 kg   SpO2 95%   BMI 28.91 kg/m     Wt Readings from Last 3 Encounters:  06/20/22 72.8 kg  06/13/22 68 kg  05/23/22 70.8 kg     GEN: Mildly overweight, well nourished, well developed in no acute distress HEENT: Normal NECK: No JVD; No carotid bruits LYMPHATICS: No lymphadenopathy CARDIAC: Irregular , no murmurs, rubs, gallops RESPIRATORY:  Clear to auscultation without rales, wheezing or rhonchi  ABDOMEN: Soft, non-tender, non-distended MUSCULOSKELETAL:  No edema; No deformity  SKIN: Warm and dry NEUROLOGIC:  Alert and oriented x 3 PSYCHIATRIC:  Normal affect   ASSESSMENT:    1. Dyspnea on exertion   2. Persistent atrial fibrillation ([place])   3. Acquired thrombophilia ([place])   4. Type 2 diabetes mellitus with stage 2 chronic kidney disease, without long-term current use of insulin ([place])   5. Essential hypertension   6. Hyperlipidemia associated with type 2 diabetes mellitus ([place])     PLAN:    In order of problems listed above:  AFib: She is symptomatic despite having good ventricular rate control.  Felt much better after cardioversion, but had early recurrence of atrial fibrillation.  She will need some type of antiarrhythmic strategy to return to normal rhythm.  She is appropriately anticoagulated.  Discussed options for medical therapy versus radiofrequency ablation.  Medical therapy is probably the best initial choice in view of her age.  I doubt Multaq would be effective since she has persistent arrhythmia.  Flecainide may work well for her, but need to make sure she does not have coronary artery disease so we will order a Lexiscan Myoview first.  Probably not a good dofetilide candidate since her QT interval was lengthy.  Amiodarone could be an option, but would leave that  as a later option if she fails the other antiarrhythmics.  Discussed all these options.  If the [facility] is normal will prescribe flecainide.  If she remains in atrial fibrillation after about a week of flecainide, would then perform another electrical cardioversion. Anticoagulation: So far well-tolerated without bleeding problems.  Important not to miss any anticoagulant doses since we will likely need to perform another cardioversion.  Taking NSAIDs cautiously (every other day) due to risk of bleeding.  Current dose of Eliquis is appropriate despite advanced age, due to normal renal function and weight. DM: Most recent hemoglobin A1c 6.8% without medication. HTN: Well-controlled on current meds. HLP: In the absence of CAD or PAD her current lipid profile on atorvastatin is acceptable.           Medication Adjustments/Labs and Tests Ordered: Current medicines are reviewed at length with the patient today.  Concerns regarding medicines are outlined above.  Orders Placed This Encounter  Procedures   Cardiac Stress Test: Informed Consent Details: Physician/Practitioner Attestation; Transcribe  to consent form and obtain patient signature   MYOCARDIAL PERFUSION IMAGING   EKG 12-Lead   No orders of the defined types were placed in this encounter.   Patient Instructions  Medication Instructions:  No Changes In Medications at this time.  *If you need a refill on your cardiac medications before your next appointment, please call your pharmacy*  Lab Work: None Ordered At This Time.  If you have labs (blood work) drawn today and your tests are completely normal, you will receive your results only by: [place] (if you have MyChart) OR A paper copy in the mail If you have any lab test that is abnormal or we need to change your treatment, we will call you to review the results.  Testing/Procedures: Your physician has requested that you have a lexiscan myoview. For further information please visit [email or web address]. Please follow instruction sheet, as given. This will take place at [address]  Follow-Up: At [facility], you and your health needs are our priority.  As part of our continuing mission to provide you with exceptional heart care, we have created designated Provider Care Teams.  These Care Teams include your primary Cardiologist (physician) and Advanced Practice Providers (APPs -  Physician Assistants and Nurse Practitioners) who all work together to provide you with the care you need, when you need it.  Your next appointment:   2 week(s) WITH [facility]IAL [facility]   THEN- 2 MONTHS WITH MD   The format for your next appointment:   In Person  Provider:   Dr. Harl Bowie     Signed, Sanda Klein, MD  06/20/2022 11:22 AM    [place]

## 2022-06-20 NOTE — Patient Instructions (Signed)
Medication Instructions:  No Changes In Medications at this time.  *If you need a refill on your cardiac medications before your next appointment, please call your pharmacy*  Lab Work: None Ordered At This Time.  If you have labs (blood work) drawn today and your tests are completely normal, you will receive your results only by: [place] (if you have MyChart) OR A paper copy in the mail If you have any lab test that is abnormal or we need to change your treatment, we will call you to review the results.  Testing/Procedures: Your physician has requested that you have a lexiscan myoview. For further information please visit [email or web address]. Please follow instruction sheet, as given. This will take place at [address]  Follow-Up: At [facility], you and your health needs are our priority.  As part of our continuing mission to provide you with exceptional heart care, we have created designated Provider Care Teams.  These Care Teams include your primary Cardiologist (physician) and Advanced Practice Providers (APPs -  Physician Assistants and Nurse Practitioners) who all work together to provide you with the care you need, when you need it.  Your next appointment:   2 week(s) WITH [facility]IAL [facility] CLINIC   THEN- 2 MONTHS WITH MD   The format for your next appointment:   In Person  Provider:   Dr. Harl Bowie

## 2022-06-24 ENCOUNTER — Other Ambulatory Visit: Payer: Self-pay | Admitting: Internal Medicine

## 2022-06-24 MED ORDER — OLMESARTAN MEDOXOMIL 40 MG PO TABS: ORAL_TABLET | ORAL | 3 refills | Status: DC

## 2022-06-25 ENCOUNTER — Ambulatory Visit (HOSPITAL_COMMUNITY): Payer: PPO | Attending: Cardiology

## 2022-06-25 DIAGNOSIS — I4819 Other persistent atrial fibrillation: Secondary | ICD-10-CM | POA: Diagnosis not present

## 2022-06-25 DIAGNOSIS — R0609 Other forms of dyspnea: Secondary | ICD-10-CM

## 2022-06-25 LAB — MYOCARDIAL PERFUSION IMAGING
Base ST Depression (mm): 0 mm
LV dias vol: 62 mL (ref 46–106)
LV sys vol: 20 mL
Nuc Stress EF: 67 %
Peak HR: 65 {beats}/min
Rest HR: 62 {beats}/min
Rest Nuclear Isotope Dose: 10.5 mCi
SDS: 2
SRS: 0
SSS: 2
ST Depression (mm): 0 mm
Stress Nuclear Isotope Dose: 31.4 mCi
TID: 1.02

## 2022-06-25 MED ORDER — REGADENOSON 0.4 MG/5ML IV SOLN
0.4000 mg | Freq: Once | INTRAVENOUS | Status: AC
  Administered 2022-06-25: 0.4 mg via INTRAVENOUS

## 2022-06-25 MED ORDER — TECHNETIUM TC 99M TETROFOSMIN IV KIT
31.4000 | PACK | Freq: Once | INTRAVENOUS | Status: AC | PRN
  Administered 2022-06-25: 31.4 via INTRAVENOUS

## 2022-06-25 MED ORDER — TECHNETIUM TC 99M TETROFOSMIN IV KIT
10.5000 | PACK | Freq: Once | INTRAVENOUS | Status: AC | PRN
  Administered 2022-06-25: 10.5 via INTRAVENOUS

## 2022-06-26 ENCOUNTER — Other Ambulatory Visit: Payer: Self-pay

## 2022-06-26 ENCOUNTER — Ambulatory Visit (HOSPITAL_COMMUNITY): Payer: PPO | Attending: Cardiology

## 2022-06-26 DIAGNOSIS — Z87891 Personal history of nicotine dependence: Secondary | ICD-10-CM | POA: Diagnosis not present

## 2022-06-26 DIAGNOSIS — E785 Hyperlipidemia, unspecified: Secondary | ICD-10-CM | POA: Insufficient documentation

## 2022-06-26 DIAGNOSIS — I480 Paroxysmal atrial fibrillation: Secondary | ICD-10-CM | POA: Diagnosis not present

## 2022-06-26 DIAGNOSIS — R5383 Other fatigue: Secondary | ICD-10-CM | POA: Diagnosis not present

## 2022-06-26 DIAGNOSIS — E118 Type 2 diabetes mellitus with unspecified complications: Secondary | ICD-10-CM | POA: Insufficient documentation

## 2022-06-26 DIAGNOSIS — I447 Left bundle-branch block, unspecified: Secondary | ICD-10-CM | POA: Diagnosis not present

## 2022-06-26 DIAGNOSIS — I10 Essential (primary) hypertension: Secondary | ICD-10-CM | POA: Insufficient documentation

## 2022-06-26 DIAGNOSIS — R0602 Shortness of breath: Secondary | ICD-10-CM | POA: Diagnosis not present

## 2022-06-26 DIAGNOSIS — I700 Atherosclerosis of aorta: Secondary | ICD-10-CM | POA: Diagnosis not present

## 2022-06-26 DIAGNOSIS — Z8673 Personal history of transient ischemic attack (TIA), and cerebral infarction without residual deficits: Secondary | ICD-10-CM | POA: Diagnosis not present

## 2022-06-26 LAB — ECHOCARDIOGRAM COMPLETE: S' Lateral: 1.6 cm

## 2022-06-26 MED ORDER — FLECAINIDE ACETATE 50 MG PO TABS: 50.0000 mg | ORAL_TABLET | Freq: Two times a day (BID) | ORAL | 3 refills | Status: DC

## 2022-06-28 ENCOUNTER — Other Ambulatory Visit: Payer: Self-pay

## 2022-06-28 DIAGNOSIS — Z1211 Encounter for screening for malignant neoplasm of colon: Secondary | ICD-10-CM

## 2022-06-28 LAB — POC HEMOCCULT BLD/STL (HOME/3-CARD/SCREEN)
Card #2 Fecal Occult Blod, POC: NEGATIVE
Card #3 Fecal Occult Blood, POC: NEGATIVE
Fecal Occult Blood, POC: NEGATIVE

## 2022-07-01 DIAGNOSIS — Z1212 Encounter for screening for malignant neoplasm of rectum: Secondary | ICD-10-CM

## 2022-07-01 DIAGNOSIS — Z1211 Encounter for screening for malignant neoplasm of colon: Secondary | ICD-10-CM

## 2022-07-04 ENCOUNTER — Encounter (HOSPITAL_COMMUNITY)
Admission: EM | Disposition: A | Discharge status: Home / Self Care | Payer: Self-pay | Source: Home / Self Care | Attending: Cardiology

## 2022-07-04 ENCOUNTER — Emergency Department (HOSPITAL_COMMUNITY): Payer: PPO

## 2022-07-04 ENCOUNTER — Ambulatory Visit (HOSPITAL_COMMUNITY): Payer: PPO | Admitting: Physician Assistant

## 2022-07-04 ENCOUNTER — Inpatient Hospital Stay (HOSPITAL_COMMUNITY)
Admission: EM | Admit: 2022-07-04 | Discharge: 2022-07-06 | DRG: 261 | Disposition: A | Discharge status: Home / Self Care | Payer: PPO | Attending: Cardiology | Admitting: Cardiology

## 2022-07-04 ENCOUNTER — Other Ambulatory Visit: Payer: Self-pay

## 2022-07-04 ENCOUNTER — Inpatient Hospital Stay (HOSPITAL_COMMUNITY): Payer: PPO

## 2022-07-04 ENCOUNTER — Encounter (HOSPITAL_COMMUNITY): Payer: Self-pay

## 2022-07-04 DIAGNOSIS — E785 Hyperlipidemia, unspecified: Secondary | ICD-10-CM | POA: Diagnosis present

## 2022-07-04 DIAGNOSIS — S0993XA Unspecified injury of face, initial encounter: Secondary | ICD-10-CM | POA: Diagnosis not present

## 2022-07-04 DIAGNOSIS — Z8249 Family history of ischemic heart disease and other diseases of the circulatory system: Secondary | ICD-10-CM | POA: Diagnosis not present

## 2022-07-04 DIAGNOSIS — Z79899 Other long term (current) drug therapy: Secondary | ICD-10-CM | POA: Diagnosis not present

## 2022-07-04 DIAGNOSIS — Z87891 Personal history of nicotine dependence: Secondary | ICD-10-CM | POA: Diagnosis not present

## 2022-07-04 DIAGNOSIS — J9811 Atelectasis: Secondary | ICD-10-CM | POA: Diagnosis not present

## 2022-07-04 DIAGNOSIS — I4819 Other persistent atrial fibrillation: Secondary | ICD-10-CM | POA: Diagnosis not present

## 2022-07-04 DIAGNOSIS — Z791 Long term (current) use of non-steroidal anti-inflammatories (NSAID): Secondary | ICD-10-CM

## 2022-07-04 DIAGNOSIS — Z9842 Cataract extraction status, left eye: Secondary | ICD-10-CM

## 2022-07-04 DIAGNOSIS — I10 Essential (primary) hypertension: Secondary | ICD-10-CM | POA: Diagnosis present

## 2022-07-04 DIAGNOSIS — E1122 Type 2 diabetes mellitus with diabetic chronic kidney disease: Secondary | ICD-10-CM | POA: Diagnosis present

## 2022-07-04 DIAGNOSIS — R001 Bradycardia, unspecified: Secondary | ICD-10-CM | POA: Diagnosis not present

## 2022-07-04 DIAGNOSIS — E875 Hyperkalemia: Secondary | ICD-10-CM | POA: Diagnosis not present

## 2022-07-04 DIAGNOSIS — E119 Type 2 diabetes mellitus without complications: Secondary | ICD-10-CM

## 2022-07-04 DIAGNOSIS — E559 Vitamin D deficiency, unspecified: Secondary | ICD-10-CM | POA: Diagnosis not present

## 2022-07-04 DIAGNOSIS — S0990XA Unspecified injury of head, initial encounter: Secondary | ICD-10-CM | POA: Diagnosis not present

## 2022-07-04 DIAGNOSIS — Z7901 Long term (current) use of anticoagulants: Secondary | ICD-10-CM | POA: Diagnosis not present

## 2022-07-04 DIAGNOSIS — Z9841 Cataract extraction status, right eye: Secondary | ICD-10-CM

## 2022-07-04 DIAGNOSIS — I129 Hypertensive chronic kidney disease with stage 1 through stage 4 chronic kidney disease, or unspecified chronic kidney disease: Secondary | ICD-10-CM | POA: Diagnosis present

## 2022-07-04 DIAGNOSIS — K760 Fatty (change of) liver, not elsewhere classified: Secondary | ICD-10-CM | POA: Diagnosis not present

## 2022-07-04 DIAGNOSIS — Z823 Family history of stroke: Secondary | ICD-10-CM | POA: Diagnosis not present

## 2022-07-04 DIAGNOSIS — D6859 Other primary thrombophilia: Secondary | ICD-10-CM | POA: Diagnosis present

## 2022-07-04 DIAGNOSIS — M4312 Spondylolisthesis, cervical region: Secondary | ICD-10-CM | POA: Diagnosis not present

## 2022-07-04 DIAGNOSIS — N179 Acute kidney failure, unspecified: Secondary | ICD-10-CM | POA: Diagnosis present

## 2022-07-04 DIAGNOSIS — E7800 Pure hypercholesterolemia, unspecified: Secondary | ICD-10-CM | POA: Diagnosis not present

## 2022-07-04 DIAGNOSIS — I492 Junctional premature depolarization: Secondary | ICD-10-CM | POA: Diagnosis not present

## 2022-07-04 DIAGNOSIS — I4510 Unspecified right bundle-branch block: Secondary | ICD-10-CM | POA: Diagnosis present

## 2022-07-04 DIAGNOSIS — M47812 Spondylosis without myelopathy or radiculopathy, cervical region: Secondary | ICD-10-CM | POA: Diagnosis not present

## 2022-07-04 DIAGNOSIS — N182 Chronic kidney disease, stage 2 (mild): Secondary | ICD-10-CM | POA: Diagnosis present

## 2022-07-04 DIAGNOSIS — R531 Weakness: Secondary | ICD-10-CM | POA: Diagnosis not present

## 2022-07-04 HISTORY — PX: TEMPORARY PACEMAKER: CATH118268

## 2022-07-04 LAB — CBC WITH DIFFERENTIAL/PLATELET
Abs Immature Granulocytes: 0.06 10*3/uL (ref 0.00–0.07)
Basophils Absolute: 0.1 10*3/uL (ref 0.0–0.1)
Basophils Relative: 1 %
Eosinophils Absolute: 0.2 10*3/uL (ref 0.0–0.5)
Eosinophils Relative: 2 %
HCT: 42.2 % (ref 36.0–46.0)
Hemoglobin: 13.6 g/dL (ref 12.0–15.0)
Immature Granulocytes: 1 %
Lymphocytes Relative: 16 %
Lymphs Abs: 1.6 10*3/uL (ref 0.7–4.0)
MCH: 33.5 pg (ref 26.0–34.0)
MCHC: 32.2 g/dL (ref 30.0–36.0)
MCV: 103.9 fL — ABNORMAL HIGH (ref 80.0–100.0)
Monocytes Absolute: 0.8 10*3/uL (ref 0.1–1.0)
Monocytes Relative: 8 %
Neutro Abs: 7.3 10*3/uL (ref 1.7–7.7)
Neutrophils Relative %: 72 %
Platelets: 151 10*3/uL (ref 150–400)
RBC: 4.06 MIL/uL (ref 3.87–5.11)
RDW: 14.2 % (ref 11.5–15.5)
WBC: 10.1 10*3/uL (ref 4.0–10.5)
nRBC: 0 % (ref 0.0–0.2)

## 2022-07-04 LAB — TROPONIN I (HIGH SENSITIVITY): Troponin I (High Sensitivity): 7 ng/L (ref <18)

## 2022-07-04 LAB — BASIC METABOLIC PANEL
Anion gap: 10 (ref 5–15)
BUN: 19 mg/dL (ref 8–23)
CO2: 21 mmol/L — ABNORMAL LOW (ref 22–32)
Calcium: 8.6 mg/dL — ABNORMAL LOW (ref 8.9–10.3)
Chloride: 103 mmol/L (ref 98–111)
Creatinine, Ser: 1.21 mg/dL — ABNORMAL HIGH (ref 0.44–1.00)
GFR, Estimated: 44 mL/min — ABNORMAL LOW (ref >60)
Glucose, Bld: 338 mg/dL — ABNORMAL HIGH (ref 70–99)
Potassium: 5.4 mmol/L — ABNORMAL HIGH (ref 3.5–5.1)
Sodium: 134 mmol/L — ABNORMAL LOW (ref 135–145)

## 2022-07-04 LAB — I-STAT CHEM 8, ED
BUN: 25 mg/dL — ABNORMAL HIGH (ref 8–23)
Calcium, Ion: 1.09 mmol/L — ABNORMAL LOW (ref 1.15–1.40)
Chloride: 101 mmol/L (ref 98–111)
Creatinine, Ser: 1.2 mg/dL — ABNORMAL HIGH (ref 0.44–1.00)
Glucose, Bld: 334 mg/dL — ABNORMAL HIGH (ref 70–99)
HCT: 42 % (ref 36.0–46.0)
Hemoglobin: 14.3 g/dL (ref 12.0–15.0)
Potassium: 5.4 mmol/L — ABNORMAL HIGH (ref 3.5–5.1)
Sodium: 136 mmol/L (ref 135–145)
TCO2: 24 mmol/L (ref 22–32)

## 2022-07-04 LAB — GLUCOSE, CAPILLARY: Glucose-Capillary: 174 mg/dL — ABNORMAL HIGH (ref 70–99)

## 2022-07-04 LAB — BRAIN NATRIURETIC PEPTIDE: B Natriuretic Peptide: 201.2 pg/mL — ABNORMAL HIGH (ref 0.0–100.0)

## 2022-07-04 LAB — MAGNESIUM: Magnesium: 1.9 mg/dL (ref 1.7–2.4)

## 2022-07-04 SURGERY — TEMPORARY PACEMAKER
Anesthesia: LOCAL

## 2022-07-04 MED ORDER — ATROPINE SULFATE 1 MG/10ML IJ SOSY
PREFILLED_SYRINGE | INTRAMUSCULAR | Status: AC
  Administered 2022-07-04: 0.5 mg
  Filled 2022-07-04: qty 10

## 2022-07-04 MED ORDER — MIDAZOLAM HCL 2 MG/2ML IJ SOLN
INTRAMUSCULAR | Status: AC
  Filled 2022-07-04: qty 2

## 2022-07-04 MED ORDER — CHLORHEXIDINE GLUCONATE CLOTH 2 % EX PADS
6.0000 | MEDICATED_PAD | Freq: Every day | CUTANEOUS | Status: DC
  Administered 2022-07-05 – 2022-07-06 (×2): 6 via TOPICAL

## 2022-07-04 MED ORDER — HEPARIN (PORCINE) IN NACL 1000-0.9 UT/500ML-% IV SOLN
INTRAVENOUS | Status: AC
  Filled 2022-07-04: qty 500

## 2022-07-04 MED ORDER — BUPROPION HCL ER (XL) 150 MG PO TB24
150.0000 mg | ORAL_TABLET | Freq: Every morning | ORAL | Status: DC
  Administered 2022-07-05 – 2022-07-06 (×2): 150 mg via ORAL
  Filled 2022-07-04 (×2): qty 1

## 2022-07-04 MED ORDER — FENTANYL CITRATE (PF) 100 MCG/2ML IJ SOLN
INTRAMUSCULAR | Status: AC
  Filled 2022-07-04: qty 2

## 2022-07-04 MED ORDER — LIDOCAINE HCL (PF) 1 % IJ SOLN
INTRAMUSCULAR | Status: AC
  Filled 2022-07-04: qty 30

## 2022-07-04 MED ORDER — MIDAZOLAM HCL 2 MG/2ML IJ SOLN
INTRAMUSCULAR | Status: DC | PRN
  Administered 2022-07-04: 1 mg via INTRAVENOUS

## 2022-07-04 MED ORDER — ATROPINE SULFATE 1 MG/ML IV SOLN
0.5000 mg | Freq: Once | INTRAVENOUS | Status: DC
  Filled 2022-07-04: qty 0.5

## 2022-07-04 MED ORDER — ONDANSETRON HCL 4 MG/2ML IJ SOLN
INTRAMUSCULAR | Status: AC
  Administered 2022-07-04: 4 mg via INTRAVENOUS
  Filled 2022-07-04: qty 2

## 2022-07-04 MED ORDER — FENTANYL CITRATE (PF) 100 MCG/2ML IJ SOLN
INTRAMUSCULAR | Status: DC | PRN
  Administered 2022-07-04: 25 ug via INTRAVENOUS

## 2022-07-04 MED ORDER — ACETAMINOPHEN 325 MG PO TABS: 650.0000 mg | ORAL_TABLET | ORAL | Status: DC | PRN

## 2022-07-04 MED ORDER — LIDOCAINE HCL (PF) 1 % IJ SOLN
INTRAMUSCULAR | Status: DC | PRN
  Administered 2022-07-04: 5 mL

## 2022-07-04 MED ORDER — PANTOPRAZOLE SODIUM 40 MG PO TBEC
40.0000 mg | DELAYED_RELEASE_TABLET | Freq: Every day | ORAL | Status: DC
  Administered 2022-07-04 – 2022-07-06 (×3): 40 mg via ORAL
  Filled 2022-07-04 (×3): qty 1

## 2022-07-04 MED ORDER — ATORVASTATIN CALCIUM 40 MG PO TABS
40.0000 mg | ORAL_TABLET | Freq: Every evening | ORAL | Status: DC
  Administered 2022-07-05: 40 mg via ORAL
  Filled 2022-07-04: qty 1

## 2022-07-04 MED ORDER — ONDANSETRON HCL 4 MG/2ML IJ SOLN: 4.0000 mg | Freq: Four times a day (QID) | INTRAMUSCULAR | Status: DC | PRN

## 2022-07-04 MED ORDER — ORAL CARE MOUTH RINSE: 15.0000 mL | OROMUCOSAL | Status: DC | PRN

## 2022-07-04 MED ORDER — ONDANSETRON HCL 4 MG/2ML IJ SOLN: 4.0000 mg | Freq: Once | INTRAMUSCULAR | Status: AC

## 2022-07-04 MED ORDER — HEPARIN (PORCINE) IN NACL 1000-0.9 UT/500ML-% IV SOLN
INTRAVENOUS | Status: DC | PRN
  Administered 2022-07-04: 500 mL

## 2022-07-04 SURGICAL SUPPLY — 7 items
CABLE ADAPT PACING TEMP 12FT (ADAPTER) IMPLANT
ELECT DEFIB PAD ADLT CADENCE (PAD) IMPLANT
KIT MICROPUNCTURE NIT STIFF (SHEATH) IMPLANT
SHEATH PINNACLE 6F 10CM (SHEATH) IMPLANT
SHEATH PROBE COVER 6X72 (BAG) IMPLANT
SLEEVE REPOSITIONING LENGTH 30 (MISCELLANEOUS) IMPLANT
WIRE PACING TEMP ST TIP 5 (CATHETERS) IMPLANT

## 2022-07-04 NOTE — ED Triage Notes (Incomplete)
Pt. Stated,

## 2022-07-04 NOTE — ED Provider Notes (Signed)
[facility] EMERGENCY DEPARTMENT Provider Note   CSN: [id number] Arrival date & time: 07/04/22  8657     History  Chief Complaint  Patient presents with   Atrial Fibrillation    Debra Collins is a 86 y.o. female.  Debra Collins is a 86 y.o. female with history of A-fib, hypertension, hyperlipidemia, diabetes, fatty liver disease, who presents to the emergency department for evaluation of weakness.  Patient reports when she woke up this morning she was feeling very weak and reports feeling lightheaded and dizzy but does not think that she lost consciousness.  Patient's son is at bedside and reports that he got a call from her that she was not feeling well this morning and when he got there she was laying on the floor in her closet.  Patient does not recall passing out and thinks she may have slid to the floor when trying to put her pants on.  Patient has felt intermittently weak and nauseated throughout the morning.  No associated chest pain or shortness of breath.  Noted to be bradycardic on arrival.  Patient denies any known history of bradycardia but reports that she was diagnosed with A-fib and is followed by Dr. Ginnie Smart with cardiology.  She was recently started on flecainide about 1 week ago, she reports taking this this morning as well as her Eliquis and bupropion.  She did not take her atenolol or diltiazem.  Patient underwent unsuccessful cardioversion at the end of November and was scheduled for repeat cardioversion after flecainide on 1/5.  The history is provided by the patient and the spouse.  Atrial Fibrillation Pertinent negatives include no chest pain and no shortness of breath.       Home Medications Prior to Admission medications   Medication Sig Start Date End Date Taking? Authorizing Provider  flecainide (TAMBOCOR) 50 MG tablet Take 1 tablet (50 mg total) by mouth 2 (two) times daily. 06/26/22   Croitoru, Mihai, MD  apixaban (ELIQUIS) 5 MG TABS  tablet Take 1 tablet (5 mg total) by mouth 2 (two) times daily. 05/23/22   Janina Mayo, MD  atenolol (TENORMIN) 100 MG tablet Take  1 tablet  Daily  for BP                                                                    /                                TAKE                          BY                    MOUTH Patient taking differently: Take 50 mg by mouth every evening. 02/03/22   Unk Pinto, MD  atorvastatin (LIPITOR) 40 MG tablet Take 1 tablet daily as directed for cholesterol. Patient taking differently: Take 40 mg by mouth every evening. 10/12/21   Liane Comber, NP  Brimonidine Tartrate (LUMIFY) 0.025 % SOLN Place 1 drop into both eyes 2 (two) times daily as needed (eye redness/irritation.).    [provider]  buPROPion (WELLBUTRIN XL) 150 MG 24 hr tablet Take 150 mg by mouth in the morning.    [provider]  Cholecalciferol (VITAMIN D-3 PO) Take 1 tablet by mouth in the morning.    [provider]  diltiazem (CARTIA XT) 240 MG 24 hr capsule Take  1 capsule  Daily  for BP & Heart Rhythm  ( STOP VERAPAMIL ) Patient taking differently: Take 240 mg by mouth every evening. 05/06/22   Unk Pinto, MD  meloxicam (MOBIC) 15 MG tablet Take 1/2 to 1 tablet daily with food for Pain / Inflammation & Limit to 5 days/week to prevent Kidney Damage Patient taking differently: Take 15 mg by mouth every Monday, Wednesday, and Friday. 01/23/22   Liane Comber, NP  Multiple Vitamin (MULTIVITAMIN WITH MINERALS) TABS tablet Take 1 tablet by mouth in the morning.    [provider]  olmesartan (BENICAR) 40 MG tablet Take  1 tablet  Daily for BP                                                                     /                                   TAKE                                  BY                               MOUTH 06/24/22   Unk Pinto, MD  omeprazole (PRILOSEC) 20 MG capsule Take  1 capsule  Daily  to Prevent Heartburn & Indigestion                                     /                                TAKE                          BY                       MOUTH 01/27/22   Unk Pinto, MD  vitamin B-12 (CYANOCOBALAMIN) 1000 MCG tablet Take 1,000 mcg by mouth daily.    [provider]      Allergies    Patient has no known allergies.    Review of Systems   Review of Systems  Constitutional:  Negative for chills and fever.  HENT: Negative.    Respiratory:  Negative for cough and shortness of breath.   Cardiovascular:  Negative for chest pain and palpitations.  Gastrointestinal:  Positive for nausea. Negative for vomiting.  Neurological:  Positive for weakness and light-headedness.    Physical Exam Updated Vital Signs BP 101/89   Pulse (!) 57   Resp 14   SpO2 100%  Physical Exam Vitals and nursing note reviewed.  Constitutional:      General: She is not in acute distress.    Appearance: Normal appearance. She is well-developed. She is not diaphoretic.     Comments: Elderly female alert, chronically ill-appearing and in no acute distress.  HENT:     Head: Normocephalic and atraumatic.     Mouth/Throat:     Mouth: Mucous membranes are moist.     Pharynx: Oropharynx is clear.  Eyes:     General:        Right eye: No discharge.        Left eye: No discharge.     Pupils: Pupils are equal, round, and reactive to light.  Cardiovascular:     Rate and Rhythm: Regular rhythm. Bradycardia present.     Pulses: Normal pulses.     Heart sounds: Normal heart sounds.     Comments: Bradycardia with regular rhythm with heart rate in the 30s Pulmonary:     Effort: Pulmonary effort is normal. No respiratory distress.     Breath sounds: Normal breath sounds. No wheezing or rales.     Comments: Respirations equal and unlabored, patient able to speak in full sentences, lungs clear to auscultation bilaterally  Abdominal:     General: Bowel sounds are normal. There is no distension.     Palpations: Abdomen is soft. There is no  mass.     Tenderness: There is no abdominal tenderness. There is no guarding.     Comments: Abdomen soft, nondistended, nontender to palpation in all quadrants without guarding or peritoneal signs  Musculoskeletal:        General: No deformity.     Cervical back: Neck supple.     Right lower leg: Edema present.     Left lower leg: Edema present.     Comments: 1+ bilateral lower extremity edema.  Skin:    General: Skin is warm and dry.     Capillary Refill: Capillary refill takes less than 2 seconds.  Neurological:     Mental Status: She is alert and oriented to person, place, and time.     Coordination: Coordination normal.     Comments: Speech is clear, able to follow commands CN III-XII intact Normal strength in upper and lower extremities bilaterally including dorsiflexion and plantar flexion, strong and equal grip strength Sensation normal to light and sharp touch Moves extremities without ataxia, coordination intact  Psychiatric:        Mood and Affect: Mood normal.        Behavior: Behavior normal.     ED Results / Procedures / Treatments   Labs (all labs ordered are listed, but only abnormal results are displayed) Labs Reviewed  CBC WITH DIFFERENTIAL/PLATELET - Abnormal; Notable for the following components:      Result Value   MCV 103.9 (*)    All other components within normal limits  BASIC METABOLIC PANEL - Abnormal; Notable for the following components:   Sodium 134 (*)    Potassium 5.4 (*)    CO2 21 (*)    Glucose, Bld 338 (*)    Creatinine, Ser 1.21 (*)    Calcium 8.6 (*)    GFR, Estimated 44 (*)    All other components within normal limits  I-STAT CHEM 8, ED - Abnormal; Notable for the following components:   Potassium 5.4 (*)    BUN 25 (*)    Creatinine, Ser 1.20 (*)    Glucose, Bld 334 (*)  Calcium, Ion 1.09 (*)    All other components within normal limits  MAGNESIUM  BRAIN NATRIURETIC PEPTIDE  TROPONIN I (HIGH SENSITIVITY)     EKG None  Radiology   DG Chest Port 1 View  Result Date: 07/04/2022 CLINICAL DATA:  Weakness EXAM: PORTABLE CHEST 1 VIEW COMPARISON:  Portable exam 0950 hours compared to 05/12/2017 FINDINGS: External pacing leads project over chest. Enlargement of cardiac silhouette. Atherosclerotic calcification aorta. Pulmonary vascularity normal. Minimal bibasilar atelectasis. Remaining lungs clear. No pleural effusion, pneumothorax, or acute osseous findings. IMPRESSION: Minimal bibasilar atelectasis. Enlargement of cardiac silhouette. Aortic Atherosclerosis (ICD10-I70.0). Electronically Signed   By: Lavonia Dana M.D.   On: 07/04/2022 10:07   CT Head Wo Contrast  Result Date: 07/04/2022 CLINICAL DATA:  Provided history: Head trauma, minor. Facial trauma, blunt. Neck trauma. EXAM: CT HEAD WITHOUT CONTRAST CT MAXILLOFACIAL WITHOUT CONTRAST CT CERVICAL SPINE WITHOUT CONTRAST TECHNIQUE: Multidetector CT imaging of the head, cervical spine, and maxillofacial structures were performed using the standard protocol without intravenous contrast. Multiplanar CT image reconstructions of the cervical spine and maxillofacial structures were also generated. RADIATION DOSE REDUCTION: This exam was performed according to the departmental dose-optimization program which includes automated exposure control, adjustment of the mA and/or kV according to patient size and/or use of iterative reconstruction technique. COMPARISON:  Brain MRI 10/19/2016. Cervical spine CT 11/11/2016. FINDINGS: CT HEAD FINDINGS Brain: Mild generalized cerebral atrophy. Mild patchy and ill-defined hypoattenuation within the cerebral white matter, nonspecific but compatible with chronic small vessel ischemic disease. There is no acute intracranial hemorrhage. No demarcated cortical infarct. No extra-axial fluid collection. No evidence of an intracranial mass. No midline shift. Vascular: No hyperdense vessel. Atherosclerotic calcifications. Skull: No  fracture or aggressive osseous lesion. CT MAXILLOFACIAL FINDINGS Osseous: No evidence of acute maxillofacial fracture. Orbits: No acute orbital finding. Sinuses: Mild mucosal thickening within the bilateral maxillary sinuses. Soft tissues: No maxillofacial hematoma is appreciable by CT. CT CERVICAL SPINE FINDINGS Alignment: Straightening of the expected cervical lordosis. 1-2 mm grade 1 anterolisthesis at C2-C3 and C3-C4. Skull base and vertebrae: The basion-dental and atlanto-dental intervals are maintained.No evidence of acute fracture to the cervical spine. 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 Soft tissues and spinal canal: No prevertebral fluid or swelling. No visible canal hematoma. Disc levels: Prior C5-C7 ACDF. Solid arthrodesis at C5-C6 and C6-C7. No evidence of acute hardware compromise. Cervical spondylosis with multilevel disc space narrowing, disc bulges/central disc protrusions, uncovertebral hypertrophy and facet arthrosis. Disc space narrowing is greatest at T1-T2 (advanced at this level). No appreciable high-grade spinal canal stenosis. Multilevel bony neural foraminal narrowing. Degenerative changes are also present at the C1-C2 articulation. Upper chest: No consolidation within the imaged lung apices. No visible pneumothorax. Other: Atherosclerotic plaque within the visualized aortic arch, proximal major branch vessels of the neck and carotid arteries. IMPRESSION: CT head: 1. No evidence of acute intracranial abnormality. 2. Mild chronic small vessel ischemic changes within the cerebral white matter. 3. Mild generalized cerebral atrophy. CT maxillofacial: No evidence of acute maxillofacial fracture. CT cervical spine: 1. No evidence of acute fracture to the cervical spin 2. 1-2 mm grade 1 anterolisthesis at C2-C3 and C3-C4. 3. Prior C5-C7 ACDF. No evidence of acute hardware compromise. 4. Cervical spondylosis, as described. 5. Facet joint ankylosis on the right at  C3-C4, and bilaterally at C4-C5. Electronically Signed   By: Kellie Simmering D.O.   On: 07/04/2022 09:46   CT Cervical Spine Wo Contrast  Result Date:  07/04/2022 CLINICAL DATA:  Provided history: Head trauma, minor. Facial trauma, blunt. Neck trauma. EXAM: CT HEAD WITHOUT CONTRAST CT MAXILLOFACIAL WITHOUT CONTRAST CT CERVICAL SPINE WITHOUT CONTRAST TECHNIQUE: Multidetector CT imaging of the head, cervical spine, and maxillofacial structures were performed using the standard protocol without intravenous contrast. Multiplanar CT image reconstructions of the cervical spine and maxillofacial structures were also generated. RADIATION DOSE REDUCTION: This exam was performed according to the departmental dose-optimization program which includes automated exposure control, adjustment of the mA and/or kV according to patient size and/or use of iterative reconstruction technique. COMPARISON:  Brain MRI 10/19/2016. Cervical spine CT 11/11/2016. FINDINGS: CT HEAD FINDINGS Brain: Mild generalized cerebral atrophy. Mild patchy and ill-defined hypoattenuation within the cerebral white matter, nonspecific but compatible with chronic small vessel ischemic disease. There is no acute intracranial hemorrhage. No demarcated cortical infarct. No extra-axial fluid collection. No evidence of an intracranial mass. No midline shift. Vascular: No hyperdense vessel. Atherosclerotic calcifications. Skull: No fracture or aggressive osseous lesion. CT MAXILLOFACIAL FINDINGS Osseous: No evidence of acute maxillofacial fracture. Orbits: No acute orbital finding. Sinuses: Mild mucosal thickening within the bilateral maxillary sinuses. Soft tissues: No maxillofacial hematoma is appreciable by CT. CT CERVICAL SPINE FINDINGS Alignment: Straightening of the expected cervical lordosis. 1-2 mm grade 1 anterolisthesis at C2-C3 and C3-C4. Skull base and vertebrae: The basion-dental and atlanto-dental intervals are maintained.No evidence of acute fracture  to the cervical spine. 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 Soft tissues and spinal canal: No prevertebral fluid or swelling. No visible canal hematoma. Disc levels: Prior C5-C7 ACDF. Solid arthrodesis at C5-C6 and C6-C7. No evidence of acute hardware compromise. Cervical spondylosis with multilevel disc space narrowing, disc bulges/central disc protrusions, uncovertebral hypertrophy and facet arthrosis. Disc space narrowing is greatest at T1-T2 (advanced at this level). No appreciable high-grade spinal canal stenosis. Multilevel bony neural foraminal narrowing. Degenerative changes are also present at the C1-C2 articulation. Upper chest: No consolidation within the imaged lung apices. No visible pneumothorax. Other: Atherosclerotic plaque within the visualized aortic arch, proximal major branch vessels of the neck and carotid arteries. IMPRESSION: CT head: 1. No evidence of acute intracranial abnormality. 2. Mild chronic small vessel ischemic changes within the cerebral white matter. 3. Mild generalized cerebral atrophy. CT maxillofacial: No evidence of acute maxillofacial fracture. CT cervical spine: 1. No evidence of acute fracture to the cervical spin 2. 1-2 mm grade 1 anterolisthesis at C2-C3 and C3-C4. 3. Prior C5-C7 ACDF. No evidence of acute hardware compromise. 4. Cervical spondylosis, as described. 5. 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 Electronically Signed   By: Kellie Simmering D.O.   On: 07/04/2022 09:46   CT Maxillofacial Wo Contrast  Result Date: 07/04/2022 CLINICAL DATA:  Provided history: Head trauma, minor. Facial trauma, blunt. Neck trauma. EXAM: CT HEAD WITHOUT CONTRAST CT MAXILLOFACIAL WITHOUT CONTRAST CT CERVICAL SPINE WITHOUT CONTRAST TECHNIQUE: Multidetector CT imaging of the head, cervical spine, and maxillofacial structures were performed using the standard protocol without intravenous contrast. Multiplanar CT image  reconstructions of the cervical spine and maxillofacial structures were also generated. RADIATION DOSE REDUCTION: This exam was performed according to the departmental dose-optimization program which includes automated exposure control, adjustment of the mA and/or kV according to patient size and/or use of iterative reconstruction technique. COMPARISON:  Brain MRI 10/19/2016. Cervical spine CT 11/11/2016. FINDINGS: CT HEAD FINDINGS Brain: Mild generalized cerebral atrophy. Mild patchy and ill-defined hypoattenuation within the cerebral white matter, nonspecific but  compatible with chronic small vessel ischemic disease. There is no acute intracranial hemorrhage. No demarcated cortical infarct. No extra-axial fluid collection. No evidence of an intracranial mass. No midline shift. Vascular: No hyperdense vessel. Atherosclerotic calcifications. Skull: No fracture or aggressive osseous lesion. CT MAXILLOFACIAL FINDINGS Osseous: No evidence of acute maxillofacial fracture. Orbits: No acute orbital finding. Sinuses: Mild mucosal thickening within the bilateral maxillary sinuses. Soft tissues: No maxillofacial hematoma is appreciable by CT. CT CERVICAL SPINE FINDINGS Alignment: Straightening of the expected cervical lordosis. 1-2 mm grade 1 anterolisthesis at C2-C3 and C3-C4. Skull base and vertebrae: The basion-dental and atlanto-dental intervals are maintained.No evidence of acute fracture to the cervical spine. 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 Soft tissues and spinal canal: No prevertebral fluid or swelling. No visible canal hematoma. Disc levels: Prior C5-C7 ACDF. Solid arthrodesis at C5-C6 and C6-C7. No evidence of acute hardware compromise. Cervical spondylosis with multilevel disc space narrowing, disc bulges/central disc protrusions, uncovertebral hypertrophy and facet arthrosis. Disc space narrowing is greatest at T1-T2 (advanced at this level). No appreciable high-grade  spinal canal stenosis. Multilevel bony neural foraminal narrowing. Degenerative changes are also present at the C1-C2 articulation. Upper chest: No consolidation within the imaged lung apices. No visible pneumothorax. Other: Atherosclerotic plaque within the visualized aortic arch, proximal major branch vessels of the neck and carotid arteries. IMPRESSION: CT head: 1. No evidence of acute intracranial abnormality. 2. Mild chronic small vessel ischemic changes within the cerebral white matter. 3. Mild generalized cerebral atrophy. CT maxillofacial: No evidence of acute maxillofacial fracture. CT cervical spine: 1. No evidence of acute fracture to the cervical spin 2. 1-2 mm grade 1 anterolisthesis at C2-C3 and C3-C4. 3. Prior C5-C7 ACDF. No evidence of acute hardware compromise. 4. Cervical spondylosis, as described. 5. 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 Electronically Signed   By: Kellie Simmering D.O.   On: 07/04/2022 09:46    Procedures .Critical Care  Performed by: Jacqlyn Larsen, PA-C Authorized by: Jacqlyn Larsen, PA-C   Critical care provider statement:    Critical care time (minutes):  45   Critical care was time spent personally by me on the following activities:  Development of treatment plan with patient or surrogate, discussions with consultants, evaluation of patient's response to treatment, examination of patient, ordering and review of laboratory studies, ordering and review of radiographic studies, ordering and performing treatments and interventions, pulse oximetry, re-evaluation of patient's condition and review of old charts   Care discussed with: admitting provider       Medications Ordered in ED Medications  atropine 1 MG/10ML injection (has no administration in time range)  ondansetron (ZOFRAN) injection 4 mg (has no administration in time range)  ondansetron (ZOFRAN) 4 MG/2ML injection (has no administration in time range)  atropine injection  0.5 mg (has no administration in time range)    ED Course/ Medical Decision Making/ A&P                           Medical Decision Making Amount and/or Complexity of Data Reviewed Labs: ordered. Radiology: ordered.  Risk Decision regarding hospitalization.   86 y.o. female presents to the ED with complaints of weakness, near syncope, this involves an extensive number of treatment options, and is a complaint that carries with it a high risk of complications and morbidity.  The differential diagnosis includes arrhythmia, electrolyte derangement, dehydration, MI,  CHF, PE  On arrival pt alert and able to answer questions but found to be bradycardic with initial heart rate of 25, on my evaluation in room heart rate ranging in the 30s-40s.   Additional history obtained from husband at bedside. Previous records obtained and reviewed including recent cardiology follow-up  Patient placed on ZOLL pads and heart rate closely monitored, will hold off on atropine at this time as patient is asymptomatic alert and able to answer questions.  Lab Tests:  I Ordered, reviewed, and interpreted labs, which included: No leukocytosis and normal hemoglobin, very mildly elevated potassium of 5.4, glucose of 338 and creatinine of 1.21, no other electrolyte derangements, troponin normal at 7, BNP mildly elevated at 201.    Imaging Studies ordered:  I ordered imaging studies which included chest x-ray, CT of the head, cervical spine and maxillofacial bones given concern for possible fall or syncopal episode on blood thinners., I independently visualized and interpreted imaging which showed bibasilar atelectasis, cardiomegaly, no intracranial bleeding or skull fracture, no facial fracture or cervical spine fracture.  ED Course:   Consult placed to cardiology for bradycardia, concerned that patient will likely need pacemaker.  Dr. Regenia Skeeter was called to bedside as patient became acutely dizzy and nauseated and  bradycardia down to the 20s, looked quite ill, given 0.5 mg of atropine with improvement in heart rate, went into ventricular bigeminy.  Cardiology called back and asked to come to the bedside sooner given worsening of patient's symptoms.  After atropine and Zofran patient improving symptomatically  Dr. Marlou Porch and Dr. Quentin Ore with cardiology at bedside, reviewed EKG and rhythm tracing, plan to take patient directly to Cath Lab for temporary pacing wire and patient will be admitted to cardiology service.   Portions of this note were generated with  [employer]. Dictation errors may occur despite best attempts at proofreading.         Final Clinical Impression(s) / ED Diagnoses Final diagnoses:  Junctional bradycardia    Rx / DC Orders ED Discharge Orders     None         Janet Berlin 07/04/22 1637    Cristie Hem, MD 07/05/22 [phone]

## 2022-07-04 NOTE — Interval H&P Note (Signed)
History and Physical Interval Note:  07/04/2022 10:51 AM  Debra Collins  has presented today for surgery, with the diagnosis of marked symptomatic bradycardia.  The various methods of treatment have been discussed with the patient and family. After consideration of risks, benefits and other options for treatment, the patient has consented to  Procedure(s): TEMPORARY PACEMAKER (N/A) as a surgical intervention.  The patient's history has been reviewed, patient examined, no change in status, stable for surgery.  I have reviewed the patient's chart and labs.  Questions were answered to the patient's satisfaction.     Collier Salina [facility] 07/04/2022 10:52 AM

## 2022-07-04 NOTE — ED Notes (Signed)
Pt transported to cath lab by cath, RN's

## 2022-07-04 NOTE — ED Notes (Addendum)
Pt was placed on zoll. Pt placed on 2L 02 per [place]

## 2022-07-04 NOTE — Progress Notes (Incomplete)
Primary Care Physician: Unk Pinto, MD Primary Cardiologist: Dr Sallyanne Kuster  Primary Electrophysiologist: none Referring Physician: Dr Christie Beckers is a 86 y.o. female with a history of HLD, HTN, DM, atrial fibrillation who presents for consultation in the [facility].  The patient was initially diagnosed with atrial fibrillation 04/2022 and underwent DCCV on 06/13/22. Unfortunately, she had quick return of her arrhythmia. Seen by Dr Sallyanne Kuster 06/20/22 and she was started on flecainide after completing a stress test which showed no ischemia. Patient is on Eliquis for a CHADS2VASC score of 5. ***  Today, she denies symptoms of ***palpitations, chest pain, shortness of breath, orthopnea, PND, lower extremity edema, dizziness, presyncope, syncope, snoring, daytime somnolence, bleeding, or neurologic sequela. The patient is tolerating medications without difficulties and is otherwise without complaint today.    Atrial Fibrillation Risk Factors:  she does not have symptoms or diagnosis of sleep apnea. she does not have a history of rheumatic fever.   she has a BMI of There is no height or weight on file to calculate BMI.. There were no vitals filed for this visit.  Family History  Problem Relation Age of Onset   Stroke Mother    Heart disease Mother    Heart disease Father    Heart attack Father    Heart attack Sister      Atrial Fibrillation Management history:  Previous antiarrhythmic drugs: flecainide  Previous cardioversions: 06/13/22 Previous ablations: none CHADS2VASC score: 5 Anticoagulation history: Eliquis   Past Medical History:  Diagnosis Date   DDD (degenerative disc disease), lumbar    Fatty liver disease, nonalcoholic    CT AB 2694   Fibrocystic breast disease    Hyperlipidemia    Hypertension    Type II or unspecified type diabetes mellitus without mention of complication, not stated as uncontrolled    Vitamin D  deficiency    Past Surgical History:  Procedure Laterality Date   APPENDECTOMY     CARDIOVERSION N/A 06/13/2022   Procedure: CARDIOVERSION;  Surgeon: Buford Dresser, MD;  Location: [facility] ENDOSCOPY;  Service: Cardiovascular;  Laterality: N/A;   CATARACT EXTRACTION, BILATERAL Bilateral 09/2017   Dr. Gershon Crane   KNEE ARTHROSCOPY Left    MASTECTOMY Bilateral 1985   Due to severe fibrocystic breast disease    Current Outpatient Medications  Medication Sig Dispense Refill   flecainide (TAMBOCOR) 50 MG tablet Take 1 tablet (50 mg total) by mouth 2 (two) times daily. 180 tablet 3   apixaban (ELIQUIS) 5 MG TABS tablet Take 1 tablet (5 mg total) by mouth 2 (two) times daily. 60 tablet 5   atenolol (TENORMIN) 100 MG tablet Take  1 tablet  Daily  for BP                                                                    /                                TAKE                          BY  MOUTH (Patient taking differently: Take 50 mg by mouth every evening.) 90 tablet 3   atorvastatin (LIPITOR) 40 MG tablet Take 1 tablet daily as directed for cholesterol. (Patient taking differently: Take 40 mg by mouth every evening.) 90 tablet 3   Brimonidine Tartrate (LUMIFY) 0.025 % SOLN Place 1 drop into both eyes 2 (two) times daily as needed (eye redness/irritation.).     buPROPion (WELLBUTRIN XL) 150 MG 24 hr tablet Take 150 mg by mouth in the morning.     Cholecalciferol (VITAMIN D-3 PO) Take 1 tablet by mouth in the morning.     diltiazem (CARTIA XT) 240 MG 24 hr capsule Take  1 capsule  Daily  for BP & Heart Rhythm  ( STOP VERAPAMIL ) (Patient taking differently: Take 240 mg by mouth every evening.) 90 capsule 1   meloxicam (MOBIC) 15 MG tablet Take 1/2 to 1 tablet daily with food for Pain / Inflammation & Limit to 5 days/week to prevent Kidney Damage (Patient taking differently: Take 15 mg by mouth every Monday, Wednesday, and Friday.) 90 tablet 1   Multiple Vitamin (MULTIVITAMIN WITH  MINERALS) TABS tablet Take 1 tablet by mouth in the morning.     olmesartan (BENICAR) 40 MG tablet Take  1 tablet  Daily for BP                                                                     /                                   TAKE                                  BY                               MOUTH 90 tablet 3   omeprazole (PRILOSEC) 20 MG capsule Take  1 capsule  Daily  to Prevent Heartburn & Indigestion                                    /                                TAKE                          BY                       MOUTH 90 capsule 3   vitamin B-12 (CYANOCOBALAMIN) 1000 MCG tablet Take 1,000 mcg by mouth daily.     No current facility-administered medications for this visit.    No Known Allergies  Social History   Socioeconomic History   Marital status: Married    Spouse name: Not on file   Number of children: Not on file   Years of education: Not on file  Highest education level: Not on file  Occupational History   Occupation: retired    Comment: insurance  Tobacco Use   Smoking status: Former    Types: Cigarettes    Quit date: 07/15/1993    Years since quitting: 28.9   Smokeless tobacco: Never  Substance and Sexual Activity   Alcohol use: No   Drug use: No   Sexual activity: Not on file  Other Topics Concern   Not on file  Social History Narrative   Not on file   Social Determinants of Health   Financial Resource Strain: Not on file  Food Insecurity: No Food Insecurity (06/29/2021)   Hunger Vital Sign    Worried About Running Out of Food in the Last Year: Never true    Ran Out of Food in the Last Year: Never true  Transportation Needs: No Transportation Needs (06/29/2021)   PRAPARE - Hydrologist (Medical): No    Lack of Transportation (Non-Medical): No  Physical Activity: Not on file  Stress: Not on file  Social Connections: Not on file  Intimate Partner Violence: Not on file     ROS- All systems are reviewed and  negative except as per the HPI above.  Physical Exam: There were no vitals filed for this visit.  GEN- The patient is a well appearing ***{Desc; female/female:11659}, alert and oriented x 3 today.   Head- normocephalic, atraumatic Eyes-  Sclera clear, conjunctiva pink Ears- hearing intact Oropharynx- clear Neck- supple  Lungs- Clear to ausculation bilaterally, normal work of breathing Heart- ***Regular rate and rhythm, no murmurs, rubs or gallops  GI- soft, NT, ND, + BS Extremities- no clubbing, cyanosis, or edema MS- no significant deformity or atrophy Skin- no rash or lesion Psych- euthymic mood, full affect Neuro- strength and sensation are intact  Wt Readings from Last 3 Encounters:  06/25/22 72.6 kg  06/20/22 72.8 kg  06/13/22 68 kg    EKG today demonstrates  ***  Echo 06/26/22 demonstrated   1. Left ventricular ejection fraction, by estimation, is 60 to 65%. The  left ventricle has normal function. The left ventricle has no regional  wall motion abnormalities. Left ventricular diastolic function could not  be evaluated.   2. Right ventricular systolic function is normal. The right ventricular  size is normal. There is mildly elevated pulmonary artery systolic  pressure. The estimated right ventricular systolic pressure is 56.2 mmHg.   3. The mitral valve is normal in structure. No evidence of mitral valve  regurgitation. No evidence of mitral stenosis.   4. The aortic valve is tricuspid. Aortic valve regurgitation is not  visualized. Aortic valve sclerosis/calcification is present, without any  evidence of aortic stenosis.   5. There is mild dilatation of the ascending aorta, measuring 39 mm.   6. The inferior vena cava is normal in size with greater than 50%  respiratory variability, suggesting right atrial pressure of 3 mmHg.   Epic records are reviewed at length today  CHA2DS2-VASc Score = 5  The patient's score is based upon: CHF History: 0 HTN History:  1 Diabetes History: 1 Stroke History: 0 Vascular Disease History: 0 Age Score: 2 Gender Score: 1   {Confirm score is correct.  If not, click here to update score.  REFRESH note.  :1}    ASSESSMENT AND PLAN: 1. Persistent Atrial Fibrillation (ICD10:  I48.19) The patient's CHA2DS2-VASc score is 5, indicating a 7.2% annual risk of stroke.   *** She is scheduled  for DCCV on 07/19/22. Continue flecainide 50 mg BID Continue atenolol 100 mg daily Continue diltiazem 240 mg daily Continue Eliquis 5 mg BID  2. Secondary Hypercoagulable State (FFK92:  D68.69){Click to add to Prob List or Visit Dx  :[id number]} The patient is at significant risk for stroke/thromboembolism based upon her CHA2DS2-VASc Score of 5.  Continue Apixaban (Eliquis).   3. HTN Stable, no changes today. ***   Follow up ***with Dr Sallyanne Kuster as scheduled. AF clinic in 6 months.    [facility] [address] [place], [place] [address] [phone] 07/04/2022 8:36 AM

## 2022-07-04 NOTE — H&P (Signed)
Cardiology Admission History and Physical   Patient ID: YITTA GONGAWARE MRN: [id number]; DOB: September 12, 1935   Admission date: 07/04/2022  PCP:  Unk Pinto, MD   [facility] Cardiologist:  Debra Collins   Chief Complaint:  near syncope  Patient Profile:   Debra Collins is a 86 y.o. female with HTN, HLD, AFib, DM who is being seen 07/04/2022 for the evaluation of symptomatic bradycardia.  History of Present Illness:   Debra Collins was found to have AFib referred to Debra Collins. Debra Collins 05/23/22, she was on Eliquis, dilt ('240mg'$  daily) and atenolol ('100mg'$  daily) (via her PMD)  and planned for DCCV.  DCCV 06/13/22 > SR  She saw Debra Collins, 06/20/22 she was back in AFib, planned for stress testing/echo and perhaps flecainide  TTE noted LVEF 60-65%, no WMA RVSP 42, valves OK Stress myoview was normal Started flecainide '50mg'$  BID  Planned for another DCCV if needed  Today she was getting ready for an appointment when she felt very weak, slid to the floor without falling or injury. Too weak to get up her sion had to help heron arrival to the ER found markedly bradycardic though mentating well, and initially held off on atropine, though ultimately did start to be come lightheaded with rates towards hiogh 20s and given atropine with improvement in her HR to some sinus beats and junctional bradycardia in the 30's Cardiology called, EP asked to bedside.  LABS K+ 5.4 Mag 1.9 BUN/Creat 25/1.20 (Baseline 0.7) BNP 201 HS Trop 7 WBC 10.1 H/H 14.3/42 Plts 151  The patient denies CP, she has some chronic jaw pain that is musculoskeletal. She did not have syncope. Denies trauma   Past Medical History:  Diagnosis Date   DDD (degenerative disc disease), lumbar    Fatty liver disease, nonalcoholic    CT AB 4098   Fibrocystic breast disease    Hyperlipidemia    Hypertension    Type II or unspecified type diabetes mellitus without mention of complication, not stated  as uncontrolled    Vitamin D deficiency     Past Surgical History:  Procedure Laterality Date   APPENDECTOMY     CARDIOVERSION N/A 06/13/2022   Procedure: CARDIOVERSION;  Surgeon: Buford Dresser, MD;  Location: [facility] ENDOSCOPY;  Service: Cardiovascular;  Laterality: N/A;   CATARACT EXTRACTION, BILATERAL Bilateral 09/2017   Dr. Gershon Crane   KNEE ARTHROSCOPY Left    MASTECTOMY Bilateral 1985   Due to severe fibrocystic breast disease     Medications Prior to Admission: Prior to Admission medications   Medication Sig Start Date End Date Taking? Authorizing Provider  flecainide (TAMBOCOR) 50 MG tablet Take 1 tablet (50 mg total) by mouth 2 (two) times daily. 06/26/22   Croitoru, Mihai, MD  apixaban (ELIQUIS) 5 MG TABS tablet Take 1 tablet (5 mg total) by mouth 2 (two) times daily. 05/23/22   Janina Mayo, MD  atenolol (TENORMIN) 100 MG tablet Take  1 tablet  Daily  for BP                                                                    /  TAKE                          BY                    MOUTH Patient taking differently: Take 50 mg by mouth every evening. 02/03/22   Unk Pinto, MD  atorvastatin (LIPITOR) 40 MG tablet Take 1 tablet daily as directed for cholesterol. Patient taking differently: Take 40 mg by mouth every evening. 10/12/21   Liane Comber, NP  Brimonidine Tartrate (LUMIFY) 0.025 % SOLN Place 1 drop into both eyes 2 (two) times daily as needed (eye redness/irritation.).    [provider]  buPROPion (WELLBUTRIN XL) 150 MG 24 hr tablet Take 150 mg by mouth in the morning.    [provider]  Cholecalciferol (VITAMIN D-3 PO) Take 1 tablet by mouth in the morning.    [provider]  diltiazem (CARTIA XT) 240 MG 24 hr capsule Take  1 capsule  Daily  for BP & Heart Rhythm  ( STOP VERAPAMIL ) Patient taking differently: Take 240 mg by mouth every evening. 05/06/22   Unk Pinto, MD  meloxicam (MOBIC) 15 MG  tablet Take 1/2 to 1 tablet daily with food for Pain / Inflammation & Limit to 5 days/week to prevent Kidney Damage Patient taking differently: Take 15 mg by mouth every Monday, Wednesday, and Friday. 01/23/22   Liane Comber, NP  Multiple Vitamin (MULTIVITAMIN WITH MINERALS) TABS tablet Take 1 tablet by mouth in the morning.    [provider]  olmesartan (BENICAR) 40 MG tablet Take  1 tablet  Daily for BP                                                                     /                                   TAKE                                  BY                               MOUTH 06/24/22   Unk Pinto, MD  omeprazole (PRILOSEC) 20 MG capsule Take  1 capsule  Daily  to Prevent Heartburn & Indigestion                                    /                                TAKE                          BY                       MOUTH 01/27/22  Unk Pinto, MD  vitamin B-12 (CYANOCOBALAMIN) 1000 MCG tablet Take 1,000 mcg by mouth daily.    [provider]     Allergies:   No Known Allergies  Social History:   Social History   Socioeconomic History   Marital status: Married    Spouse name: Not on file   Number of children: Not on file   Years of education: Not on file   Highest education level: Not on file  Occupational History   Occupation: retired    Comment: insurance  Tobacco Use   Smoking status: Former    Types: Cigarettes    Quit date: 07/15/1993    Years since quitting: 28.9   Smokeless tobacco: Never  Substance and Sexual Activity   Alcohol use: No   Drug use: No   Sexual activity: Not on file  Other Topics Concern   Not on file  Social History Narrative   Not on file   Social Determinants of Health   Financial Resource Strain: Not on file  Food Insecurity: No Food Insecurity (06/29/2021)   Hunger Vital Sign    Worried About Running Out of Food in the Last Year: Never true    Ran Out of Food in the Last Year: Never true  Transportation  Needs: No Transportation Needs (06/29/2021)   PRAPARE - Hydrologist (Medical): No    Lack of Transportation (Non-Medical): No  Physical Activity: Not on file  Stress: Not on file  Social Connections: Not on file  Intimate Partner Violence: Not on file    Family History:   The patient's family history includes Heart attack in her father and sister; Heart disease in her father and mother; Stroke in her mother.    ROS:  Please see the history of present illness.  All other ROS reviewed and negative.     Physical Exam/Data:   Vitals:   07/04/22 0845 07/04/22 0915 07/04/22 0945 07/04/22 1002  BP: (!) 125/58 98/65 101/89   Pulse: (!) 58 (!) 29 (!) 57   Resp: '14 12 14   '$ Temp:    (!) 97.5 F (36.4 C)  TempSrc:    Oral  SpO2: 98% 100% 100%    No intake or output data in the 24 hours ending 07/04/22 1035    06/25/2022    7:32 AM 06/20/2022   10:19 AM 06/13/2022    7:08 AM  Last 3 Weights  Weight (lbs) 160 lb 160 lb 9.6 oz 150 lb  Weight (kg) 72.576 kg 72.848 kg 68.04 kg     There is no height or weight on file to calculate BMI.  General:  Well nourished, well developed, in no acute distress HEENT: normal Neck: no JVD Vascular: No carotid bruits; Distal pulses 2+ bilaterally   Cardiac:  regularly irregular, bradycardic; no murmurs, gallops or rubs Lungs:  CTA b/l, no wheezing, rhonchi or rales  Abd: soft, nontender, no hepatomegaly  Ext: no edema Musculoskeletal:  No deformities Skin: warm and dry  Neuro:  no focal abnormalities noted Psych:  Normal affect    EKG:  The ECG that was done today was personally reviewed and demonstrates  Junctional rhythm/bradycardia with her baseline RBBB 30bpm  Old 05/23/22, AFib 62bpm, RBBB 06/13/22 SB 58bpm, RBBB 06/20/22, AFib 64bpm, RBBB  Relevant CV Studies:  06/26/22: TTE  1. Left ventricular ejection fraction, by estimation, is 60 to 65%. The  left ventricle has normal function. The left ventricle  has no regional  wall motion abnormalities. Left ventricular diastolic function could not  be evaluated.   2. Right ventricular systolic function is normal. The right ventricular  size is normal. There is mildly elevated pulmonary artery systolic  pressure. The estimated right ventricular systolic pressure is 24.4 mmHg.   3. The mitral valve is normal in structure. No evidence of mitral valve  regurgitation. No evidence of mitral stenosis.   4. The aortic valve is tricuspid. Aortic valve regurgitation is not  visualized. Aortic valve sclerosis/calcification is present, without any  evidence of aortic stenosis.   5. There is mild dilatation of the ascending aorta, measuring 39 mm.   6. The inferior vena cava is normal in size with greater than 50%  respiratory variability, suggesting right atrial pressure of 3 mmHg.    06/25/22: stress myoview   The study is normal. The study is low risk.   No ST deviation was noted.  Right bundle branch block noted as baseline with atrial fibrillation.   LV perfusion is normal. There is no evidence of ischemia. There is no evidence of infarction.   Left ventricular function is normal. Nuclear stress EF: 67 %. The left ventricular ejection fraction is hyperdynamic (>65%). End diastolic cavity size is normal. End systolic cavity size is normal.  Laboratory Data:  High Sensitivity Troponin:   Recent Labs  Lab 07/04/22 0846  TROPONINIHS 7      Chemistry Recent Labs  Lab 07/04/22 0846 07/04/22 0858  NA 134* 136  K 5.4* 5.4*  CL 103 101  CO2 21*  --   GLUCOSE 338* 334*  BUN 19 25*  CREATININE 1.21* 1.20*  CALCIUM 8.6*  --   MG 1.9  --   GFRNONAA 44*  --   ANIONGAP 10  --     No results for input(s): "PROT", "ALBUMIN", "AST", "ALT", "ALKPHOS", "BILITOT" in the last 168 hours. Lipids No results for input(s): "CHOL", "TRIG", "HDL", "LABVLDL", "LDLCALC", "CHOLHDL" in the last 168 hours. Hematology Recent Labs  Lab 07/04/22 0846  07/04/22 0858  WBC 10.1  --   RBC 4.06  --   HGB 13.6 14.3  HCT 42.2 42.0  MCV 103.9*  --   MCH 33.5  --   MCHC 32.2  --   RDW 14.2  --   PLT 151  --    Thyroid No results for input(s): "TSH", "FREET4" in the last 168 hours. BNP Recent Labs  Lab 07/04/22 0846  BNP 201.2*    DDimer No results for input(s): "DDIMER" in the last 168 hours.   Radiology/Studies:  DG Chest Port 1 View Result Date: 07/04/2022 CLINICAL DATA:  Weakness EXAM: PORTABLE CHEST 1 VIEW COMPARISON:  Portable exam 0950 hours compared to 05/12/2017 FINDINGS: External pacing leads project over chest. Enlargement of cardiac silhouette. Atherosclerotic calcification aorta. Pulmonary vascularity normal. Minimal bibasilar atelectasis. Remaining lungs clear. No pleural effusion, pneumothorax, or acute osseous findings. IMPRESSION: Minimal bibasilar atelectasis. Enlargement of cardiac silhouette. Aortic Atherosclerosis (ICD10-I70.0). Electronically Signed   By: Lavonia Dana M.D.   On: 07/04/2022 10:07   CT Head Wo Contrast Result Date: 07/04/2022 CLINICAL DATA:  Provided history: Head trauma, minor. Facial trauma, blunt. Neck trauma. EXAM: CT HEAD WITHOUT CONTRAST CT MAXILLOFACIAL WITHOUT CONTRAST CT CERVICAL SPINE WITHOUT CONTRAST TECHNIQUE: Multidetector CT imaging of the head, cervical spine, and maxillofacial structures were performed using the standard protocol without intravenous contrast. Multiplanar CT image reconstructions of the cervical spine and maxillofacial structures were also generated. RADIATION DOSE REDUCTION: This exam was  performed according to the departmental dose-optimization program which includes automated exposure control, adjustment of the mA and/or kV according to patient size and/or use of iterative reconstruction technique. COMPARISON:  Brain MRI 10/19/2016. Cervical spine CT 11/11/2016. FINDINGS: CT HEAD FINDINGS Brain: Mild generalized cerebral atrophy. Mild patchy and ill-defined hypoattenuation  within the cerebral white matter, nonspecific but compatible with chronic small vessel ischemic disease. There is no acute intracranial hemorrhage. No demarcated cortical infarct. No extra-axial fluid collection. No evidence of an intracranial mass. No midline shift. Vascular: No hyperdense vessel. Atherosclerotic calcifications. Skull: No fracture or aggressive osseous lesion. CT MAXILLOFACIAL FINDINGS Osseous: No evidence of acute maxillofacial fracture. Orbits: No acute orbital finding. Sinuses: Mild mucosal thickening within the bilateral maxillary sinuses. Soft tissues: No maxillofacial hematoma is appreciable by CT. CT CERVICAL SPINE FINDINGS Alignment: Straightening of the expected cervical lordosis. 1-2 mm grade 1 anterolisthesis at C2-C3 and C3-C4. Skull base and vertebrae: The basion-dental and atlanto-dental intervals are maintained.No evidence of acute fracture to the cervical spine. 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 Soft tissues and spinal canal: No prevertebral fluid or swelling. No visible canal hematoma. Disc levels: Prior C5-C7 ACDF. Solid arthrodesis at C5-C6 and C6-C7. No evidence of acute hardware compromise. Cervical spondylosis with multilevel disc space narrowing, disc bulges/central disc protrusions, uncovertebral hypertrophy and facet arthrosis. Disc space narrowing is greatest at T1-T2 (advanced at this level). No appreciable high-grade spinal canal stenosis. Multilevel bony neural foraminal narrowing. Degenerative changes are also present at the C1-C2 articulation. Upper chest: No consolidation within the imaged lung apices. No visible pneumothorax. Other: Atherosclerotic plaque within the visualized aortic arch, proximal major branch vessels of the neck and carotid arteries. IMPRESSION: CT head: 1. No evidence of acute intracranial abnormality. 2. Mild chronic small vessel ischemic changes within the cerebral white matter. 3. Mild generalized cerebral  atrophy. CT maxillofacial: No evidence of acute maxillofacial fracture. CT cervical spine: 1. No evidence of acute fracture to the cervical spin 2. 1-2 mm grade 1 anterolisthesis at C2-C3 and C3-C4. 3. Prior C5-C7 ACDF. No evidence of acute hardware compromise. 4. Cervical spondylosis, as described. 5. 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 Electronically Signed   By: Kellie Simmering D.O.   On: 07/04/2022 09:46   CT Cervical Spine Wo Contrast  Result Date: 07/04/2022 CLINICAL DATA:  Provided history: Head trauma, minor. Facial trauma, blunt. Neck trauma. EXAM: CT HEAD WITHOUT CONTRAST CT MAXILLOFACIAL WITHOUT CONTRAST CT CERVICAL SPINE WITHOUT CONTRAST TECHNIQUE: Multidetector CT imaging of the head, cervical spine, and maxillofacial structures were performed using the standard protocol without intravenous contrast. Multiplanar CT image reconstructions of the cervical spine and maxillofacial structures were also generated. RADIATION DOSE REDUCTION: This exam was performed according to the departmental dose-optimization program which includes automated exposure control, adjustment of the mA and/or kV according to patient size and/or use of iterative reconstruction technique. COMPARISON:  Brain MRI 10/19/2016. Cervical spine CT 11/11/2016. FINDINGS: CT HEAD FINDINGS Brain: Mild generalized cerebral atrophy. Mild patchy and ill-defined hypoattenuation within the cerebral white matter, nonspecific but compatible with chronic small vessel ischemic disease. There is no acute intracranial hemorrhage. No demarcated cortical infarct. No extra-axial fluid collection. No evidence of an intracranial mass. No midline shift. Vascular: No hyperdense vessel. Atherosclerotic calcifications. Skull: No fracture or aggressive osseous lesion. CT MAXILLOFACIAL FINDINGS Osseous: No evidence of acute maxillofacial fracture. Orbits: No acute orbital finding. Sinuses: Mild mucosal thickening within the  bilateral maxillary sinuses.  Soft tissues: No maxillofacial hematoma is appreciable by CT. CT CERVICAL SPINE FINDINGS Alignment: Straightening of the expected cervical lordosis. 1-2 mm grade 1 anterolisthesis at C2-C3 and C3-C4. Skull base and vertebrae: The basion-dental and atlanto-dental intervals are maintained.No evidence of acute fracture to the cervical spine. 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 Soft tissues and spinal canal: No prevertebral fluid or swelling. No visible canal hematoma. Disc levels: Prior C5-C7 ACDF. Solid arthrodesis at C5-C6 and C6-C7. No evidence of acute hardware compromise. Cervical spondylosis with multilevel disc space narrowing, disc bulges/central disc protrusions, uncovertebral hypertrophy and facet arthrosis. Disc space narrowing is greatest at T1-T2 (advanced at this level). No appreciable high-grade spinal canal stenosis. Multilevel bony neural foraminal narrowing. Degenerative changes are also present at the C1-C2 articulation. Upper chest: No consolidation within the imaged lung apices. No visible pneumothorax. Other: Atherosclerotic plaque within the visualized aortic arch, proximal major branch vessels of the neck and carotid arteries. IMPRESSION: CT head: 1. No evidence of acute intracranial abnormality. 2. Mild chronic small vessel ischemic changes within the cerebral white matter. 3. Mild generalized cerebral atrophy. CT maxillofacial: No evidence of acute maxillofacial fracture. CT cervical spine: 1. No evidence of acute fracture to the cervical spin 2. 1-2 mm grade 1 anterolisthesis at C2-C3 and C3-C4. 3. Prior C5-C7 ACDF. No evidence of acute hardware compromise. 4. Cervical spondylosis, as described. 5. 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 Electronically Signed   By: Kellie Simmering D.O.   On: 07/04/2022 09:46   CT Maxillofacial Wo Contrast  Result Date: 07/04/2022 CLINICAL DATA:  Provided history:  Head trauma, minor. Facial trauma, blunt. Neck trauma. EXAM: CT HEAD WITHOUT CONTRAST CT MAXILLOFACIAL WITHOUT CONTRAST CT CERVICAL SPINE WITHOUT CONTRAST TECHNIQUE: Multidetector CT imaging of the head, cervical spine, and maxillofacial structures were performed using the standard protocol without intravenous contrast. Multiplanar CT image reconstructions of the cervical spine and maxillofacial structures were also generated. RADIATION DOSE REDUCTION: This exam was performed according to the departmental dose-optimization program which includes automated exposure control, adjustment of the mA and/or kV according to patient size and/or use of iterative reconstruction technique. COMPARISON:  Brain MRI 10/19/2016. Cervical spine CT 11/11/2016. FINDINGS: CT HEAD FINDINGS Brain: Mild generalized cerebral atrophy. Mild patchy and ill-defined hypoattenuation within the cerebral white matter, nonspecific but compatible with chronic small vessel ischemic disease. There is no acute intracranial hemorrhage. No demarcated cortical infarct. No extra-axial fluid collection. No evidence of an intracranial mass. No midline shift. Vascular: No hyperdense vessel. Atherosclerotic calcifications. Skull: No fracture or aggressive osseous lesion. CT MAXILLOFACIAL FINDINGS Osseous: No evidence of acute maxillofacial fracture. Orbits: No acute orbital finding. Sinuses: Mild mucosal thickening within the bilateral maxillary sinuses. Soft tissues: No maxillofacial hematoma is appreciable by CT. CT CERVICAL SPINE FINDINGS Alignment: Straightening of the expected cervical lordosis. 1-2 mm grade 1 anterolisthesis at C2-C3 and C3-C4. Skull base and vertebrae: The basion-dental and atlanto-dental intervals are maintained.No evidence of acute fracture to the cervical spine. 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 Soft tissues and spinal canal: No prevertebral fluid or swelling. No visible canal hematoma. Disc  levels: Prior C5-C7 ACDF. Solid arthrodesis at C5-C6 and C6-C7. No evidence of acute hardware compromise. Cervical spondylosis with multilevel disc space narrowing, disc bulges/central disc protrusions, uncovertebral hypertrophy and facet arthrosis. Disc space narrowing is greatest at T1-T2 (advanced at this level). No appreciable high-grade spinal canal stenosis. Multilevel bony neural  foraminal narrowing. Degenerative changes are also present at the C1-C2 articulation. Upper chest: No consolidation within the imaged lung apices. No visible pneumothorax. Other: Atherosclerotic plaque within the visualized aortic arch, proximal major branch vessels of the neck and carotid arteries. IMPRESSION: CT head: 1. No evidence of acute intracranial abnormality. 2. Mild chronic small vessel ischemic changes within the cerebral white matter. 3. Mild generalized cerebral atrophy. CT maxillofacial: No evidence of acute maxillofacial fracture. CT cervical spine: 1. No evidence of acute fracture to the cervical spin 2. 1-2 mm grade 1 anterolisthesis at C2-C3 and C3-C4. 3. Prior C5-C7 ACDF. No evidence of acute hardware compromise. 4. Cervical spondylosis, as described. 5. 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 Electronically Signed   By: Kellie Simmering D.O.   On: 07/04/2022 09:46     Assessment and Plan:   Symptomatic bradycardia Hold all nodal drugs She will need temp pacing wire placed given ongoing bradycardia post atropine She has BP, skin is warm, dry, not lethargic Pacer pads are in place  Allow drug wash out Not convinced she will need permanent pacing given meds on board  Atrial fibrillation, paroxysmal CHA2DSVasc is 5 Hold eliquis for now pending her clinical course +/- need for further procedures  HTN Hold meds for now Follow  DM Not on meds  Dr. Quentin Ore has seen the patient Admit to 2H   Risk Assessment/Risk Scores:    For questions or updates, please contact  [place] Please consult [email or web address] for contact info under     Signed, Baldwin Jamaica, PA-C  07/04/2022 10:35 AM

## 2022-07-04 NOTE — ED Notes (Signed)
Pt placed on zoll pads and charge RN made aware of pts HR.

## 2022-07-04 NOTE — ED Notes (Signed)
Patient transported to CT by this RN 

## 2022-07-04 NOTE — ED Provider Notes (Signed)
I was called to the bedside as patient was emergently feeling ill and was trying to vomit.  She was ill-appearing and it was hard to tell but her heart rate was around 20.  She was given 0.5 mg atropine and did seem to improve.  Her heart rate is still quite slow and irregular but her blood pressure is better and she is now asymptomatic.  Cardiology was called and EP and cardiology saw her at the bedside.   Sherwood Gambler, MD 07/04/22 1023

## 2022-07-05 ENCOUNTER — Other Ambulatory Visit: Payer: Self-pay

## 2022-07-05 ENCOUNTER — Encounter (HOSPITAL_COMMUNITY): Payer: Self-pay | Admitting: Cardiology

## 2022-07-05 ENCOUNTER — Telehealth: Payer: Self-pay

## 2022-07-05 DIAGNOSIS — Z01812 Encounter for preprocedural laboratory examination: Secondary | ICD-10-CM

## 2022-07-05 DIAGNOSIS — I480 Paroxysmal atrial fibrillation: Secondary | ICD-10-CM

## 2022-07-05 DIAGNOSIS — R001 Bradycardia, unspecified: Secondary | ICD-10-CM | POA: Diagnosis not present

## 2022-07-05 LAB — BASIC METABOLIC PANEL
Anion gap: 12 (ref 5–15)
BUN: 16 mg/dL (ref 8–23)
CO2: 24 mmol/L (ref 22–32)
Calcium: 9 mg/dL (ref 8.9–10.3)
Chloride: 103 mmol/L (ref 98–111)
Creatinine, Ser: 0.7 mg/dL (ref 0.44–1.00)
GFR, Estimated: >60 mL/min (ref >60)
Glucose, Bld: 163 mg/dL — ABNORMAL HIGH (ref 70–99)
Potassium: 4.1 mmol/L (ref 3.5–5.1)
Sodium: 139 mmol/L (ref 135–145)

## 2022-07-05 MED ORDER — HEPARIN (PORCINE) 25000 UT/250ML-% IV SOLN
1150.0000 [IU]/h | INTRAVENOUS | Status: DC
  Administered 2022-07-05: 950 [IU]/h via INTRAVENOUS
  Filled 2022-07-05: qty 250

## 2022-07-05 MED ORDER — HYDRALAZINE HCL 20 MG/ML IJ SOLN: 10.0000 mg | INTRAMUSCULAR | Status: DC | PRN

## 2022-07-05 NOTE — Progress Notes (Signed)
ANTICOAGULATION CONSULT NOTE - Initial Consult  Pharmacy Consult for Heparin Indication: atrial fibrillation  No Known Allergies  Patient Measurements: Height: '5\' 3"'$  (160 cm) Weight: 70.3 kg (154 lb 15.7 oz) IBW/kg (Calculated) : 52.4 Heparin Dosing Weight: 67 kg  Vital Signs: Temp: 98.5 F (36.9 C) (12/22 1620) Temp Source: Oral (12/22 1620) BP: 114/73 (12/22 1000) Pulse Rate: 63 (12/22 1000)  Labs: Recent Labs    07/04/22 0846 07/04/22 0858 07/05/22 0454  HGB 13.6 14.3  --   HCT 42.2 42.0  --   PLT 151  --   --   CREATININE 1.21* 1.20* 0.70  TROPONINIHS 7  --   --     Estimated Creatinine Clearance: 49.3 mL/min (by C-G formula based on SCr of 0.7 mg/dL).   Medical History: Past Medical History:  Diagnosis Date   DDD (degenerative disc disease), lumbar    Fatty liver disease, nonalcoholic    CT AB 6378   Fibrocystic breast disease    Hyperlipidemia    Hypertension    Type II or unspecified type diabetes mellitus without mention of complication, not stated as uncontrolled    Vitamin D deficiency     Medications:  Medications Prior to Admission  Medication Sig Dispense Refill Last Dose   apixaban (ELIQUIS) 5 MG TABS tablet Take 1 tablet (5 mg total) by mouth 2 (two) times daily. 60 tablet 5 07/03/2022 at 1830   aspirin EC 81 MG tablet Take 81 mg by mouth daily. Swallow whole.   07/03/2022   atenolol (TENORMIN) 100 MG tablet Take  1 tablet  Daily  for BP                                                                    /                                TAKE                          BY                    MOUTH (Patient taking differently: Take 50 mg by mouth every evening.) 90 tablet 3 07/03/2022   atorvastatin (LIPITOR) 40 MG tablet Take 1 tablet daily as directed for cholesterol. (Patient taking differently: Take 40 mg by mouth every evening.) 90 tablet 3 07/03/2022   Brimonidine Tartrate (LUMIFY) 0.025 % SOLN Place 1 drop into both eyes 2 (two) times daily as  needed (eye redness/irritation.).   Past Week   buPROPion (WELLBUTRIN XL) 150 MG 24 hr tablet Take 150 mg by mouth in the morning.   07/03/2022   Cholecalciferol (VITAMIN D-3 PO) Take 1 tablet by mouth in the morning.   07/03/2022   flecainide (TAMBOCOR) 50 MG tablet Take 1 tablet (50 mg total) by mouth 2 (two) times daily. 180 tablet 3    meloxicam (MOBIC) 15 MG tablet Take 1/2 to 1 tablet daily with food for Pain / Inflammation & Limit to 5 days/week to prevent Kidney Damage (Patient taking differently: Take 15 mg by mouth every Monday, Wednesday, and Friday.) 90 tablet 1 07/03/2022  Multiple Vitamin (MULTIVITAMIN WITH MINERALS) TABS tablet Take 1 tablet by mouth in the morning.   07/03/2022   olmesartan (BENICAR) 40 MG tablet Take  1 tablet  Daily for BP                                                                     /                                   TAKE                                  BY                               MOUTH (Patient taking differently: Take 40 mg by mouth daily.) 90 tablet 3 07/03/2022   omeprazole (PRILOSEC) 20 MG capsule Take  1 capsule  Daily  to Prevent Heartburn & Indigestion                                    /                                TAKE                          BY                       MOUTH (Patient taking differently: Take 20 mg by mouth daily.) 90 capsule 3 07/04/2022   vitamin B-12 (CYANOCOBALAMIN) 1000 MCG tablet Take 1,000 mcg by mouth daily.   07/03/2022   diltiazem (CARTIA XT) 240 MG 24 hr capsule Take  1 capsule  Daily  for BP & Heart Rhythm  ( STOP VERAPAMIL ) (Patient taking differently: Take 240 mg by mouth every evening.) 90 capsule 1     Assessment: 86 y.o. F presents with symptomatic bradycardia. S/p temp wire this morning.  Pt on eliquis PTA for afib - holding in case further procedures needed. Last dose 12/20 1830. Noted plan for DCCV 07/19/21. To begin heparin gtt while apixaban on hold. Apixaban may still be affecting heparin levels so will  utilize aPTT for monitoring until levels correlate. CBC ok on admission.  Goal of Therapy:  Heparin level 0.3-0.7 units/ml aPTT 66-102 seconds Monitor platelets by anticoagulation protocol: Yes   Plan:  Heparin gtt at 950 units/hr Will f/u heparin level and aPTT in 8 hours Daily heparin level, aPTT, and CBC  Sherlon Handing, PharmD, BCPS Please see amion for complete clinical pharmacist phone list 07/05/2022,6:55 PM

## 2022-07-05 NOTE — Progress Notes (Signed)
Rounding Note    Patient Name: Debra Collins Date of Encounter: 07/05/2022  [facility] Cardiologist: Dr. Sallyanne Kuster  Subjective   Feels very well  Inpatient Medications    Scheduled Meds:  atorvastatin  40 mg Oral QPM   atropine  0.5 mg Intravenous Once   buPROPion  150 mg Oral q AM   Chlorhexidine Gluconate Cloth  6 each Topical Daily   pantoprazole  40 mg Oral Daily   Continuous Infusions:  PRN Meds: acetaminophen, hydrALAZINE, ondansetron (ZOFRAN) IV, mouth rinse   Vital Signs    Vitals:   07/05/22 0400 07/05/22 0500 07/05/22 0530 07/05/22 0600  BP: (!) 146/75 (!) 81/36 (!) 151/59 (!) 116/92  Pulse: 64 69 65 65  Resp: '17 20 16 18  '$ Temp:      TempSrc:      SpO2: 94% 93% 94% 93%  Weight:      Height:        Intake/Output Summary (Last 24 hours) at 07/05/2022 0708 Last data filed at 07/05/2022 0540 Gross per 24 hour  Intake --  Output 700 ml  Net -700 ml      07/04/2022    3:06 PM 06/25/2022    7:32 AM 06/20/2022   10:19 AM  Last 3 Weights  Weight (lbs) 154 lb 15.7 oz 160 lb 160 lb 9.6 oz  Weight (kg) 70.3 kg 72.576 kg 72.848 kg      Telemetry    SR, 1st degree AVblock, 70's, BBB - Personally Reviewed  ECG    No new EKGs - Personally Reviewed  Physical Exam   GEN: No acute distress.   Neck: No JVD, TVP site is stable Cardiac: RRR, no murmurs, rubs, or gallops.  Respiratory: slightly decreased at the bases. GI: Soft, nontender, non-distended  MS: No edema; No deformity. Neuro:  Nonfocal  Psych: Normal affect   Labs    High Sensitivity Troponin:   Recent Labs  Lab 07/04/22 0846  TROPONINIHS 7     Chemistry Recent Labs  Lab 07/04/22 0846 07/04/22 0858 07/05/22 0454  NA 134* 136 139  K 5.4* 5.4* 4.1  CL 103 101 103  CO2 21*  --  24  GLUCOSE 338* 334* 163*  BUN 19 25* 16  CREATININE 1.21* 1.20* 0.70  CALCIUM 8.6*  --  9.0  MG 1.9  --   --   GFRNONAA 44*  --  >60  ANIONGAP 10  --  12    Lipids No results for  input(s): "CHOL", "TRIG", "HDL", "LABVLDL", "LDLCALC", "CHOLHDL" in the last 168 hours.  Hematology Recent Labs  Lab 07/04/22 0846 07/04/22 0858  WBC 10.1  --   RBC 4.06  --   HGB 13.6 14.3  HCT 42.2 42.0  MCV 103.9*  --   MCH 33.5  --   MCHC 32.2  --   RDW 14.2  --   PLT 151  --    Thyroid No results for input(s): "TSH", "FREET4" in the last 168 hours.  BNP Recent Labs  Lab 07/04/22 0846  BNP 201.2*    DDimer No results for input(s): "DDIMER" in the last 168 hours.   Radiology      Cardiac Studies    06/26/22: TTE  1. Left ventricular ejection fraction, by estimation, is 60 to 65%. The  left ventricle has normal function. The left ventricle has no regional  wall motion abnormalities. Left ventricular diastolic function could not  be evaluated.   2. Right ventricular  systolic function is normal. The right ventricular  size is normal. There is mildly elevated pulmonary artery systolic  pressure. The estimated right ventricular systolic pressure is 60.4 mmHg.   3. The mitral valve is normal in structure. No evidence of mitral valve  regurgitation. No evidence of mitral stenosis.   4. The aortic valve is tricuspid. Aortic valve regurgitation is not  visualized. Aortic valve sclerosis/calcification is present, without any  evidence of aortic stenosis.   5. There is mild dilatation of the ascending aorta, measuring 39 mm.   6. The inferior vena cava is normal in size with greater than 50%  respiratory variability, suggesting right atrial pressure of 3 mmHg.      06/25/22: stress myoview   The study is normal. The study is low risk.   No ST deviation was noted.  Right bundle branch block noted as baseline with atrial fibrillation.   LV perfusion is normal. There is no evidence of ischemia. There is no evidence of infarction.   Left ventricular function is normal. Nuclear stress EF: 67 %. The left ventricular ejection fraction is hyperdynamic (>65%). End diastolic  cavity size is normal. End systolic cavity size is normal.   Patient Profile     86 y.o. female  with HTN, HLD, AFib, DM admitted with profound weakness/near syncope and marked bradycardia  Assessment & Plan    Symptomatic bradycardia Hold all nodal drugs   She has regained AV conduction with minimal V pacing AKI is improved Mild hyperkalemia also better Turned pacing to backup 38 today  Ok for OOB   Atrial fibrillation, paroxysmal CHA2DSVasc is 5 Hold eliquis for now pending her clinical course +/- need for further procedures   HTN Hold meds for now Looks OK Follow    DM Not on meds    For questions or updates, please contact [place] Please consult [email or web address] for contact info under        Signed, Baldwin [place], PA-C  07/05/2022, 7:08 AM

## 2022-07-05 NOTE — TOC Initial Note (Signed)
Transition of Care [facility]) - Initial/Assessment Note    Patient Details  Name: Debra Collins MRN: [id number] Date of Birth: [birth year]  [facility] CM/SW Contact:    Erenest Rasher, RN Phone Number: 07/05/2022, 3:30 PM  Clinical Narrative:                  TOC CM spoke to pt at bedside. States her adult children will assist at home as needed. She was independent prior to hospital stay. Will continue to follow for dc needs.    Expected Discharge Plan: Home/Self Care Barriers to Discharge: Continued Medical Work up   Patient Goals and CMS Choice Patient states their goals for this hospitalization and ongoing recovery are:: wants to remain independent CMS Medicare.gov Compare Post Acute Care list provided to:: Patient        Expected Discharge Plan and Services   Discharge Planning Services: CM Consult                                          Prior Living Arrangements/Services   Lives with:: Spouse Patient language and need for interpreter reviewed:: Yes Do you feel safe going back to the place where you live?: Yes      Need for Family Participation in Patient Care: No (Comment) Care giver support system in place?: Yes (comment)   Criminal Activity/Legal Involvement Pertinent to Current Situation/Hospitalization: No - Comment as needed  Activities of Daily Living Home Assistive Devices/Equipment: None ADL Screening (condition at time of admission) Patient's cognitive ability adequate to safely complete daily activities?: Yes Is the patient deaf or have difficulty hearing?: No Does the patient have difficulty seeing, even when wearing glasses/contacts?: No Does the patient have difficulty concentrating, remembering, or making decisions?: No Patient able to express need for assistance with ADLs?: Yes Does the patient have difficulty dressing or bathing?: No Independently performs ADLs?: Yes (appropriate for developmental age) Does the  patient have difficulty walking or climbing stairs?: No Weakness of Legs: None Weakness of Arms/Hands: None  Permission Sought/Granted Permission sought to share information with : Case Manager, Family Supports, PCP Permission granted to share information with : Yes, Verbal Permission Granted  Share Information with NAME: Philis Nettle     Permission granted to share info w Relationship: daughter  Permission granted to share info w Contact Information: [phone]  Emotional Assessment Appearance:: Appears stated age Attitude/Demeanor/Rapport: Engaged Affect (typically observed): Accepting Orientation: : Oriented to Self, Oriented to Place, Oriented to  Time, Oriented to Situation   Psych Involvement: No (comment)  Admission diagnosis:  Symptomatic bradycardia [R00.1] Patient Active Problem List   Diagnosis Date Noted   Symptomatic bradycardia 07/04/2022   Paroxysmal atrial fibrillation ([place]) 06/13/2022   Situational depression 10/12/2021   Paresthesia and pain of both upper extremities 01/30/2021   Aortic atherosclerosis ([place]) by CXR 05/12/2017 07/11/2020   Hyperlipidemia associated with type 2 diabetes mellitus ([place]) 06/22/2019   CKD stage 2 due to type 2 diabetes mellitus ([place]) 03/15/2019   Unsteady gait 03/12/2018   Gastroesophageal reflux disease without esophagitis 03/11/2018   Overweight (BMI 25.0-29.9) 08/11/2017   Chronic obstructive pulmonary disease ([place]) 01/29/2017   Type 2 diabetes mellitus ([place]) 08/16/2014   Essential hypertension 07/25/2013   Vitamin D deficiency 07/25/2013   PCP:  Unk Pinto, MD Pharmacy:   [id number] - [place] [address] [place] [address] [place] [address] Phone: [phone] Fax: [phone]     Social Determinants of Health (SDOH) Social History: SDOH Screenings   Food Insecurity: No Food Insecurity (07/04/2022)  Housing: Low Risk  (07/04/2022)  Transportation Needs: No  Transportation Needs (07/04/2022)  Utilities: Not At Risk (07/04/2022)  Depression (PHQ2-9): Low Risk  (05/05/2022)  Tobacco Use: Medium Risk (07/05/2022)   SDOH Interventions:     Readmission Risk Interventions     No data to display

## 2022-07-05 NOTE — Telephone Encounter (Signed)
    Dear [name]  You are scheduled for a Cardioversion on Friday, January 5 with Dr. Sallyanne Kuster.  Please arrive at the [facility] (Main Entrance A) at [facility]: [address] [place], [place] [address] at 8:30 AM.   DIET:  Nothing to eat or drink after midnight except a sip of water with medications (see medication instructions below)  MEDICATION INSTRUCTIONS: Continue taking your anticoagulant(blood thinner): Apixaban (Eliquis).  You will need to continue this after your procedure until you are told by your provider that it is safe to stop.    LABS:Bmet and cbc to be done 07/16/22.   FYI:  For your safety, and to allow [place] to monitor your vital signs accurately during the surgery/procedure we request: If you have artificial nails, gel coating, SNS etc, please have those removed prior to your surgery/procedure. Not having the nail coverings /polish removed may result in cancellation or delay of your surgery/procedure.  You must have a responsible person to drive you home and stay in the waiting area during your procedure. Failure to do so could result in cancellation.  Bring your insurance cards.  *Special Note: Every effort is made to have your procedure done on time. Occasionally there are emergencies that occur at the hospital that may cause delays. Please be patient if a delay does occur.

## 2022-07-05 NOTE — Plan of Care (Signed)
Personally seen and examined.  Briefly 86 yo F with PAF s/p syncope and marked brady cardia on flecainide. S/p Temp wire and Sinus this Am Overnight went to PAF. Rates ~ 100s; she has no symptoms. She is VVI and is not pacing She has hold of eliquis in case she needed PPM placement.  No evidence of R IJ Hematoma. EP note is pending signed; notes no eliquis for procedural consideration and does not specify heparin.  She has planned DCCV 07/19/21 and has been asked to not stop her anticoagulation.  Will start heparin. No AV nodal agents.  Rudean Haskell, MD [facility] [place], [address] [place], [place] [address] [phone]  6:53 PM

## 2022-07-06 ENCOUNTER — Other Ambulatory Visit: Payer: Self-pay | Admitting: Student

## 2022-07-06 DIAGNOSIS — I4819 Other persistent atrial fibrillation: Secondary | ICD-10-CM

## 2022-07-06 DIAGNOSIS — E119 Type 2 diabetes mellitus without complications: Secondary | ICD-10-CM | POA: Diagnosis not present

## 2022-07-06 DIAGNOSIS — R001 Bradycardia, unspecified: Secondary | ICD-10-CM | POA: Diagnosis not present

## 2022-07-06 DIAGNOSIS — N179 Acute kidney failure, unspecified: Secondary | ICD-10-CM

## 2022-07-06 DIAGNOSIS — I10 Essential (primary) hypertension: Secondary | ICD-10-CM

## 2022-07-06 LAB — BASIC METABOLIC PANEL
Anion gap: 8 (ref 5–15)
BUN: 12 mg/dL (ref 8–23)
CO2: 28 mmol/L (ref 22–32)
Calcium: 9.1 mg/dL (ref 8.9–10.3)
Chloride: 104 mmol/L (ref 98–111)
Creatinine, Ser: 0.59 mg/dL (ref 0.44–1.00)
GFR, Estimated: >60 mL/min (ref >60)
Glucose, Bld: 154 mg/dL — ABNORMAL HIGH (ref 70–99)
Potassium: 4 mmol/L (ref 3.5–5.1)
Sodium: 140 mmol/L (ref 135–145)

## 2022-07-06 LAB — CBC
HCT: 44.7 % (ref 36.0–46.0)
Hemoglobin: 14.7 g/dL (ref 12.0–15.0)
MCH: 33.1 pg (ref 26.0–34.0)
MCHC: 32.9 g/dL (ref 30.0–36.0)
MCV: 100.7 fL — ABNORMAL HIGH (ref 80.0–100.0)
Platelets: 130 10*3/uL — ABNORMAL LOW (ref 150–400)
RBC: 4.44 MIL/uL (ref 3.87–5.11)
RDW: 13.9 % (ref 11.5–15.5)
WBC: 7.8 10*3/uL (ref 4.0–10.5)
nRBC: 0 % (ref 0.0–0.2)

## 2022-07-06 LAB — HEPARIN LEVEL (UNFRACTIONATED): Heparin Unfractionated: 0.54 IU/mL (ref 0.30–0.70)

## 2022-07-06 LAB — APTT: aPTT: 49 seconds — ABNORMAL HIGH (ref 24–36)

## 2022-07-06 LAB — GLUCOSE, CAPILLARY
Glucose-Capillary: 240 mg/dL — ABNORMAL HIGH (ref 70–99)
Glucose-Capillary: 141 mg/dL — ABNORMAL HIGH (ref 70–99)

## 2022-07-06 MED ORDER — AMIODARONE HCL 200 MG PO TABS: 200.0000 mg | ORAL_TABLET | Freq: Two times a day (BID) | ORAL | 2 refills | Status: DC

## 2022-07-06 MED ORDER — INSULIN ASPART 100 UNIT/ML IJ SOLN: 0.0000 [IU] | Freq: Every day | INTRAMUSCULAR | Status: DC

## 2022-07-06 MED ORDER — IRBESARTAN 150 MG PO TABS
150.0000 mg | ORAL_TABLET | Freq: Every day | ORAL | Status: DC
  Administered 2022-07-06: 150 mg via ORAL
  Filled 2022-07-06: qty 1

## 2022-07-06 MED ORDER — AMLODIPINE BESYLATE 2.5 MG PO TABS: 2.5000 mg | ORAL_TABLET | Freq: Every day | ORAL | 2 refills | Status: DC

## 2022-07-06 MED ORDER — AMIODARONE HCL 200 MG PO TABS
200.0000 mg | ORAL_TABLET | Freq: Two times a day (BID) | ORAL | Status: DC
  Administered 2022-07-06: 200 mg via ORAL
  Filled 2022-07-06: qty 1

## 2022-07-06 MED ORDER — INSULIN ASPART 100 UNIT/ML IJ SOLN
0.0000 [IU] | Freq: Three times a day (TID) | INTRAMUSCULAR | Status: DC
  Administered 2022-07-06: 2 [IU] via SUBCUTANEOUS

## 2022-07-06 MED ORDER — METFORMIN HCL ER (OSM) 500 MG PO TB24: 500.0000 mg | ORAL_TABLET | Freq: Two times a day (BID) | ORAL | 2 refills | Status: DC

## 2022-07-06 MED ORDER — AMIODARONE HCL 200 MG PO TABS: 400.0000 mg | ORAL_TABLET | Freq: Two times a day (BID) | ORAL | Status: DC

## 2022-07-06 MED ORDER — APIXABAN 5 MG PO TABS
5.0000 mg | ORAL_TABLET | Freq: Two times a day (BID) | ORAL | Status: DC
  Administered 2022-07-06: 5 mg via ORAL
  Filled 2022-07-06: qty 1

## 2022-07-06 NOTE — Progress Notes (Signed)
[place] for Heparin> apixaban Indication: atrial fibrillation  No Known Allergies  Patient Measurements: Height: '5\' 3"'$  (160 cm) Weight: 70.3 kg (154 lb 15.7 oz) IBW/kg (Calculated) : 52.4 Heparin Dosing Weight: 67 kg  Vital Signs: Temp: 97.9 F (36.6 C) (12/23 0802) Temp Source: Oral (12/23 0802) BP: 135/121 (12/23 0800) Pulse Rate: 105 (12/23 0800)  Labs: Recent Labs    07/04/22 0846 07/04/22 0858 07/05/22 0454 07/06/22 0243  HGB 13.6 14.3  --  14.7  HCT 42.2 42.0  --  44.7  PLT 151  --   --  130*  APTT  --   --   --  49*  HEPARINUNFRC  --   --   --  0.54  CREATININE 1.21* 1.20* 0.70 0.59  TROPONINIHS 7  --   --   --      Estimated Creatinine Clearance: 49.3 mL/min (by C-G formula based on SCr of 0.59 mg/dL).   Medical History: Past Medical History:  Diagnosis Date   DDD (degenerative disc disease), lumbar    Fatty liver disease, nonalcoholic    CT AB 0539   Fibrocystic breast disease    Hyperlipidemia    Hypertension    Type II or unspecified type diabetes mellitus without mention of complication, not stated as uncontrolled    Vitamin D deficiency     Medications:  Medications Prior to Admission  Medication Sig Dispense Refill Last Dose   apixaban (ELIQUIS) 5 MG TABS tablet Take 1 tablet (5 mg total) by mouth 2 (two) times daily. 60 tablet 5 07/03/2022 at 1830   aspirin EC 81 MG tablet Take 81 mg by mouth daily. Swallow whole.   07/03/2022   atenolol (TENORMIN) 100 MG tablet Take  1 tablet  Daily  for BP                                                                    /                                TAKE                          BY                    MOUTH (Patient taking differently: Take 50 mg by mouth every evening.) 90 tablet 3 07/03/2022   atorvastatin (LIPITOR) 40 MG tablet Take 1 tablet daily as directed for cholesterol. (Patient taking differently: Take 40 mg by mouth every evening.) 90 tablet 3 07/03/2022    Brimonidine Tartrate (LUMIFY) 0.025 % SOLN Place 1 drop into both eyes 2 (two) times daily as needed (eye redness/irritation.).   Past Week   buPROPion (WELLBUTRIN XL) 150 MG 24 hr tablet Take 150 mg by mouth in the morning.   07/03/2022   Cholecalciferol (VITAMIN D-3 PO) Take 1 tablet by mouth in the morning.   07/03/2022   flecainide (TAMBOCOR) 50 MG tablet Take 1 tablet (50 mg total) by mouth 2 (two) times daily. 180 tablet 3    meloxicam (MOBIC) 15 MG tablet Take 1/2 to 1 tablet daily  with food for Pain / Inflammation & Limit to 5 days/week to prevent Kidney Damage (Patient taking differently: Take 15 mg by mouth every Monday, Wednesday, and Friday.) 90 tablet 1 07/03/2022   Multiple Vitamin (MULTIVITAMIN WITH MINERALS) TABS tablet Take 1 tablet by mouth in the morning.   07/03/2022   olmesartan (BENICAR) 40 MG tablet Take  1 tablet  Daily for BP                                                                     /                                   TAKE                                  BY                               MOUTH (Patient taking differently: Take 40 mg by mouth daily.) 90 tablet 3 07/03/2022   omeprazole (PRILOSEC) 20 MG capsule Take  1 capsule  Daily  to Prevent Heartburn & Indigestion                                    /                                TAKE                          BY                       MOUTH (Patient taking differently: Take 20 mg by mouth daily.) 90 capsule 3 07/04/2022   vitamin B-12 (CYANOCOBALAMIN) 1000 MCG tablet Take 1,000 mcg by mouth daily.   07/03/2022   diltiazem (CARTIA XT) 240 MG 24 hr capsule Take  1 capsule  Daily  for BP & Heart Rhythm  ( STOP VERAPAMIL ) (Patient taking differently: Take 240 mg by mouth every evening.) 90 capsule 1     Assessment: 86 y.o. F presents with symptomatic bradycardia. S/p temp wire this morning.  Pt on eliquis PTA for afib - holding in case further procedures needed. Last dose 12/20 1830. Noted plan for DCCV 07/19/21. She  is on heparin and plans to transition back top apixaban -SCr= 0.59, wt ~ 70kg  Goal of Therapy:  Heparin level 0.3-0.7 units/ml aPTT 66-102 seconds Monitor platelets by anticoagulation protocol: Yes   Plan:  -resume apixaban '5mg'$  po bid  Hildred Laser, PharmD Clinical Pharmacist **Pharmacist phone directory can now be found on [email or web address] ([email or web address]).  Listed under [place].

## 2022-07-06 NOTE — Progress Notes (Signed)
Rounding Note    Patient Name: Debra Collins Date of Encounter: 07/06/2022  [facility] Cardiologist: Dr. Sallyanne Kuster  Subjective   Without complaint.  No chest pain or shortness of breath and unaware of palpitations Apprised to find that her blood sugar was 300 hemoglobin A1c 3/23 was 6.4  Inpatient Medications    Scheduled Meds:  atorvastatin  40 mg Oral QPM   atropine  0.5 mg Intravenous Once   buPROPion  150 mg Oral q AM   Chlorhexidine Gluconate Cloth  6 each Topical Daily   insulin aspart  0-15 Units Subcutaneous TID WC   insulin aspart  0-5 Units Subcutaneous QHS   irbesartan  150 mg Oral Daily   pantoprazole  40 mg Oral Daily   Continuous Infusions:  heparin 1,150 Units/hr (07/06/22 0800)   PRN Meds: acetaminophen, hydrALAZINE, ondansetron (ZOFRAN) IV, mouth rinse   Vital Signs    Vitals:   07/06/22 0600 07/06/22 0700 07/06/22 0800 07/06/22 0802  BP: (!) 149/73 (!) 141/130 (!) 135/121   Pulse: 80 97 (!) 105   Resp: 14 19 (!) 24   Temp:    97.9 F (36.6 C)  TempSrc:    Oral  SpO2: 95% 94% 95%   Weight:      Height:        Intake/Output Summary (Last 24 hours) at 07/06/2022 0855 Last data filed at 07/06/2022 0800 Gross per 24 hour  Intake 121.69 ml  Output 1500 ml  Net -1378.31 ml       07/04/2022    3:06 PM 06/25/2022    7:32 AM 06/20/2022   10:19 AM  Last 3 Weights  Weight (lbs) 154 lb 15.7 oz 160 lb 160 lb 9.6 oz  Weight (kg) 70.3 kg 72.576 kg 72.848 kg      Telemetry    Recurrent afib  ECG     Personally Reviewed  from 2021 and 2022 with QTc > 440 msec  Physical Exam   Well developed and nourished in no acute distress HENT normal Neck supple w  Carotids brisk and full without bruits Clear Irregularly irregular rate and rhythm with rapid ventricular response, no murmurs or gallops Abd-soft with active BS without hepatomegaly No Clubbing cyanosis edema Skin-warm and dry A & Oriented  Grossly normal sensory and motor  function   Labs    High Sensitivity Troponin:   Recent Labs  Lab 07/04/22 0846  TROPONINIHS 7      Chemistry Recent Labs  Lab 07/04/22 0846 07/04/22 0858 07/05/22 0454 07/06/22 0243  NA 134* 136 139 140  K 5.4* 5.4* 4.1 4.0  CL 103 101 103 104  CO2 21*  --  24 28  GLUCOSE 338* 334* 163* 154*  BUN 19 25* 16 12  CREATININE 1.21* 1.20* 0.70 0.59  CALCIUM 8.6*  --  9.0 9.1  MG 1.9  --   --   --   GFRNONAA 44*  --  >60 >60  ANIONGAP 10  --  12 8     Lipids No results for input(s): "CHOL", "TRIG", "HDL", "LABVLDL", "LDLCALC", "CHOLHDL" in the last 168 hours.  Hematology Recent Labs  Lab 07/04/22 0846 07/04/22 0858 07/06/22 0243  WBC 10.1  --  7.8  RBC 4.06  --  4.44  HGB 13.6 14.3 14.7  HCT 42.2 42.0 44.7  MCV 103.9*  --  100.7*  MCH 33.5  --  33.1  MCHC 32.2  --  32.9  RDW 14.2  --  13.9  PLT 151  --  130*    Thyroid No results for input(s): "TSH", "FREET4" in the last 168 hours.  BNP Recent Labs  Lab 07/04/22 0846  BNP 201.2*     DDimer No results for input(s): "DDIMER" in the last 168 hours.   Radiology      Cardiac Studies    06/26/22: TTE  1. Left ventricular ejection fraction, by estimation, is 60 to 65%. The  left ventricle has normal function. The left ventricle has no regional  wall motion abnormalities. Left ventricular diastolic function could not  be evaluated.   2. Right ventricular systolic function is normal. The right ventricular  size is normal. There is mildly elevated pulmonary artery systolic  pressure. The estimated right ventricular systolic pressure is 31.5 mmHg.   3. The mitral valve is normal in structure. No evidence of mitral valve  regurgitation. No evidence of mitral stenosis.   4. The aortic valve is tricuspid. Aortic valve regurgitation is not  visualized. Aortic valve sclerosis/calcification is present, without any  evidence of aortic stenosis.   5. There is mild dilatation of the ascending aorta, measuring 39  mm.   6. The inferior vena cava is normal in size with greater than 50%  respiratory variability, suggesting right atrial pressure of 3 mmHg.      06/25/22: stress myoview   The study is normal. The study is low risk.   No ST deviation was noted.  Right bundle branch block noted as baseline with atrial fibrillation.   LV perfusion is normal. There is no evidence of ischemia. There is no evidence of infarction.   Left ventricular function is normal. Nuclear stress EF: 67 %. The left ventricular ejection fraction is hyperdynamic (>65%). End diastolic cavity size is normal. End systolic cavity size is normal.   Patient Profile     86 y.o. female  with HTN, HLD, AFib, DM admitted with profound weakness/near syncope and marked bradycardia  Assessment & Plan    Bradycardia--iatrogenic  Afib persistent  RBBB  HTN    LV function normal and neg myoview   Diabetes    Reviewed Dr Thermon Leyland  note-- agree with him that dofetilide is not an option with QT prolongation, even despite correcting for RBBB.  I also agree with him that amio would be the next option-- it has some bradycardia issues, and some percentage of these people will end up needing back up brady pacing or drug discontinuation but that number < 10%.  We have reviewed potential side effects.  We have also reviewed the paucity of viable short-term options.  Will load with amiodarone and she will follow through with her cardioversion scheduled by Dr. Thermon Leyland.  Amiodarone surveillance laboratories were normal 10/23 and should be repeated in about 6 weeks.  She should also be scheduled for pulmonary function testing for baseline.  In the case of her more rapid rates, if she is relatively asymptomatic, she could be discharged on amio and would arrange for outpt [facility] but that can be done next week given the slow accumulation of amo    We will check a hemoglobin A1c.  I suspect will however with her blood sugar above 300, and her prior use of  metformin, that will be elevated.  Would recommend that we discharge her on low-dose metformin and she can follow-up with her PCP  As relates to her hypertension, her home ARB with olmesartan 40, would continue that and then I would also add low-dose  amlodipine 2.5 mg      For questions or updates, please contact [place] Please consult [email or web address] for contact info under        Signed, Virl Axe, MD  07/06/2022, 8:55 AM

## 2022-07-06 NOTE — H&P (View-Only) (Signed)
Rounding Note    Patient Name: Debra Collins Date of Encounter: 07/06/2022  [facility] Cardiologist: Dr. Sallyanne Kuster  Subjective   Without complaint.  No chest pain or shortness of breath and unaware of palpitations Apprised to find that her blood sugar was 300 hemoglobin A1c 3/23 was 6.4  Inpatient Medications    Scheduled Meds:  atorvastatin  40 mg Oral QPM   atropine  0.5 mg Intravenous Once   buPROPion  150 mg Oral q AM   Chlorhexidine Gluconate Cloth  6 each Topical Daily   insulin aspart  0-15 Units Subcutaneous TID WC   insulin aspart  0-5 Units Subcutaneous QHS   irbesartan  150 mg Oral Daily   pantoprazole  40 mg Oral Daily   Continuous Infusions:  heparin 1,150 Units/hr (07/06/22 0800)   PRN Meds: acetaminophen, hydrALAZINE, ondansetron (ZOFRAN) IV, mouth rinse   Vital Signs    Vitals:   07/06/22 0600 07/06/22 0700 07/06/22 0800 07/06/22 0802  BP: (!) 149/73 (!) 141/130 (!) 135/121   Pulse: 80 97 (!) 105   Resp: 14 19 (!) 24   Temp:    97.9 F (36.6 C)  TempSrc:    Oral  SpO2: 95% 94% 95%   Weight:      Height:        Intake/Output Summary (Last 24 hours) at 07/06/2022 0855 Last data filed at 07/06/2022 0800 Gross per 24 hour  Intake 121.69 ml  Output 1500 ml  Net -1378.31 ml       07/04/2022    3:06 PM 06/25/2022    7:32 AM 06/20/2022   10:19 AM  Last 3 Weights  Weight (lbs) 154 lb 15.7 oz 160 lb 160 lb 9.6 oz  Weight (kg) 70.3 kg 72.576 kg 72.848 kg      Telemetry    Recurrent afib  ECG     Personally Reviewed  from 2021 and 2022 with QTc > 440 msec  Physical Exam   Well developed and nourished in no acute distress HENT normal Neck supple w  Carotids brisk and full without bruits Clear Irregularly irregular rate and rhythm with rapid ventricular response, no murmurs or gallops Abd-soft with active BS without hepatomegaly No Clubbing cyanosis edema Skin-warm and dry A & Oriented  Grossly normal sensory and motor  function   Labs    High Sensitivity Troponin:   Recent Labs  Lab 07/04/22 0846  TROPONINIHS 7      Chemistry Recent Labs  Lab 07/04/22 0846 07/04/22 0858 07/05/22 0454 07/06/22 0243  NA 134* 136 139 140  K 5.4* 5.4* 4.1 4.0  CL 103 101 103 104  CO2 21*  --  24 28  GLUCOSE 338* 334* 163* 154*  BUN 19 25* 16 12  CREATININE 1.21* 1.20* 0.70 0.59  CALCIUM 8.6*  --  9.0 9.1  MG 1.9  --   --   --   GFRNONAA 44*  --  >60 >60  ANIONGAP 10  --  12 8     Lipids No results for input(s): "CHOL", "TRIG", "HDL", "LABVLDL", "LDLCALC", "CHOLHDL" in the last 168 hours.  Hematology Recent Labs  Lab 07/04/22 0846 07/04/22 0858 07/06/22 0243  WBC 10.1  --  7.8  RBC 4.06  --  4.44  HGB 13.6 14.3 14.7  HCT 42.2 42.0 44.7  MCV 103.9*  --  100.7*  MCH 33.5  --  33.1  MCHC 32.2  --  32.9  RDW 14.2  --  13.9  PLT 151  --  130*    Thyroid No results for input(s): "TSH", "FREET4" in the last 168 hours.  BNP Recent Labs  Lab 07/04/22 0846  BNP 201.2*     DDimer No results for input(s): "DDIMER" in the last 168 hours.   Radiology      Cardiac Studies    06/26/22: TTE  1. Left ventricular ejection fraction, by estimation, is 60 to 65%. The  left ventricle has normal function. The left ventricle has no regional  wall motion abnormalities. Left ventricular diastolic function could not  be evaluated.   2. Right ventricular systolic function is normal. The right ventricular  size is normal. There is mildly elevated pulmonary artery systolic  pressure. The estimated right ventricular systolic pressure is 36.4 mmHg.   3. The mitral valve is normal in structure. No evidence of mitral valve  regurgitation. No evidence of mitral stenosis.   4. The aortic valve is tricuspid. Aortic valve regurgitation is not  visualized. Aortic valve sclerosis/calcification is present, without any  evidence of aortic stenosis.   5. There is mild dilatation of the ascending aorta, measuring 39  mm.   6. The inferior vena cava is normal in size with greater than 50%  respiratory variability, suggesting right atrial pressure of 3 mmHg.      06/25/22: stress myoview   The study is normal. The study is low risk.   No ST deviation was noted.  Right bundle branch block noted as baseline with atrial fibrillation.   LV perfusion is normal. There is no evidence of ischemia. There is no evidence of infarction.   Left ventricular function is normal. Nuclear stress EF: 67 %. The left ventricular ejection fraction is hyperdynamic (>65%). End diastolic cavity size is normal. End systolic cavity size is normal.   Patient Profile     86 y.o. female  with HTN, HLD, AFib, DM admitted with profound weakness/near syncope and marked bradycardia  Assessment & Plan    Bradycardia--iatrogenic  Afib persistent  RBBB  HTN    LV function normal and neg myoview   Diabetes    Reviewed Dr Thermon Leyland  note-- agree with him that dofetilide is not an option with QT prolongation, even despite correcting for RBBB.  I also agree with him that amio would be the next option-- it has some bradycardia issues, and some percentage of these people will end up needing back up brady pacing or drug discontinuation but that number < 10%.  We have reviewed potential side effects.  We have also reviewed the paucity of viable short-term options.  Will load with amiodarone and she will follow through with her cardioversion scheduled by Dr. Thermon Leyland.  Amiodarone surveillance laboratories were normal 10/23 and should be repeated in about 6 weeks.  She should also be scheduled for pulmonary function testing for baseline.  In the case of her more rapid rates, if she is relatively asymptomatic, she could be discharged on amio and would arrange for outpt [facility] but that can be done next week given the slow accumulation of amo    We will check a hemoglobin A1c.  I suspect will however with her blood sugar above 300, and her prior use of  metformin, that will be elevated.  Would recommend that we discharge her on low-dose metformin and she can follow-up with her PCP  As relates to her hypertension, her home ARB with olmesartan 40, would continue that and then I would also add low-dose  amlodipine 2.5 mg      For questions or updates, please contact [place] Please consult [email or web address] for contact info under        Signed, Virl Axe, MD  07/06/2022, 8:55 AM

## 2022-07-06 NOTE — Discharge Summary (Signed)
Discharge Summary    Patient ID: Debra Collins MRN: [id number]; DOB: February 08, 1936  Admit date: 07/04/2022 Discharge date: 07/06/2022  PCP:  Unk Pinto, [place] Providers Cardiologist:  None   {  Discharge Diagnoses    Principal Problem:   Symptomatic bradycardia Active Problems:   Essential hypertension   Type 2 diabetes mellitus ([place])   Hyperlipidemia   Persistent atrial fibrillation [facility])    Diagnostic Studies/Procedures    Temporary Pacemaker 07/04/2022: Successful placement of temporary transvenous pacemaker via right Internal jugular vein.  _____________   History of Present Illness     Debra Collins is a 86 y.o. female with a history of paroxysmal atrial fibrillation on Eliquis, hypertension, hyperlipidemia, and type 2 diabetes who was admitted on 07/04/2022 for symptomatic bradycardia.  Patient was recently referred to Dr. Phineas Inches on 05/23/2022 for atrial fibrillation.  She noted some chest discomfort at that time.  Patient was continued on Atenolol, diltiazem, and Eliquis. Echo was ordered and plan was for outpatient DCCV after 3 weeks of uninterrupted anticoagulation.  Patient underwent successful DCCV on 06/13/2022 with restoration of sinus rhythm.  Of note, she did have to be cardioverted 3 times with 120, 150, and then 200 J before restoration of sinus rhythm was achieved.  Patient was seen back by Dr. Sallyanne Kuster on 06/20/2022 and was noted to be back in atrial fibrillation.  Patient reported feeling great after his cardioversion until 2 days prior when she started to feel fatigued and dyspneic again.  She was noted to be in coarse atrial fibrillation with rates in the 60s.  Medical therapy versus radiofrequency ablation was discussed medical therapy was felt to be the best option in view of her age.  Plan was to obtain a Myoview and if no ischemia start Flecainide. Myoview on 06/25/2022 was low risk and showed no evidence of ischemia. Echo on  06/26/2022 showed LVEF of 60-65% with normal wall motion and no significant valvular disease. Given negative Myoview, she was started on Flecainide.  Patient presented to the ED on 07/04/2022 with weakness. She was getting ready for an appointment when she started to feel very weak and she slid to the floor without falling or injury. She was too weak to get up on her own and called her son for help. Upon arrival to the ED, she was noted to be markedly bradycardic with rates in the high 20s. She was mentating well. She was given Atropine with some mild improvement in her heart rate to the 30s. EKD showed junctional rhythm with RBBB and rates in the 30s. Potassium was 5.4 and Magnesium was 1.9 on arrival. TSH was normal. High-sensitivity troponin negative. EP was consulted and she was admitted. Temporary wire was placed while AV nodal agents washed out of her system.  Hospital Course     Consultants: None   Symptomatic Bradycardia Persistent Atrial Fibrillation RBBB Patient was admitted with symptomatic bradycardia as stated above.  Initially noted to be in a junctional rhythm with rates in the 20s.  She received Atropine with mild improvement and rates the 30s.  She was on atenolol, diltiazem, and flecainide at home so all of these were held and a temporary pacemaker was placed while all AV nodal agents washed out.  She regained AV conduction with minimal ventricular pacing.  However, she then went back into atrial fibrillation with heart rates in the 90s to 110s.  Dr. Caryl Comes recommended starting Amiodarone (not felt to be a  candidate for Tikosyn given QTc prolongation). She was started on Amiodarone '200mg'$  twice daily.  She is already scheduled for an outpatient cardioversion on 07/19/2022 so we will keep this as scheduled.  Hopefully, she will maintain sinus rhythm after Amiodarone load.  She will remain off all AV nodal agents.  Continue Eliquis 5 mg twice daily.  Temp wire was pulled on 07/06/2022 and she  was able to ambulate without any issues. Therefore, she is felt to be stable for discharge advised patient to let us know if heart rates are remaining uncontrolled prior to cardioversion. Dr. Caryl Comes recommended outpatient MCOT to help assess rate control - this can be placed next week given the slow accumulation of Amiodarone. Outpatient PFTs were also recommended to have as a baseline since we are starting Amiodarone - this can be arranged at follow-up visit. LFTs and TSH were normal in 04/2022. Recommend repeat LFTs and TSH in about 6 weeks.  Hypertension BP mildly elevated during admission. Continue home Olmesartan '40mg'$  daily. Started on Amlodipine 2.'5mg'$  daily since Atenolol and Diltiazem were stopped.  Hyperlipidemia Continue home Lipitor '40mg'$  daily.  Type 2 Diabetes Mellitus Hemoglobin A1c 6.8% in 04/2022. Blood sugars were running as high as the 300s at times this admission. Patient currently not on any diabetes medications. Therefore, Dr. Caryl Comes recommended starting Metformin '500mg'$  twice daily. She states she has been on this before but did not like the way it made her feel. She denies any significant GI issues on it but states she just had a generalized sense of unwellness on it. It has been a couple of years since she has been on Metformin. She is willing to retry it. Therefore, will prescribe Metformin '500mg'$  twice daily. Advised patient to follow-up with PCP for further management of this.  AKI - Resolved Hyperkalemia - Resolved Creatinine 1.2 on arrival. Baseline around 0.8. Likely due to hypoperfusion in setting of bradycardia. Creatinine improved with improvement in heart rate. She was also slightly hyperkalemia on arrival with potassium of 5.4. improved without any specific intervention and she was able to be restarted on ARB. Creatinine 0.59 and potassium 4.0 on day of discharge.   Patient seen and examined by Dr. Caryl Comes and felt to be stable for discharge. Outpatient follow-up arranged.  Medications as below.     Did the patient have an acute coronary syndrome (MI, NSTEMI, STEMI, etc) this admission?:  No                               Did the patient have a percutaneous coronary intervention (stent / angioplasty)?:  No.     _____________  Discharge Vitals Blood pressure (!) 150/87, pulse 98, temperature 97.8 F (36.6 C), temperature source Oral, resp. rate 16, height '5\' 3"'$  (1.6 m), weight 70.3 kg, SpO2 93 %.  Filed Weights   07/04/22 1506  Weight: 70.3 kg    Labs & Radiologic Studies    CBC Recent Labs    07/04/22 0846 07/04/22 0858 07/06/22 0243  WBC 10.1  --  7.8  NEUTROABS 7.3  --   --   HGB 13.6 14.3 14.7  HCT 42.2 42.0 44.7  MCV 103.9*  --  100.7*  PLT 151  --  341*   Basic Metabolic Panel Recent Labs    07/04/22 0846 07/04/22 0858 07/05/22 0454 07/06/22 0243  NA 134*   < > 139 140  K 5.4*   < > 4.1 4.0  CL 103   < > 103 104  CO2 21*  --  24 28  GLUCOSE 338*   < > 163* 154*  BUN 19   < > 16 12  CREATININE 1.21*   < > 0.70 0.59  CALCIUM 8.6*  --  9.0 9.1  MG 1.9  --   --   --    < > = values in this interval not displayed.   Liver Function Tests No results for input(s): "AST", "ALT", "ALKPHOS", "BILITOT", "PROT", "ALBUMIN" in the last 72 hours. No results for input(s): "LIPASE", "AMYLASE" in the last 72 hours. High Sensitivity Troponin:   Recent Labs  Lab 07/04/22 0846  TROPONINIHS 7    BNP Invalid input(s): "POCBNP" D-Dimer No results for input(s): "DDIMER" in the last 72 hours. Hemoglobin A1C No results for input(s): "HGBA1C" in the last 72 hours. Fasting Lipid Panel No results for input(s): "CHOL", "HDL", "LDLCALC", "TRIG", "CHOLHDL", "LDLDIRECT" in the last 72 hours. Thyroid Function Tests No results for input(s): "TSH", "T4TOTAL", "T3FREE", "THYROIDAB" in the last 72 hours.  Invalid input(s): "FREET3" _____________  DG Chest Port 1V same Day  Result Date: 07/04/2022 CLINICAL DATA:  Line placement EXAM: PORTABLE  CHEST 1 VIEW COMPARISON:  Chest x-ray 07/04/2022, 09/04/2016 FINDINGS: Hardware in the cervical spine. Cardiomegaly with vascular congestion and probable mild interstitial edema. Areas of subsegmental atelectasis. No pleural effusion. Aortic atherosclerosis. Right IJ pacing lead with tip over the right ventricular area. No pneumothorax. IMPRESSION: 1. Right IJ pacing lead with tip over the right ventricular area. No pneumothorax. 2. Cardiomegaly with vascular congestion and probable mild interstitial edema. Electronically Signed   By: Donavan Foil M.D.   On: 07/04/2022 18:02   CARDIAC CATHETERIZATION  Result Date: 07/04/2022 Successful placement of temporary transvenous pacemaker via right Internal jugular vein.   DG Chest Port 1 View  Result Date: 07/04/2022 CLINICAL DATA:  Weakness EXAM: PORTABLE CHEST 1 VIEW COMPARISON:  Portable exam 0950 hours compared to 05/12/2017 FINDINGS: External pacing leads project over chest. Enlargement of cardiac silhouette. Atherosclerotic calcification aorta. Pulmonary vascularity normal. Minimal bibasilar atelectasis. Remaining lungs clear. No pleural effusion, pneumothorax, or acute osseous findings. IMPRESSION: Minimal bibasilar atelectasis. Enlargement of cardiac silhouette. Aortic Atherosclerosis (ICD10-I70.0). Electronically Signed   By: Lavonia Dana M.D.   On: 07/04/2022 10:07   CT Head Wo Contrast  Result Date: 07/04/2022 CLINICAL DATA:  Provided history: Head trauma, minor. Facial trauma, blunt. Neck trauma. EXAM: CT HEAD WITHOUT CONTRAST CT MAXILLOFACIAL WITHOUT CONTRAST CT CERVICAL SPINE WITHOUT CONTRAST TECHNIQUE: Multidetector CT imaging of the head, cervical spine, and maxillofacial structures were performed using the standard protocol without intravenous contrast. Multiplanar CT image reconstructions of the cervical spine and maxillofacial structures were also generated. RADIATION DOSE REDUCTION: This exam was performed according to the departmental  dose-optimization program which includes automated exposure control, adjustment of the mA and/or kV according to patient size and/or use of iterative reconstruction technique. COMPARISON:  Brain MRI 10/19/2016. Cervical spine CT 11/11/2016. FINDINGS: CT HEAD FINDINGS Brain: Mild generalized cerebral atrophy. Mild patchy and ill-defined hypoattenuation within the cerebral white matter, nonspecific but compatible with chronic small vessel ischemic disease. There is no acute intracranial hemorrhage. No demarcated cortical infarct. No extra-axial fluid collection. No evidence of an intracranial mass. No midline shift. Vascular: No hyperdense vessel. Atherosclerotic calcifications. Skull: No fracture or aggressive osseous lesion. CT MAXILLOFACIAL FINDINGS Osseous: No evidence of acute maxillofacial fracture. Orbits: No acute orbital finding. Sinuses: Mild mucosal thickening within the bilateral maxillary sinuses.  Soft tissues: No maxillofacial hematoma is appreciable by CT. CT CERVICAL SPINE FINDINGS Alignment: Straightening of the expected cervical lordosis. 1-2 mm grade 1 anterolisthesis at C2-C3 and C3-C4. Skull base and vertebrae: The basion-dental and atlanto-dental intervals are maintained.No evidence of acute fracture to the cervical spine. 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 Soft tissues and spinal canal: No prevertebral fluid or swelling. No visible canal hematoma. Disc levels: Prior C5-C7 ACDF. Solid arthrodesis at C5-C6 and C6-C7. No evidence of acute hardware compromise. Cervical spondylosis with multilevel disc space narrowing, disc bulges/central disc protrusions, uncovertebral hypertrophy and facet arthrosis. Disc space narrowing is greatest at T1-T2 (advanced at this level). No appreciable high-grade spinal canal stenosis. Multilevel bony neural foraminal narrowing. Degenerative changes are also present at the C1-C2 articulation. Upper chest: No consolidation within the  imaged lung apices. No visible pneumothorax. Other: Atherosclerotic plaque within the visualized aortic arch, proximal major branch vessels of the neck and carotid arteries. IMPRESSION: CT head: 1. No evidence of acute intracranial abnormality. 2. Mild chronic small vessel ischemic changes within the cerebral white matter. 3. Mild generalized cerebral atrophy. CT maxillofacial: No evidence of acute maxillofacial fracture. CT cervical spine: 1. No evidence of acute fracture to the cervical spin 2. 1-2 mm grade 1 anterolisthesis at C2-C3 and C3-C4. 3. Prior C5-C7 ACDF. No evidence of acute hardware compromise. 4. Cervical spondylosis, as described. 5. 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 Electronically Signed   By: Kellie Simmering D.O.   On: 07/04/2022 09:46   CT Cervical Spine Wo Contrast  Result Date: 07/04/2022 CLINICAL DATA:  Provided history: Head trauma, minor. Facial trauma, blunt. Neck trauma. EXAM: CT HEAD WITHOUT CONTRAST CT MAXILLOFACIAL WITHOUT CONTRAST CT CERVICAL SPINE WITHOUT CONTRAST TECHNIQUE: Multidetector CT imaging of the head, cervical spine, and maxillofacial structures were performed using the standard protocol without intravenous contrast. Multiplanar CT image reconstructions of the cervical spine and maxillofacial structures were also generated. RADIATION DOSE REDUCTION: This exam was performed according to the departmental dose-optimization program which includes automated exposure control, adjustment of the mA and/or kV according to patient size and/or use of iterative reconstruction technique. COMPARISON:  Brain MRI 10/19/2016. Cervical spine CT 11/11/2016. FINDINGS: CT HEAD FINDINGS Brain: Mild generalized cerebral atrophy. Mild patchy and ill-defined hypoattenuation within the cerebral white matter, nonspecific but compatible with chronic small vessel ischemic disease. There is no acute intracranial hemorrhage. No demarcated cortical infarct. No  extra-axial fluid collection. No evidence of an intracranial mass. No midline shift. Vascular: No hyperdense vessel. Atherosclerotic calcifications. Skull: No fracture or aggressive osseous lesion. CT MAXILLOFACIAL FINDINGS Osseous: No evidence of acute maxillofacial fracture. Orbits: No acute orbital finding. Sinuses: Mild mucosal thickening within the bilateral maxillary sinuses. Soft tissues: No maxillofacial hematoma is appreciable by CT. CT CERVICAL SPINE FINDINGS Alignment: Straightening of the expected cervical lordosis. 1-2 mm grade 1 anterolisthesis at C2-C3 and C3-C4. Skull base and vertebrae: The basion-dental and atlanto-dental intervals are maintained.No evidence of acute fracture to the cervical spine. 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 Soft tissues and spinal canal: No prevertebral fluid or swelling. No visible canal hematoma. Disc levels: Prior C5-C7 ACDF. Solid arthrodesis at C5-C6 and C6-C7. No evidence of acute hardware compromise. Cervical spondylosis with multilevel disc space narrowing, disc bulges/central disc protrusions, uncovertebral hypertrophy and facet arthrosis. Disc space narrowing is greatest at T1-T2 (advanced at this level). No appreciable high-grade spinal canal stenosis. Multilevel bony  neural foraminal narrowing. Degenerative changes are also present at the C1-C2 articulation. Upper chest: No consolidation within the imaged lung apices. No visible pneumothorax. Other: Atherosclerotic plaque within the visualized aortic arch, proximal major branch vessels of the neck and carotid arteries. IMPRESSION: CT head: 1. No evidence of acute intracranial abnormality. 2. Mild chronic small vessel ischemic changes within the cerebral white matter. 3. Mild generalized cerebral atrophy. CT maxillofacial: No evidence of acute maxillofacial fracture. CT cervical spine: 1. No evidence of acute fracture to the cervical spin 2. 1-2 mm grade 1 anterolisthesis at  C2-C3 and C3-C4. 3. Prior C5-C7 ACDF. No evidence of acute hardware compromise. 4. Cervical spondylosis, as described. 5. 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 Electronically Signed   By: Kellie Simmering D.O.   On: 07/04/2022 09:46   CT Maxillofacial Wo Contrast  Result Date: 07/04/2022 CLINICAL DATA:  Provided history: Head trauma, minor. Facial trauma, blunt. Neck trauma. EXAM: CT HEAD WITHOUT CONTRAST CT MAXILLOFACIAL WITHOUT CONTRAST CT CERVICAL SPINE WITHOUT CONTRAST TECHNIQUE: Multidetector CT imaging of the head, cervical spine, and maxillofacial structures were performed using the standard protocol without intravenous contrast. Multiplanar CT image reconstructions of the cervical spine and maxillofacial structures were also generated. RADIATION DOSE REDUCTION: This exam was performed according to the departmental dose-optimization program which includes automated exposure control, adjustment of the mA and/or kV according to patient size and/or use of iterative reconstruction technique. COMPARISON:  Brain MRI 10/19/2016. Cervical spine CT 11/11/2016. FINDINGS: CT HEAD FINDINGS Brain: Mild generalized cerebral atrophy. Mild patchy and ill-defined hypoattenuation within the cerebral white matter, nonspecific but compatible with chronic small vessel ischemic disease. There is no acute intracranial hemorrhage. No demarcated cortical infarct. No extra-axial fluid collection. No evidence of an intracranial mass. No midline shift. Vascular: No hyperdense vessel. Atherosclerotic calcifications. Skull: No fracture or aggressive osseous lesion. CT MAXILLOFACIAL FINDINGS Osseous: No evidence of acute maxillofacial fracture. Orbits: No acute orbital finding. Sinuses: Mild mucosal thickening within the bilateral maxillary sinuses. Soft tissues: No maxillofacial hematoma is appreciable by CT. CT CERVICAL SPINE FINDINGS Alignment: Straightening of the expected cervical lordosis. 1-2 mm  grade 1 anterolisthesis at C2-C3 and C3-C4. Skull base and vertebrae: The basion-dental and atlanto-dental intervals are maintained.No evidence of acute fracture to the cervical spine. 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 Soft tissues and spinal canal: No prevertebral fluid or swelling. No visible canal hematoma. Disc levels: Prior C5-C7 ACDF. Solid arthrodesis at C5-C6 and C6-C7. No evidence of acute hardware compromise. Cervical spondylosis with multilevel disc space narrowing, disc bulges/central disc protrusions, uncovertebral hypertrophy and facet arthrosis. Disc space narrowing is greatest at T1-T2 (advanced at this level). No appreciable high-grade spinal canal stenosis. Multilevel bony neural foraminal narrowing. Degenerative changes are also present at the C1-C2 articulation. Upper chest: No consolidation within the imaged lung apices. No visible pneumothorax. Other: Atherosclerotic plaque within the visualized aortic arch, proximal major branch vessels of the neck and carotid arteries. IMPRESSION: CT head: 1. No evidence of acute intracranial abnormality. 2. Mild chronic small vessel ischemic changes within the cerebral white matter. 3. Mild generalized cerebral atrophy. CT maxillofacial: No evidence of acute maxillofacial fracture. CT cervical spine: 1. No evidence of acute fracture to the cervical spin 2. 1-2 mm grade 1 anterolisthesis at C2-C3 and C3-C4. 3. Prior C5-C7 ACDF. No evidence of acute hardware compromise. 4. Cervical spondylosis, as described. 5. Facet joint ankylosis on the right at C3-C4, and bilaterally at  C4-C5. Electronically Signed   By: Kellie Simmering D.O.   On: 07/04/2022 09:46   ECHOCARDIOGRAM COMPLETE  Result Date: 06/26/2022    ECHOCARDIOGRAM REPORT   Patient Name:   Debra Collins Date of Exam: 06/26/2022 Medical Rec #:  [id number]      Height:       62.5 in Accession #:    [id number]     Weight:       160.0 lb Date of Birth:  1935/10/08       BSA:          1.749 m Patient Age:    21 years       BP:           134/70 mmHg Patient Gender: F              HR:           59 bpm. Exam Location:  [address] Procedure: 2D Echo, Cardiac Doppler and Color Doppler Indications:    I48.91* Unspecified atrial fibrillation  History:        Patient has no prior history of Echocardiogram examinations.                 Stroke, Arrythmias:LBBB, Signs/Symptoms:Shortness of Breath and                 Fatigue; Risk Factors:Hypertension, Diabetes, Dyslipidemia and                 Former Smoker. Aortic atherosclerosis. S/P cardioversion.                 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  Sonographer:    Diamond Nickel RCS Referring Phys: Janina Mayo  Sonographer Comments: Image acquisition challenging due to mastectomy. IMPRESSIONS  1. Left ventricular ejection fraction, by estimation, is 60 to 65%. The left ventricle has normal function. The left ventricle has no regional wall motion abnormalities. Left ventricular diastolic function could not be evaluated.  2. Right ventricular systolic function is normal. The right ventricular size is normal. There is mildly elevated pulmonary artery systolic pressure. The estimated right ventricular systolic pressure is 42.5 mmHg.  3. The mitral valve is normal in structure. No evidence of mitral valve regurgitation. No evidence of mitral stenosis.  4. The aortic valve is tricuspid. Aortic valve regurgitation is not visualized. Aortic valve sclerosis/calcification is present, without any evidence of aortic stenosis.  5. There is mild dilatation of the ascending aorta, measuring 39 mm.  6. The inferior vena cava is normal in size with greater than 50% respiratory variability, suggesting right atrial pressure of 3 mmHg. FINDINGS  Left Ventricle: Left ventricular ejection fraction, by estimation, is 60 to 65%. The left ventricle has normal function. The left ventricle has no regional wall motion abnormalities. The left  ventricular internal cavity size was normal in size. There is  no left ventricular hypertrophy. Left ventricular diastolic function could not be evaluated due to atrial fibrillation. Left ventricular diastolic function could not be evaluated. Right Ventricle: The right ventricular size is normal. No increase in right ventricular wall thickness. Right ventricular systolic function is normal. There is mildly elevated pulmonary artery systolic pressure. The tricuspid regurgitant velocity is 3.15  m/s, and with an assumed right atrial pressure of 3 mmHg, the estimated right ventricular systolic pressure is 95.6 mmHg. Left Atrium: Left atrial size was normal in size. Right Atrium: Right atrial size was normal in size. Pericardium: There is no evidence of  pericardial effusion. Mitral Valve: The mitral valve is normal in structure. Mild to moderate mitral annular calcification. No evidence of mitral valve regurgitation. No evidence of mitral valve stenosis. Tricuspid Valve: The tricuspid valve is normal in structure. Tricuspid valve regurgitation is trivial. No evidence of tricuspid stenosis. Aortic Valve: The aortic valve is tricuspid. Aortic valve regurgitation is not visualized. Aortic valve sclerosis/calcification is present, without any evidence of aortic stenosis. Pulmonic Valve: The pulmonic valve was normal in structure. Pulmonic valve regurgitation is trivial. No evidence of pulmonic stenosis. Aorta: The aortic root is normal in size and structure. There is mild dilatation of the ascending aorta, measuring 39 mm. Venous: The inferior vena cava is normal in size with greater than 50% respiratory variability, suggesting right atrial pressure of 3 mmHg. IAS/Shunts: No atrial level shunt detected by color flow Doppler.  LEFT VENTRICLE PLAX 2D LVIDd:         3.20 cm LVIDs:         1.60 cm LV PW:         1.40 cm LV IVS:        1.00 cm LVOT diam:     1.80 cm LV SV:         48 LV SV Index:   27 LVOT Area:     2.54 cm   RIGHT VENTRICLE RV Basal diam:  3.30 cm RV S prime:     10.50 cm/s TAPSE (M-mode): 1.8 cm RVSP:           42.7 mmHg LEFT ATRIUM             Index        RIGHT ATRIUM           Index LA diam:        3.95 cm 2.26 cm/m   RA Pressure: 3.00 mmHg LA Vol (A2C):   47.1 ml 26.93 ml/m  RA Area:     18.00 cm LA Vol (A4C):   64.6 ml 36.93 ml/m  RA Volume:   46.50 ml  26.58 ml/m LA Biplane Vol: 57.8 ml 33.05 ml/m  AORTIC VALVE LVOT Vmax:   94.07 cm/s LVOT Vmean:  63.867 cm/s LVOT VTI:    0.187 m  AORTA Ao Root diam: 2.80 cm Ao Asc diam:  3.90 cm TRICUSPID VALVE TR Peak grad:   39.7 mmHg TR Vmax:        315.00 cm/s Estimated RAP:  3.00 mmHg RVSP:           42.7 mmHg  SHUNTS Systemic VTI:  0.19 m Systemic Diam: 1.80 cm Fransico Him MD Electronically signed by Fransico Him MD Signature Date/Time: 06/26/2022/11:30:05 AM    Final    MYOCARDIAL PERFUSION IMAGING  Result Date: 06/25/2022   The study is normal. The study is low risk.   No ST deviation was noted.  Right bundle branch block noted as baseline with atrial fibrillation.   LV perfusion is normal. There is no evidence of ischemia. There is no evidence of infarction.   Left ventricular function is normal. Nuclear stress EF: 67 %. The left ventricular ejection fraction is hyperdynamic (>65%). End diastolic cavity size is normal. End systolic cavity size is normal.   Disposition   Pt is being discharged home today in good condition.  Follow-up Plans & Appointments     Follow-up Information     Croitoru, Mihai, MD Follow up.   Specialty: Cardiology Why: Follow-up with Dr. Sallyanne Kuster schedule for 08/09/2022 at 2:40pm. Please arrive  15 minutes early for check-in. If this date/time does not work for you, please call our office to reschedule. Contact information: [address] [place] [address] [phone]                   Discharge Medications   Allergies as of 07/06/2022   No Known Allergies      Medication List      STOP taking these medications    aspirin EC 81 MG tablet   atenolol 100 MG tablet Commonly known as: TENORMIN   diltiazem 240 MG 24 hr capsule Commonly known as: Cartia XT   flecainide 50 MG tablet Commonly known as: TAMBOCOR   meloxicam 15 MG tablet Commonly known as: MOBIC       TAKE these medications    amiodarone 200 MG tablet Commonly known as: PACERONE Take 1 tablet (200 mg total) by mouth 2 (two) times daily.   amLODipine 2.5 MG tablet Commonly known as: NORVASC Take 1 tablet (2.5 mg total) by mouth daily.   apixaban 5 MG Tabs tablet Commonly known as: ELIQUIS Take 1 tablet (5 mg total) by mouth 2 (two) times daily.   atorvastatin 40 MG tablet Commonly known as: LIPITOR Take 1 tablet daily as directed for cholesterol. What changed:  how much to take how to take this when to take this additional instructions   buPROPion 150 MG 24 hr tablet Commonly known as: WELLBUTRIN XL Take 150 mg by mouth in the morning.   cyanocobalamin 1000 MCG tablet Commonly known as: VITAMIN B12 Take 1,000 mcg by mouth daily.   Lumify 0.025 % Soln Generic drug: Brimonidine Tartrate Place 1 drop into both eyes 2 (two) times daily as needed (eye redness/irritation.).   metformin 500 MG (OSM) 24 hr tablet Commonly known as: FORTAMET Take 1 tablet (500 mg total) by mouth 2 (two) times daily with a meal.   multivitamin with minerals Tabs tablet Take 1 tablet by mouth in the morning.   olmesartan 40 MG tablet Commonly known as: BENICAR Take  1 tablet  Daily for BP                                                                     /                                   TAKE                                  BY                               MOUTH What changed:  how much to take how to take this when to take this additional instructions   omeprazole 20 MG capsule Commonly known as: PRILOSEC Take  1 capsule  Daily  to Prevent Heartburn & Indigestion                                     /  TAKE                          BY                       MOUTH What changed:  how much to take how to take this when to take this additional instructions   VITAMIN D-3 PO Take 1 tablet by mouth in the morning.           Outstanding Labs/Studies   Outpatient PFTs. Repeat LFTs and TSH in 6 weeks.  Duration of Discharge Encounter   Greater than 30 minutes including physician time.  Signed, Darreld Mclean, PA-C 07/06/2022, 2:24 PM

## 2022-07-06 NOTE — Progress Notes (Signed)
Cardiology Progress Note  Patient ID: Debra Collins MRN: [id number] DOB: 1936-06-27 Date of Encounter: 07/06/2022  Primary Cardiologist: None  Subjective   Chief Complaint: None.   HPI: Back in atrial fibrillation with heart rate 90 to 110 bpm.  Not requiring temporary pacing support.  ROS:  All other ROS reviewed and negative. Pertinent positives noted in the HPI.     Inpatient Medications  Scheduled Meds:  atorvastatin  40 mg Oral QPM   atropine  0.5 mg Intravenous Once   buPROPion  150 mg Oral q AM   Chlorhexidine Gluconate Cloth  6 each Topical Daily   pantoprazole  40 mg Oral Daily   Continuous Infusions:  heparin 1,150 Units/hr (07/06/22 0600)   PRN Meds: acetaminophen, hydrALAZINE, ondansetron (ZOFRAN) IV, mouth rinse   Vital Signs   Vitals:   07/06/22 0300 07/06/22 0400 07/06/22 0500 07/06/22 0600  BP:  127/87 (!) 134/54 (!) 149/73  Pulse: 87 81 82 80  Resp: '13 19 14 14  '$ Temp:  98.1 F (36.7 C)    TempSrc:  Oral    SpO2: 96% 94% 96% 95%  Weight:      Height:        Intake/Output Summary (Last 24 hours) at 07/06/2022 0749 Last data filed at 07/06/2022 0600 Gross per 24 hour  Intake 338.63 ml  Output 1500 ml  Net -1161.37 ml      07/04/2022    3:06 PM 06/25/2022    7:32 AM 06/20/2022   10:19 AM  Last 3 Weights  Weight (lbs) 154 lb 15.7 oz 160 lb 160 lb 9.6 oz  Weight (kg) 70.3 kg 72.576 kg 72.848 kg      Telemetry  Overnight telemetry shows A-fib 90 to 110 bpm, which I personally reviewed.   Physical Exam   Vitals:   07/06/22 0300 07/06/22 0400 07/06/22 0500 07/06/22 0600  BP:  127/87 (!) 134/54 (!) 149/73  Pulse: 87 81 82 80  Resp: '13 19 14 14  '$ Temp:  98.1 F (36.7 C)    TempSrc:  Oral    SpO2: 96% 94% 96% 95%  Weight:      Height:        Intake/Output Summary (Last 24 hours) at 07/06/2022 0749 Last data filed at 07/06/2022 0600 Gross per 24 hour  Intake 338.63 ml  Output 1500 ml  Net -1161.37 ml       07/04/2022    3:06  PM 06/25/2022    7:32 AM 06/20/2022   10:19 AM  Last 3 Weights  Weight (lbs) 154 lb 15.7 oz 160 lb 160 lb 9.6 oz  Weight (kg) 70.3 kg 72.576 kg 72.848 kg    Body mass index is 27.45 kg/m.  General: Well nourished, well developed, in no acute distress Head: Atraumatic, normal size  Eyes: PEERLA, EOMI  Neck: Supple, no JVD Endocrine: No thryomegaly Cardiac: Normal S1, S2; irregular rhythm, no murmurs Lungs: Clear to auscultation bilaterally, no wheezing, rhonchi or rales  Abd: Soft, nontender, no hepatomegaly  Ext: No edema, pulses 2+ Musculoskeletal: No deformities, BUE and BLE strength normal and equal Skin: Warm and dry, no rashes   Neuro: Alert and oriented to person, place, time, and situation, CNII-XII grossly intact, no focal deficits  Psych: Normal mood and affect   Labs  High Sensitivity Troponin:   Recent Labs  Lab 07/04/22 0846  TROPONINIHS 7     Cardiac EnzymesNo results for input(s): "TROPONINI" in the last 168 hours. No results for input(s): "  [place]" in the last 168 hours.  Chemistry Recent Labs  Lab 07/04/22 0846 07/04/22 0858 07/05/22 0454 07/06/22 0243  NA 134* 136 139 140  K 5.4* 5.4* 4.1 4.0  CL 103 101 103 104  CO2 21*  --  24 28  GLUCOSE 338* 334* 163* 154*  BUN 19 25* 16 12  CREATININE 1.21* 1.20* 0.70 0.59  CALCIUM 8.6*  --  9.0 9.1  GFRNONAA 44*  --  >60 >60  ANIONGAP 10  --  12 8    Hematology Recent Labs  Lab 07/04/22 0846 07/04/22 0858 07/06/22 0243  WBC 10.1  --  7.8  RBC 4.06  --  4.44  HGB 13.6 14.3 14.7  HCT 42.2 42.0 44.7  MCV 103.9*  --  100.7*  MCH 33.5  --  33.1  MCHC 32.2  --  32.9  RDW 14.2  --  13.9  PLT 151  --  130*   BNP Recent Labs  Lab 07/04/22 0846  BNP 201.2*    DDimer No results for input(s): "DDIMER" in the last 168 hours.   Radiology  DG Chest Port 1V same Day  Result Date: 07/04/2022 CLINICAL DATA:  Line placement EXAM: PORTABLE CHEST 1 VIEW COMPARISON:  Chest x-ray 07/04/2022, 09/04/2016  FINDINGS: Hardware in the cervical spine. Cardiomegaly with vascular congestion and probable mild interstitial edema. Areas of subsegmental atelectasis. No pleural effusion. Aortic atherosclerosis. Right IJ pacing lead with tip over the right ventricular area. No pneumothorax. IMPRESSION: 1. Right IJ pacing lead with tip over the right ventricular area. No pneumothorax. 2. Cardiomegaly with vascular congestion and probable mild interstitial edema. Electronically Signed   By: Donavan Foil M.D.   On: 07/04/2022 18:02   CARDIAC CATHETERIZATION  Result Date: 07/04/2022 Successful placement of temporary transvenous pacemaker via right Internal jugular vein.   DG Chest Port 1 View  Result Date: 07/04/2022 CLINICAL DATA:  Weakness EXAM: PORTABLE CHEST 1 VIEW COMPARISON:  Portable exam 0950 hours compared to 05/12/2017 FINDINGS: External pacing leads project over chest. Enlargement of cardiac silhouette. Atherosclerotic calcification aorta. Pulmonary vascularity normal. Minimal bibasilar atelectasis. Remaining lungs clear. No pleural effusion, pneumothorax, or acute osseous findings. IMPRESSION: Minimal bibasilar atelectasis. Enlargement of cardiac silhouette. Aortic Atherosclerosis (ICD10-I70.0). Electronically Signed   By: Lavonia Dana M.D.   On: 07/04/2022 10:07   CT Head Wo Contrast  Result Date: 07/04/2022 CLINICAL DATA:  Provided history: Head trauma, minor. Facial trauma, blunt. Neck trauma. EXAM: CT HEAD WITHOUT CONTRAST CT MAXILLOFACIAL WITHOUT CONTRAST CT CERVICAL SPINE WITHOUT CONTRAST TECHNIQUE: Multidetector CT imaging of the head, cervical spine, and maxillofacial structures were performed using the standard protocol without intravenous contrast. Multiplanar CT image reconstructions of the cervical spine and maxillofacial structures were also generated. RADIATION DOSE REDUCTION: This exam was performed according to the departmental dose-optimization program which includes automated exposure  control, adjustment of the mA and/or kV according to patient size and/or use of iterative reconstruction technique. COMPARISON:  Brain MRI 10/19/2016. Cervical spine CT 11/11/2016. FINDINGS: CT HEAD FINDINGS Brain: Mild generalized cerebral atrophy. Mild patchy and ill-defined hypoattenuation within the cerebral white matter, nonspecific but compatible with chronic small vessel ischemic disease. There is no acute intracranial hemorrhage. No demarcated cortical infarct. No extra-axial fluid collection. No evidence of an intracranial mass. No midline shift. Vascular: No hyperdense vessel. Atherosclerotic calcifications. Skull: No fracture or aggressive osseous lesion. CT MAXILLOFACIAL FINDINGS Osseous: No evidence of acute maxillofacial fracture. Orbits: No acute orbital finding. Sinuses: Mild mucosal thickening within the bilateral  maxillary sinuses. Soft tissues: No maxillofacial hematoma is appreciable by CT. CT CERVICAL SPINE FINDINGS Alignment: Straightening of the expected cervical lordosis. 1-2 mm grade 1 anterolisthesis at C2-C3 and C3-C4. Skull base and vertebrae: The basion-dental and atlanto-dental intervals are maintained.No evidence of acute fracture to the cervical spine. 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 Soft tissues and spinal canal: No prevertebral fluid or swelling. No visible canal hematoma. Disc levels: Prior C5-C7 ACDF. Solid arthrodesis at C5-C6 and C6-C7. No evidence of acute hardware compromise. Cervical spondylosis with multilevel disc space narrowing, disc bulges/central disc protrusions, uncovertebral hypertrophy and facet arthrosis. Disc space narrowing is greatest at T1-T2 (advanced at this level). No appreciable high-grade spinal canal stenosis. Multilevel bony neural foraminal narrowing. Degenerative changes are also present at the C1-C2 articulation. Upper chest: No consolidation within the imaged lung apices. No visible pneumothorax. Other:  Atherosclerotic plaque within the visualized aortic arch, proximal major branch vessels of the neck and carotid arteries. IMPRESSION: CT head: 1. No evidence of acute intracranial abnormality. 2. Mild chronic small vessel ischemic changes within the cerebral white matter. 3. Mild generalized cerebral atrophy. CT maxillofacial: No evidence of acute maxillofacial fracture. CT cervical spine: 1. No evidence of acute fracture to the cervical spin 2. 1-2 mm grade 1 anterolisthesis at C2-C3 and C3-C4. 3. Prior C5-C7 ACDF. No evidence of acute hardware compromise. 4. Cervical spondylosis, as described. 5. 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 Electronically Signed   By: Kellie Simmering D.O.   On: 07/04/2022 09:46   CT Cervical Spine Wo Contrast  Result Date: 07/04/2022 CLINICAL DATA:  Provided history: Head trauma, minor. Facial trauma, blunt. Neck trauma. EXAM: CT HEAD WITHOUT CONTRAST CT MAXILLOFACIAL WITHOUT CONTRAST CT CERVICAL SPINE WITHOUT CONTRAST TECHNIQUE: Multidetector CT imaging of the head, cervical spine, and maxillofacial structures were performed using the standard protocol without intravenous contrast. Multiplanar CT image reconstructions of the cervical spine and maxillofacial structures were also generated. RADIATION DOSE REDUCTION: This exam was performed according to the departmental dose-optimization program which includes automated exposure control, adjustment of the mA and/or kV according to patient size and/or use of iterative reconstruction technique. COMPARISON:  Brain MRI 10/19/2016. Cervical spine CT 11/11/2016. FINDINGS: CT HEAD FINDINGS Brain: Mild generalized cerebral atrophy. Mild patchy and ill-defined hypoattenuation within the cerebral white matter, nonspecific but compatible with chronic small vessel ischemic disease. There is no acute intracranial hemorrhage. No demarcated cortical infarct. No extra-axial fluid collection. No evidence of an  intracranial mass. No midline shift. Vascular: No hyperdense vessel. Atherosclerotic calcifications. Skull: No fracture or aggressive osseous lesion. CT MAXILLOFACIAL FINDINGS Osseous: No evidence of acute maxillofacial fracture. Orbits: No acute orbital finding. Sinuses: Mild mucosal thickening within the bilateral maxillary sinuses. Soft tissues: No maxillofacial hematoma is appreciable by CT. CT CERVICAL SPINE FINDINGS Alignment: Straightening of the expected cervical lordosis. 1-2 mm grade 1 anterolisthesis at C2-C3 and C3-C4. Skull base and vertebrae: The basion-dental and atlanto-dental intervals are maintained.No evidence of acute fracture to the cervical spine. 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 Soft tissues and spinal canal: No prevertebral fluid or swelling. No visible canal hematoma. Disc levels: Prior C5-C7 ACDF. Solid arthrodesis at C5-C6 and C6-C7. No evidence of acute hardware compromise. Cervical spondylosis with multilevel disc space narrowing, disc bulges/central disc protrusions, uncovertebral hypertrophy and facet arthrosis. Disc space narrowing is greatest at T1-T2 (advanced at this level). No appreciable high-grade spinal canal stenosis.  Multilevel bony neural foraminal narrowing. Degenerative changes are also present at the C1-C2 articulation. Upper chest: No consolidation within the imaged lung apices. No visible pneumothorax. Other: Atherosclerotic plaque within the visualized aortic arch, proximal major branch vessels of the neck and carotid arteries. IMPRESSION: CT head: 1. No evidence of acute intracranial abnormality. 2. Mild chronic small vessel ischemic changes within the cerebral white matter. 3. Mild generalized cerebral atrophy. CT maxillofacial: No evidence of acute maxillofacial fracture. CT cervical spine: 1. No evidence of acute fracture to the cervical spin 2. 1-2 mm grade 1 anterolisthesis at C2-C3 and C3-C4. 3. Prior C5-C7 ACDF. No evidence  of acute hardware compromise. 4. Cervical spondylosis, as described. 5. 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 Electronically Signed   By: Kellie Simmering D.O.   On: 07/04/2022 09:46   CT Maxillofacial Wo Contrast  Result Date: 07/04/2022 CLINICAL DATA:  Provided history: Head trauma, minor. Facial trauma, blunt. Neck trauma. EXAM: CT HEAD WITHOUT CONTRAST CT MAXILLOFACIAL WITHOUT CONTRAST CT CERVICAL SPINE WITHOUT CONTRAST TECHNIQUE: Multidetector CT imaging of the head, cervical spine, and maxillofacial structures were performed using the standard protocol without intravenous contrast. Multiplanar CT image reconstructions of the cervical spine and maxillofacial structures were also generated. RADIATION DOSE REDUCTION: This exam was performed according to the departmental dose-optimization program which includes automated exposure control, adjustment of the mA and/or kV according to patient size and/or use of iterative reconstruction technique. COMPARISON:  Brain MRI 10/19/2016. Cervical spine CT 11/11/2016. FINDINGS: CT HEAD FINDINGS Brain: Mild generalized cerebral atrophy. Mild patchy and ill-defined hypoattenuation within the cerebral white matter, nonspecific but compatible with chronic small vessel ischemic disease. There is no acute intracranial hemorrhage. No demarcated cortical infarct. No extra-axial fluid collection. No evidence of an intracranial mass. No midline shift. Vascular: No hyperdense vessel. Atherosclerotic calcifications. Skull: No fracture or aggressive osseous lesion. CT MAXILLOFACIAL FINDINGS Osseous: No evidence of acute maxillofacial fracture. Orbits: No acute orbital finding. Sinuses: Mild mucosal thickening within the bilateral maxillary sinuses. Soft tissues: No maxillofacial hematoma is appreciable by CT. CT CERVICAL SPINE FINDINGS Alignment: Straightening of the expected cervical lordosis. 1-2 mm grade 1 anterolisthesis at C2-C3 and C3-C4. Skull  base and vertebrae: The basion-dental and atlanto-dental intervals are maintained.No evidence of acute fracture to the cervical spine. 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 Soft tissues and spinal canal: No prevertebral fluid or swelling. No visible canal hematoma. Disc levels: Prior C5-C7 ACDF. Solid arthrodesis at C5-C6 and C6-C7. No evidence of acute hardware compromise. Cervical spondylosis with multilevel disc space narrowing, disc bulges/central disc protrusions, uncovertebral hypertrophy and facet arthrosis. Disc space narrowing is greatest at T1-T2 (advanced at this level). No appreciable high-grade spinal canal stenosis. Multilevel bony neural foraminal narrowing. Degenerative changes are also present at the C1-C2 articulation. Upper chest: No consolidation within the imaged lung apices. No visible pneumothorax. Other: Atherosclerotic plaque within the visualized aortic arch, proximal major branch vessels of the neck and carotid arteries. IMPRESSION: CT head: 1. No evidence of acute intracranial abnormality. 2. Mild chronic small vessel ischemic changes within the cerebral white matter. 3. Mild generalized cerebral atrophy. CT maxillofacial: No evidence of acute maxillofacial fracture. CT cervical spine: 1. No evidence of acute fracture to the cervical spin 2. 1-2 mm grade 1 anterolisthesis at C2-C3 and C3-C4. 3. Prior C5-C7 ACDF. No evidence of acute hardware compromise. 4. Cervical spondylosis, as described. 5. Facet joint ankylosis on the right at C3-C4, and  bilaterally at C4-C5. Electronically Signed   By: Kellie Simmering D.O.   On: 07/04/2022 09:46    Cardiac Studies  TTE 06/26/2022  1. Left ventricular ejection fraction, by estimation, is 60 to 65%. The  left ventricle has normal function. The left ventricle has no regional  wall motion abnormalities. Left ventricular diastolic function could not  be evaluated.   2. Right ventricular systolic function is normal.  The right ventricular  size is normal. There is mildly elevated pulmonary artery systolic  pressure. The estimated right ventricular systolic pressure is 57.2 mmHg.   3. The mitral valve is normal in structure. No evidence of mitral valve  regurgitation. No evidence of mitral stenosis.   4. The aortic valve is tricuspid. Aortic valve regurgitation is not  visualized. Aortic valve sclerosis/calcification is present, without any  evidence of aortic stenosis.   5. There is mild dilatation of the ascending aorta, measuring 39 mm.   6. The inferior vena cava is normal in size with greater than 50%  respiratory variability, suggesting right atrial pressure of 3 mmHg.   NM Stress 06/25/2022   The study is normal. The study is low risk.   No ST deviation was noted.  Right bundle branch block noted as baseline with atrial fibrillation.   LV perfusion is normal. There is no evidence of ischemia. There is no evidence of infarction.   Left ventricular function is normal. Nuclear stress EF: 67 %. The left ventricular ejection fraction is hyperdynamic (>65%). End diastolic cavity size is normal. End systolic cavity size is normal.   Patient Profile  Debra Collins is a 86 y.o. female with persistent atrial fibrillation, hypertension, diabetes who was admitted on 07/04/2022 for symptomatic bradycardia in the setting of flecainide, atenolol and diltiazem use.  Assessment & Plan   #Symptomatic bradycardia #Sinus node arrest # Persistent atrial fibrillation -Admitted with symptomatic bradycardia.  This was thought to be related to the use of atenolol, flecainide and diltiazem.  Temporary pacing wire was placed. -Seems to be doing better but has had return of atrial fibrillation. -Her acute kidney injury has resolved.  She appears to be stable.  She is mentating well.  Again she is not requiring any pacing support. -She is now back in atrial fibrillation.  Heart rates 90 to 110 bpm.  I suspect she will  need some sort of AV nodal agent.  I discussed her case briefly with Dr. Caryl Comes who is on-call for electrophysiology.  Our options include rate control to determine if she will need pacing support with transvenous pacing in place.  Other option includes lenient rate control with possible return to the hospital for Tikosyn loading.  Could also possibly be considered for outpatient ablation.  Dr. Caryl Comes will see the patient this morning to help [place] manage her atrial fibrillation in the setting of significant bradycardia requiring temporary pacing support. -Transition to heparin which we will continue.  #Persistent Afib s/p DCCV -Status post cardioversion on 06/13/2022. -On heparin drip due to recent cardioversion.  Transition back to Eliquis as able.  #HTN -Restart home olmesartan. -Hold AV nodal agents.  #AKI -Resolved.  Secondary to bradycardia.  #DM -Most recent A1c 6.8.  Not on home medications. -Glucose has been high here. -Sliding scale insulin while in house.  FEN -No intravenous fluids -Diet: Heart healthy -DVT PPx: Heparin drip -Code: Full -Disposition: Remain in ICU with temporary pacing support until definitive plan for A-fib management has been determined.  For questions or updates,  please contact [place] Please consult [email or web address] for contact info under   Signed, [place] B[place] T. Audie Box, MD, [place]  07/06/2022 7:49 AM

## 2022-07-06 NOTE — Progress Notes (Signed)
ANTICOAGULATION CONSULT NOTE   Pharmacy Consult for Heparin Indication: atrial fibrillation  No Known Allergies  Patient Measurements: Height: '5\' 3"'$  (160 cm) Weight: 70.3 kg (154 lb 15.7 oz) IBW/kg (Calculated) : 52.4 Heparin Dosing Weight: 67 kg  Vital Signs: Temp: 98.4 F (36.9 C) (12/23 0006) Temp Source: Oral (12/23 0006) BP: 141/78 (12/23 0006) Pulse Rate: 90 (12/23 0007)  Labs: Recent Labs    07/04/22 0846 07/04/22 0858 07/05/22 0454 07/06/22 0243  HGB 13.6 14.3  --  14.7  HCT 42.2 42.0  --  44.7  PLT 151  --   --  130*  APTT  --   --   --  49*  HEPARINUNFRC  --   --   --  0.54  CREATININE 1.21* 1.20* 0.70 0.59  TROPONINIHS 7  --   --   --      Estimated Creatinine Clearance: 49.3 mL/min (by C-G formula based on SCr of 0.59 mg/dL).   Medical History: Past Medical History:  Diagnosis Date   DDD (degenerative disc disease), lumbar    Fatty liver disease, nonalcoholic    CT AB 8099   Fibrocystic breast disease    Hyperlipidemia    Hypertension    Type II or unspecified type diabetes mellitus without mention of complication, not stated as uncontrolled    Vitamin D deficiency     Medications:  Medications Prior to Admission  Medication Sig Dispense Refill Last Dose   apixaban (ELIQUIS) 5 MG TABS tablet Take 1 tablet (5 mg total) by mouth 2 (two) times daily. 60 tablet 5 07/03/2022 at 1830   aspirin EC 81 MG tablet Take 81 mg by mouth daily. Swallow whole.   07/03/2022   atenolol (TENORMIN) 100 MG tablet Take  1 tablet  Daily  for BP                                                                    /                                TAKE                          BY                    MOUTH (Patient taking differently: Take 50 mg by mouth every evening.) 90 tablet 3 07/03/2022   atorvastatin (LIPITOR) 40 MG tablet Take 1 tablet daily as directed for cholesterol. (Patient taking differently: Take 40 mg by mouth every evening.) 90 tablet 3 07/03/2022    Brimonidine Tartrate (LUMIFY) 0.025 % SOLN Place 1 drop into both eyes 2 (two) times daily as needed (eye redness/irritation.).   Past Week   buPROPion (WELLBUTRIN XL) 150 MG 24 hr tablet Take 150 mg by mouth in the morning.   07/03/2022   Cholecalciferol (VITAMIN D-3 PO) Take 1 tablet by mouth in the morning.   07/03/2022   flecainide (TAMBOCOR) 50 MG tablet Take 1 tablet (50 mg total) by mouth 2 (two) times daily. 180 tablet 3    meloxicam (MOBIC) 15 MG tablet Take 1/2 to 1 tablet daily with  food for Pain / Inflammation & Limit to 5 days/week to prevent Kidney Damage (Patient taking differently: Take 15 mg by mouth every Monday, Wednesday, and Friday.) 90 tablet 1 07/03/2022   Multiple Vitamin (MULTIVITAMIN WITH MINERALS) TABS tablet Take 1 tablet by mouth in the morning.   07/03/2022   olmesartan (BENICAR) 40 MG tablet Take  1 tablet  Daily for BP                                                                     /                                   TAKE                                  BY                               MOUTH (Patient taking differently: Take 40 mg by mouth daily.) 90 tablet 3 07/03/2022   omeprazole (PRILOSEC) 20 MG capsule Take  1 capsule  Daily  to Prevent Heartburn & Indigestion                                    /                                TAKE                          BY                       MOUTH (Patient taking differently: Take 20 mg by mouth daily.) 90 capsule 3 07/04/2022   vitamin B-12 (CYANOCOBALAMIN) 1000 MCG tablet Take 1,000 mcg by mouth daily.   07/03/2022   diltiazem (CARTIA XT) 240 MG 24 hr capsule Take  1 capsule  Daily  for BP & Heart Rhythm  ( STOP VERAPAMIL ) (Patient taking differently: Take 240 mg by mouth every evening.) 90 capsule 1     Assessment: 86 y.o. F presents with symptomatic bradycardia. S/p temp wire this morning.  Pt on eliquis PTA for afib - holding in case further procedures needed. Last dose 12/20 1830. Noted plan for DCCV 07/19/21. To  begin heparin gtt while apixaban on hold. Apixaban may still be affecting heparin levels so will utilize aPTT for monitoring until levels correlate. CBC ok on admission.  aPTT 49 seconds and heparin level 0.54 - not correlating at this time 2/2 to the effects of prior to admission apixaban. No issues with infusion or overt s/sx of bleeding reported.   Goal of Therapy:  Heparin level 0.3-0.7 units/ml aPTT 66-102 seconds Monitor platelets by anticoagulation protocol: Yes   Plan:  Increase heparin gtt to 1150 units/hr Will f/u heparin level and aPTT in 8 hours Daily heparin level, aPTT, and CBC  Georga Bora, PharmD Clinical Pharmacist 07/06/2022 4:05 AM Please check AMION for all [place] numbers

## 2022-07-08 ENCOUNTER — Other Ambulatory Visit: Payer: Self-pay | Admitting: Student

## 2022-07-08 DIAGNOSIS — I4819 Other persistent atrial fibrillation: Secondary | ICD-10-CM

## 2022-07-08 DIAGNOSIS — R001 Bradycardia, unspecified: Secondary | ICD-10-CM

## 2022-07-08 NOTE — Progress Notes (Signed)
Ordered 2 week live [facility] for further evaluation of bradycardia and rapid atrial fibrillation per Dr. Olin Pia recommendation. Patient is scheduled for outpatient DCCV on 07/19/2021 so Dr. Caryl Comes said he would recommend she wear the monitor after this. Please see discharge summary from 07/06/2022 for more information.  Darreld Mclean, PA-C 07/08/2022 12:04 PM

## 2022-07-09 ENCOUNTER — Other Ambulatory Visit (INDEPENDENT_AMBULATORY_CARE_PROVIDER_SITE_OTHER): Payer: PPO

## 2022-07-09 DIAGNOSIS — R001 Bradycardia, unspecified: Secondary | ICD-10-CM

## 2022-07-09 DIAGNOSIS — I4819 Other persistent atrial fibrillation: Secondary | ICD-10-CM

## 2022-07-09 NOTE — Progress Notes (Unsigned)
Enrolled patient for a 14 day Zio AT monitor to be mailed to patients home to be worn after cardioverson on 1/5   Dr Sallyanne Kuster to read

## 2022-07-10 ENCOUNTER — Encounter: Payer: Self-pay | Admitting: Nurse Practitioner

## 2022-07-10 ENCOUNTER — Ambulatory Visit (INDEPENDENT_AMBULATORY_CARE_PROVIDER_SITE_OTHER): Payer: PPO | Admitting: Nurse Practitioner

## 2022-07-10 VITALS — BP 134/64 | HR 79 | Temp 97.5°F | Ht 63.0 in | Wt 154.0 lb

## 2022-07-10 DIAGNOSIS — E785 Hyperlipidemia, unspecified: Secondary | ICD-10-CM | POA: Diagnosis not present

## 2022-07-10 DIAGNOSIS — N182 Chronic kidney disease, stage 2 (mild): Secondary | ICD-10-CM | POA: Diagnosis not present

## 2022-07-10 DIAGNOSIS — E1169 Type 2 diabetes mellitus with other specified complication: Secondary | ICD-10-CM

## 2022-07-10 DIAGNOSIS — I10 Essential (primary) hypertension: Secondary | ICD-10-CM | POA: Diagnosis not present

## 2022-07-10 DIAGNOSIS — E1122 Type 2 diabetes mellitus with diabetic chronic kidney disease: Secondary | ICD-10-CM

## 2022-07-10 DIAGNOSIS — I4819 Other persistent atrial fibrillation: Secondary | ICD-10-CM

## 2022-07-10 DIAGNOSIS — E875 Hyperkalemia: Secondary | ICD-10-CM | POA: Diagnosis not present

## 2022-07-10 MED ORDER — METFORMIN HCL ER 500 MG PO TB24: 500.0000 mg | ORAL_TABLET | Freq: Two times a day (BID) | ORAL | 6 refills | Status: DC

## 2022-07-10 NOTE — Patient Instructions (Signed)

## 2022-07-10 NOTE — Progress Notes (Signed)
Hospital follow up  Assessment and Plan: Hospital visit follow up for:   Debra Collins was seen today for hospitalization follow-up.  Diagnoses and all orders for this visit:  Type 2 diabetes mellitus with stage 2 chronic kidney disease, without long-term current use of insulin ([place]) Given diet information- would recommend meeting with nutritionist after cardioversion -     metFORMIN (GLUCOPHAGE-XR) 500 MG 24 hr tablet; Take 1 tablet (500 mg total) by mouth 2 (two) times daily with a meal. -     CBC with Differential/Platelet -     COMPLETE METABOLIC PANEL WITH GFR  Persistent atrial fibrillation (HCC) Continue medications and follow with cardiology Has cardioversion scheduled for 07/19/22 Go to the ER if any chest pain, shortness of breath, nausea, dizziness, severe HA, changes vision/speech  Essential hypertension - continue medications, DASH diet, exercise and monitor at home. Call if greater than 130/80.   Hyperlipidemia associated with type 2 diabetes mellitus (HCC) Continue atorvastatin, diet and exercise  Hyperkalemia -CMP     All medications were reviewed with patient and family and fully reconciled. All questions answered fully, and patient and family members were encouraged to call the office with any further questions or concerns. Discussed goal to avoid readmission related to this diagnosis.    Over 40 minutes of exam, counseling, chart review, and complex, high/moderate level critical decision making was performed this visit.   Future Appointments  Date Time Provider [place]  08/09/2022  2:40 PM Croitoru, Dani Gobble, MD [facility] None  08/12/2022 10:30 AM Darrol Jump, NP [facility] None  11/14/2022 10:30 AM Unk Pinto, MD [facility] None  05/12/2023 10:00 AM Unk Pinto, MD [facility] None     HPI 86 y.o.female presents for follow up for transition from recent hospitalization or [facility] stay. Admit date to the hospital was 07/04/22, patient was  discharged from the hospital on 07/06/22 and our clinical staff contacted the office the day after discharge to set up a follow up appointment. The discharge summary, medications, and diagnostic test results were reviewed before meeting with the patient. The patient was admitted for:   BP is currently controlled on Amiodarone 200 mg BID, Amlodipine 2.5 mg, Olmesartan 40 mg QD. Atenolol and Diltiazem and flecanide have been stopped by cardiology- scheduled cardioversion 07/19/22.  BP Readings from Last 3 Encounters:  07/10/22 134/64  07/06/22 (!) 150/87  06/20/22 127/61   BMI is Body mass index is 27.28 kg/m., Wt Readings from Last 3 Encounters:  07/10/22 154 lb (69.9 kg)  07/04/22 154 lb 15.7 oz (70.3 kg)  06/25/22 160 lb (72.6 kg)    Symptomatic Bradycardia Persistent Atrial Fibrillation RBBB Patient was admitted with symptomatic bradycardia as stated above.  Initially noted to be in a junctional rhythm with rates in the 20s.  She received Atropine with mild improvement and rates the 30s.  She was on atenolol, diltiazem, and flecainide at home so all of these were held and a temporary pacemaker was placed while all AV nodal agents washed out.  She regained AV conduction with minimal ventricular pacing.  However, she then went back into atrial fibrillation with heart rates in the 90s to 110s.  Dr. Caryl Comes recommended starting Amiodarone (not felt to be a candidate for Tikosyn given QTc prolongation). She was started on Amiodarone '200mg'$  twice daily.  She is already scheduled for an outpatient cardioversion on 07/19/2022 so we will keep this as scheduled.  Hopefully, she will maintain sinus rhythm after Amiodarone load.  She will remain off all AV  nodal agents.  Continue Eliquis 5 mg twice daily.  Temp wire was pulled on 07/06/2022 and she was able to ambulate without any issues. Therefore, she is felt to be stable for discharge advised patient to let us know if heart rates are remaining uncontrolled  prior to cardioversion. Dr. Caryl Comes recommended outpatient MCOT to help assess rate control - this can be placed next week given the slow accumulation of Amiodarone. Outpatient PFTs were also recommended to have as a baseline since we are starting Amiodarone - this can be arranged at follow-up visit. LFTs and TSH were normal in 04/2022. Recommend repeat LFTs and TSH in about 6 weeks.   Hypertension BP mildly elevated during admission. Continue home Olmesartan '40mg'$  daily. Started on Amlodipine 2.'5mg'$  daily since Atenolol and Diltiazem were stopped.   Hyperlipidemia Continue home Lipitor '40mg'$  daily.   Type 2 Diabetes Mellitus Hemoglobin A1c 6.8% in 04/2022. Blood sugars were running as high as the 300s at times this admission. Patient currently not on any diabetes medications. Therefore, Dr. Caryl Comes recommended starting Metformin '500mg'$  twice daily. She states she has been on this before but did not like the way it made her feel. She denies any significant GI issues on it but states she just had a generalized sense of unwellness on it. It has been a couple of years since she has been on Metformin. She is willing to retry it. Therefore, will prescribe Metformin '500mg'$  twice daily. Advised patient to follow-up with PCP for further management of this.   AKI - Resolved Hyperkalemia - Resolved Creatinine 1.2 on arrival. Baseline around 0.8. Likely due to hypoperfusion in setting of bradycardia. Creatinine improved with improvement in heart rate. She was also slightly hyperkalemia on arrival with potassium of 5.4. improved without any specific intervention and she was able to be restarted on ARB. Creatinine 0.59 and potassium 4.0 on day of discharge.    Patient seen and examined by Dr. Caryl Comes and felt to be stable for discharge. Outpatient follow-up arranged.  STOP taking these medications     aspirin EC 81 MG tablet    atenolol 100 MG tablet Commonly known as: TENORMIN    diltiazem 240 MG 24 hr  capsule Commonly known as: Cartia XT    flecainide 50 MG tablet Commonly known as: TAMBOCOR    meloxicam 15 MG tablet Commonly known as: [facility] is not involved.   Images while in the hospital: [facility] Chest Port 1V same Day  Result Date: 07/04/2022 CLINICAL DATA:  Line placement EXAM: PORTABLE CHEST 1 VIEW COMPARISON:  Chest x-ray 07/04/2022, 09/04/2016 FINDINGS: Hardware in the cervical spine. Cardiomegaly with vascular congestion and probable mild interstitial edema. Areas of subsegmental atelectasis. No pleural effusion. Aortic atherosclerosis. Right IJ pacing lead with tip over the right ventricular area. No pneumothorax. IMPRESSION: 1. Right IJ pacing lead with tip over the right ventricular area. No pneumothorax. 2. Cardiomegaly with vascular congestion and probable mild interstitial edema. Electronically Signed   By: Donavan Foil M.D.   On: 07/04/2022 18:02   CARDIAC CATHETERIZATION  Result Date: 07/04/2022 Successful placement of temporary transvenous pacemaker via right Internal jugular vein.   DG Chest Port 1 View  Result Date: 07/04/2022 CLINICAL DATA:  Weakness EXAM: PORTABLE CHEST 1 VIEW COMPARISON:  Portable exam 0950 hours compared to 05/12/2017 FINDINGS: External pacing leads project over chest. Enlargement of cardiac silhouette. Atherosclerotic calcification aorta. Pulmonary vascularity normal. Minimal bibasilar atelectasis. Remaining lungs clear. No pleural effusion, pneumothorax, or  acute osseous findings. IMPRESSION: Minimal bibasilar atelectasis. Enlargement of cardiac silhouette. Aortic Atherosclerosis (ICD10-I70.0). Electronically Signed   By: Lav[place]  M.D.   On: 07/04/2022 10:07   CT Head Wo Contrast  Result Date: 07/04/2022 CLINICAL DATA:  Provided history: Head trauma, minor. Facial trauma, blunt. Neck trauma. EXAM: CT HEAD WITHOUT CONTRAST CT MAXILLOFACIAL WITHOUT CONTRAST CT CERVICAL SPINE WITHOUT CONTRAST TECHNIQUE: Multidetector CT imaging  of the head, cervical spine, and maxillofacial structures were performed using the standard protocol without intravenous contrast. Multiplanar CT image reconstructions of the cervical spine and maxillofacial structures were also generated. RADIATION DOSE REDUCTION: This exam was performed according to the departmental dose-optimization program which includes automated exposure control, adjustment of the mA and/or kV according to patient size and/or use of iterative reconstruction technique. COMPARISON:  Brain MRI 10/19/2016. Cervical spine CT 11/11/2016. FINDINGS: CT HEAD FINDINGS Brain: Mild generalized cerebral atrophy. Mild patchy and ill-defined hypoattenuation within the cerebral white matter, nonspecific but compatible with chronic small vessel ischemic disease. There is no acute intracranial hemorrhage. No demarcated cortical infarct. No extra-axial fluid collection. No evidence of an intracranial mass. No midline shift. Vascular: No hyperdense vessel. Atherosclerotic calcifications. Skull: No fracture or aggressive osseous lesion. CT MAXILLOFACIAL FINDINGS Osseous: No evidence of acute maxillofacial fracture. Orbits: No acute orbital finding. Sinuses: Mild mucosal thickening within the bilateral maxillary sinuses. Soft tissues: No maxillofacial hematoma is appreciable by CT. CT CERVICAL SPINE FINDINGS Alignment: Straightening of the expected cervical lordosis. 1-2 mm grade 1 anterolisthesis at C2-C3 and C3-C4. Skull base and vertebrae: The basion-dental and atlanto-dental intervals are maintained.No evidence of acute fracture to the cervical spine. 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 Soft tissues and spinal canal: No prevertebral fluid or swelling. No visible canal hematoma. Disc levels: Prior C5-C7 ACDF. Solid arthrodesis at C5-C6 and C6-C7. No evidence of acute hardware compromise. Cervical spondylosis with multilevel disc space narrowing, disc bulges/central disc  protrusions, uncovertebral hypertrophy and facet arthrosis. Disc space narrowing is greatest at T1-T2 (advanced at this level). No appreciable high-grade spinal canal stenosis. Multilevel bony neural foraminal narrowing. Degenerative changes are also present at the C1-C2 articulation. Upper chest: No consolidation within the imaged lung apices. No visible pneumothorax. Other: Atherosclerotic plaque within the visualized aortic arch, proximal major branch vessels of the neck and carotid arteries. IMPRESSION: CT head: 1. No evidence of acute intracranial abnormality. 2. Mild chronic small vessel ischemic changes within the cerebral white matter. 3. Mild generalized cerebral atrophy. CT maxillofacial: No evidence of acute maxillofacial fracture. CT cervical spine: 1. No evidence of acute fracture to the cervical spin 2. 1-2 mm grade 1 anterolisthesis at C2-C3 and C3-C4. 3. Prior C5-C7 ACDF. No evidence of acute hardware compromise. 4. Cervical spondylosis, as described. 5. 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 Electronically Signed   By: Kellie Simmering D.O.   On: 07/04/2022 09:46   CT Cervical Spine Wo Contrast  Result Date: 07/04/2022 CLINICAL DATA:  Provided history: Head trauma, minor. Facial trauma, blunt. Neck trauma. EXAM: CT HEAD WITHOUT CONTRAST CT MAXILLOFACIAL WITHOUT CONTRAST CT CERVICAL SPINE WITHOUT CONTRAST TECHNIQUE: Multidetector CT imaging of the head, cervical spine, and maxillofacial structures were performed using the standard protocol without intravenous contrast. Multiplanar CT image reconstructions of the cervical spine and maxillofacial structures were also generated. RADIATION DOSE REDUCTION: This exam was performed according to the departmental dose-optimization program which includes automated exposure control, adjustment of the mA and/or kV according to  patient size and/or use of iterative reconstruction technique. COMPARISON:  Brain MRI 10/19/2016.  Cervical spine CT 11/11/2016. FINDINGS: CT HEAD FINDINGS Brain: Mild generalized cerebral atrophy. Mild patchy and ill-defined hypoattenuation within the cerebral white matter, nonspecific but compatible with chronic small vessel ischemic disease. There is no acute intracranial hemorrhage. No demarcated cortical infarct. No extra-axial fluid collection. No evidence of an intracranial mass. No midline shift. Vascular: No hyperdense vessel. Atherosclerotic calcifications. Skull: No fracture or aggressive osseous lesion. CT MAXILLOFACIAL FINDINGS Osseous: No evidence of acute maxillofacial fracture. Orbits: No acute orbital finding. Sinuses: Mild mucosal thickening within the bilateral maxillary sinuses. Soft tissues: No maxillofacial hematoma is appreciable by CT. CT CERVICAL SPINE FINDINGS Alignment: Straightening of the expected cervical lordosis. 1-2 mm grade 1 anterolisthesis at C2-C3 and C3-C4. Skull base and vertebrae: The basion-dental and atlanto-dental intervals are maintained.No evidence of acute fracture to the cervical spine. 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 Soft tissues and spinal canal: No prevertebral fluid or swelling. No visible canal hematoma. Disc levels: Prior C5-C7 ACDF. Solid arthrodesis at C5-C6 and C6-C7. No evidence of acute hardware compromise. Cervical spondylosis with multilevel disc space narrowing, disc bulges/central disc protrusions, uncovertebral hypertrophy and facet arthrosis. Disc space narrowing is greatest at T1-T2 (advanced at this level). No appreciable high-grade spinal canal stenosis. Multilevel bony neural foraminal narrowing. Degenerative changes are also present at the C1-C2 articulation. Upper chest: No consolidation within the imaged lung apices. No visible pneumothorax. Other: Atherosclerotic plaque within the visualized aortic arch, proximal major branch vessels of the neck and carotid arteries. IMPRESSION: CT head: 1. No evidence  of acute intracranial abnormality. 2. Mild chronic small vessel ischemic changes within the cerebral white matter. 3. Mild generalized cerebral atrophy. CT maxillofacial: No evidence of acute maxillofacial fracture. CT cervical spine: 1. No evidence of acute fracture to the cervical spin 2. 1-2 mm grade 1 anterolisthesis at C2-C3 and C3-C4. 3. Prior C5-C7 ACDF. No evidence of acute hardware compromise. 4. Cervical spondylosis, as described. 5. 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 Electronically Signed   By: Kellie Simmering D.O.   On: 07/04/2022 09:46   CT Maxillofacial Wo Contrast  Result Date: 07/04/2022 CLINICAL DATA:  Provided history: Head trauma, minor. Facial trauma, blunt. Neck trauma. EXAM: CT HEAD WITHOUT CONTRAST CT MAXILLOFACIAL WITHOUT CONTRAST CT CERVICAL SPINE WITHOUT CONTRAST TECHNIQUE: Multidetector CT imaging of the head, cervical spine, and maxillofacial structures were performed using the standard protocol without intravenous contrast. Multiplanar CT image reconstructions of the cervical spine and maxillofacial structures were also generated. RADIATION DOSE REDUCTION: This exam was performed according to the departmental dose-optimization program which includes automated exposure control, adjustment of the mA and/or kV according to patient size and/or use of iterative reconstruction technique. COMPARISON:  Brain MRI 10/19/2016. Cervical spine CT 11/11/2016. FINDINGS: CT HEAD FINDINGS Brain: Mild generalized cerebral atrophy. Mild patchy and ill-defined hypoattenuation within the cerebral white matter, nonspecific but compatible with chronic small vessel ischemic disease. There is no acute intracranial hemorrhage. No demarcated cortical infarct. No extra-axial fluid collection. No evidence of an intracranial mass. No midline shift. Vascular: No hyperdense vessel. Atherosclerotic calcifications. Skull: No fracture or aggressive osseous lesion. CT MAXILLOFACIAL  FINDINGS Osseous: No evidence of acute maxillofacial fracture. Orbits: No acute orbital finding. Sinuses: Mild mucosal thickening within the bilateral maxillary sinuses. Soft tissues: No maxillofacial hematoma is appreciable by CT. CT CERVICAL SPINE FINDINGS Alignment: Straightening of the expected cervical lordosis. 1-2  mm grade 1 anterolisthesis at C2-C3 and C3-C4. Skull base and vertebrae: The basion-dental and atlanto-dental intervals are maintained.No evidence of acute fracture to the cervical spine. 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 Soft tissues and spinal canal: No prevertebral fluid or swelling. No visible canal hematoma. Disc levels: Prior C5-C7 ACDF. Solid arthrodesis at C5-C6 and C6-C7. No evidence of acute hardware compromise. Cervical spondylosis with multilevel disc space narrowing, disc bulges/central disc protrusions, uncovertebral hypertrophy and facet arthrosis. Disc space narrowing is greatest at T1-T2 (advanced at this level). No appreciable high-grade spinal canal stenosis. Multilevel bony neural foraminal narrowing. Degenerative changes are also present at the C1-C2 articulation. Upper chest: No consolidation within the imaged lung apices. No visible pneumothorax. Other: Atherosclerotic plaque within the visualized aortic arch, proximal major branch vessels of the neck and carotid arteries. IMPRESSION: CT head: 1. No evidence of acute intracranial abnormality. 2. Mild chronic small vessel ischemic changes within the cerebral white matter. 3. Mild generalized cerebral atrophy. CT maxillofacial: No evidence of acute maxillofacial fracture. CT cervical spine: 1. No evidence of acute fracture to the cervical spin 2. 1-2 mm grade 1 anterolisthesis at C2-C3 and C3-C4. 3. Prior C5-C7 ACDF. No evidence of acute hardware compromise. 4. Cervical spondylosis, as described. 5. 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 Electronically Signed    By: Kellie Simmering D.O.   On: 07/04/2022 09:46     Current Outpatient Medications (Endocrine & Metabolic):    metformin (FORTAMET) 500 MG (OSM) 24 hr tablet, Take 1 tablet (500 mg total) by mouth 2 (two) times daily with a meal.  Current Outpatient Medications (Cardiovascular):    amiodarone (PACERONE) 200 MG tablet, Take 1 tablet (200 mg total) by mouth 2 (two) times daily.   amLODipine (NORVASC) 2.5 MG tablet, Take 1 tablet (2.5 mg total) by mouth daily.   atorvastatin (LIPITOR) 40 MG tablet, Take 1 tablet daily as directed for cholesterol. (Patient taking differently: Take 40 mg by mouth every evening.)   olmesartan (BENICAR) 40 MG tablet, Take  1 tablet  Daily for BP                                                                     /                                   TAKE                                  BY                               MOUTH (Patient taking differently: Take 40 mg by mouth daily.)    Current Outpatient Medications (Hematological):    apixaban (ELIQUIS) 5 MG TABS tablet, Take 1 tablet (5 mg total) by mouth 2 (two) times daily.   vitamin B-12 (CYANOCOBALAMIN) 1000 MCG tablet, Take 1,000 mcg by mouth daily.  Current Outpatient Medications (Other):    Brimonidine Tartrate (LUMIFY) 0.025 % SOLN, Place  1 drop into both eyes 2 (two) times daily as needed (eye redness/irritation.).   buPROPion (WELLBUTRIN XL) 150 MG 24 hr tablet, Take 150 mg by mouth in the morning.   Cholecalciferol (VITAMIN D-3 PO), Take 1 tablet by mouth in the morning.   Multiple Vitamin (MULTIVITAMIN WITH MINERALS) TABS tablet, Take 1 tablet by mouth in the morning.   omeprazole (PRILOSEC) 20 MG capsule, Take  1 capsule  Daily  to Prevent Heartburn & Indigestion                                    /                                TAKE                          BY                       MOUTH (Patient taking differently: Take 20 mg by mouth daily.)  Past Medical History:  Diagnosis Date   DDD (degenerative  disc disease), lumbar    Fatty liver disease, nonalcoholic    CT AB 6948   Fibrocystic breast disease    Hyperlipidemia    Hypertension    Type II or unspecified type diabetes mellitus without mention of complication, not stated as uncontrolled    Vitamin D deficiency      No Known Allergies  ROS: all negative except above.   Physical Exam: Filed Weights   07/10/22 0958  Weight: 154 lb (69.9 kg)   BP 134/64   Pulse 79   Temp (!) 97.5 F (36.4 C)   Ht '5\' 3"'$  (1.6 m)   Wt 154 lb (69.9 kg)   SpO2 97%   BMI 27.28 kg/m  General Appearance: Well nourished, in no apparent distress. Eyes: PERRLA, EOMs, conjunctiva no swelling or erythema Neck: Supple, thyroid normal.  Respiratory: Respiratory effort normal, BS equal bilaterally without rales, rhonchi, wheezing or stridor.  Cardio: Irregularly irregular rate. Brisk peripheral pulses without edema.  Abdomen: Soft, + BS.  Non tender, no guarding, rebound, hernias, masses. Lymphatics: Non tender without lymphadenopathy.  Musculoskeletal: Full ROM, 5/5 strength, normal gait.  Skin: Warm, dry without rashes, lesions, ecchymosis.  Neuro: Cranial nerves intact. Normal muscle tone, no cerebellar symptoms. Sensation intact.  Psych: Awake and oriented X 3, normal affect, Insight and Judgment appropriate.     Alycia Rossetti, NP 10:10 AM [facility]

## 2022-07-11 LAB — CBC WITH DIFFERENTIAL/PLATELET
Absolute Monocytes: 799 cells/uL (ref 200–950)
Basophils Absolute: 30 cells/uL (ref 0–200)
Basophils Relative: 0.4 %
Eosinophils Absolute: 148 cells/uL (ref 15–500)
Eosinophils Relative: 2 %
HCT: 43.4 % (ref 35.0–45.0)
Hemoglobin: 14.9 g/dL (ref 11.7–15.5)
Lymphs Abs: 1391 cells/uL (ref 850–3900)
MCH: 33.5 pg — ABNORMAL HIGH (ref 27.0–33.0)
MCHC: 34.3 g/dL (ref 32.0–36.0)
MCV: 97.5 fL (ref 80.0–100.0)
MPV: 10.9 fL (ref 7.5–12.5)
Monocytes Relative: 10.8 %
Neutro Abs: 5032 cells/uL (ref 1500–7800)
Neutrophils Relative %: 68 %
Platelets: 164 10*3/uL (ref 140–400)
RBC: 4.45 10*6/uL (ref 3.80–5.10)
RDW: 12.7 % (ref 11.0–15.0)
Total Lymphocyte: 18.8 %
WBC: 7.4 10*3/uL (ref 3.8–10.8)

## 2022-07-11 LAB — COMPLETE METABOLIC PANEL WITH GFR
AG Ratio: 2 (calc) (ref 1.0–2.5)
ALT: 26 U/L (ref 6–29)
AST: 28 U/L (ref 10–35)
Albumin: 4.5 g/dL (ref 3.6–5.1)
Alkaline phosphatase (APISO): 59 U/L (ref 37–153)
BUN: 16 mg/dL (ref 7–25)
CO2: 29 mmol/L (ref 20–32)
Calcium: 9.9 mg/dL (ref 8.6–10.4)
Chloride: 102 mmol/L (ref 98–110)
Creat: 0.72 mg/dL (ref 0.60–0.95)
Globulin: 2.2 g/dL (calc) (ref 1.9–3.7)
Glucose, Bld: 153 mg/dL — ABNORMAL HIGH (ref 65–99)
Potassium: 4.5 mmol/L (ref 3.5–5.3)
Sodium: 140 mmol/L (ref 135–146)
Total Bilirubin: 0.8 mg/dL (ref 0.2–1.2)
Total Protein: 6.7 g/dL (ref 6.1–8.1)
eGFR: 82 mL/min/{1.73_m2} (ref >=60)

## 2022-07-16 DIAGNOSIS — I480 Paroxysmal atrial fibrillation: Secondary | ICD-10-CM | POA: Diagnosis not present

## 2022-07-16 DIAGNOSIS — Z01812 Encounter for preprocedural laboratory examination: Secondary | ICD-10-CM | POA: Diagnosis not present

## 2022-07-17 ENCOUNTER — Other Ambulatory Visit: Payer: Self-pay

## 2022-07-17 DIAGNOSIS — I4819 Other persistent atrial fibrillation: Secondary | ICD-10-CM

## 2022-07-17 LAB — CBC WITH DIFFERENTIAL/PLATELET
Basophils Absolute: 0 10*3/uL (ref 0.0–0.2)
Basos: 1 %
EOS (ABSOLUTE): 0.1 10*3/uL (ref 0.0–0.4)
Eos: 2 %
Hematocrit: 45.1 % (ref 34.0–46.6)
Hemoglobin: 15.1 g/dL (ref 11.1–15.9)
Immature Grans (Abs): 0 10*3/uL (ref 0.0–0.1)
Immature Granulocytes: 0 %
Lymphocytes Absolute: 1.5 10*3/uL (ref 0.7–3.1)
Lymphs: 21 %
MCH: 33.8 pg — ABNORMAL HIGH (ref 26.6–33.0)
MCHC: 33.5 g/dL (ref 31.5–35.7)
MCV: 101 fL — ABNORMAL HIGH (ref 79–97)
Monocytes Absolute: 0.8 10*3/uL (ref 0.1–0.9)
Monocytes: 11 %
Neutrophils Absolute: 4.6 10*3/uL (ref 1.4–7.0)
Neutrophils: 65 %
Platelets: 222 10*3/uL (ref 150–450)
RBC: 4.47 x10E6/uL (ref 3.77–5.28)
RDW: 12.6 % (ref 11.7–15.4)
WBC: 7 10*3/uL (ref 3.4–10.8)

## 2022-07-17 LAB — BASIC METABOLIC PANEL
BUN/Creatinine Ratio: 22 (ref 12–28)
BUN: 16 mg/dL (ref 8–27)
CO2: 24 mmol/L (ref 20–29)
Calcium: 9.5 mg/dL (ref 8.7–10.3)
Chloride: 100 mmol/L (ref 96–106)
Creatinine, Ser: 0.74 mg/dL (ref 0.57–1.00)
Glucose: 93 mg/dL (ref 70–99)
Potassium: 4.4 mmol/L (ref 3.5–5.2)
Sodium: 139 mmol/L (ref 134–144)
eGFR: 80 mL/min/{1.73_m2} (ref >59)

## 2022-07-19 ENCOUNTER — Encounter (HOSPITAL_COMMUNITY): Payer: Self-pay | Admitting: Cardiovascular Disease

## 2022-07-19 ENCOUNTER — Other Ambulatory Visit: Payer: Self-pay

## 2022-07-19 ENCOUNTER — Ambulatory Visit (HOSPITAL_COMMUNITY)
Admission: RE | Admit: 2022-07-19 | Discharge: 2022-07-19 | Disposition: A | Discharge status: Home / Self Care | Payer: PPO | Source: Ambulatory Visit | Attending: Cardiovascular Disease | Admitting: Cardiovascular Disease

## 2022-07-19 ENCOUNTER — Encounter (HOSPITAL_COMMUNITY)
Admission: RE | Disposition: A | Discharge status: Home / Self Care | Payer: PPO | Source: Ambulatory Visit | Attending: Cardiovascular Disease

## 2022-07-19 ENCOUNTER — Ambulatory Visit (HOSPITAL_BASED_OUTPATIENT_CLINIC_OR_DEPARTMENT_OTHER): Payer: PPO | Admitting: Anesthesiology

## 2022-07-19 ENCOUNTER — Ambulatory Visit (HOSPITAL_COMMUNITY): Payer: PPO | Admitting: Anesthesiology

## 2022-07-19 DIAGNOSIS — I129 Hypertensive chronic kidney disease with stage 1 through stage 4 chronic kidney disease, or unspecified chronic kidney disease: Secondary | ICD-10-CM

## 2022-07-19 DIAGNOSIS — N189 Chronic kidney disease, unspecified: Secondary | ICD-10-CM

## 2022-07-19 DIAGNOSIS — E1122 Type 2 diabetes mellitus with diabetic chronic kidney disease: Secondary | ICD-10-CM

## 2022-07-19 DIAGNOSIS — Z87891 Personal history of nicotine dependence: Secondary | ICD-10-CM

## 2022-07-19 DIAGNOSIS — I4510 Unspecified right bundle-branch block: Secondary | ICD-10-CM | POA: Insufficient documentation

## 2022-07-19 DIAGNOSIS — I4891 Unspecified atrial fibrillation: Secondary | ICD-10-CM | POA: Diagnosis not present

## 2022-07-19 DIAGNOSIS — E119 Type 2 diabetes mellitus without complications: Secondary | ICD-10-CM | POA: Diagnosis not present

## 2022-07-19 DIAGNOSIS — I4819 Other persistent atrial fibrillation: Secondary | ICD-10-CM | POA: Diagnosis not present

## 2022-07-19 DIAGNOSIS — I10 Essential (primary) hypertension: Secondary | ICD-10-CM | POA: Diagnosis not present

## 2022-07-19 DIAGNOSIS — M1990 Unspecified osteoarthritis, unspecified site: Secondary | ICD-10-CM | POA: Diagnosis not present

## 2022-07-19 DIAGNOSIS — J449 Chronic obstructive pulmonary disease, unspecified: Secondary | ICD-10-CM | POA: Diagnosis not present

## 2022-07-19 HISTORY — PX: CARDIOVERSION: SHX1299

## 2022-07-19 SURGERY — CARDIOVERSION
Anesthesia: General

## 2022-07-19 MED ORDER — PROPOFOL 10 MG/ML IV BOLUS
INTRAVENOUS | Status: DC | PRN
  Administered 2022-07-19: 40 mg via INTRAVENOUS
  Administered 2022-07-19: 10 mg via INTRAVENOUS

## 2022-07-19 MED ORDER — SODIUM CHLORIDE 0.9 % IV SOLN: INTRAVENOUS | Status: DC

## 2022-07-19 NOTE — Op Note (Signed)
Procedure: Electrical Cardioversion Indications:  Atrial Fibrillation  Procedure Details:  Consent: Risks of procedure as well as the alternatives and risks of each were explained to the (patient/caregiver).  Consent for procedure obtained.  Time Out: Verified patient identification, verified procedure, site/side was marked, verified correct patient position, special equipment/implants available, medications/allergies/relevent history reviewed, required imaging and test results available.  Performed  Patient placed on cardiac monitor, pulse oximetry, supplemental oxygen as necessary.  Sedation given:  propofol IV, Dr. Ermalene Postin Pacer pads placed anterior and posterior chest.  Cardioverted 1 time(s).  Cardioversion with synchronized biphasic 120J shock.  Evaluation: Findings: Post procedure EKG shows: NSR Complications: None Patient did tolerate procedure well.  Time Spent Directly with the Patient:  30 minutes   Tejal Monroy 07/19/2022, 10:03 AM

## 2022-07-19 NOTE — Anesthesia Procedure Notes (Signed)
Procedure Name: General with mask airway Date/Time: 07/19/2022 10:03 AM  Performed by: Oleta Mouse, MDPre-anesthesia Checklist: Patient identified, Emergency Drugs available, Suction available, Patient being monitored and Timeout performed Patient Re-evaluated:Patient Re-evaluated prior to induction Oxygen Delivery Method: Ambu bag Preoxygenation: Pre-oxygenation with 100% oxygen Induction Type: IV induction Placement Confirmation: positive ETCO2 Dental Injury: Teeth and Oropharynx as per pre-operative assessment

## 2022-07-19 NOTE — Interval H&P Note (Signed)
History and Physical Interval Note:  07/19/2022 9:02 AM  Debra Collins  has presented today for surgery, with the diagnosis of AFIB.  The various methods of treatment have been discussed with the patient and family. After consideration of risks, benefits and other options for treatment, the patient has consented to  Procedure(s): CARDIOVERSION (N/A) as a surgical intervention.  The patient's history has been reviewed, patient examined, no change in status, stable for surgery.  I have reviewed the patient's chart and labs.  Questions were answered to the patient's satisfaction.     Agustina Witzke

## 2022-07-19 NOTE — Discharge Instructions (Signed)

## 2022-07-19 NOTE — Transfer of Care (Signed)
Immediate Anesthesia Transfer of Care Note  Patient: Cande A Denherder  Procedure(s) Performed: CARDIOVERSION  Patient Location: Endoscopy Unit  Anesthesia Type:General  Level of Consciousness: awake, alert , and oriented  Airway & Oxygen Therapy: Patient Spontanous Breathing  Post-op Assessment: Report given to RN and Post -op Vital signs reviewed and stable  Post vital signs: Reviewed and stable  Last Vitals:  Vitals Value Taken Time  BP 127/69 07/19/22 1003  Temp    Pulse 78 07/19/22 1003  Resp 17 07/19/22 1003  SpO2 95 % 07/19/22 1003  Vitals shown include unvalidated device data.  Last Pain:  Vitals:   07/19/22 0851  TempSrc: Temporal  PainSc: 0-No pain         Complications: No notable events documented.

## 2022-07-21 ENCOUNTER — Encounter (HOSPITAL_COMMUNITY): Payer: Self-pay | Admitting: Cardiovascular Disease

## 2022-07-22 NOTE — Anesthesia Preprocedure Evaluation (Signed)
Anesthesia Evaluation  Patient identified by MRN, date of birth, ID band Patient awake    Reviewed: Allergy & Precautions, NPO status , Patient's Chart, lab work & pertinent test results  History of Anesthesia Complications Negative for: history of anesthetic complications  Airway Mallampati: III  TM Distance: >3 FB Neck ROM: Full    Dental  (+) Dental Advisory Given   Pulmonary COPD, former smoker   breath sounds clear to auscultation       Cardiovascular hypertension, Pt. on medications (-) angina + dysrhythmias Atrial Fibrillation  Rhythm:Irregular     Neuro/Psych  PSYCHIATRIC DISORDERS  Depression       GI/Hepatic ,GERD  Medicated,,  Endo/Other  diabetes, Type 2    Renal/GU CRFRenal disease     Musculoskeletal  (+) Arthritis ,    Abdominal   Peds  Hematology  (+) Blood dyscrasia Eliquis  Lab Results      Component                Value               Date                      WBC                      7.0                 07/16/2022                HGB                      15.1                07/16/2022                HCT                      45.1                07/16/2022                MCV                      101 (H)             07/16/2022                PLT                      222                 07/16/2022              Anesthesia Other Findings   Reproductive/Obstetrics                             Anesthesia Physical Anesthesia Plan  ASA: 3  Anesthesia Plan: General   Post-op Pain Management:    Induction: Intravenous  PONV Risk Score and Plan: 3  Airway Management Planned: Mask  Additional Equipment: None  Intra-op Plan:   Post-operative Plan:   Informed Consent: I have reviewed the patients History and Physical, chart, labs and discussed the procedure including the risks, benefits and alternatives for the proposed anesthesia with the patient or authorized  representative who has indicated his/her understanding and acceptance.  Dental advisory given  Plan Discussed with: CRNA  Anesthesia Plan Comments:        Anesthesia Quick Evaluation

## 2022-07-22 NOTE — Anesthesia Postprocedure Evaluation (Signed)
Anesthesia Post Note  Patient: Debra Collins  Procedure(s) Performed: CARDIOVERSION     Patient location during evaluation: Endoscopy Anesthesia Type: General Level of consciousness: awake and alert Pain management: pain level controlled Vital Signs Assessment: post-procedure vital signs reviewed and stable Respiratory status: spontaneous breathing, nonlabored ventilation and respiratory function stable Cardiovascular status: stable Postop Assessment: no apparent nausea or vomiting Anesthetic complications: no  No notable events documented.  Last Vitals:  Vitals:   07/19/22 1031 07/19/22 1036  BP: 122/70 (!) 130/56  Pulse: 75 73  Resp: 16 (!) 22  Temp:    SpO2: 98% 98%    Last Pain:  Vitals:   07/19/22 1036  TempSrc:   PainSc: 0-No pain                 Darbi Chandran

## 2022-07-31 DIAGNOSIS — I4819 Other persistent atrial fibrillation: Secondary | ICD-10-CM | POA: Diagnosis not present

## 2022-07-31 DIAGNOSIS — R001 Bradycardia, unspecified: Secondary | ICD-10-CM

## 2022-08-02 ENCOUNTER — Telehealth: Payer: Self-pay | Admitting: Cardiovascular Disease

## 2022-08-02 DIAGNOSIS — R001 Bradycardia, unspecified: Secondary | ICD-10-CM | POA: Diagnosis not present

## 2022-08-02 DIAGNOSIS — I4819 Other persistent atrial fibrillation: Secondary | ICD-10-CM | POA: Diagnosis not present

## 2022-08-02 NOTE — Telephone Encounter (Signed)
Received call from [facility]- who states that patients monitor had alert at 1212 am this morning with 1st documentation of afib on monitor 58-85 bpm lasting full 90 second strip.   They were not able to reach patient. I attempted to call patient but unable to reach. Left message to call back. Patient scheduled to see Dr. Loletha Grayer on 1/26 for follow up cardioversion. Strip showed to DOD Dr. Stanford Breed who recommends making Dr. Loletha Grayer aware. Will forward message over.    Per chart patient currently on Amio and Eliquis.

## 2022-08-02 NOTE — Telephone Encounter (Signed)
Called and spoke with patient who states that she did get up early this morning due to her husband turning the heat up in their house and states that she was also having back pain due to a back issue so she states that she felt like her heart may be out of rhythm. Patient checked her BP and pulse on the phone and states that is currently 133/80 with HR of 80. Patient states that she is still taking her Eliquis and her Amiodarone. Patient states that at present she feels well and denies any symptoms. Made patient aware of ED precautions should new or worsening symptoms develop. Patient verbalized understanding.    Will forward to MD to make aware.

## 2022-08-02 NOTE — Telephone Encounter (Signed)
Pt returned call

## 2022-08-02 NOTE — Telephone Encounter (Signed)
Elmo Putt with [facility] calling with abnormal EKG results.

## 2022-08-06 NOTE — Telephone Encounter (Signed)
Spoke with patient who states that she feels like she has not been in atrial fib but patient states that she is getting an alert on her monitor stating that its an unidentifiable reading- patient unable to send readings in to be reviewed. Patient reports at present she feels well and denies any symptoms and states she has more energy now. Advised patient to keep appointment for this Friday with Dr. Loletha Grayer and to bring readings with her. Patient aware and verbalized understanding.

## 2022-08-09 ENCOUNTER — Encounter: Payer: Self-pay | Admitting: Cardiovascular Disease

## 2022-08-09 ENCOUNTER — Ambulatory Visit: Payer: PPO | Attending: Cardiovascular Disease | Admitting: Cardiovascular Disease

## 2022-08-09 VITALS — BP 122/64 | HR 71 | Ht 63.0 in | Wt 144.2 lb

## 2022-08-09 DIAGNOSIS — I10 Essential (primary) hypertension: Secondary | ICD-10-CM | POA: Diagnosis not present

## 2022-08-09 DIAGNOSIS — E119 Type 2 diabetes mellitus without complications: Secondary | ICD-10-CM | POA: Diagnosis not present

## 2022-08-09 DIAGNOSIS — E7800 Pure hypercholesterolemia, unspecified: Secondary | ICD-10-CM

## 2022-08-09 DIAGNOSIS — D6869 Other thrombophilia: Secondary | ICD-10-CM | POA: Diagnosis not present

## 2022-08-09 DIAGNOSIS — Z5181 Encounter for therapeutic drug level monitoring: Secondary | ICD-10-CM | POA: Diagnosis not present

## 2022-08-09 DIAGNOSIS — I4819 Other persistent atrial fibrillation: Secondary | ICD-10-CM

## 2022-08-09 DIAGNOSIS — Z79899 Other long term (current) drug therapy: Secondary | ICD-10-CM

## 2022-08-09 MED ORDER — AMIODARONE HCL 200 MG PO TABS: 200.0000 mg | ORAL_TABLET | Freq: Every day | ORAL | 3 refills | Status: DC

## 2022-08-09 NOTE — Progress Notes (Signed)
Cardiology Office Note:    Date:  08/09/2022   ID:  Debra Collins, DOB 1935-09-15, MRN [id number]  PCP:  Unk Pinto, [place] Providers Cardiologist:  None     Referring MD: Unk Pinto, MD   No chief complaint on file.   History of Present Illness:    Debra Collins is a 87 y.o. female with recurrent symptomatic persistent atrial fibrillation.    Despite good rate control she feels poorly in atrial fibrillation.  She had early recurrence of atrial fibrillation after initial cardioversion, before initiation of antiarrhythmics.    Flecainide was started on 06/25/2022 and the patient developed symptomatic severe junctional bradycardia with rates in the 20s and was hospitalized on 07/04/2022.  She required temporary transvenous pacing.  Flecainide and the AV nodal blockers were stopped and she had recurrent atrial fibrillation.  Dofetilide was not considered a good option due to prolonged QT interval.  She was started on amiodarone 200 mg twice daily.  Cardioversion on 07/19/2022 1 amiodarone was successful and so far she has maintained sinus rhythm for the last 3 weeks.  She is wearing a ZIO monitor that so far has not reported severe bradycardia.    As before she has resolution of fatigue and dyspnea when she returns to normal rhythm.  She has been anticoagulated with Eliquis without interruption and has not had any focal neurological events, falls, bleeding problems.  She does not have edema, orthopnea or PND and has never complained of angina at rest or with activity.  She is not aware of palpitations.  She denies dizziness or syncope.  She has never had a TIA or stroke.  She has a history of diet-controlled diabetes mellitus, treated hypertension and treated hypercholesterolemia, but does not have a history of CAD or PAD.  She had a treadmill stress test at least 30 years ago that was normal, but no subsequent evaluation for CAD.  She does not smoke.  Her  father had a myocardial infarction and died around age 18.  In July 26, 2023 she had a normal myocardial nuclear perfusion study.  Her echocardiogram shows normal left ventricular systolic function and no serious valvular abnormalities.  Although the report states that both atrial normal in size, on my evaluation of the left atrium appears to be mildly dilated.  LV end-systolic diameter is 4.58 cm and the end-systolic volume index the apical four-chamber view was 37 mL/m.  Her echocardiogram was also significant for mild pulmonary artery hypertension at about 40 mmHg (study was performed during atrial fibrillation and diastolic function could not be assessed).  She has mild type 2 diabetes mellitus with a hemoglobin A1c that was 6.8% last October.  During her recent hospitalization she was started on metformin.  She reports having had GI side effects from this in the past and again feels a little unwell on it.  He is willing to continue it for the time being.  She has a favorable lipid profile.  Past Medical History:  Diagnosis Date   DDD (degenerative disc disease), lumbar    Fatty liver disease, nonalcoholic    CT AB 0998   Fibrocystic breast disease    Hyperlipidemia    Hypertension    Type II or unspecified type diabetes mellitus without mention of complication, not stated as uncontrolled    Vitamin D deficiency     Past Surgical History:  Procedure Laterality Date   APPENDECTOMY     CARDIOVERSION N/A 06/13/2022   Procedure:  CARDIOVERSION;  Surgeon: Buford Dresser, MD;  Location[facility] ENDOSCOPY;  Service: Cardiovascular;  Laterality: N/A;   CARDIOVERSION N/A 07/19/2022   Procedure: CARDIOVERSION;  Surgeon: Sanda Klein, MD;  Location: [facility] ENDOSCOPY;  Service: Cardiovascular;  Laterality: N/A;   CATARACT EXTRACTION, BILATERAL Bilateral 09/2017   Dr. Gershon Crane   KNEE ARTHROSCOPY Left    MASTECTOMY Bilateral 1985   Due to severe fibrocystic breast disease   TEMPORARY PACEMAKER N/A  07/04/2022   Procedure: TEMPORARY PACEMAKER;  Surgeon: Martinique, Peter M, MD;  Location: [facility];  Service: Cardiovascular;  Laterality: N/A;    Current Medications: Current Meds  Medication Sig   acetaminophen (TYLENOL) 500 MG tablet Take 1,000 mg by mouth every 6 (six) hours as needed for moderate pain.   amLODipine (NORVASC) 2.5 MG tablet Take 1 tablet (2.5 mg total) by mouth daily.   apixaban (ELIQUIS) 5 MG TABS tablet Take 1 tablet (5 mg total) by mouth 2 (two) times daily.   atorvastatin (LIPITOR) 40 MG tablet Take 1 tablet daily as directed for cholesterol. (Patient taking differently: Take 40 mg by mouth every evening.)   Brimonidine Tartrate (LUMIFY) 0.025 % SOLN Place 1 drop into both eyes 2 (two) times daily as needed (eye redness/irritation.).   buPROPion (WELLBUTRIN XL) 150 MG 24 hr tablet Take 150 mg by mouth in the morning.   Cholecalciferol (DIALYVITE VITAMIN D 5000) 125 MCG (5000 UT) capsule Take 5,000 Units by mouth daily.   metFORMIN (GLUCOPHAGE-XR) 500 MG 24 hr tablet Take 1 tablet (500 mg total) by mouth 2 (two) times daily with a meal.   Multiple Vitamin (MULTIVITAMIN WITH MINERALS) TABS tablet Take 1 tablet by mouth in the morning.   olmesartan (BENICAR) 40 MG tablet Take  1 tablet  Daily for BP                                                                     /                                   TAKE                                  BY                               MOUTH (Patient taking differently: Take 40 mg by mouth daily.)   omeprazole (PRILOSEC) 20 MG capsule Take  1 capsule  Daily  to Prevent Heartburn & Indigestion                                    /                                TAKE                          BY  MOUTH (Patient taking differently: Take 20 mg by mouth daily.)   vitamin B-12 (CYANOCOBALAMIN) 1000 MCG tablet Take 1,000 mcg by mouth daily.   [DISCONTINUED] amiodarone (PACERONE) 200 MG tablet Take 1 tablet (200 mg total)  by mouth 2 (two) times daily.     Allergies:   Patient has no known allergies.   Social History   Socioeconomic History   Marital status: Married    Spouse name: Not on file   Number of children: Not on file   Years of education: Not on file   Highest education level: Not on file  Occupational History   Occupation: retired    Comment: insurance  Tobacco Use   Smoking status: Former    Types: Cigarettes    Quit date: 07/15/1993    Years since quitting: 29.0   Smokeless tobacco: Never  Substance and Sexual Activity   Alcohol use: No   Drug use: No   Sexual activity: Not on file  Other Topics Concern   Not on file  Social History Narrative   Not on file   Social Determinants of Health   Financial Resource Strain: Not on file  Food Insecurity: No Food Insecurity (07/04/2022)   Hunger Vital Sign    Worried About Running Out of Food in the Last Year: Never true    [place] in the Last Year: Never true  Transportation Needs: No Transportation Needs (07/04/2022)   PRAPARE - Hydrologist (Medical): No    Lack of Transportation (Non-Medical): No  Physical Activity: Not on file  Stress: Not on file  Social Connections: Not on file     Family History: The patient's family history includes Heart attack in her father and sister; Heart disease in her father and mother; Stroke in her mother.  ROS:   Please see the history of present illness.     All other systems reviewed and are negative.  EKGs/Labs/Other Studies Reviewed:    The following studies were reviewed today:   EKG:  EKG is  ordered today.  It shows normal sinus rhythm with sinus arrhythmia and pre-existing right bundle branch block (QRS 138 ms), once again the QTc is mildly prolonged at 484 ms   Recent Labs: 05/06/2022: TSH 4.06 07/04/2022: B Natriuretic Peptide 201.2; Magnesium 1.9 07/10/2022: ALT 26 07/16/2022: BUN 16; Creatinine, Ser 0.74; Hemoglobin 15.1; Platelets 222;  Potassium 4.4; Sodium 139  Recent Lipid Panel    Component Value Date/Time   CHOL 149 05/06/2022 0000   TRIG 174 (H) 05/06/2022 0000   HDL 41 (L) 05/06/2022 0000   CHOLHDL 3.6 05/06/2022 0000   VLDL 44 (H) 01/31/2017 1010   LDLCALC 81 05/06/2022 0000     Risk Assessment/Calculations:    CHA2DS2-VASc Score = 5   This indicates a 7.2% annual risk of stroke. The patient's score is based upon: CHF History: 0 HTN History: 1 Diabetes History: 1 Stroke History: 0 Vascular Disease History: 0 Age Score: 2 Gender Score: 1               Physical Exam:    VS:  BP 122/64   Pulse 71   Ht '5\' 3"'$  (1.6 m)   Wt 144 lb 3.2 oz (65.4 kg)   SpO2 97%   BMI 25.54 kg/m     Wt Readings from Last 3 Encounters:  08/09/22 144 lb 3.2 oz (65.4 kg)  07/19/22 160 lb 0.9 oz (72.6 kg)  07/10/22 154 lb (69.9  kg)      General: Alert, oriented x3, no distress, appears well.  Appears younger than stated age Head: no evidence of trauma, PERRL, EOMI, no exophtalmos or lid lag, no myxedema, no xanthelasma; normal ears, nose and oropharynx Neck: normal jugular venous pulsations and no hepatojugular reflux; brisk carotid pulses without delay and no carotid bruits Chest: clear to auscultation, no signs of consolidation by percussion or palpation, normal fremitus, symmetrical and full respiratory excursions Cardiovascular: normal position and quality of the apical impulse, regular rhythm, normal first and second heart sounds, no murmurs, rubs or gallops Abdomen: no tenderness or distention, no masses by palpation, no abnormal pulsatility or arterial bruits, normal bowel sounds, no hepatosplenomegaly Extremities: no clubbing, cyanosis or edema; 2+ radial, ulnar and brachial pulses bilaterally; 2+ right femoral, posterior tibial and dorsalis pedis pulses; 2+ left femoral, posterior tibial and dorsalis pedis pulses; no subclavian or femoral bruits Neurological: grossly nonfocal Psych: Normal mood and  affect   ASSESSMENT:    1. Persistent atrial fibrillation ([place])   2. Encounter for monitoring amiodarone therapy   3. Acquired thrombophilia ([place])   4. Type 2 diabetes mellitus without complication, without long-term current use of insulin ([place])   5. Essential hypertension   6. Hypercholesterolemia    PLAN:    In order of problems listed above:  AFib: The arrhythmia is symptomatic even when she has good ventricular rate control.  Had early recurrence of atrial fibrillation without antiarrhythmic send developed profound symptomatic junctional bradycardia on flecainide.  QTc is too long for dofetilide.  She seems to be doing well currently on amiodarone antiarrhythmic therapy, without taking any other rate control medications.  So far no recurrence, 3 weeks since last cardioversion.  She is compliant with anticoagulation.  Reduce amiodarone to 200 mg daily.  She is almost done with a ZIO monitor, should get results next week.  Hopefully will not require pacemaker implantation for tachycardia-bradycardia syndrome. Amiodarone: Discussed the multiple potential toxicities of this medication, the need for monitoring of liver and thyroid function, avoidance of sun exposure, yearly eye exam, need to promptly report unexplained respiratory symptoms, drug interactions, etc. Anticoagulation: Compliant with anticoagulation, no bleeding problems. DM: Most recent hemoglobin A1c 6.8% without medication.  Started on metformin and is having a few GI side effects, but so far tolerable.  Inquired about other medication options.  Discussed SGLT2 inhibitors and GLP-1 agonists, but cost is a drawback.  His recent creatinine was normal at 0.74. HTN: Well-controlled HLP: In the absence of CAD or PAD her current lipid profile on atorvastatin is acceptable.  Target LDL less than 100 due to diabetes mellitus.           Medication Adjustments/Labs and Tests Ordered: Current medicines are reviewed at length with the  patient today.  Concerns regarding medicines are outlined above.  Orders Placed This Encounter  Procedures   Comprehensive metabolic panel   TSH   EKG 12-Lead   Meds ordered this encounter  Medications   amiodarone (PACERONE) 200 MG tablet    Sig: Take 1 tablet (200 mg total) by mouth daily.    Dispense:  90 tablet    Refill:  3    Patient Instructions  Medication Instructions:  Amiodarone '200mg'$  daily *If you need a refill on your cardiac medications before your next appointment, please call your pharmacy*   Lab Work: June- CMET and TSH If you have labs (blood work) drawn today and your tests are completely normal, you will receive your  results only by: MyChart Message (if you have MyChart) OR A paper copy in the mail If you have any lab test that is abnormal or we need to change your treatment, we will call you to review the results.   Follow-Up: At [facility], you and your health needs are our priority.  As part of our continuing mission to provide you with exceptional heart care, we have created designated Provider Care Teams.  These Care Teams include your primary Cardiologist (physician) and Advanced Practice Providers (APPs -  Physician Assistants and Nurse Practitioners) who all work together to provide you with the care you need, when you need it.  We recommend signing up for the patient portal called "MyChart".  Sign up information is provided on this After Visit Summary.  MyChart is used to connect with patients for Virtual Visits (Telemedicine).  Patients are able to view lab/test results, encounter notes, upcoming appointments, etc.  Non-urgent messages can be sent to your provider as well.   To learn more about what you can do with MyChart, go to [email or web address].    Your next appointment:   6 month(s)  Provider:   Dr Sallyanne Kuster    Signed, Sanda Klein, MD  08/09/2022 8:06 PM    [place]

## 2022-08-09 NOTE — Patient Instructions (Signed)
Medication Instructions:  Amiodarone '200mg'$  daily *If you need a refill on your cardiac medications before your next appointment, please call your pharmacy*   Lab Work: June- CMET and TSH If you have labs (blood work) drawn today and your tests are completely normal, you will receive your results only by: [place] (if you have MyChart) OR A paper copy in the mail If you have any lab test that is abnormal or we need to change your treatment, we will call you to review the results.   Follow-Up: At [facility], you and your health needs are our priority.  As part of our continuing mission to provide you with exceptional heart care, we have created designated Provider Care Teams.  These Care Teams include your primary Cardiologist (physician) and Advanced Practice Providers (APPs -  Physician Assistants and Nurse Practitioners) who all work together to provide you with the care you need, when you need it.  We recommend signing up for the patient portal called "MyChart".  Sign up information is provided on this After Visit Summary.  MyChart is used to connect with patients for Virtual Visits (Telemedicine).  Patients are able to view lab/test results, encounter notes, upcoming appointments, etc.  Non-urgent messages can be sent to your provider as well.   To learn more about what you can do with MyChart, go to [email or web address].    Your next appointment:   6 month(s)  Provider:   Dr Sallyanne Kuster

## 2022-08-12 ENCOUNTER — Encounter: Payer: Self-pay | Admitting: Nurse Practitioner

## 2022-08-12 ENCOUNTER — Ambulatory Visit (INDEPENDENT_AMBULATORY_CARE_PROVIDER_SITE_OTHER): Payer: PPO | Admitting: Nurse Practitioner

## 2022-08-12 VITALS — BP 136/72 | HR 68 | Temp 97.2°F | Ht 63.0 in | Wt 144.0 lb

## 2022-08-12 DIAGNOSIS — I4819 Other persistent atrial fibrillation: Secondary | ICD-10-CM | POA: Diagnosis not present

## 2022-08-12 DIAGNOSIS — I10 Essential (primary) hypertension: Secondary | ICD-10-CM

## 2022-08-12 DIAGNOSIS — J449 Chronic obstructive pulmonary disease, unspecified: Secondary | ICD-10-CM | POA: Diagnosis not present

## 2022-08-12 DIAGNOSIS — E119 Type 2 diabetes mellitus without complications: Secondary | ICD-10-CM | POA: Diagnosis not present

## 2022-08-12 DIAGNOSIS — E1122 Type 2 diabetes mellitus with diabetic chronic kidney disease: Secondary | ICD-10-CM | POA: Diagnosis not present

## 2022-08-12 DIAGNOSIS — E663 Overweight: Secondary | ICD-10-CM

## 2022-08-12 DIAGNOSIS — Z0001 Encounter for general adult medical examination with abnormal findings: Secondary | ICD-10-CM | POA: Diagnosis not present

## 2022-08-12 DIAGNOSIS — R6889 Other general symptoms and signs: Secondary | ICD-10-CM | POA: Diagnosis not present

## 2022-08-12 DIAGNOSIS — Z0000 Encounter for general adult medical examination without abnormal findings: Secondary | ICD-10-CM

## 2022-08-12 DIAGNOSIS — K219 Gastro-esophageal reflux disease without esophagitis: Secondary | ICD-10-CM | POA: Diagnosis not present

## 2022-08-12 DIAGNOSIS — E785 Hyperlipidemia, unspecified: Secondary | ICD-10-CM

## 2022-08-12 DIAGNOSIS — E1169 Type 2 diabetes mellitus with other specified complication: Secondary | ICD-10-CM | POA: Diagnosis not present

## 2022-08-12 DIAGNOSIS — I700 Atherosclerosis of aorta: Secondary | ICD-10-CM

## 2022-08-12 DIAGNOSIS — N182 Chronic kidney disease, stage 2 (mild): Secondary | ICD-10-CM

## 2022-08-12 DIAGNOSIS — Z79899 Other long term (current) drug therapy: Secondary | ICD-10-CM | POA: Diagnosis not present

## 2022-08-12 DIAGNOSIS — E559 Vitamin D deficiency, unspecified: Secondary | ICD-10-CM

## 2022-08-12 DIAGNOSIS — F4321 Adjustment disorder with depressed mood: Secondary | ICD-10-CM

## 2022-08-12 NOTE — Progress Notes (Signed)
MEDICARE ANNUAL WELLNESS VISIT AND 3  MONTH FOLLOW UP  Assessment:   Diagnoses and all orders for this visit:  Annual Medicare Wellness Visit Due annually  Health maintenance reviewed  Atherosclerosis of aorta ([place]) - per CXR 05/12/2017 Continue Atorvastatin Control blood pressure, cholesterol, glucose, increase exercise.   Essential hypertension Continue Amiodarone, Amlodipine, Olmesartan,  Discussed DASH (Dietary Approaches to Stop Hypertension) DASH diet is lower in sodium than a typical American diet. Cut back on foods that are high in saturated fat, cholesterol, and trans fats. Eat more whole-grain foods, fish, poultry, and nuts Remain active and exercise as tolerated daily.  Monitor BP at home-Call if greater than 130/80.   Hyperlipidemia associated with type 2 diabetes mellitus (HCC) Aortic atherosclerosis Continue Atorvastatin Discussed lifestyle modifications. Recommended diet heavy in fruits and veggies, omega 3's. Decrease consumption of animal meats, cheeses, and dairy products. Remain active and exercise as tolerated. Continue to monitor.  Type 2 diabetes mellitus with stage 2 chronic kidney disease, without long-term current use of insulin (HCC) Continue Metformin Controlled with diet and exercise Discussed general issues about diabetes pathophysiology and management. Education: Reviewed 'ABCs' of diabetes management (respective goals in parentheses):  A1C (<7), blood pressure (<130/80), and cholesterol (LDL <70) Dietary recommendations Encouraged aerobic exercise.  Discussed foot care, check daily Yearly retinal exam Dental exam every 6 months Continue to monitor  Vitamin D deficiency Continue supplementation Check and monitor levels  Gastroesophageal reflux disease without esophagitis Continue Omeprazole PRN. No suspected reflux complications ([place]/stricture). Lifestyle modification:  wt loss, avoid meals 2-3h before bedtime. Consider eliminating  food triggers:  chocolate, caffeine, EtOH, acid/spicy food.  CKD stage 2 due to type 2 diabetes mellitus (HCC) Continue ARB, statin Discussed how what you eat and drink can aide in kidney protection. Stay well hydrated. Avoid high salt foods. Avoid NSAIDS. Keep BP and BG well controlled.   Take medications as prescribed. Remain active and exercise as tolerated daily. Maintain weight.  Continue to monitor.  Chronic obstructive pulmonary disease, unspecified COPD type ([place]) Former smoker, COPD per imaging Controlled Continue to monitor   Overweight (BMI 25.0-29.9) Discussed appropriate BMI Diet modification. Physical activity. Encouraged/praised to build confidence.  Medication management All medications discussed and reviewed in full. All questions and concerns regarding medications addressed.    Situational stress/depression Continue wellbutrin daily for mood/energy Stress management techniques discussed, increase water, good sleep hygiene discussed, increase exercise, and increase veggies.   Atrial fibrillation Currently in NSR Cardiology following Continue Amiodarone, Apixaban ZIO patch currently in place  No lab work to completed today - recently completed 07/18/22 - reviewed and stable.    Notify office for further evaluation and treatment, questions or concerns if any reported s/s fail to improve.   The patient was advised to call back or seek an in-person evaluation if any symptoms worsen or if the condition fails to improve as anticipated.   Further disposition pending results of labs. Discussed med's effects and SE's.    I discussed the assessment and treatment plan with the patient. The patient was provided an opportunity to ask questions and all were answered. The patient agreed with the plan and demonstrated an understanding of the instructions.  Discussed med's effects and SE's. Screening labs and tests as requested with regular follow-up as  recommended.  I provided 40 minutes of face-to-face time during this encounter including counseling, chart review, and critical decision making was preformed.    Future Appointments  Date Time Provider [place]  11/18/2022  3:30  PM Unk Pinto, MD [facility] None  05/19/2023  2:00 PM Unk Pinto, MD [facility] None     Plan:   During the course of the visit the patient was educated and counseled about appropriate screening and preventive services including:   Pneumococcal vaccine  Prevnar 13 Influenza vaccine Td vaccine Screening electrocardiogram Bone densitometry screening Colorectal cancer screening Diabetes screening Glaucoma screening Nutrition counseling  Advanced directives: requested   Subjective:  Debra Collins is a 87 y.o. female who presents for Medicare Annual Wellness Visit and 3 month follow up. She has Essential hypertension; Vitamin D deficiency; Type 2 diabetes mellitus ([place]); Chronic obstructive pulmonary disease ([place]); Overweight (BMI 25.0-29.9); Gastroesophageal reflux disease without esophagitis; Unsteady gait; CKD stage 2 due to type 2 diabetes mellitus ([place]); Hyperlipidemia; Aortic atherosclerosis ([place]) by CXR 05/12/2017; Paresthesia and pain of both upper extremities; Situational depression; Persistent atrial fibrillation (HCC); and Symptomatic bradycardia on their problem list.   She continues to care for her husband with dementia.  Has recently started [facility] care, leaving him for 3-4 days a week to be cared for by their services.  This allows for her to focus more on herself.  She is hoping this helps to decrease her stress and anxiety.    She had a ER visit on 07/04/22 for symptomatic bradycardia.  She had been referred to Dr. Phineas Inches on 05/23/22 for atrial fibrillation.  She had an echo ordered and plan was for outpatient DCCV after 3 weeks of uninterrupted anticoagulation.  She underwent successful DCCV on 06/13/22 with  restoration of sinus rhythm.  She had to be cardioverted 3 times before restoration of sinus rhythm.  When she followed up with Dr. Sallyanne Kuster on 06/20/22 she was noted to be back in atrial fibrillation.  Medical therapy was felt to be the best option in the view of her age.  Plan was to obtain a Myoview and if no ischemia start Flecainide.  Myoview on 06/25/22 was low risk and showed no evidence of ischemia.  Echo on 06/26/22 showed LVEF of 60-65% with normal wall motion and no significant valvular disease.  Given negative Myoview she was started on Flecainide. She presented on 07/04/22 with weakness, ED called and she was noted to be markedly bradycardic with rates in the high 20s.  She was started on Amiodarone 200 mg BID.  She was then scheduled for an outpatient cardioversion on 07/19/22.  She followed up with Dr. Sallyanne Kuster for cardioversion with a dx of AFIB.  She was cardioverted 1 time and EKG showed NSR.  She is wearing a ZIO patch heart monitor today and will complete for one more week.  She continues to be in NSR today.     She has remote hx of bil mastectomy in 1985 for severe fibrocystic breast disease.   She uses meloxicam/voltaren gel for joint pains, well controlled.   GERD well controlled with omeprazole after failure of famotidine.   BMI is Body mass index is 25.51 kg/m., she has not been working on diet and exercise, but generally active around home/garden and full time caregiver for her husband.  Wt Readings from Last 3 Encounters:  08/12/22 144 lb (65.3 kg)  08/09/22 144 lb 3.2 oz (65.4 kg)  07/19/22 160 lb 0.9 oz (72.6 kg)   Her blood pressure has been controlled at home, today their BP is BP: 136/72   She does not workout. She denies chest pain, shortness of breath, dizziness.   She is a remote former smoker, COPD  per CXR in 2018. Denies cough/productivity, exertional sx. Reports occasional wheeze.   Aortic atherosclerosis per CXR 05/12/2017.    She is on cholesterol  medication (atorvastatin 80 mg taking half pill and denies myalgias. Her cholesterol is not at goal. The cholesterol last visit was:    Lab Results  Component Value Date   CHOL 149 05/06/2022   HDL 41 (L) 05/06/2022   LDLCALC 81 05/06/2022   TRIG 174 (H) 05/06/2022   CHOLHDL 3.6 05/06/2022    She has been working on diet and exercise for diabetes with CKD (controlled in prediabetes range with meds, didn't like metformin made her feel, just recently increased trulicity to 4.5 mg weekly and tolerating well), she is on bASA, she is on ACE, and denies paresthesia of the feet, polydipsia and polyuria. Last A1C in the office was:  Lab Results  Component Value Date   HGBA1C 6.8 (H) 05/06/2022   Lab Results  Component Value Date   EGFR 80 07/16/2022   EGFR 82 07/10/2022   EGFR 68 06/10/2022   Lab Results  Component Value Date   MICRALBCREAT 14 05/06/2022   She did change her levothyroxine dose, taking 1 tab 50 mcg daily.  Lab Results  Component Value Date   TSH 4.06 05/06/2022   Patient is on Vitamin D supplement - 5000 units daily, but forgets to take   Lab Results  Component Value Date   VD25OH 69 05/06/2022       Medication Review:  Current Outpatient Medications on File Prior to Visit  Medication Sig Dispense Refill   acetaminophen (TYLENOL) 500 MG tablet Take 1,000 mg by mouth every 6 (six) hours as needed for moderate pain.     amiodarone (PACERONE) 200 MG tablet Take 1 tablet (200 mg total) by mouth daily. 90 tablet 3   amLODipine (NORVASC) 2.5 MG tablet Take 1 tablet (2.5 mg total) by mouth daily. 30 tablet 2   apixaban (ELIQUIS) 5 MG TABS tablet Take 1 tablet (5 mg total) by mouth 2 (two) times daily. 60 tablet 5   atorvastatin (LIPITOR) 40 MG tablet Take 1 tablet daily as directed for cholesterol. (Patient taking differently: Take 40 mg by mouth every evening.) 90 tablet 3   Brimonidine Tartrate (LUMIFY) 0.025 % SOLN Place 1 drop into both eyes 2 (two) times daily as  needed (eye redness/irritation.).     buPROPion (WELLBUTRIN XL) 150 MG 24 hr tablet Take 150 mg by mouth in the morning.     Cholecalciferol (DIALYVITE VITAMIN D 5000) 125 MCG (5000 UT) capsule Take 5,000 Units by mouth daily.     metFORMIN (GLUCOPHAGE-XR) 500 MG 24 hr tablet Take 1 tablet (500 mg total) by mouth 2 (two) times daily with a meal. 180 tablet 6   Multiple Vitamin (MULTIVITAMIN WITH MINERALS) TABS tablet Take 1 tablet by mouth in the morning.     olmesartan (BENICAR) 40 MG tablet Take  1 tablet  Daily for BP                                                                     /  TAKE                                  BY                               MOUTH (Patient taking differently: Take 40 mg by mouth daily.) 90 tablet 3   omeprazole (PRILOSEC) 20 MG capsule Take  1 capsule  Daily  to Prevent Heartburn & Indigestion                                    /                                TAKE                          BY                       MOUTH (Patient taking differently: Take 20 mg by mouth daily.) 90 capsule 3   vitamin B-12 (CYANOCOBALAMIN) 1000 MCG tablet Take 1,000 mcg by mouth daily.     No current facility-administered medications on file prior to visit.    Current Problems (verified) Patient Active Problem List   Diagnosis Date Noted   Symptomatic bradycardia 07/04/2022   Persistent atrial fibrillation ([place]) 06/13/2022   Situational depression 10/12/2021   Paresthesia and pain of both upper extremities 01/30/2021   Aortic atherosclerosis ([place]) by CXR 05/12/2017 07/11/2020   Hyperlipidemia 06/22/2019   CKD stage 2 due to type 2 diabetes mellitus ([place]) 03/15/2019   Unsteady gait 03/12/2018   Gastroesophageal reflux disease without esophagitis 03/11/2018   Overweight (BMI 25.0-29.9) 08/11/2017   Chronic obstructive pulmonary disease ([place]) 01/29/2017   Type 2 diabetes mellitus ([place]) 08/16/2014   Essential hypertension 07/25/2013   Vitamin D  deficiency 07/25/2013    Screening Tests Immunization History  Administered Date(s) Administered   H1N1 06/28/2008   Influenza Whole 05/04/2013   Influenza, High Dose Seasonal PF 04/26/2014, 04/26/2015, 04/01/2016, 05/07/2017, 04/07/2019, 04/26/2020, 04/14/2022   Influenza-Unspecified 04/14/2018, 04/11/2021   PFIZER(Purple Top)SARS-COV-2 Vaccination 07/28/2019, 08/28/2019, 04/16/2020   Pfizer Covid-19 Vaccine Bivalent Booster 16yr & up 04/04/2021, 04/14/2022   Pneumococcal Conjugate-13 04/26/2014   Pneumococcal Polysaccharide-23 07/10/2011   Rsv, Bivalent, Protein Subunit Rsvpref,pf ([place] 04/14/2022   Td 07/10/2011   Zoster, Live 06/28/2008   Last colonoscopy: 2005 declines another Last mammogram: bilateral mastectomy in 1985 due to severe fibrocystic breast disease, had implants which were also removed.  Updated 11/2021 - NO SPECIFIC MAMMOGRAPHIC EVIDENCE OF MALIGNANCY.  NEXT SCREENING MAMMOGRAM IS RECOMMENDED IN ONE  YEAR. Due 01/2023 DEXA: 01/2016 - normal - Due UKoreatransvaginal 08/2016  Prior vaccinations: TD or Tdap: 2012 - declines, want to get PRN  Influenza: 04/2022 Pneumococcal: 2012 Prevnar13: 2015 Shingles/Zostavax: 2009  Names of Other Physician/Practitioners you currently use: 1. [facility] here for primary care 2. Dr. SGershon Crane eye doctor, last visit 02/2022 no retinopathy  3., Dr. JMelony Overlydentist, last visit 2023, goes [id number] Care Team: McUnk PintoMD as PCP - General (Internal Medicine) GoMelissa NoonOD (Optometry)  Allergies No Known Allergies   SURGICAL HISTORY She  has a past surgical history that includes Knee arthroscopy (Left); Appendectomy; Mastectomy (Bilateral, 1985); Cataract extraction, bilateral (Bilateral, 09/2017); Cardioversion (N/A, 06/13/2022); TEMPORARY PACEMAKER (N/A, 07/04/2022); and Cardioversion (N/A, 07/19/2022). FAMILY HISTORY Her family history includes Heart attack in her father and  sister; Heart disease in her father and mother; Stroke in her mother. SOCIAL HISTORY She  reports that she quit smoking about 29 years ago. Her smoking use included cigarettes. She has never used smokeless tobacco. She reports that she does not drink alcohol and does not use drugs.  MEDICARE WELLNESS OBJECTIVES: Physical activity:   Cardiac risk factors:   Depression/mood screen:      08/12/2022    1:22 PM  Depression screen PHQ 2/9  Decreased Interest 0  Down, Depressed, Hopeless 0  PHQ - 2 Score 0    ADLs:     08/12/2022   10:46 AM 07/06/2022    2:24 PM  In your present state of health, do you have any difficulty performing the following activities:  Hearing? 0   Vision? 0   Difficulty concentrating or making decisions? 1   Walking or climbing stairs? 1   Dressing or bathing? 0   Doing errands, shopping? 0 1  Preparing Food and eating ? N   Using the Toilet? N   In the past six months, have you accidently leaked urine? N   Do you have problems with loss of bowel control? N   Managing your Medications? N   Managing your Finances? N   Housekeeping or managing your Housekeeping? N      Cognitive Testing  Alert? Yes  Normal Appearance?Yes  Oriented to person? Yes  Place? Yes   Time? Yes  Recall of three objects?  Yes  Can perform simple calculations? Yes  Displays appropriate judgment?Yes  Can read the correct time from a watch face?Yes  EOL planning: Does Patient Have a Medical Advance Directive?: Yes   Review of Systems  Constitutional:  Negative for chills, fever, malaise/fatigue and weight loss.  HENT:  Negative for congestion, ear pain, hearing loss, sore throat and tinnitus.   Eyes: Negative.  Negative for blurred vision and double vision.  Respiratory:  Negative for cough, sputum production, shortness of breath and wheezing.   Cardiovascular:  Negative for chest pain, palpitations, orthopnea, claudication, leg swelling and PND.  Gastrointestinal:  Negative  for abdominal pain, blood in stool, constipation, diarrhea, heartburn, melena, nausea and vomiting.  Genitourinary: Negative.   Musculoskeletal:  Negative for falls, joint pain and myalgias.  Skin: Negative.  Negative for rash.  Neurological:  Negative for dizziness, tingling, sensory change, loss of consciousness, weakness and headaches.  Endo/Heme/Allergies:  Negative for polydipsia.  Psychiatric/Behavioral:  Positive for depression. Negative for memory loss, substance abuse and suicidal ideas. The patient is not nervous/anxious and does not have insomnia.   All other systems reviewed and are negative.    Objective:     Today's Vitals   08/12/22 1025  BP: 136/72  Pulse: 68  Temp: (!) 97.2 F (36.2 C)  SpO2: 98%  Weight: 144 lb (65.3 kg)  Height: '5\' 3"'$  (1.6 m)   Body mass index is 25.51 kg/m.  General appearance: alert, no distress, WD/WN, female HEENT: normocephalic, sclerae anicteric, TMs pearly, nares patent, no discharge or erythema, pharynx normal Oral cavity: MMM, no lesions Neck: supple, no lymphadenopathy, no thyromegaly, no masses Heart: RRR, normal S1, S2, no murmurs ZIO patch Lungs: CTA bilaterally, no wheezes, rhonchi, or rales Abdomen: +bs, soft, non tender,  non distended, no masses, no hepatomegaly, no splenomegaly Musculoskeletal: nontender, no swelling, no obvious deformity Extremities: no edema, no cyanosis, no clubbing Pulses: 2+ symmetric, upper and lower extremities, normal cap refill Neurological: alert, oriented x 3, CN2-12 intact, strength normal upper extremities, LLE mildly weak, sensation normal throughout, DTRs 2+ throughout, no cerebellar signs, gait slow Psychiatric: normal affect, behavior normal, pleasant  Breasts: S/p bil mastectomy, no palpable mass or axillary lymphadenopathy,   Medicare Attestation I have personally reviewed: The patient's medical and social history Their use of alcohol, tobacco or illicit drugs Their current medications  and supplements The patient's functional ability including ADLs,fall risks, home safety risks, cognitive, and hearing and visual impairment Diet and physical activities Evidence for depression or mood disorders  The patient's weight, height, BMI, and visual acuity have been recorded in the chart.  I have made referrals, counseling, and provided education to the patient based on review of the above and I have provided the patient with a written personalized care plan for preventive services.     Darrol Jump, NP   08/12/2022

## 2022-08-14 ENCOUNTER — Telehealth: Payer: Self-pay | Admitting: Cardiovascular Disease

## 2022-08-14 NOTE — Telephone Encounter (Signed)
Spoke with patient about prescriptions. She picked up diltiazem from pharmacy which was d/c'ed in December. This could have been on auto-refill. Advised that amiodarone was sent to pharmacy on 1/26 and she should be able to get this filled. She does not need Rx for amlodpine right now.

## 2022-08-14 NOTE — Telephone Encounter (Signed)
Pt c/o medication issue:  1. Name of Medication: diltiazem '240mg'$   2. How are you currently taking this medication (dosage and times per day)? Not taking  3. Are you having a reaction (difficulty breathing--STAT)? no  4. What is your medication issue? Patient states a new prescription was supposed to be sent for amlodipine and amiodarone, but she also received diltiazem. She says the medication is not listed in her med list and she called Dr. Idell Pickles office who said they did not prescribe it.

## 2022-08-23 ENCOUNTER — Ambulatory Visit: Payer: PPO | Admitting: Internal Medicine

## 2022-09-17 ENCOUNTER — Other Ambulatory Visit: Payer: Self-pay | Admitting: Nurse Practitioner

## 2022-09-17 MED ORDER — BUPROPION HCL ER (XL) 150 MG PO TB24: 150.0000 mg | ORAL_TABLET | Freq: Every morning | ORAL | 0 refills | Status: DC

## 2022-09-23 ENCOUNTER — Encounter: Payer: Self-pay | Admitting: Nurse Practitioner

## 2022-09-23 ENCOUNTER — Ambulatory Visit (INDEPENDENT_AMBULATORY_CARE_PROVIDER_SITE_OTHER): Payer: HMO | Admitting: Nurse Practitioner

## 2022-09-23 VITALS — BP 132/62 | HR 87 | Temp 97.9°F | Ht 63.0 in | Wt 138.0 lb

## 2022-09-23 DIAGNOSIS — M79674 Pain in right toe(s): Secondary | ICD-10-CM

## 2022-09-23 DIAGNOSIS — M109 Gout, unspecified: Secondary | ICD-10-CM | POA: Diagnosis not present

## 2022-09-23 DIAGNOSIS — I10 Essential (primary) hypertension: Secondary | ICD-10-CM | POA: Diagnosis not present

## 2022-09-23 LAB — URIC ACID: Uric Acid, Serum: 4.7 mg/dL (ref 2.5–7.0)

## 2022-09-23 MED ORDER — ALLOPURINOL 100 MG PO TABS: 100.0000 mg | ORAL_TABLET | Freq: Every day | ORAL | 3 refills | Status: AC

## 2022-09-23 MED ORDER — PREDNISONE 20 MG PO TABS: ORAL_TABLET | ORAL | 0 refills | Status: AC

## 2022-09-23 NOTE — Progress Notes (Signed)
Assessment and Plan:  Debra Collins was seen today for toe pain.  Diagnoses and all orders for this visit:  Essential hypertension - continue medications, DASH diet, exercise and monitor at home. Call if greater than 130/80.   Great toe pain, right/ Gout of big toe If no improvement in the next 72 hours notify the office -     Uric acid -     predniSONE (DELTASONE) 20 MG tablet; 3 tablets daily with food for 2 days, 2 tabs daily for 2 days, 1 tab a day for 3 days. -     allopurinol (ZYLOPRIM) 100 MG tablet; Take 1 tablet (100 mg total) by mouth daily.         Further disposition pending results of labs. Discussed med's effects and SE's.   Over 30 minutes of exam, counseling, chart review, and critical decision making was performed.   Future Appointments  Date Time Provider [place]  11/18/2022  3:30 PM Unk Pinto, MD [facility] None  05/19/2023  2:00 PM Unk Pinto, MD [facility] None    ------------------------------------------------------------------------------------------------------------------   HPI BP 132/62   Pulse 87   Temp 97.9 F (36.6 C)   Ht '5\' 3"'$  (1.6 m)   Wt 138 lb (62.6 kg)   SpO2 94%   BMI 24.45 kg/m   86 y.o.female presents for right foot great toe pain x 3 days, area is red and swollen.  Very difficult to walk, very tender.   BP is currently well controlled with norvasc 2.5 mg QD, amiodarone 200 mg QD,  and Olmesartan 40 mg QD.  Denies headaches, chest pain, shortness of breath and dizziness.  BP Readings from Last 3 Encounters:  09/23/22 132/62  08/12/22 136/72  08/09/22 122/64    BMI is Body mass index is 24.45 kg/m., she has not been working on diet and exercise. She states she forgets to eat taking care of her husband. Wt Readings from Last 3 Encounters:  09/23/22 138 lb (62.6 kg)  08/12/22 144 lb (65.3 kg)  08/09/22 144 lb 3.2 oz (65.4 kg)     Past Medical History:  Diagnosis Date   DDD (degenerative disc disease),  lumbar    Fatty liver disease, nonalcoholic    CT AB 0000000   Fibrocystic breast disease    Hyperlipidemia    Hypertension    Type II or unspecified type diabetes mellitus without mention of complication, not stated as uncontrolled    Vitamin D deficiency      No Known Allergies  Current Outpatient Medications on File Prior to Visit  Medication Sig   acetaminophen (TYLENOL) 500 MG tablet Take 1,000 mg by mouth every 6 (six) hours as needed for moderate pain.   amiodarone (PACERONE) 200 MG tablet Take 1 tablet (200 mg total) by mouth daily.   amLODipine (NORVASC) 2.5 MG tablet Take 1 tablet (2.5 mg total) by mouth daily.   apixaban (ELIQUIS) 5 MG TABS tablet Take 1 tablet (5 mg total) by mouth 2 (two) times daily.   atorvastatin (LIPITOR) 40 MG tablet Take 1 tablet daily as directed for cholesterol. (Patient taking differently: Take 40 mg by mouth every evening.)   Brimonidine Tartrate (LUMIFY) 0.025 % SOLN Place 1 drop into both eyes 2 (two) times daily as needed (eye redness/irritation.).   buPROPion (WELLBUTRIN XL) 150 MG 24 hr tablet Take 1 tablet (150 mg total) by mouth in the morning.   Cholecalciferol (DIALYVITE VITAMIN D 5000) 125 MCG (5000 UT) capsule Take 5,000 Units by mouth  daily.   diltiazem (CARDIZEM CD) 240 MG 24 hr capsule Take 240 mg by mouth daily.   metFORMIN (GLUCOPHAGE-XR) 500 MG 24 hr tablet Take 1 tablet (500 mg total) by mouth 2 (two) times daily with a meal.   Multiple Vitamin (MULTIVITAMIN WITH MINERALS) TABS tablet Take 1 tablet by mouth in the morning.   olmesartan (BENICAR) 40 MG tablet Take  1 tablet  Daily for BP                                                                     /                                   TAKE                                  BY                               MOUTH (Patient taking differently: Take 40 mg by mouth daily.)   omeprazole (PRILOSEC) 20 MG capsule Take  1 capsule  Daily  to Prevent Heartburn & Indigestion                                     /                                TAKE                          BY                       MOUTH (Patient taking differently: Take 20 mg by mouth daily.)   vitamin B-12 (CYANOCOBALAMIN) 1000 MCG tablet Take 1,000 mcg by mouth daily.   No current facility-administered medications on file prior to visit.    ROS: all negative except above.   Physical Exam:  BP 132/62   Pulse 87   Temp 97.9 F (36.6 C)   Ht '5\' 3"'$  (1.6 m)   Wt 138 lb (62.6 kg)   SpO2 94%   BMI 24.45 kg/m   General Appearance: Very pleasant thin female in mild distress Eyes: PERRLA, EOMs, conjunctiva no swelling or erythema Neck: Supple, thyroid normal.  Respiratory: Respiratory effort normal, BS equal bilaterally without rales, rhonchi, wheezing or stridor.  Cardio: RRR with no MRGs. Brisk peripheral pulses without edema.  Lymphatics: Non tender without lymphadenopathy.  Musculoskeletal: Full ROM, 5/5 strength. Very antalgic gait. Right great red, warm swollen and very tender to touch Skin: Warm, dry without rashes, lesions, ecchymosis.  Neuro: Cranial nerves intact. Normal muscle tone, no cerebellar symptoms. Sensation intact.  Psych: Awake and oriented X 3, normal affect, Insight and Judgment appropriate.     Alycia Rossetti, NP 3:43 PM [facility]

## 2022-09-23 NOTE — Patient Instructions (Signed)
Gout  Gout is painful swelling of your joints. Gout is a type of arthritis. It is caused by having too much uric acid in your body. Uric acid is a chemical that is made when your body breaks down substances called purines. If your body has too much uric acid, sharp crystals can form and build up in your joints. This causes pain and swelling. Gout attacks can happen quickly and be very painful (acute gout). Over time, the attacks can affect more joints and happen more often (chronic gout). What are the causes? Gout is caused by too much uric acid in your blood. This can happen because: Your kidneys do not remove enough uric acid from your blood. Your body makes too much uric acid. You eat too many foods that are high in purines. These foods include organ meats, some seafood, and beer. Trauma or stress can bring on an attack. What increases the risk? Having a family history of gout. Being female and middle-aged. Being female and having gone through menopause. Having an organ transplant. Taking certain medicines. Having certain conditions, such as: Being very overweight (obese). Lead poisoning. Kidney disease. A skin condition called psoriasis. Other risks include: Losing weight too quickly. Not having enough water in the body (being dehydrated). Drinking alcohol, especially beer. Drinking beverages that are sweetened with a type of sugar called fructose. What are the signs or symptoms? An attack of acute gout often starts at night and usually happens in just one joint. The most common place is the big toe. Other joints that may be affected include joints of the feet, ankle, knee, fingers, wrist, or elbow. Symptoms may include: Very bad pain. Warmth. Swelling. Stiffness. Tenderness. The affected joint may be very painful to touch. Shiny, red, or purple skin. Chills and fever. Chronic gout may cause symptoms more often. More joints may be involved. You may also have white or yellow lumps  (tophi) on your hands or feet or in other areas near your joints. How is this treated? Treatment for an acute attack may include medicines for pain and swelling, such as: NSAIDs, such as ibuprofen. Steroids taken by mouth or injected into a joint. Colchicine. This can be given by mouth or through an IV tube. Treatment to prevent future attacks may include: Taking small doses of NSAIDs or colchicine daily. Using a medicine that reduces uric acid levels in your blood, such as allopurinol. Making changes to your diet. You may need to see a food expert (dietitian) about what to eat and drink to prevent gout. Follow these instructions at home: During a gout attack  If told, put ice on the painful area. To do this: Put ice in a plastic bag. Place a towel between your skin and the bag. Leave the ice on for 20 minutes, 2-3 times a day. Take off the ice if your skin turns bright red. This is very important. If you cannot feel pain, heat, or cold, you have a greater risk of damage to the area. Raise the painful joint above the level of your heart as often as you can. Rest the joint as much as possible. If the joint is in your leg, you may be given crutches. Follow instructions from your doctor about what you cannot eat or drink. Avoiding future gout attacks Eat a low-purine diet. Avoid foods and drinks such as: Liver. Kidney. Anchovies. Asparagus. Herring. Mushrooms. Mussels. Beer. Stay at a healthy weight. If you want to lose weight, talk with your doctor. Do not   lose weight too fast. Start or continue an exercise plan as told by your doctor. Eating and drinking Avoid drinks sweetened by fructose. Drink enough fluids to keep your pee (urine) pale yellow. If you drink alcohol: Limit how much you have to: 0-1 drink a day for women who are not pregnant. 0-2 drinks a day for men. Know how much alcohol is in a drink. In the [place] one drink equals one 12 oz bottle of beer (355 mL), one 5 oz  glass of wine (148 mL), or one 1 oz glass of hard liquor (44 mL). General instructions Take over-the-counter and prescription medicines only as told by your doctor. Ask your doctor if you should avoid driving or using machines while you are taking your medicine. Return to your normal activities when your doctor says that it is safe. Keep all follow-up visits. Where to find more information National Institutes of Health: www.niams.nih.gov Contact a doctor if: You have another gout attack. You still have symptoms of a gout attack after 10 days of treatment. You have problems (side effects) because of your medicines. You have chills or a fever. You have burning pain when you pee (urinate). You have pain in your lower back or belly. Get help right away if: You have very bad pain. Your pain cannot be controlled. You cannot pee. Summary Gout is painful swelling of the joints. The most common site of pain is the big toe, but it can affect other joints. Medicines and avoiding some foods can help to prevent and treat gout attacks. This information is not intended to replace advice given to you by your health care provider. Make sure you discuss any questions you have with your health care provider. Document Revised: 04/04/2021 Document Reviewed: 04/04/2021 Elsevier Patient Education  2023 Elsevier Inc.  

## 2022-10-10 ENCOUNTER — Other Ambulatory Visit: Payer: Self-pay | Admitting: Student

## 2022-10-16 ENCOUNTER — Ambulatory Visit: Payer: HMO | Admitting: Nurse Practitioner

## 2022-11-14 ENCOUNTER — Ambulatory Visit: Payer: HMO | Admitting: Internal Medicine

## 2022-11-18 ENCOUNTER — Ambulatory Visit: Payer: HMO | Admitting: Internal Medicine

## 2022-11-26 ENCOUNTER — Ambulatory Visit: Payer: HMO | Admitting: Internal Medicine

## 2022-12-02 ENCOUNTER — Other Ambulatory Visit: Payer: Self-pay

## 2022-12-02 DIAGNOSIS — E1169 Type 2 diabetes mellitus with other specified complication: Secondary | ICD-10-CM

## 2022-12-02 MED ORDER — ATORVASTATIN CALCIUM 40 MG PO TABS: ORAL_TABLET | ORAL | 3 refills | Status: DC

## 2022-12-04 ENCOUNTER — Encounter: Payer: Self-pay | Admitting: Internal Medicine

## 2022-12-04 NOTE — Progress Notes (Signed)
Future Appointments  Date Time Provider Department  12/05/2022                     6 mo 10:30 AM Debra Cowboy, MD [facility]  05/19/2023                     cpe  2:00 PM Debra Cowboy, MD [facility]    History of Present Illness:       This very nice 87 y.o.  MWF presents for 3 month follow up with HTN, pAfib, HLD, Pre-Diabetes and Vitamin D Deficiency. CXR in 2018 showed Aortic Atherosclerosis & Emphysema.  In March 2023 , she was dx'd with Gout of the Rt great toe & started on Allopurinol.         Patient is treated for XBJ(4782)   & BP has been controlled at home. Today's BP is at goal - 128/64.  In Oct 2023 , she was discovered in Afib & has had CV x 2  and is followed by  Dr Royann Shivers.  Patient has had no complaints of any cardiac type chest pain, palpitations, dyspnea Debra Collins /PND, dizziness, claudication or dependent edema.        Hyperlipidemia is controlled with diet & meds. Patient denies myalgias or other med SE's. Last Lipids were at goal :  Lab Results  Component Value Date   CHOL 149 05/06/2022   HDL 41 (L) 05/06/2022   LDLCALC 81 05/06/2022   TRIG 174 (H) 05/06/2022   CHOLHDL 3.6 05/06/2022     Also, the patient has history of T2_NIDDM  (2009)  w/CKD2 (A1c 6.5% /2009 & 2012 and 6.4% /2016 ).  After a 20# weight loss in 2020, she was able to taper off of her Metformin. She  has had no symptoms of reactive hypoglycemia, diabetic polys, paresthesias or visual blurring.  Last A1c was not at goal :  Lab Results  Component Value Date   HGBA1C 6.8 (H) 05/06/2022        Further, the patient also has history of Vitamin D Deficiency  ("39"/2018)  and supplements vitamin D .   Last vitamin D was at goal :  Lab Results  Component Value Date   VD25OH 69 05/06/2022     Current Outpatient Medications on File Prior to Visit  Medication Sig   acetaminophen (TYLENOL) 500 MG tablet Take 1,000 mg by mouth every 6 (six) hours as needed for moderate pain.    allopurinol (ZYLOPRIM) 100 MG tablet Take 1 tablet (100 mg total) by mouth daily.   amiodarone (PACERONE) 200 MG tablet Take 1 tablet (200 mg total) by mouth daily.   amLODipine (NORVASC) 2.5 MG tablet Take 1 tablet (2.5 mg total) by mouth daily.   apixaban (ELIQUIS) 5 MG TABS tablet Take 1 tablet (5 mg total) by mouth 2 (two) times daily.   atorvastatin (LIPITOR) 40 MG tablet Take 1 tablet daily as directed for cholesterol.   Brimonidine Tartrate (LUMIFY) 0.025 % SOLN Place 1 drop into both eyes 2 (two) times daily as needed (eye redness/irritation.).   buPROPion (WELLBUTRIN XL) 150 MG 24 hr tablet Take 1 tablet (150 mg total) by mouth in the morning.   Cholecalciferol (DIALYVITE VITAMIN D 5000) 125 MCG (5000 UT) capsule Take 5,000 Units by mouth daily.   metFORMIN (GLUCOPHAGE-XR) 500 MG 24 hr tablet Take 1 tablet (500 mg total) by mouth 2 (two) times daily with a meal.   Multiple Vitamin (MULTIVITAMIN WITH  MINERALS) TABS tablet Take 1 tablet by mouth in the morning.   olmesartan (BENICAR) 40 MG tablet Take  1 tablet  Daily for BP                                                                     /                                   TAKE                                  BY                               MOUTH (Patient taking differently: Take 40 mg by mouth daily.)   omeprazole (PRILOSEC) 20 MG capsule Take  1 capsule  Daily  to Prevent Heartburn & Indigestion                                    /                                TAKE                          BY                       MOUTH (Patient taking differently: Take 20 mg by mouth daily.)   vitamin B-12 (CYANOCOBALAMIN) 1000 MCG tablet Take 1,000 mcg by mouth daily.   No current facility-administered medications on file prior to visit.     No Known Allergies   PMHx:   Past Medical History:  Diagnosis Date   DDD (degenerative disc disease), lumbar    Fatty liver disease, nonalcoholic    CT AB 2012   Fibrocystic breast disease     Hyperlipidemia    Hypertension    Type II or unspecified type diabetes mellitus     Vitamin D deficiency      Immunization History  Administered Date(s) Administered   H1N1 06/28/2008   Influenza Whole 05/04/2013   Influenza, High Dose   04/07/2019, 04/26/2020, 04/14/2022   Influenza 04/14/2018, 04/11/2021   PFIZER SARS-COV-2 Vacc 07/28/2019, 08/28/2019, 04/16/2020   Pfizer Covid-19 Vaccine  04/04/2021, 04/14/2022   Pneumococcal 13 04/26/2014   Pneumococcal -23 07/10/2011   Rsv, Bivalent (Abrysvo) 04/14/2022   Td 07/10/2011   Zoster, Live 06/28/2008     Past Surgical History:  Procedure Laterality Date   APPENDECTOMY     CARDIOVERSION N/A 06/13/2022   Procedure: CARDIOVERSION;  Surgeon: Jodelle Red, MD;  Location: [facility] ENDOSCOPY;  Service: Cardiovascular;  Laterality: N/A;   CARDIOVERSION N/A 07/19/2022   Procedure: CARDIOVERSION;  Surgeon: Thurmon Fair, MD;  Location: [facility] ENDOSCOPY;  Service: Cardiovascular;  Laterality: N/A;   CATARACT EXTRACTION, BILATERAL Bilateral 09/2017   Dr. Nile Riggs   KNEE ARTHROSCOPY Left  MASTECTOMY Bilateral 1985   Due to severe fibrocystic breast disease   TEMPORARY PACEMAKER N/A 07/04/2022   Procedure: TEMPORARY PACEMAKER;  Surgeon: Swazil[place], Peter M, MD;  Location: [facility] INVASIVE CV LAB;  Service: Cardiovascular;  Laterality: N/A;     FHx:    Reviewed / unchanged   SHx:    Reviewed / unchanged    Systems Review:  Constitutional: Denies fever, chills, wt changes, headaches, insomnia, fatigue, night sweats, change in appetite. Eyes: Denies redness, blurred vision, diplopia, discharge, itchy, watery eyes.  ENT: Denies discharge, congestion, post nasal drip, epistaxis, sore throat, earache, hearing loss, dental pain, tinnitus, vertigo, sinus pain, snoring.  CV: Denies chest pain, palpitations, irregular heartbeat, syncope, dyspnea, diaphoresis, orthopnea, PND, claudication or edema. Respiratory: denies cough, dyspnea, DOE, pleurisy,  hoarseness, laryngitis, wheezing.  Gastrointestinal: Denies dysphagia, odynophagia, heartburn, reflux, water brash, abdominal pain or cramps, nausea, vomiting, bloating, diarrhea, constipation, hematemesis, melena, hematochezia  or hemorrhoids. Genitourinary: Denies dysuria, frequency, urgency, nocturia, hesitancy, discharge, hematuria or flank pain. Musculoskeletal: Denies arthralgias, myalgias, stiffness, jt. swelling, pain, limping or strain/sprain.  Skin: Denies pruritus, rash, hives, warts, acne, eczema or change in skin lesion(s). Neuro: No weakness, tremor, incoordination, spasms, paresthesia or pain. Psychiatric: Denies confusion, memory loss or sensory loss. Endo: Denies change in weight, skin or hair change.  Heme/Lymph: No excessive bleeding, bruising or enlarged lymph nodes.   Physical Exam  BP 128/64   Pulse 77   Temp 97.7 F (36.5 C)   Resp 16   Ht 5\' 3"  (1.6 m)   Wt 138 lb (62.6 kg)   SpO2 96%   BMI 24.45 kg/m   Appears  well nourished, well groomed  and in no distress.  Eyes: PERRLA, EOMs, conjunctiva no swelling or erythema. Sinuses: No frontal/maxillary tenderness ENT/Mouth: EAC's clear, TM's nl w/o erythema, bulging. Nares clear w/o erythema, swelling, exudates. Oropharynx clear without erythema or exudates. Oral hygiene is good. Tongue normal, non obstructing. Hearing intact.  Neck: Supple. Thyroid not palpable. Car 2+/2+ without bruits, nodes or JVD. Chest: Respirations nl with BS clear & equal w/o rales, rhonchi, wheezing or stridor.  Cor: Heart sounds normal w/ regular rate and rhythm without sig. murmurs, gallops, clicks or rubs. Peripheral pulses normal and equal  without edema.  Abdomen: Soft & bowel sounds normal. Non-tender w/o guarding, rebound, hernias, masses or organomegaly.  Lymphatics: Unremarkable.  Musculoskeletal: Full ROM all peripheral extremities, joint stability, 5/5 strength and normal gait.  Skin: Warm, dry without exposed rashes, lesions  or ecchymosis apparent.  Neuro: Cranial nerves intact, reflexes equal bilaterally. Sensory-motor testing grossly intact. Tendon reflexes grossly intact.  Pysch: Alert & oriented x 3.  Insight and judgement nl & appropriate. No ideations.   Assessment and Plan:   1. Essential hypertension  - Continue medication, monitor blood pressure at home.  - Continue DASH diet.  Reminder to go to the ER if any CP,  SOB, nausea, dizziness, severe HA, changes vision/speech.    - CBC with Differential/Platelet - COMPLETE METABOLIC PANEL WITH GFR - Magnesium - TSH   2. Paroxysmal A-fib (HCC)  - TSH  3. Hyperlipidemia associated with type 2 diabetes mellitus (HCC)  - Continue diet/meds, exercise,& lifestyle modifications.  - Continue monitor periodic cholesterol/liver & renal functions     - Lipid panel - TSH   4. Type 2 diabetes mellitus with stage 2 chronic kidney  disease, without long-term current use of insulin (HCC)  - Continue diet, exercise  - Lifestyle modifications.  - Monitor appropriate labs    - Hemoglobin A1c - Insulin, random   5. Hyperlipidemia, mixed  - Lipid panel   6. Hypothyroidism (04/2021)   - TSH   7. Vitamin D deficiency  - Continue supplementation   - VITAMIN D 25 Hydroxy    8. Gouty arthritis of right great toe  - Uric acid   9. Aortic atherosclerosis (HCC) by CXR 05/12/2017  - Lipid panel   10. Medication management  - CBC with Differential/Platelet - COMPLETE METABOLIC PANEL WITH GFR - Magnesium - Lipid panel - TSH - Hemoglobin A1c - Insulin, random - VITAMIN D 25 Hydroxy  - Uric acid          Discussed  regular exercise, BP monitoring, weight control to achieve/maintain BMI less than 25 and discussed med and SE's. Recommended labs to assess /monitor clinical status .  I discussed the assessment and treatment plan with the patient. The patient was provided an opportunity to ask questions and all  were answered. The patient agreed with the plan and demonstrated an understanding of the instructions.  I provided over 30 minutes of exam, counseling, chart review and  complex critical decision making.        The patient was advised to call back or seek an in-person evaluation if the symptoms worsen or if the condition fails to improve as anticipated.   Marinus Maw, MD

## 2022-12-04 NOTE — Patient Instructions (Signed)

## 2022-12-05 ENCOUNTER — Ambulatory Visit (INDEPENDENT_AMBULATORY_CARE_PROVIDER_SITE_OTHER): Payer: HMO | Admitting: Internal Medicine

## 2022-12-05 ENCOUNTER — Encounter: Payer: Self-pay | Admitting: Internal Medicine

## 2022-12-05 VITALS — BP 128/64 | HR 77 | Temp 97.7°F | Resp 16 | Ht 63.0 in | Wt 138.0 lb

## 2022-12-05 DIAGNOSIS — E039 Hypothyroidism, unspecified: Secondary | ICD-10-CM

## 2022-12-05 DIAGNOSIS — M109 Gout, unspecified: Secondary | ICD-10-CM

## 2022-12-05 DIAGNOSIS — E559 Vitamin D deficiency, unspecified: Secondary | ICD-10-CM | POA: Diagnosis not present

## 2022-12-05 DIAGNOSIS — I10 Essential (primary) hypertension: Secondary | ICD-10-CM | POA: Diagnosis not present

## 2022-12-05 DIAGNOSIS — E782 Mixed hyperlipidemia: Secondary | ICD-10-CM

## 2022-12-05 DIAGNOSIS — N182 Chronic kidney disease, stage 2 (mild): Secondary | ICD-10-CM | POA: Diagnosis not present

## 2022-12-05 DIAGNOSIS — I480 Paroxysmal atrial fibrillation: Secondary | ICD-10-CM

## 2022-12-05 DIAGNOSIS — E1122 Type 2 diabetes mellitus with diabetic chronic kidney disease: Secondary | ICD-10-CM | POA: Diagnosis not present

## 2022-12-05 DIAGNOSIS — E1169 Type 2 diabetes mellitus with other specified complication: Secondary | ICD-10-CM | POA: Diagnosis not present

## 2022-12-05 DIAGNOSIS — Z79899 Other long term (current) drug therapy: Secondary | ICD-10-CM

## 2022-12-05 DIAGNOSIS — I700 Atherosclerosis of aorta: Secondary | ICD-10-CM

## 2022-12-05 DIAGNOSIS — E785 Hyperlipidemia, unspecified: Secondary | ICD-10-CM | POA: Diagnosis not present

## 2022-12-05 MED ORDER — ATORVASTATIN CALCIUM 40 MG PO TABS
ORAL_TABLET | ORAL | 3 refills | Status: AC
Start: 2022-12-05 — End: ?

## 2022-12-05 MED ORDER — APIXABAN 5 MG PO TABS: ORAL_TABLET | ORAL | 3 refills | Status: DC

## 2022-12-06 LAB — CBC WITH DIFFERENTIAL/PLATELET
Absolute Monocytes: 622 cells/uL (ref 200–950)
Basophils Absolute: 28 cells/uL (ref 0–200)
Basophils Relative: 0.5 %
Eosinophils Absolute: 99 cells/uL (ref 15–500)
Eosinophils Relative: 1.8 %
HCT: 42.4 % (ref 35.0–45.0)
Hemoglobin: 14 g/dL (ref 11.7–15.5)
Lymphs Abs: 1128 cells/uL (ref 850–3900)
MCH: 33.4 pg — ABNORMAL HIGH (ref 27.0–33.0)
MCHC: 33 g/dL (ref 32.0–36.0)
MCV: 101.2 fL — ABNORMAL HIGH (ref 80.0–100.0)
MPV: 10.8 fL (ref 7.5–12.5)
Monocytes Relative: 11.3 %
Neutro Abs: 3625 cells/uL (ref 1500–7800)
Neutrophils Relative %: 65.9 %
Platelets: 199 10*3/uL (ref 140–400)
RBC: 4.19 10*6/uL (ref 3.80–5.10)
RDW: 13.2 % (ref 11.0–15.0)
Total Lymphocyte: 20.5 %
WBC: 5.5 10*3/uL (ref 3.8–10.8)

## 2022-12-06 LAB — LIPID PANEL
Cholesterol: 161 mg/dL (ref <200)
HDL: 55 mg/dL (ref >=50)
LDL Cholesterol (Calc): 79 mg/dL (calc)
Non-HDL Cholesterol (Calc): 106 mg/dL (calc) (ref <130)
Total CHOL/HDL Ratio: 2.9 (calc) (ref <5.0)
Triglycerides: 172 mg/dL — ABNORMAL HIGH (ref <150)

## 2022-12-06 LAB — HEMOGLOBIN A1C
Hgb A1c MFr Bld: 5.7 % of total Hgb — ABNORMAL HIGH (ref <5.7)
Mean Plasma Glucose: 117 mg/dL
eAG (mmol/L): 6.5 mmol/L

## 2022-12-06 LAB — COMPLETE METABOLIC PANEL WITH GFR
AG Ratio: 2.2 (calc) (ref 1.0–2.5)
ALT: 27 U/L (ref 6–29)
AST: 26 U/L (ref 10–35)
Albumin: 4.7 g/dL (ref 3.6–5.1)
Alkaline phosphatase (APISO): 49 U/L (ref 37–153)
BUN: 20 mg/dL (ref 7–25)
CO2: 29 mmol/L (ref 20–32)
Calcium: 9.7 mg/dL (ref 8.6–10.4)
Chloride: 103 mmol/L (ref 98–110)
Creat: 0.69 mg/dL (ref 0.60–0.95)
Globulin: 2.1 g/dL (calc) (ref 1.9–3.7)
Glucose, Bld: 106 mg/dL — ABNORMAL HIGH (ref 65–99)
Potassium: 4.4 mmol/L (ref 3.5–5.3)
Sodium: 140 mmol/L (ref 135–146)
Total Bilirubin: 1 mg/dL (ref 0.2–1.2)
Total Protein: 6.8 g/dL (ref 6.1–8.1)
eGFR: 86 mL/min/{1.73_m2} (ref >=60)

## 2022-12-06 LAB — TSH: TSH: 5.04 mIU/L — ABNORMAL HIGH (ref 0.40–4.50)

## 2022-12-06 LAB — VITAMIN D 25 HYDROXY (VIT D DEFICIENCY, FRACTURES): Vit D, 25-Hydroxy: 84 ng/mL (ref 30–100)

## 2022-12-06 LAB — URIC ACID: Uric Acid, Serum: 3.1 mg/dL (ref 2.5–7.0)

## 2022-12-06 LAB — MAGNESIUM: Magnesium: 1.8 mg/dL (ref 1.5–2.5)

## 2022-12-06 LAB — INSULIN, RANDOM: Insulin: 10.1 u[IU]/mL

## 2022-12-07 NOTE — Progress Notes (Signed)
^<^<^<^<^<^<^<^<^<^<^<^<^<^<^<^<^<^<^<^<^<^<^<^<^<^<^<^<^<^<^<^<^<^<^<^<^ ^>^>^>^>^>^>^>^>^>^>^>>^>^>^>^>^>^>^>^>^>^>^>^>^>^>^>^>^>^>^>^>^>^>^>^>^>  -Test results slightly outside the reference range are not unusual. If there is anything important, I will review this with you,  otherwise it is considered normal test values.  If you have further questions,  please do not hesitate to contact me at the office or via My Chart.   ^<^<^<^<^<^<^<^<^<^<^<^<^<^<^<^<^<^<^<^<^<^<^<^<^<^<^<^<^<^<^<^<^<^<^<^<^ ^>^>^>^>^>^>^>^>^>^>^>^>^>^>^>^>^>^>^>^>^>^>^>^>^>^>^>^>^>^>^>^>^>^>^>^>^ Chol = 161   Excellent   - Very low risk for Heart Attack  / Stroke ^>^>^>^>^>^>^>^>^>^>^>^>^>^>^>^>^>^>^>^>^>^>^>^>^>^>^>^>^>^>^>^>^>^>^>^>^  -  Triglycerides ( =  172     ) or fats in blood are too high                 (   Ideal or  Goal is less than 150  !  )    - Recommend avoid fried & greasy foods,  sweets / candy,   - Avoid white rice  (brown or wild rice or Quinoa is OK),   - Avoid white potatoes  (sweet potatoes are OK)   - Avoid anything made from white flour  - bagels, doughnuts, rolls, buns, biscuits, white and   wheat breads, pizza crust and traditional  pasta made of white flour & egg white  - (vegetarian pasta or spinach or wheat pasta is OK).    - Multi-grain bread is OK - like multi-grain flat bread or  sandwich thins.   - Avoid alcohol in excess.   - Exercise is also important.  ^>^>^>^>^>^>^>^>^>^>^>^>^>^>^>^>^>^>^>^>^>^>^>^>^>^>^>^>^>^>^>^>^>^>^>^>^ ^>^>^>^>^>^>^>^>^>^>^>^>^>^>^>^>^>^>^>^>^>^>^>^>^>^>^>^>^>^>^>^>^>^>^>^>^  - TSH ( Thyroid) is slightly out of range but not enough to start a new pill   - Please be sure NOT  taking Biotin supplement which                                                              can cause incorrect levels of TSH !   ^>^>^>^>^>^>^>^>^>^>^>^>^>^>^>^>^>^>^>^>^>^>^>^>^>^>^>^>^>^>^>^>^>^>^>^>^  -  A1c = 5.7 % is Much Much Better !  !  !                                                                   - almost back in Normal range  !   ^>^>^>^>^>^>^>^>^>^>^>^>^>^>^>^>^>^>^>^>^>^>^>^>^>^>^>^>^>^>^>^>^>^>^>^>^  -  Magnesium  = 1.8      is  very  low- goal is betw 2.0 - 2.5,   - So..............Marland Kitchen  Recommend that you take                                                                 Magnesium 500 mg tablet 2 x /day with meals   - also important to eat lots of  leafy green vegetables   - spinach - Kale - collards - greens - okra - asparagus - broccoli - quinoa - squash - almonds   - black, red, white beans  -  peas - green beans  ^>^>^>^>^>^>^>^>^>^>^>^>^>^>^>^>^>^>^>^>^>^>^>^>^>^>^>^>^>^>^>^>^>^>^>^>^ ^>^>^>^>^>^>^>^>^>^>^>^>^>^>^>^>^>^>^>^>^>^>^>^>^>^>^>^>^>^>^>^>^>^>^>^>^  -  Vitamin D = 84  Excellent -- Please keep dosage  same  ^>^>^>^>^>^>^>^>^>^>^>^>^>^>^>^>^>^>^>^>^>^>^>^>^>^>^>^>^>^>^>^>^>^>^>^>^ ^>^>^>^>^>^>^>^>^>^>^>^>^>^>^>^>^>^>^>^>^>^>^>^>^>^>^>^>^>^>^>^>^>^>^>^>^  - Uric Acid /  Gout level is Normal - Please continue Allopurinol same   ^>^>^>^>^>^>^>^>^>^>^>^>^>^>^>^>^>^>^>^>^>^>^>^>^>^>^>^>^>^>^>^>^>^>^>^>^ ^>^>^>^>^>^>^>^>^>^>^>^>^>^>^>^>^>^>^>^>^>^>^>^>^>^>^>^>^>^>^>^>^>^>^>^>^  -  All Else - CBC - Kidneys - Electrolytes - Liver - Magnesium & Thyroid    - all  Normal / OK ^>^>^>^>^>^>^>^>^>^>^>^>^>^>^>^>^>^>^>^>^>^>^>^>^>^>^>^>^>^>^>^>^>^>^>^>^ ^>^>^>^>^>^>^>^>^>^>^>^>^>^>^>^>^>^>^>^>^>^>^>^>^>^>^>^>^>^>^>^>^>^>^>^>^

## 2023-01-15 ENCOUNTER — Other Ambulatory Visit: Payer: Self-pay | Admitting: Nurse Practitioner

## 2023-03-24 ENCOUNTER — Ambulatory Visit (INDEPENDENT_AMBULATORY_CARE_PROVIDER_SITE_OTHER): Payer: HMO | Admitting: Nurse Practitioner

## 2023-03-24 ENCOUNTER — Encounter: Payer: Self-pay | Admitting: Nurse Practitioner

## 2023-03-24 VITALS — BP 132/78 | HR 73 | Temp 97.1°F | Resp 18 | Ht 63.0 in | Wt 138.0 lb

## 2023-03-24 DIAGNOSIS — F4321 Adjustment disorder with depressed mood: Secondary | ICD-10-CM | POA: Diagnosis not present

## 2023-03-24 DIAGNOSIS — I10 Essential (primary) hypertension: Secondary | ICD-10-CM | POA: Diagnosis not present

## 2023-03-24 DIAGNOSIS — E559 Vitamin D deficiency, unspecified: Secondary | ICD-10-CM

## 2023-03-24 DIAGNOSIS — J449 Chronic obstructive pulmonary disease, unspecified: Secondary | ICD-10-CM

## 2023-03-24 DIAGNOSIS — I700 Atherosclerosis of aorta: Secondary | ICD-10-CM

## 2023-03-24 DIAGNOSIS — Z0001 Encounter for general adult medical examination with abnormal findings: Secondary | ICD-10-CM

## 2023-03-24 DIAGNOSIS — E782 Mixed hyperlipidemia: Secondary | ICD-10-CM

## 2023-03-24 DIAGNOSIS — E1122 Type 2 diabetes mellitus with diabetic chronic kidney disease: Secondary | ICD-10-CM | POA: Diagnosis not present

## 2023-03-24 DIAGNOSIS — R7989 Other specified abnormal findings of blood chemistry: Secondary | ICD-10-CM | POA: Diagnosis not present

## 2023-03-24 DIAGNOSIS — Z79899 Other long term (current) drug therapy: Secondary | ICD-10-CM

## 2023-03-24 DIAGNOSIS — R6889 Other general symptoms and signs: Secondary | ICD-10-CM

## 2023-03-24 DIAGNOSIS — I480 Paroxysmal atrial fibrillation: Secondary | ICD-10-CM

## 2023-03-24 DIAGNOSIS — Z0000 Encounter for general adult medical examination without abnormal findings: Secondary | ICD-10-CM

## 2023-03-24 DIAGNOSIS — N182 Chronic kidney disease, stage 2 (mild): Secondary | ICD-10-CM

## 2023-03-24 DIAGNOSIS — E663 Overweight: Secondary | ICD-10-CM | POA: Diagnosis not present

## 2023-03-24 DIAGNOSIS — K219 Gastro-esophageal reflux disease without esophagitis: Secondary | ICD-10-CM

## 2023-03-24 DIAGNOSIS — E039 Hypothyroidism, unspecified: Secondary | ICD-10-CM | POA: Diagnosis not present

## 2023-03-24 NOTE — Progress Notes (Signed)
MEDICARE ANNUAL WELLNESS VISIT AND 3  MONTH FOLLOW UP  Assessment:   Diagnoses and all orders for this visit:  Annual Medicare Wellness Visit Due annually  Health maintenance reviewed  Atherosclerosis of aorta (HCC) - per CXR 05/12/2017 Continue Atorvastatin Control blood pressure, cholesterol, glucose, increase exercise.   Essential hypertension Continue Amiodarone, Amlodipine, Olmesartan,  Discussed DASH (Dietary Approaches to Stop Hypertension) DASH diet is lower in sodium than a typical American diet. Cut back on foods that are high in saturated fat, cholesterol, and trans fats. Eat more whole-grain foods, fish, poultry, and nuts Remain active and exercise as tolerated daily.  Monitor BP at home-Call if greater than 130/80.   Hyperlipidemia associated with type 2 diabetes mellitus (HCC) Aortic atherosclerosis Continue Atorvastatin Discussed lifestyle modifications. Recommended diet heavy in fruits and veggies, omega 3's. Decrease consumption of animal meats, cheeses, and dairy products. Remain active and exercise as tolerated. Continue to monitor.  Type 2 diabetes mellitus with stage 2 chronic kidney disease, without long-term current use of insulin (HCC) Continue Metformin Controlled with diet and exercise Discussed general issues about diabetes pathophysiology and management. Education: Reviewed 'ABCs' of diabetes management (respective goals in parentheses):  A1C (<7), blood pressure (<130/80), and cholesterol (LDL <70) Dietary recommendations Encouraged aerobic exercise.  Discussed foot care, check daily Yearly retinal exam Dental exam every 6 months Continue to monitor  Vitamin D deficiency Continue supplementation Check and monitor levels  Gastroesophageal reflux disease without esophagitis Continue Omeprazole PRN. No suspected reflux complications (Barret/stricture). Lifestyle modification:  wt loss, avoid meals 2-3h before bedtime. Consider eliminating  food triggers:  chocolate, caffeine, EtOH, acid/spicy food.  CKD stage 2 due to type 2 diabetes mellitus (HCC) Continue ARB, statin Discussed how what you eat and drink can aide in kidney protection. Stay well hydrated. Avoid high salt foods. Avoid NSAIDS. Keep BP and BG well controlled.   Take medications as prescribed. Remain active and exercise as tolerated daily. Maintain weight.  Continue to monitor.  Chronic obstructive pulmonary disease, unspecified COPD type (HCC) Former smoker, COPD per imaging Controlled Continue to monitor   Overweight (BMI 25.0-29.9) Discussed appropriate BMI Diet modification. Physical activity. Encouraged/praised to build confidence.  Medication management All medications discussed and reviewed in full. All questions and concerns regarding medications addressed.    Situational stress/depression Continue wellbutrin daily for mood/energy Stress management techniques discussed, increase water, good sleep hygiene discussed, increase exercise, and increase veggies.   Atrial fibrillation Currently in [facility]iology following Continue Amiodarone, Apixaban ZIO patch currently in place  Hypothyroidism TSH elevated 11/2022 Continue Levothyroxine. Reminded to take on an empty stomach 30-61mins before food.  Stop any Biotin Supplement 48-72 hours before next TSH level to reduce the risk of falsely low TSH levels. Continue to monitor.       Notify office for further evaluation and treatment, questions or concerns if any reported s/s fail to improve.   The patient was advised to call back or seek an in-person evaluation if any symptoms worsen or if the condition fails to improve as anticipated.   Further disposition pending results of labs. Discussed med's effects and SE's.    I discussed the assessment and treatment plan with the patient. The patient was provided an opportunity to ask questions and all were answered. The patient agreed with the  plan and demonstrated an understanding of the instructions.  Discussed med's effects and SE's. Screening labs and tests as requested with regular follow-up as recommended.  I provided 40 minutes of face-to-face time  during this encounter including counseling, chart review, and critical decision making was preformed.    Future Appointments  Date Time Provider Department Center  06/25/2023 10:00 AM Adela Glimpse, NP [facility] None  09/29/2023 10:30 AM Adela Glimpse, NP [facility] None     Plan:   During the course of the visit the patient was educated and counseled about appropriate screening and preventive services including:   Pneumococcal vaccine  Prevnar 13 Influenza vaccine Td vaccine Screening electrocardiogram Bone densitometry screening Colorectal cancer screening Diabetes screening Glaucoma screening Nutrition counseling  Advanced directives: requested   Subjective:  Debra Collins is a 87 y.o. female who presents for Medicare Annual Wellness Visit and 3 month follow up. She has Essential hypertension; Vitamin D deficiency; Type 2 diabetes mellitus (HCC); Chronic obstructive pulmonary disease (HCC); Overweight (BMI 25.0-29.9); Gastroesophageal reflux disease without esophagitis; Unsteady gait; CKD stage 2 due to type 2 diabetes mellitus (HCC); Hyperlipidemia; Aortic atherosclerosis (HCC) by CXR 05/12/2017; Paresthesia and pain of both upper extremities; Situational depression; Persistent atrial fibrillation (HCC); and Symptomatic bradycardia on their problem list.   She shares that she will be placing her husband with [facility] on Monday 03/31/23.  He is currently suffering from dementia.  She has continued to care for her husband for the last several years.    She had a ER visit on 07/04/22 for symptomatic bradycardia.  She had been referred to Dr. Carolan Clines on 05/23/22 for atrial fibrillation.  She had an echo ordered and plan was for outpatient DCCV after 3  weeks of uninterrupted anticoagulation.  She underwent successful DCCV on 06/13/22 with restoration of sinus rhythm.  She had to be cardioverted 3 times before restoration of sinus rhythm.  When she followed up with Dr. Royann Shivers on 06/20/22 she was noted to be back in atrial fibrillation.  Medical therapy was felt to be the best option in the view of her age.  Plan was to obtain a Myoview and if no ischemia start Flecainide.  Myoview on 06/25/22 was low risk and showed no evidence of ischemia.  Echo on 06/26/22 showed LVEF of 60-65% with normal wall motion and no significant valvular disease.  Given negative Myoview she was started on Flecainide. She presented on 07/04/22 with weakness, ED called and she was noted to be markedly bradycardic with rates in the high 20s.  She was started on Amiodarone 200 mg BID.  She was then scheduled for an outpatient cardioversion on 07/19/22.  She followed up with Dr. Royann Shivers for cardioversion with a dx of AFIB.  She was cardioverted 1 time and EKG showed NSR.  She is wearing a ZIO patch heart monitor today and will complete for one more week.    She has remote hx of bil mastectomy in 1985 for severe fibrocystic breast disease.   She uses meloxicam/voltaren gel for joint pains, well controlled.   GERD well controlled with omeprazole after failure of famotidine.   BMI is Body mass index is 24.45 kg/m., she has not been working on diet and exercise, but generally active around home/garden and full time caregiver for her husband.  Wt Readings from Last 3 Encounters:  03/24/23 138 lb (62.6 kg)  12/05/22 138 lb (62.6 kg)  09/23/22 138 lb (62.6 kg)   Her blood pressure has been controlled at home, today their BP is BP: 132/78   She does not workout. She denies chest pain, shortness of breath, dizziness.   She is a remote former smoker, COPD per CXR  in 2018. Denies cough/productivity, exertional sx. Reports occasional wheeze.   Aortic atherosclerosis per CXR 05/12/2017.     She is on cholesterol medication (atorvastatin 80 mg taking half pill and denies myalgias. Her cholesterol is not at goal. The cholesterol last visit was:    Lab Results  Component Value Date   CHOL 161 12/05/2022   HDL 55 12/05/2022   LDLCALC 79 12/05/2022   TRIG 172 (H) 12/05/2022   CHOLHDL 2.9 12/05/2022    She has been working on diet and exercise for diabetes with CKD (controlled in prediabetes range with meds, didn't like metformin made her feel, just recently increased trulicity to 4.5 mg weekly and tolerating well), she is on bASA, she is on ACE, and denies paresthesia of the feet, polydipsia and polyuria. Last A1C in the office was:  Lab Results  Component Value Date   HGBA1C 5.7 (H) 12/05/2022   Lab Results  Component Value Date   EGFR 86 12/05/2022   EGFR 80 07/16/2022   EGFR 82 07/10/2022   Lab Results  Component Value Date   MICRALBCREAT 14 05/06/2022   She did change her levothyroxine dose, taking 1 tab 50 mcg daily.  Lab Results  Component Value Date   TSH 5.04 (H) 12/05/2022   Patient is on Vitamin D supplement - 5000 units daily, but forgets to take   Lab Results  Component Value Date   VD25OH 84 12/05/2022       Medication Review:  Current Outpatient Medications on File Prior to Visit  Medication Sig Dispense Refill   acetaminophen (TYLENOL) 500 MG tablet Take 1,000 mg by mouth every 6 (six) hours as needed for moderate pain.     allopurinol (ZYLOPRIM) 100 MG tablet Take 1 tablet (100 mg total) by mouth daily. 90 tablet 3   amiodarone (PACERONE) 200 MG tablet Take 1 tablet (200 mg total) by mouth daily. 90 tablet 3   amLODipine (NORVASC) 2.5 MG tablet Take 1 tablet (2.5 mg total) by mouth daily. 30 tablet 11   apixaban (ELIQUIS) 5 MG TABS tablet Take  1 tablet  2 x /day  for AFib & to Prevent Blood Clots 180 tablet 3   atorvastatin (LIPITOR) 40 MG tablet Take  1 tablet  Daily  for Cholesterol. 90 tablet 3   Brimonidine Tartrate (LUMIFY) 0.025 % SOLN  Place 1 drop into both eyes 2 (two) times daily as needed (eye redness/irritation.).     buPROPion (WELLBUTRIN XL) 150 MG 24 hr tablet TAKE 1 TABLET BY MOUTH EVERY MORNING 90 tablet 0   Cholecalciferol (DIALYVITE VITAMIN D 5000) 125 MCG (5000 UT) capsule Take 5,000 Units by mouth daily.     metFORMIN (GLUCOPHAGE-XR) 500 MG 24 hr tablet Take 1 tablet (500 mg total) by mouth 2 (two) times daily with a meal. 180 tablet 6   Multiple Vitamin (MULTIVITAMIN WITH MINERALS) TABS tablet Take 1 tablet by mouth in the morning.     olmesartan (BENICAR) 40 MG tablet Take  1 tablet  Daily for BP                                                                     /  TAKE                                  BY                               MOUTH (Patient taking differently: Take 40 mg by mouth daily.) 90 tablet 3   omeprazole (PRILOSEC) 20 MG capsule Take  1 capsule  Daily  to Prevent Heartburn & Indigestion                                    /                                TAKE                          BY                       MOUTH (Patient taking differently: Take 20 mg by mouth daily.) 90 capsule 3   vitamin B-12 (CYANOCOBALAMIN) 1000 MCG tablet Take 1,000 mcg by mouth daily.     No current facility-administered medications on file prior to visit.    Current Problems (verified) Patient Active Problem List   Diagnosis Date Noted   Symptomatic bradycardia 07/04/2022   Persistent atrial fibrillation (HCC) 06/13/2022   Situational depression 10/12/2021   Paresthesia and pain of both upper extremities 01/30/2021   Aortic atherosclerosis (HCC) by CXR 05/12/2017 07/11/2020   Hyperlipidemia 06/22/2019   CKD stage 2 due to type 2 diabetes mellitus (HCC) 03/15/2019   Unsteady gait 03/12/2018   Gastroesophageal reflux disease without esophagitis 03/11/2018   Overweight (BMI 25.0-29.9) 08/11/2017   Chronic obstructive pulmonary disease (HCC) 01/29/2017   Type 2 diabetes mellitus (HCC)  08/16/2014   Essential hypertension 07/25/2013   Vitamin D deficiency 07/25/2013    Screening Tests Immunization History  Administered Date(s) Administered   H1N1 06/28/2008   Influenza Whole 05/04/2013   Influenza, High Dose Seasonal PF 04/26/2014, 04/26/2015, 04/01/2016, 05/07/2017, 04/07/2019, 04/26/2020, 04/14/2022   Influenza-Unspecified 04/14/2018, 04/11/2021   PFIZER(Purple Top)SARS-COV-2 Vaccination 07/28/2019, 08/28/2019, 04/16/2020   Pfizer Covid-19 Vaccine Bivalent Booster 21yrs & up 04/04/2021, 04/14/2022   Pneumococcal Conjugate-13 04/26/2014   Pneumococcal Polysaccharide-23 07/10/2011   Rsv, Bivalent, Protein Subunit Rsvpref,pf Verdis Frederickson) 04/14/2022   Td 07/10/2011   Zoster, Live 06/28/2008   Last colonoscopy: 2005 declines another Last mammogram: bilateral mastectomy in 1985 due to severe fibrocystic breast disease, had implants which were also removed.  Updated 11/2021 - NO SPECIFIC MAMMOGRAPHIC EVIDENCE OF MALIGNANCY.  NEXT SCREENING MAMMOGRAM IS RECOMMENDED IN ONE  YEAR. Due 01/2023 DEXA: 01/2016 - normal - Due US transvaginal 08/2016  Prior vaccinations: TD or Tdap: 2012 - declines, want to get PRN  Influenza: 04/2022 Pneumococcal: 2012 Prevnar13: 2015 Shingles/Zostavax: 2009  Names of Other Physician/Practitioners you currently use: 1. [facility] here for primary care 2. Dr. Nile Riggs, eye doctor, last visit 02/2022 no retinopathy  3., Dr. Fanny Dance dentist, last visit 2023, goes q36m  Patient Care Team: Lucky Cowboy, MD as PCP - General (Internal Medicine) Manning Charity, OD (Optometry)  Allergies No Known Allergies   SURGICAL HISTORY She  has a past surgical history that includes Knee arthroscopy (Left); Appendectomy; Mastectomy (Bilateral, 1985); Cataract extraction, bilateral (Bilateral, 09/2017); Cardioversion (N/A, 06/13/2022); TEMPORARY PACEMAKER (N/A, 07/04/2022); and Cardioversion (N/A, 07/19/2022). FAMILY  HISTORY Her family history includes Heart attack in her father and sister; Heart disease in her father and mother; Stroke in her mother. SOCIAL HISTORY She  reports that she quit smoking about 29 years ago. Her smoking use included cigarettes. She has never used smokeless tobacco. She reports that she does not drink alcohol and does not use drugs.  MEDICARE WELLNESS OBJECTIVES: Physical activity: Current Exercise Habits: Home exercise routine Cardiac risk factors:   Depression/mood screen:      03/24/2023   11:49 AM  Depression screen PHQ 2/9  Decreased Interest 0  Down, Depressed, Hopeless 0  PHQ - 2 Score 0    ADLs:     03/24/2023   11:35 AM 08/12/2022   10:46 AM  In your present state of health, do you have any difficulty performing the following activities:  Hearing? 0 0  Vision? 0 0  Difficulty concentrating or making decisions? 0 1  Walking or climbing stairs? 0 1  Dressing or bathing? 0 0  Doing errands, shopping? 0 0  Preparing Food and eating ?  N  Using the Toilet?  N  In the past six months, have you accidently leaked urine?  N  Do you have problems with loss of bowel control?  N  Managing your Medications?  N  Managing your Finances?  N  Housekeeping or managing your Housekeeping?  N     Cognitive Testing  Alert? Yes  Normal Appearance?Yes  Oriented to person? Yes  Place? Yes   Time? Yes  Recall of three objects?  Yes  Can perform simple calculations? Yes  Displays appropriate judgment?Yes  Can read the correct time from a watch face?Yes  EOL planning:     Review of Systems  Constitutional:  Negative for chills, fever, malaise/fatigue and weight loss.  HENT:  Negative for congestion, ear pain, hearing loss, sore throat and tinnitus.   Eyes: Negative.  Negative for blurred vision and double vision.  Respiratory:  Negative for cough, sputum production, shortness of breath and wheezing.   Cardiovascular:  Negative for chest pain, palpitations, orthopnea,  claudication, leg swelling and PND.  Gastrointestinal:  Negative for abdominal pain, blood in stool, constipation, diarrhea, heartburn, melena, nausea and vomiting.  Genitourinary: Negative.   Musculoskeletal:  Negative for falls, joint pain and myalgias.  Skin: Negative.  Negative for rash.  Neurological:  Negative for dizziness, tingling, sensory change, loss of consciousness, weakness and headaches.  Endo/Heme/Allergies:  Negative for polydipsia.  Psychiatric/Behavioral:  Positive for depression. Negative for memory loss, substance abuse and suicidal ideas. The patient is not nervous/anxious and does not have insomnia.   All other systems reviewed and are negative.    Objective:     Today's Vitals   03/24/23 1148  BP: 132/78  Pulse: 73  Resp: 18  Temp: (!) 97.1 F (36.2 C)  SpO2: 98%  Weight: 138 lb (62.6 kg)  Height: 5\' 3"  (1.6 m)    Body mass index is 24.45 kg/m.  General appearance: alert, no distress, WD/WN, female HEENT: normocephalic, sclerae anicteric, TMs pearly, nares patent, no discharge or erythema, pharynx normal Oral cavity: MMM, no lesions Neck: supple, no lymphadenopathy, no thyromegaly, no masses Heart: RRR, normal S1, S2, no murmurs ZIO patch Lungs: CTA bilaterally, no wheezes, rhonchi, or rales Abdomen: +bs, soft, non tender,  non distended, no masses, no hepatomegaly, no splenomegaly Musculoskeletal: nontender, no swelling, no obvious deformity Extremities: no edema, no cyanosis, no clubbing Pulses: 2+ symmetric, upper and lower extremities, normal cap refill Neurological: alert, oriented x 3, CN2-12 intact, strength normal upper extremities, LLE mildly weak, sensation normal throughout, DTRs 2+ throughout, no cerebellar signs, gait slow Psychiatric: normal affect, behavior normal, pleasant  Breasts: S/p bil mastectomy, no palpable mass or axillary lymphadenopathy,   Medicare Attestation I have personally reviewed: The patient's medical and social  history Their use of alcohol, tobacco or illicit drugs Their current medications and supplements The patient's functional ability including ADLs,fall risks, home safety risks, cognitive, and hearing and visual impairment Diet and physical activities Evidence for depression or mood disorders  The patient's weight, height, BMI, and visual acuity have been recorded in the chart.  I have made referrals, counseling, and provided education to the patient based on review of the above and I have provided the patient with a written personalized care plan for preventive services.     Adela Glimpse, NP   03/24/2023

## 2023-03-24 NOTE — Patient Instructions (Signed)

## 2023-03-25 LAB — LIPID PANEL
Cholesterol: 165 mg/dL (ref <200)
HDL: 55 mg/dL (ref >=50)
LDL Cholesterol (Calc): 82 mg/dL
Non-HDL Cholesterol (Calc): 110 mg/dL (ref <130)
Total CHOL/HDL Ratio: 3 (calc) (ref <5.0)
Triglycerides: 182 mg/dL — ABNORMAL HIGH (ref <150)

## 2023-03-25 LAB — COMPLETE METABOLIC PANEL WITH GFR
AG Ratio: 2.1 (calc) (ref 1.0–2.5)
ALT: 28 U/L (ref 6–29)
AST: 26 U/L (ref 10–35)
Albumin: 4.9 g/dL (ref 3.6–5.1)
Alkaline phosphatase (APISO): 47 U/L (ref 37–153)
BUN: 19 mg/dL (ref 7–25)
CO2: 28 mmol/L (ref 20–32)
Calcium: 10 mg/dL (ref 8.6–10.4)
Chloride: 102 mmol/L (ref 98–110)
Creat: 0.84 mg/dL (ref 0.60–0.95)
Globulin: 2.3 g/dL (ref 1.9–3.7)
Glucose, Bld: 95 mg/dL (ref 65–99)
Potassium: 4.4 mmol/L (ref 3.5–5.3)
Sodium: 141 mmol/L (ref 135–146)
Total Bilirubin: 1.2 mg/dL (ref 0.2–1.2)
Total Protein: 7.2 g/dL (ref 6.1–8.1)
eGFR: 68 mL/min/{1.73_m2} (ref >=60)

## 2023-03-25 LAB — CBC WITH DIFFERENTIAL/PLATELET
Absolute Monocytes: 531 {cells}/uL (ref 200–950)
Basophils Absolute: 18 {cells}/uL (ref 0–200)
Basophils Relative: 0.3 %
Eosinophils Absolute: 100 {cells}/uL (ref 15–500)
Eosinophils Relative: 1.7 %
HCT: 42.8 % (ref 35.0–45.0)
Hemoglobin: 14.1 g/dL (ref 11.7–15.5)
Lymphs Abs: 1416 {cells}/uL (ref 850–3900)
MCH: 32.9 pg (ref 27.0–33.0)
MCHC: 32.9 g/dL (ref 32.0–36.0)
MCV: 100 fL (ref 80.0–100.0)
MPV: 10.4 fL (ref 7.5–12.5)
Monocytes Relative: 9 %
Neutro Abs: 3835 {cells}/uL (ref 1500–7800)
Neutrophils Relative %: 65 %
Platelets: 200 10*3/uL (ref 140–400)
RBC: 4.28 10*6/uL (ref 3.80–5.10)
RDW: 13.1 % (ref 11.0–15.0)
Total Lymphocyte: 24 %
WBC: 5.9 10*3/uL (ref 3.8–10.8)

## 2023-03-25 LAB — HEMOGLOBIN A1C
Hgb A1c MFr Bld: 6 %{Hb} — ABNORMAL HIGH (ref <5.7)
Mean Plasma Glucose: 126 mg/dL
eAG (mmol/L): 7 mmol/L

## 2023-03-25 LAB — TSH: TSH: 3.83 m[IU]/L (ref 0.40–4.50)

## 2023-04-09 ENCOUNTER — Ambulatory Visit (INDEPENDENT_AMBULATORY_CARE_PROVIDER_SITE_OTHER): Payer: HMO | Admitting: Nurse Practitioner

## 2023-04-09 ENCOUNTER — Ambulatory Visit (HOSPITAL_COMMUNITY)
Admission: RE | Admit: 2023-04-09 | Discharge: 2023-04-09 | Disposition: A | Discharge status: Home / Self Care | Payer: HMO | Source: Ambulatory Visit | Attending: Nurse Practitioner | Admitting: Nurse Practitioner

## 2023-04-09 ENCOUNTER — Encounter: Payer: Self-pay | Admitting: Nurse Practitioner

## 2023-04-09 VITALS — BP 140/74 | HR 76 | Temp 98.3°F | Ht 63.0 in | Wt 138.0 lb

## 2023-04-09 DIAGNOSIS — S61216A Laceration without foreign body of right little finger without damage to nail, initial encounter: Secondary | ICD-10-CM

## 2023-04-09 DIAGNOSIS — L539 Erythematous condition, unspecified: Secondary | ICD-10-CM | POA: Diagnosis not present

## 2023-04-09 DIAGNOSIS — W19XXXA Unspecified fall, initial encounter: Secondary | ICD-10-CM | POA: Diagnosis not present

## 2023-04-09 DIAGNOSIS — S0191XA Laceration without foreign body of unspecified part of head, initial encounter: Secondary | ICD-10-CM | POA: Insufficient documentation

## 2023-04-09 DIAGNOSIS — R600 Localized edema: Secondary | ICD-10-CM | POA: Insufficient documentation

## 2023-04-09 DIAGNOSIS — Z7901 Long term (current) use of anticoagulants: Secondary | ICD-10-CM | POA: Insufficient documentation

## 2023-04-09 DIAGNOSIS — I6782 Cerebral ischemia: Secondary | ICD-10-CM | POA: Diagnosis not present

## 2023-04-09 DIAGNOSIS — S0990XA Unspecified injury of head, initial encounter: Secondary | ICD-10-CM | POA: Diagnosis not present

## 2023-04-09 DIAGNOSIS — S0010XA Contusion of unspecified eyelid and periocular area, initial encounter: Secondary | ICD-10-CM | POA: Diagnosis not present

## 2023-04-09 DIAGNOSIS — H1132 Conjunctival hemorrhage, left eye: Secondary | ICD-10-CM

## 2023-04-09 DIAGNOSIS — R58 Hemorrhage, not elsewhere classified: Secondary | ICD-10-CM | POA: Diagnosis not present

## 2023-04-09 DIAGNOSIS — M19041 Primary osteoarthritis, right hand: Secondary | ICD-10-CM | POA: Diagnosis not present

## 2023-04-09 DIAGNOSIS — S61219A Laceration without foreign body of unspecified finger without damage to nail, initial encounter: Secondary | ICD-10-CM | POA: Diagnosis not present

## 2023-04-09 NOTE — Progress Notes (Signed)
Assessment and Plan:  Debra Collins was seen today for an episodic visit.  Diagnoses and all order for this visit:  Fall, initial encounter  - CT HEAD WO CONTRAST ( ); Future  Traumatic head injury with multiple lacerations, initial encounter Neurological assessment intact. Continue to monitor for increase in neurological changes including HA, N/V, paralysis, difficulty speaking, trouble walking, confusion, vision changes.  - CT HEAD WO CONTRAST ( ); Future  Periorbital ecchymosis, unspecified laterality, initial encounter  - CT HEAD WO CONTRAST ( ); Future  Non-traumatic subconjunctival hemorrhage of left eye Does not cross iris. Reassured improvement Continue to monitor for increase in vision changes.  - CT HEAD WO CONTRAST ( ); Future  Current use of long term anticoagulation Eliquis  - CT HEAD WO CONTRAST ( ); Future  Laceration of right little finger without foreign body without damage to nail, initial encounter Past the point to suturing. Continue to apply triple abx ointment, keep wrapped. X-ray to assess for underlying fx given LROM and misalignment to DIP.  - DG Finger Little Right; Future  Localized erythema RICE Method to right 5th digit.  - CT HEAD WO CONTRAST ( ); Future - DG Finger Little Right; Future  Ecchymosis Continue to monitor for healing of ecchymosis.  - CT HEAD WO CONTRAST ( ); Future  Localized edema RICE Method to right 5th digit Possible referral to Orthopedics pending x-ray.  - CT HEAD WO CONTRAST ( ); Future - DG Finger Little Right; Future  Notify office for further evaluation and treatment, questions or concerns if s/s fail to improve. The risks and benefits of my recommendations, as well as other treatment options were discussed with the patient today. Questions were answered.  Further disposition pending results of labs. Discussed med's effects and SE's.    Over 30 minutes of exam, counseling, chart review,  and critical decision making was performed.   Future Appointments  Date Time Provider Department Center  06/25/2023 10:00 AM Adela Glimpse, NP [facility] None  09/29/2023 10:30 AM Efren Kross, Archie Patten, NP [facility] None    ------------------------------------------------------------------------------------------------------------------   HPI BP (!) 140/74   Pulse 76   Temp 98.3 F (36.8 C)   Ht 5\' 3"  (1.6 m)   Wt 138 lb (62.6 kg)   SpO2 98%   BMI 24.45 kg/m   87 y.o.female presents for evaluation of traumatic head injury after sustaining a fall Monday morning (2 days ago).  States that she was walking down her hallway with shoes and stumbled on the hard wood floors, falling face forward.  She was alone.  She reports bleeding from her nostrils as well and cutting the bridge of her nose.  She is on long term anti-coagulant with Eliquis.  She was able to crawl to her phone and call a neighbor who helped her get cleaned up.  She also hit her right pinky finger and sustained a laceration to the end of the finger. She reports staying awake the rest of the evening.  She presents to clinic today with periorbital ecchymosis, left subconjunctival hemorrhage, laceration to bridge of nose.  Neurologically she is intact.  She does not c/o HA, does endorse some blurry vision but feels this is baseline given her age.    Past Medical History:  Diagnosis Date   DDD (degenerative disc disease), lumbar    Fatty liver disease, nonalcoholic    CT AB 2012   Fibrocystic breast disease    Hyperlipidemia    Hypertension    Type II or unspecified type diabetes mellitus without mention  of complication, not stated as uncontrolled    Vitamin D deficiency      No Known Allergies  Current Outpatient Medications on File Prior to Visit  Medication Sig   acetaminophen (TYLENOL) 500 MG tablet Take 1,000 mg by mouth every 6 (six) hours as needed for moderate pain.   allopurinol (ZYLOPRIM) 100 MG tablet Take 1  tablet (100 mg total) by mouth daily.   amiodarone (PACERONE) 200 MG tablet Take 1 tablet (200 mg total) by mouth daily.   amLODipine (NORVASC) 2.5 MG tablet Take 1 tablet (2.5 mg total) by mouth daily.   apixaban (ELIQUIS) 5 MG TABS tablet Take  1 tablet  2 x /day  for AFib & to Prevent Blood Clots   atorvastatin (LIPITOR) 40 MG tablet Take  1 tablet  Daily  for Cholesterol.   Brimonidine Tartrate (LUMIFY) 0.025 % SOLN Place 1 drop into both eyes 2 (two) times daily as needed (eye redness/irritation.).   buPROPion (WELLBUTRIN XL) 150 MG 24 hr tablet TAKE 1 TABLET BY MOUTH EVERY MORNING   Cholecalciferol (DIALYVITE VITAMIN D 5000) 125 MCG (5000 UT) capsule Take 5,000 Units by mouth daily.   metFORMIN (GLUCOPHAGE-XR) 500 MG 24 hr tablet Take 1 tablet (500 mg total) by mouth 2 (two) times daily with a meal.   Multiple Vitamin (MULTIVITAMIN WITH MINERALS) TABS tablet Take 1 tablet by mouth in the morning.   olmesartan (BENICAR) 40 MG tablet Take  1 tablet  Daily for BP                                                                     /                                   TAKE                                  BY                               MOUTH (Patient taking differently: Take 40 mg by mouth daily.)   omeprazole (PRILOSEC) 20 MG capsule Take  1 capsule  Daily  to Prevent Heartburn & Indigestion                                    /                                TAKE                          BY                       MOUTH (Patient taking differently: Take 20 mg by mouth daily.)   vitamin B-12 (CYANOCOBALAMIN) 1000 MCG tablet Take 1,000 mcg by mouth daily.   No current facility-administered medications on file  prior to visit.    ROS: all negative except what is noted in the HPI.   Physical Exam:  BP (!) 140/74   Pulse 76   Temp 98.3 F (36.8 C)   Ht 5\' 3"  (1.6 m)   Wt 138 lb (62.6 kg)   SpO2 98%   BMI 24.45 kg/m   General Appearance: NAD.  Awake, conversant and cooperative. Eyes: Left  subconjunctival hemorrhage, periorbital ecchymosis.  PERRLA, EOMs intact.   Sinuses: No frontal/maxillary tenderness.  No nasal discharge. Nares patent.  ENT/Mouth: Ext aud canals clear.  Bilateral TMs w/DOL and without erythema or bulging. Hearing intact.  Posterior pharynx without swelling or exudate.  Tonsils without swelling or erythema.  Neck: Supple.  No masses, nodules or thyromegaly. Respiratory: Effort is regular with non-labored breathing. Breath sounds are equal bilaterally without rales, rhonchi, wheezing or stridor.  Cardio: RRR with no MRGs. Brisk peripheral pulses without edema.  Abdomen: Active BS in all four quadrants.  Soft and non-tender without guarding, rebound tenderness, hernias or masses. Lymphatics: Non tender without lymphadenopathy.  Musculoskeletal: Full ROM, 5/5 strength, normal ambulation.  No clubbing or cyanosis. Skin: See picture attached below.   Neuro: CN II-XII grossly normal. Normal muscle tone without cerebellar symptoms and intact sensation.   Psych: AO X 3,  appropriate mood and affect, insight and judgment.     Adela Glimpse, NP 2:08 PM [facility]

## 2023-04-09 NOTE — Patient Instructions (Signed)
Fall Prevention in the Home, Adult Falls can cause injuries and affect people of all ages. There are many simple things that you can do to make your home safe and to help prevent falls. If you need it, ask for help making these changes. What actions can I take to prevent falls? General information Use good lighting in all rooms. Make sure to: Replace any light bulbs that burn out. Turn on lights if it is dark and use night-lights. Keep items that you use often in easy-to-reach places. Lower the shelves around your home if needed. Move furniture so that there are clear paths around it. Do not keep throw rugs or other things on the floor that can make you trip. If any of your floors are uneven, fix them. Add color or contrast paint or tape to clearly mark and help you see: Grab bars or handrails. First and last steps of staircases. Where the edge of each step is. If you use a ladder or stepladder: Make sure that it is fully opened. Do not climb a closed ladder. Make sure the sides of the ladder are locked in place. Have someone hold the ladder while you use it. Know where your pets are as you move through your home. What can I do in the bathroom?     Keep the floor dry. Clean up any water that is on the floor right away. Remove soap buildup in the bathtub or shower. Buildup makes bathtubs and showers slippery. Use non-skid mats or decals on the floor of the bathtub or shower. Attach bath mats securely with double-sided, non-slip rug tape. If you need to sit down while you are in the shower, use a non-slip stool. Install grab bars by the toilet and in the bathtub and shower. Do not use towel bars as grab bars. What can I do in the bedroom? Make sure that you have a light by your bed that is easy to reach. Do not use any sheets or blankets on your bed that hang to the floor. Have a firm bench or chair with side arms that you can use for support when you get dressed. What can I do in  the kitchen? Clean up any spills right away. If you need to reach something above you, use a sturdy step stool that has a grab bar. Keep electrical cables out of the way. Do not use floor polish or wax that makes floors slippery. What can I do with my stairs? Do not leave anything on the stairs. Make sure that you have a light switch at the top and the bottom of the stairs. Have them installed if you do not have them. Make sure that there are handrails on both sides of the stairs. Fix handrails that are broken or loose. Make sure that handrails are as long as the staircases. Install non-slip stair treads on all stairs in your home if they do not have carpet. Avoid having throw rugs at the top or bottom of stairs, or secure the rugs with carpet tape to prevent them from moving. Choose a carpet design that does not hide the edge of steps on the stairs. Make sure that carpet is firmly attached to the stairs. Fix any carpet that is loose or worn. What can I do on the outside of my home? Use bright outdoor lighting. Repair the edges of walkways and driveways and fix any cracks. Clear paths of anything that can make you trip, such as tools or rocks. Add  color or contrast paint or tape to clearly mark and help you see high doorway thresholds. Trim any bushes or trees on the main path into your home. Check that handrails are securely fastened and in good repair. Both sides of all steps should have handrails. Install guardrails along the edges of any raised decks or porches. Have leaves, snow, and ice cleared regularly. Use sand, salt, or ice melt on walkways during winter months if you live where there is ice and snow. In the garage, clean up any spills right away, including grease or oil spills. What other actions can I take? Review your medicines with your health care provider. Some medicines can make you confused or feel dizzy. This can increase your chance of falling. Wear closed-toe shoes that  fit well and support your feet. Wear shoes that have rubber soles and low heels. Use a cane, walker, scooter, or crutches that help you move around if needed. Talk with your provider about other ways that you can decrease your risk of falls. This may include seeing a physical therapist to learn to do exercises to improve movement and strength. Where to find more information Centers for Disease Control and Prevention, [employer]: TonerPromos.no [employer] on Aging: BaseRingTones.pl National Institute on Aging: BaseRingTones.pl Contact a health care provider if: You are afraid of falling at home. You feel weak, drowsy, or dizzy at home. You fall at home. Get help right away if you: Lose consciousness or have trouble moving after a fall. Have a fall that causes a head injury. These symptoms may be an emergency. Get help right away. Call 911. Do not wait to see if the symptoms will go away. Do not drive yourself to the hospital. This information is not intended to replace advice given to you by your health care provider. Make sure you discuss any questions you have with your health care provider. Document Revised: 03/04/2022 Document Reviewed: 03/04/2022 Elsevier Patient Education  2024 [employer].

## 2023-04-25 ENCOUNTER — Other Ambulatory Visit: Payer: Self-pay | Admitting: Internal Medicine

## 2023-04-25 DIAGNOSIS — I10 Essential (primary) hypertension: Secondary | ICD-10-CM

## 2023-04-25 DIAGNOSIS — F4321 Adjustment disorder with depressed mood: Secondary | ICD-10-CM

## 2023-04-25 DIAGNOSIS — K219 Gastro-esophageal reflux disease without esophagitis: Secondary | ICD-10-CM

## 2023-04-25 MED ORDER — OLMESARTAN MEDOXOMIL 40 MG PO TABS
ORAL_TABLET | ORAL | Status: DC
Start: 2023-04-25 — End: 2023-08-11

## 2023-04-25 MED ORDER — BUPROPION HCL ER (XL) 150 MG PO TB24
ORAL_TABLET | ORAL | 3 refills | Status: AC
Start: 2023-04-25 — End: ?

## 2023-04-25 MED ORDER — OMEPRAZOLE 20 MG PO CPDR
DELAYED_RELEASE_CAPSULE | ORAL | 3 refills | Status: DC
Start: 2023-04-25 — End: 2023-06-20

## 2023-05-12 ENCOUNTER — Encounter: Payer: HMO | Admitting: Internal Medicine

## 2023-05-16 ENCOUNTER — Telehealth: Payer: Self-pay | Admitting: Cardiovascular Disease

## 2023-05-16 DIAGNOSIS — I480 Paroxysmal atrial fibrillation: Secondary | ICD-10-CM

## 2023-05-16 DIAGNOSIS — Z79899 Other long term (current) drug therapy: Secondary | ICD-10-CM

## 2023-05-16 NOTE — Telephone Encounter (Signed)
Spoke to patient . Informed her that someone will contact her on Monday, if we have samples.  Patient states she spoke to health advantage nurse- Donut hole $435 dollars  ]patient states she has 2  pills left.   RN Informed patient to go online - print out Patient assistance form and take it to Dr Kathryne Sharper office Dr Oneta Rack 's started  Eliquis Patient voiced understanding

## 2023-05-16 NOTE — Telephone Encounter (Signed)
Pt's insurance called in on behalf of her asking for samples because pt is in donut hole.  Patient calling the office for samples of medication:   1.  What medication and dosage are you requesting samples for? apixaban (ELIQUIS) 5 MG TABS tablet   2.  Are you currently out of this medication? Yes

## 2023-05-19 ENCOUNTER — Encounter: Payer: HMO | Admitting: Internal Medicine

## 2023-05-19 NOTE — Telephone Encounter (Signed)
Pt returning nurses phone call. Please advise ?

## 2023-05-19 NOTE — Telephone Encounter (Signed)
Called patient with no answer. Left message to return call.

## 2023-05-21 MED ORDER — APIXABAN 5 MG PO TABS: ORAL_TABLET | ORAL | Status: AC

## 2023-05-21 NOTE — Telephone Encounter (Signed)
Called patient to inform samples ready for pick up.

## 2023-06-20 ENCOUNTER — Other Ambulatory Visit: Payer: Self-pay | Admitting: Internal Medicine

## 2023-06-20 DIAGNOSIS — K219 Gastro-esophageal reflux disease without esophagitis: Secondary | ICD-10-CM

## 2023-06-20 MED ORDER — OMEPRAZOLE 20 MG PO CPDR
DELAYED_RELEASE_CAPSULE | ORAL | 3 refills | Status: AC
Start: 2023-06-20 — End: ?

## 2023-06-25 ENCOUNTER — Encounter: Payer: Self-pay | Admitting: Nurse Practitioner

## 2023-06-25 ENCOUNTER — Ambulatory Visit (INDEPENDENT_AMBULATORY_CARE_PROVIDER_SITE_OTHER): Payer: HMO | Admitting: Nurse Practitioner

## 2023-06-25 VITALS — BP 156/70 | HR 69 | Temp 97.3°F | Ht 62.5 in | Wt 139.8 lb

## 2023-06-25 DIAGNOSIS — Z0000 Encounter for general adult medical examination without abnormal findings: Secondary | ICD-10-CM | POA: Diagnosis not present

## 2023-06-25 DIAGNOSIS — E538 Deficiency of other specified B group vitamins: Secondary | ICD-10-CM | POA: Diagnosis not present

## 2023-06-25 DIAGNOSIS — E039 Hypothyroidism, unspecified: Secondary | ICD-10-CM

## 2023-06-25 DIAGNOSIS — E1169 Type 2 diabetes mellitus with other specified complication: Secondary | ICD-10-CM | POA: Diagnosis not present

## 2023-06-25 DIAGNOSIS — Z136 Encounter for screening for cardiovascular disorders: Secondary | ICD-10-CM | POA: Diagnosis not present

## 2023-06-25 DIAGNOSIS — N182 Chronic kidney disease, stage 2 (mild): Secondary | ICD-10-CM | POA: Diagnosis not present

## 2023-06-25 DIAGNOSIS — J449 Chronic obstructive pulmonary disease, unspecified: Secondary | ICD-10-CM

## 2023-06-25 DIAGNOSIS — I10 Essential (primary) hypertension: Secondary | ICD-10-CM | POA: Diagnosis not present

## 2023-06-25 DIAGNOSIS — E785 Hyperlipidemia, unspecified: Secondary | ICD-10-CM

## 2023-06-25 DIAGNOSIS — E1122 Type 2 diabetes mellitus with diabetic chronic kidney disease: Secondary | ICD-10-CM

## 2023-06-25 DIAGNOSIS — E663 Overweight: Secondary | ICD-10-CM

## 2023-06-25 DIAGNOSIS — K219 Gastro-esophageal reflux disease without esophagitis: Secondary | ICD-10-CM

## 2023-06-25 DIAGNOSIS — E559 Vitamin D deficiency, unspecified: Secondary | ICD-10-CM | POA: Diagnosis not present

## 2023-06-25 DIAGNOSIS — Z79899 Other long term (current) drug therapy: Secondary | ICD-10-CM

## 2023-06-25 DIAGNOSIS — I700 Atherosclerosis of aorta: Secondary | ICD-10-CM

## 2023-06-25 DIAGNOSIS — Z1389 Encounter for screening for other disorder: Secondary | ICD-10-CM | POA: Diagnosis not present

## 2023-06-25 DIAGNOSIS — Z0001 Encounter for general adult medical examination with abnormal findings: Secondary | ICD-10-CM

## 2023-06-25 DIAGNOSIS — I4819 Other persistent atrial fibrillation: Secondary | ICD-10-CM

## 2023-06-25 DIAGNOSIS — F4321 Adjustment disorder with depressed mood: Secondary | ICD-10-CM

## 2023-06-25 NOTE — Progress Notes (Signed)
CPE  Assessment:   Diagnoses and all orders for this visit:  Annual CPE Due annually  Health maintenance reviewed Healthily lifestyle goals set  Essential hypertension Continue Amiodarone, Amlodipine, Olmesartan,  Discussed DASH (Dietary Approaches to Stop Hypertension) DASH diet is lower in sodium than a typical American diet. Cut back on foods that are high in saturated fat, cholesterol, and trans fats. Eat more whole-grain foods, fish, poultry, and nuts Remain active and exercise as tolerated daily.  Monitor BP at home-Call if greater than 130/80.   Hyperlipidemia associated with type 2 diabetes mellitus (HCC)//Atherosclerosis of aorta (HCC) - per CXR 05/12/2017 Continue Atorvastatin Control blood pressure, cholesterol, glucose, increase exercise.  Continue Atorvastatin Discussed lifestyle modifications. Recommended diet heavy in fruits and veggies, omega 3's. Decrease consumption of animal meats, cheeses, and dairy products. Remain active and exercise as tolerated. Continue to monitor.  Type 2 diabetes mellitus with stage 2 chronic kidney disease, without long-term current use of insulin (HCC) Continue Metformin Controlled with diet and exercise Discussed general issues about diabetes pathophysiology and management. Education: Reviewed 'ABCs' of diabetes management (respective goals in parentheses):  A1C (<7), blood pressure (<130/80), and cholesterol (LDL <70) Dietary recommendations Encouraged aerobic exercise.  Discussed foot care, check daily Yearly retinal exam Dental exam every 6 months Continue to monitor  Vitamin D deficiency Continue supplementation Check and monitor levels  Gastroesophageal reflux disease without esophagitis Continue Omeprazole PRN. No suspected reflux complications (Barret/stricture). Lifestyle modification:  wt loss, avoid meals 2-3h before bedtime. Consider eliminating food triggers:  chocolate, caffeine, EtOH, acid/spicy  food.  CKD stage 2 due to type 2 diabetes mellitus (HCC) Continue ARB, statin Discussed how what you eat and drink can aide in kidney protection. Stay well hydrated. Avoid high salt foods. Avoid NSAIDS. Keep BP and BG well controlled.   Take medications as prescribed. Remain active and exercise as tolerated daily. Maintain weight.  Continue to monitor.  Chronic obstructive pulmonary disease, unspecified COPD type (HCC) Former smoker, COPD per imaging Controlled Continue to monitor  Overweight (BMI 25.0-29.9) Discussed appropriate BMI Diet modification. Physical activity. Encouraged/praised to build confidence.  Medication management All medications discussed and reviewed in full. All questions and concerns regarding medications addressed.    Situational stress/depression Continue wellbutrin daily for mood/energy Stress management techniques discussed, increase water, good sleep hygiene discussed, increase exercise, and increase veggies.   Atrial fibrillation Cardiology following Continue Amiodarone, Apixaban ZIO patch currently in place  Hypothyroidism Continue Levothyroxine. Reminded to take on an empty stomach 30-4mins before food.  Stop any Biotin Supplement 48-72 hours before next TSH level to reduce the risk of falsely low TSH levels. Continue to monitor.    Screening for hematuria or proteinuria Check and monitor Microalbumin / creatinine urine ratio Check and monitor Urinalysis, Routine w reflex microscopic  B12 Deficiency Monitor levels  Orders Placed This Encounter  Procedures   CBC with Differential/Platelet   COMPLETE METABOLIC PANEL WITH GFR   Magnesium   Lipid panel   TSH   Hemoglobin A1c   Insulin, random   VITAMIN D 25 Hydroxy (Vit-D Deficiency, Fractures)   Urinalysis, Routine w reflex microscopic   Microalbumin / creatinine urine ratio   Vitamin B12   EKG 12-Lead   Notify office for further evaluation and treatment, questions or  concerns if any reported s/s fail to improve.   The patient was advised to call back or seek an in-person evaluation if any symptoms worsen or if the condition fails to improve as anticipated.  Further disposition pending results of labs. Discussed med's effects and SE's.    I discussed the assessment and treatment plan with the patient. The patient was provided an opportunity to ask questions and all were answered. The patient agreed with the plan and demonstrated an understanding of the instructions.  Discussed med's effects and SE's. Screening labs and tests as requested with regular follow-up as recommended.  I provided 40 minutes of face-to-face time during this encounter including counseling, chart review, and critical decision making was preformed.  Future Appointments  Date Time Provider Department Center  09/29/2023 10:30 AM Adela Glimpse, NP [facility] None  07/06/2024 11:00 AM Lucky Cowboy, MD [facility] None   Subjective:  Debra Collins is a 87 y.o. female who presents for an annual CPE. She has Essential hypertension; Vitamin D deficiency; Type 2 diabetes mellitus (HCC); Chronic obstructive pulmonary disease (HCC); Overweight (BMI 25.0-29.9); Gastroesophageal reflux disease without esophagitis; Unsteady gait; CKD stage 2 due to type 2 diabetes mellitus (HCC); Hyperlipidemia; Aortic atherosclerosis (HCC) by CXR 05/12/2017; Paresthesia and pain of both upper extremities; Situational depression; Persistent atrial fibrillation (HCC); and Symptomatic bradycardia on their problem list.   Overall she reports feeling well today.  She has no new concerns at this time.  Husband just passed from dementia, was living at [facility].  She feels as though her mood is overall stable.  Adjusting to having a new routine in life.  Continues to have good support with sons.    She had a ER visit on 07/04/22 for symptomatic bradycardia.  She had been referred to Dr. Carolan Clines on 05/23/22  for atrial fibrillation.  She had an echo ordered and plan was for outpatient DCCV after 3 weeks of uninterrupted anticoagulation.  She underwent successful DCCV on 06/13/22 with restoration of sinus rhythm.  She had to be cardioverted 3 times before restoration of sinus rhythm.  When she followed up with Dr. Royann Shivers on 06/20/22 she was noted to be back in atrial fibrillation.  Medical therapy was felt to be the best option in the view of her age.  Plan was to obtain a Myoview and if no ischemia start Flecainide.  Myoview on 06/25/22 was low risk and showed no evidence of ischemia.  Echo on 06/26/22 showed LVEF of 60-65% with normal wall motion and no significant valvular disease.  Given negative Myoview she was started on Flecainide. She presented on 07/04/22 with weakness, ED called and she was noted to be markedly bradycardic with rates in the high 20s.  She was started on Amiodarone 200 mg BID.  She was then scheduled for an outpatient cardioversion on 07/19/22.  She followed up with Dr. Royann Shivers for cardioversion with a dx of AFIB.  She was cardioverted 1 time and EKG showed NSR.  She continues to remain in an out of Afib.  Denies symptoms.   She has remote hx of bil mastectomy in 1985 for severe fibrocystic breast disease.   She uses meloxicam/voltaren gel for joint pains, well controlled.   GERD well controlled with omeprazole after failure of famotidine.   BMI is Body mass index is 25.16 kg/m., she has not been working on diet and exercise, but generally active around home however has been eating lots of sweets that family and friends have dropped off since losing husband. Wt Readings from Last 3 Encounters:  06/25/23 139 lb 12.8 oz (63.4 kg)  04/09/23 138 lb (62.6 kg)  03/24/23 138 lb (62.6 kg)   Her blood pressure has  been controlled at home, today their BP is BP: (!) 156/70   She does not workout. She denies chest pain, shortness of breath, dizziness.   She is a remote former smoker, COPD  per CXR in 2018. Denies cough/productivity, exertional sx. Reports occasional wheeze.   Aortic atherosclerosis per CXR 05/12/2017.    She is on cholesterol medication (atorvastatin 80 mg taking half pill and denies myalgias. Her cholesterol is not at goal. The cholesterol last visit was:    Lab Results  Component Value Date   CHOL 165 03/24/2023   HDL 55 03/24/2023   LDLCALC 82 03/24/2023   TRIG 182 (H) 03/24/2023   CHOLHDL 3.0 03/24/2023    She has been working on diet and exercise for diabetes with CKD (controlled in prediabetes range with meds, didn't like metformin made her feel, just recently increased trulicity to 4.5 mg weekly and tolerating well), she is on bASA, she is on ACE, and denies paresthesia of the feet, polydipsia and polyuria. Last A1C in the office was:  Lab Results  Component Value Date   HGBA1C 6.0 (H) 03/24/2023   Lab Results  Component Value Date   EGFR 68 03/24/2023   EGFR 86 12/05/2022   EGFR 80 07/16/2022   Lab Results  Component Value Date   MICRALBCREAT 14 05/06/2022   She did change her levothyroxine dose, taking 1 tab 50 mcg daily.  Lab Results  Component Value Date   TSH 3.83 03/24/2023   Patient is on Vitamin D supplement - 5000 units daily, but forgets to take   Lab Results  Component Value Date   VD25OH 84 12/05/2022     She takes Vitamin B12 supplement for prevention of B12 deficiency.    Medication Review:  Current Outpatient Medications on File Prior to Visit  Medication Sig Dispense Refill   acetaminophen (TYLENOL) 500 MG tablet Take 1,000 mg by mouth every 6 (six) hours as needed for moderate pain.     allopurinol (ZYLOPRIM) 100 MG tablet Take 1 tablet (100 mg total) by mouth daily. 90 tablet 3   amiodarone (PACERONE) 200 MG tablet Take 1 tablet (200 mg total) by mouth daily. 90 tablet 3   amLODipine (NORVASC) 2.5 MG tablet Take 1 tablet (2.5 mg total) by mouth daily. 30 tablet 11   apixaban (ELIQUIS) 5 MG TABS tablet Take  1  tablet  2 x /day  for AFib & to Prevent Blood Clots     atorvastatin (LIPITOR) 40 MG tablet Take  1 tablet  Daily  for Cholesterol. 90 tablet 3   Brimonidine Tartrate (LUMIFY) 0.025 % SOLN Place 1 drop into both eyes 2 (two) times daily as needed (eye redness/irritation.).     Cholecalciferol (DIALYVITE VITAMIN D 5000) 125 MCG (5000 UT) capsule Take 5,000 Units by mouth daily.     metFORMIN (GLUCOPHAGE-XR) 500 MG 24 hr tablet Take 1 tablet (500 mg total) by mouth 2 (two) times daily with a meal. 180 tablet 6   Multiple Vitamin (MULTIVITAMIN WITH MINERALS) TABS tablet Take 1 tablet by mouth in the morning.     olmesartan (BENICAR) 40 MG tablet Take  1 tablet  Daily for BP                                                                     /  TAKE                                  BY                               MOUTH     omeprazole (PRILOSEC) 20 MG capsule Take  1 capsule  Daily  to Prevent Heartburn & Indigestion                                    /                                TAKE                          BY                       MOUTH 90 capsule 3   vitamin B-12 (CYANOCOBALAMIN) 1000 MCG tablet Take 1,000 mcg by mouth daily.     buPROPion (WELLBUTRIN XL) 150 MG 24 hr tablet Take  1 tablet  Daily  for Mood, Focus & Concentration                               /                                                                   TAKE                                         BY                                                 MOUTH (Patient not taking: Reported on 06/25/2023) 90 tablet 3   No current facility-administered medications on file prior to visit.    Current Problems (verified) Patient Active Problem List   Diagnosis Date Noted   Symptomatic bradycardia 07/04/2022   Persistent atrial fibrillation (HCC) 06/13/2022   Situational depression 10/12/2021   Paresthesia and pain of both upper extremities 01/30/2021   Aortic atherosclerosis (HCC) by CXR 05/12/2017  07/11/2020   Hyperlipidemia 06/22/2019   CKD stage 2 due to type 2 diabetes mellitus (HCC) 03/15/2019   Unsteady gait 03/12/2018   Gastroesophageal reflux disease without esophagitis 03/11/2018   Overweight (BMI 25.0-29.9) 08/11/2017   Chronic obstructive pulmonary disease (HCC) 01/29/2017   Type 2 diabetes mellitus (HCC) 08/16/2014   Essential hypertension 07/25/2013   Vitamin D deficiency 07/25/2013    Screening Tests Immunization History  Administered Date(s) Administered   H1N1 06/28/2008   Influenza Whole 05/04/2013   Influenza, High Dose Seasonal PF 04/26/2014, 04/26/2015, 04/01/2016, 05/07/2017, 04/07/2019,  04/26/2020, 04/14/2022   Influenza-Unspecified 04/14/2018, 04/11/2021, 04/15/2023   PFIZER(Purple Top)SARS-COV-2 Vaccination 07/28/2019, 08/28/2019, 04/16/2020   Pfizer Covid-19 Vaccine Bivalent Booster 60yrs & up 04/04/2021, 04/14/2022   Pneumococcal Conjugate-13 04/26/2014   Pneumococcal Polysaccharide-23 07/10/2011   Rsv, Bivalent, Protein Subunit Rsvpref,pf Verdis Frederickson) 04/14/2022   Td 07/10/2011   Zoster, Live 06/28/2008   Last colonoscopy: 2005 declines another Last mammogram: bilateral mastectomy in 1985 due to severe fibrocystic breast disease, had implants which were also removed.  Updated 11/2021 - NO SPECIFIC MAMMOGRAPHIC EVIDENCE OF MALIGNANCY.   DEXA: 01/2016 - normal  US transvaginal 08/2016  Prior vaccinations: TD or Tdap: 2012 - declines, want to get PRN  Influenza: 04/2023 Pneumococcal: 2012 Prevnar13: 2015 Shingles/Zostavax: 2009  Names of Other Physician/Practitioners you currently use: 1. [facility] here for primary care 2. Dr. Nile Riggs, eye doctor, last visit 2024 no retinopathy  3., Dr. Fanny Dance dentist, last visit 2024, goes q36m  Patient Care Team: Lucky Cowboy, MD as PCP - General (Internal Medicine) Manning Charity, OD (Optometry)  Allergies No Known Allergies   SURGICAL HISTORY She  has a past  surgical history that includes Knee arthroscopy (Left); Appendectomy; Mastectomy (Bilateral, 1985); Cataract extraction, bilateral (Bilateral, 09/2017); Cardioversion (N/A, 06/13/2022); TEMPORARY PACEMAKER (N/A, 07/04/2022); and Cardioversion (N/A, 07/19/2022). FAMILY HISTORY Her family history includes Heart attack in her father and sister; Heart disease in her father and mother; Stroke in her mother. SOCIAL HISTORY She  reports that she quit smoking about 29 years ago. Her smoking use included cigarettes. She has never used smokeless tobacco. She reports that she does not drink alcohol and does not use drugs.  Review of Systems  Constitutional:  Positive for diaphoresis. Negative for chills, fever, malaise/fatigue and weight loss.  HENT:  Negative for congestion, ear pain, hearing loss, sore throat and tinnitus.   Eyes: Negative.  Negative for blurred vision and double vision.  Respiratory:  Negative for cough, sputum production, shortness of breath and wheezing.   Cardiovascular:  Negative for chest pain, palpitations, orthopnea, claudication, leg swelling and PND.  Gastrointestinal:  Negative for abdominal pain, blood in stool, constipation, diarrhea, heartburn, melena, nausea and vomiting.  Genitourinary: Negative.   Musculoskeletal:  Negative for falls, joint pain and myalgias.  Skin: Negative.  Negative for rash.  Neurological:  Negative for dizziness, tingling, sensory change, loss of consciousness, weakness and headaches.  Endo/Heme/Allergies:  Negative for polydipsia.  Psychiatric/Behavioral:  Positive for depression. Negative for memory loss, substance abuse and suicidal ideas. The patient is not nervous/anxious and does not have insomnia.   All other systems reviewed and are negative.    Objective:     Today's Vitals   06/25/23 1008  BP: (!) 156/70  Pulse: 69  Temp: (!) 97.3 F (36.3 C)  SpO2: 99%  Weight: 139 lb 12.8 oz (63.4 kg)  Height: 5' 2.5" (1.588 m)    Body mass  index is 25.16 kg/m.  General appearance: alert, no distress, WD/WN, female HEENT: normocephalic, sclerae anicteric, TMs pearly, nares patent, no discharge or erythema, pharynx normal Oral cavity: MMM, no lesions Neck: supple, no lymphadenopathy, no thyromegaly, no masses Heart: RRR, normal S1, S2, no murmurs ZIO patch Lungs: CTA bilaterally, no wheezes, rhonchi, or rales Abdomen: +bs, soft, non tender, non distended, no masses, no hepatomegaly, no splenomegaly Musculoskeletal: nontender, no swelling, no obvious deformity Extremities: no edema, no cyanosis, no clubbing Pulses: 2+ symmetric, upper and lower extremities, normal cap refill Neurological: alert, oriented x 3, CN2-12 intact, strength normal  upper extremities, LLE mildly weak, sensation normal throughout, DTRs 2+ throughout, no cerebellar signs, gait slow Psychiatric: normal affect, behavior normal, pleasant  Breasts: S/p bil mastectomy, no palpable mass or axillary lymphadenopathy,   Medicare Attestation I have personally reviewed: The patient's medical and social history Their use of alcohol, tobacco or illicit drugs Their current medications and supplements The patient's functional ability including ADLs,fall risks, home safety risks, cognitive, and hearing and visual impairment Diet and physical activities Evidence for depression or mood disorders  EKG:  Atrial Fibrillation   Adela Glimpse, NP   06/25/2023

## 2023-06-25 NOTE — Patient Instructions (Signed)

## 2023-06-26 DIAGNOSIS — K136 Irritative hyperplasia of oral mucosa: Secondary | ICD-10-CM | POA: Diagnosis not present

## 2023-06-26 LAB — CBC WITH DIFFERENTIAL/PLATELET
Absolute Lymphocytes: 1251 {cells}/uL (ref 850–3900)
Absolute Monocytes: 506 {cells}/uL (ref 200–950)
Basophils Absolute: 18 {cells}/uL (ref 0–200)
Basophils Relative: 0.3 *Unknown
Eosinophils Absolute: 79 {cells}/uL (ref 15–500)
Eosinophils Relative: 1.3 *Unknown
HCT: 41.5 *Unknown (ref 35.0–45.0)
Hemoglobin: 13.4 g/dL (ref 11.7–15.5)
MCH: 32.5 pg (ref 27.0–33.0)
MCHC: 32.3 g/dL (ref 32.0–36.0)
MCV: 100.7 fL — ABNORMAL HIGH (ref 80.0–100.0)
MPV: 10.6 fL (ref 7.5–12.5)
Monocytes Relative: 8.3 *Unknown
Neutro Abs: 4246 {cells}/uL (ref 1500–7800)
Neutrophils Relative %: 69.6 *Unknown
Platelets: 192 10*3/uL (ref 140–400)
RBC: 4.12 10*6/uL (ref 3.80–5.10)
RDW: 12.5 *Unknown (ref 11.0–15.0)
Total Lymphocyte: 20.5 *Unknown
WBC: 6.1 10*3/uL (ref 3.8–10.8)

## 2023-06-26 LAB — COMPLETE METABOLIC PANEL WITH GFR
AG Ratio: 2.1 *Unknown (ref 1.0–2.5)
ALT: 18 U/L (ref 6–29)
AST: 19 U/L (ref 10–35)
Albumin: 4.6 g/dL (ref 3.6–5.1)
Alkaline phosphatase (APISO): 38 U/L (ref 37–153)
BUN: 21 mg/dL (ref 7–25)
CO2: 29 mmol/L (ref 20–32)
Calcium: 9.6 mg/dL (ref 8.6–10.4)
Chloride: 103 mmol/L (ref 98–110)
Creat: 0.65 mg/dL (ref 0.60–0.95)
Globulin: 2.2 g/dL (ref 1.9–3.7)
Glucose, Bld: 85 mg/dL (ref 65–99)
Potassium: 4.2 mmol/L (ref 3.5–5.3)
Sodium: 140 mmol/L (ref 135–146)
Total Bilirubin: 0.9 mg/dL (ref 0.2–1.2)
Total Protein: 6.8 g/dL (ref 6.1–8.1)
eGFR: 86 mL/min/{1.73_m2} (ref >=60)

## 2023-06-26 LAB — URINALYSIS, ROUTINE W REFLEX MICROSCOPIC
Bilirubin Urine: NEGATIVE *Unknown
Glucose, UA: NEGATIVE *Unknown
Hgb urine dipstick: NEGATIVE *Unknown
Leukocytes,Ua: NEGATIVE *Unknown
Nitrite: NEGATIVE *Unknown
Protein, ur: NEGATIVE *Unknown
Specific Gravity, Urine: 1.031 *Unknown (ref 1.001–1.035)
pH: <=5 *Unknown (ref 5.0–8.0)

## 2023-06-26 LAB — LIPID PANEL
Cholesterol: 124 mg/dL (ref <200)
HDL: 45 mg/dL — ABNORMAL LOW (ref >=50)
LDL Cholesterol (Calc): 56 mg/dL
Non-HDL Cholesterol (Calc): 79 mg/dL (ref <130)
Total CHOL/HDL Ratio: 2.8 *Unknown (ref <5.0)
Triglycerides: 144 mg/dL (ref <150)

## 2023-06-26 LAB — MICROALBUMIN / CREATININE URINE RATIO
Creatinine, Urine: 106 mg/dL (ref 20–275)
Microalb Creat Ratio: 8 mg/g{creat} (ref <30)
Microalb, Ur: 0.8 mg/dL

## 2023-06-26 LAB — HEMOGLOBIN A1C
Hgb A1c MFr Bld: 5.8 *Unknown — ABNORMAL HIGH (ref <5.7)
Mean Plasma Glucose: 120 mg/dL
eAG (mmol/L): 6.6 mmol/L

## 2023-06-26 LAB — VITAMIN B12: Vitamin B-12: 648 pg/mL (ref 200–1100)

## 2023-06-26 LAB — INSULIN, RANDOM: Insulin: 5.9 u[IU]/mL

## 2023-06-26 LAB — TSH: TSH: 3.08 m[IU]/L (ref 0.40–4.50)

## 2023-06-26 LAB — VITAMIN D 25 HYDROXY (VIT D DEFICIENCY, FRACTURES): Vit D, 25-Hydroxy: 74 ng/mL (ref 30–100)

## 2023-06-26 LAB — MAGNESIUM: Magnesium: 1.9 mg/dL (ref 1.5–2.5)

## 2023-08-07 ENCOUNTER — Other Ambulatory Visit: Payer: Self-pay | Admitting: Internal Medicine

## 2023-08-07 DIAGNOSIS — I10 Essential (primary) hypertension: Secondary | ICD-10-CM

## 2023-09-01 ENCOUNTER — Other Ambulatory Visit: Payer: Self-pay

## 2023-09-01 DIAGNOSIS — E1122 Type 2 diabetes mellitus with diabetic chronic kidney disease: Secondary | ICD-10-CM

## 2023-09-01 MED ORDER — METFORMIN HCL ER 500 MG PO TB24
500.0000 mg | ORAL_TABLET | Freq: Two times a day (BID) | ORAL | 1 refills | Status: AC
Start: 2023-09-01 — End: ?

## 2023-09-29 ENCOUNTER — Ambulatory Visit: Payer: HMO | Admitting: Nurse Practitioner

## 2023-10-03 ENCOUNTER — Other Ambulatory Visit: Payer: Self-pay

## 2023-10-03 MED ORDER — AMIODARONE HCL 200 MG PO TABS: 200.0000 mg | ORAL_TABLET | Freq: Every day | ORAL | 0 refills | Status: DC

## 2023-10-22 ENCOUNTER — Other Ambulatory Visit: Payer: Self-pay | Admitting: Cardiovascular Disease

## 2023-11-09 ENCOUNTER — Other Ambulatory Visit: Payer: Self-pay | Admitting: Cardiovascular Disease

## 2023-11-27 DIAGNOSIS — I10 Essential (primary) hypertension: Secondary | ICD-10-CM | POA: Diagnosis not present

## 2023-11-27 DIAGNOSIS — M109 Gout, unspecified: Secondary | ICD-10-CM | POA: Diagnosis not present

## 2023-11-27 DIAGNOSIS — E119 Type 2 diabetes mellitus without complications: Secondary | ICD-10-CM | POA: Diagnosis not present

## 2023-11-27 DIAGNOSIS — R42 Dizziness and giddiness: Secondary | ICD-10-CM | POA: Diagnosis not present

## 2023-11-27 DIAGNOSIS — I7090 Unspecified atherosclerosis: Secondary | ICD-10-CM | POA: Diagnosis not present

## 2023-11-27 DIAGNOSIS — B3749 Other urogenital candidiasis: Secondary | ICD-10-CM | POA: Diagnosis not present

## 2023-11-27 DIAGNOSIS — F4320 Adjustment disorder, unspecified: Secondary | ICD-10-CM | POA: Diagnosis not present

## 2023-11-27 DIAGNOSIS — M792 Neuralgia and neuritis, unspecified: Secondary | ICD-10-CM | POA: Diagnosis not present

## 2023-11-27 DIAGNOSIS — I4891 Unspecified atrial fibrillation: Secondary | ICD-10-CM | POA: Diagnosis not present

## 2023-12-09 ENCOUNTER — Encounter: Payer: Self-pay | Admitting: Cardiovascular Disease

## 2023-12-09 ENCOUNTER — Ambulatory Visit: Attending: Cardiovascular Disease | Admitting: Cardiovascular Disease

## 2023-12-09 VITALS — BP 128/66 | HR 79 | Ht 61.5 in | Wt 152.6 lb

## 2023-12-09 DIAGNOSIS — E032 Hypothyroidism due to medicaments and other exogenous substances: Secondary | ICD-10-CM | POA: Diagnosis not present

## 2023-12-09 DIAGNOSIS — E119 Type 2 diabetes mellitus without complications: Secondary | ICD-10-CM

## 2023-12-09 DIAGNOSIS — Z79899 Other long term (current) drug therapy: Secondary | ICD-10-CM | POA: Diagnosis not present

## 2023-12-09 DIAGNOSIS — D6869 Other thrombophilia: Secondary | ICD-10-CM

## 2023-12-09 DIAGNOSIS — E7800 Pure hypercholesterolemia, unspecified: Secondary | ICD-10-CM

## 2023-12-09 DIAGNOSIS — Z5181 Encounter for therapeutic drug level monitoring: Secondary | ICD-10-CM

## 2023-12-09 DIAGNOSIS — I4819 Other persistent atrial fibrillation: Secondary | ICD-10-CM

## 2023-12-09 DIAGNOSIS — I10 Essential (primary) hypertension: Secondary | ICD-10-CM

## 2023-12-09 NOTE — Patient Instructions (Signed)
 Medication Instructions:  Your physician recommends that you continue on your current medications as directed. Please refer to the Current Medication list given to you today.  *If you need a refill on your cardiac medications before your next appointment, please call your pharmacy*  Lab Work: TSH If you have labs (blood work) drawn today and your tests are completely normal, you will receive your results only by: MyChart Message (if you have MyChart) OR A paper copy in the mail If you have any lab test that is abnormal or we need to change your treatment, we will call you to review the results.  Testing/Procedures: None ordered.   Follow-Up: At [facility], you and your health needs are our priority.  As part of our continuing mission to provide you with exceptional heart care, our providers are all part of one team.  This team includes your primary Cardiologist (physician) and Advanced Practice Providers or APPs (Physician Assistants and Nurse Practitioners) who all work together to provide you with the care you need, when you need it.  Your next appointment:   12 months with Dr Alvis Ba

## 2023-12-09 NOTE — Progress Notes (Signed)
 Cardiology Office Note:    Date:  12/09/2023   ID:  Debra Collins, DOB 08-19-1935, MRN [id number]  PCP:  Vangie Genet, MD   [facility] Providers Cardiologist:  None     Referring MD: Vangie Genet, MD   No chief complaint on file.   History of Present Illness:    Debra Collins is a 88 y.o. female with recurrent symptomatic persistent atrial fibrillation.    She is doing quite well. The patient specifically denies any chest pain at rest or with exertion, dyspnea at rest or with exertion, orthopnea, paroxysmal nocturnal dyspnea, syncope, palpitations, focal neurological deficits, intermittent claudication, lower extremity edema, unexplained weight gain, cough, hemoptysis or wheezing.  She has not had any falls or bleeding problems while on anticoagulation.  Despite good rate control she felt poorly in atrial fibrillation (fatigue and dyspnea), felt much better after returning to normal rhythm.  She had early recurrence of atrial fibrillation after initial cardioversion, before initiation of antiarrhythmics.  She has maintained normal sinus rhythm for roughly 18 months on amiodarone .  Flecainide  was started on 06/25/2022 and the patient developed symptomatic severe junctional bradycardia with rates in the 20s and was hospitalized on 07/04/2022.  She required temporary transvenous pacing.  Flecainide  and the AV nodal blockers were stopped and she had recurrent atrial fibrillation.  Dofetilide was not considered a good option due to prolonged QT interval.  She was started on amiodarone  200 mg twice daily.  Cardioversion on 07/19/2022 1 amiodarone  was successful.  She has never had a TIA or stroke.  She has a history of diet-controlled diabetes mellitus, treated hypertension and treated hypercholesterolemia, but does not have a history of CAD or PAD.  She had a treadmill stress test at least 30 years ago that was normal, but no subsequent evaluation for CAD.  She does not  smoke.  Her father had a myocardial infarction and died around age 8.  In 12-27-23she had a normal myocardial nuclear perfusion study.  Her echocardiogram shows normal left ventricular systolic function and no serious valvular abnormalities.  Although the report states that both atrial normal in size, on my evaluation of the left atrium appears to be mildly dilated.  LV end-systolic diameter is 3.95 cm and the end-systolic volume index the apical four-chamber view was 37 mL/m.  Her echocardiogram was also significant for mild pulmonary artery hypertension at about 40 mmHg (study was performed during atrial fibrillation and diastolic function could not be assessed).  Metabolic control remains good.  Hemoglobin A1c was 6.3%, down from 6.8% last year.  She is only taking a low-dose of metformin  for glycemic control.  She has normal renal function.  Her most recent LDL cholesterol 78 and HDL 48, although she does have mildly elevated triglycerides at 230.  Past Medical History:  Diagnosis Date   DDD (degenerative disc disease), lumbar    Fatty liver disease, nonalcoholic    CT AB 2012   Fibrocystic breast disease    Hyperlipidemia    Hypertension    Type II or unspecified type diabetes mellitus without mention of complication, not stated as uncontrolled    Vitamin D  deficiency     Past Surgical History:  Procedure Laterality Date   APPENDECTOMY     CARDIOVERSION N/A 06/13/2022   Procedure: CARDIOVERSION;  Surgeon: Sheryle Donning, MD;  Location: [facility] ENDOSCOPY;  Service: Cardiovascular;  Laterality: N/A;   CARDIOVERSION N/A 07/19/2022   Procedure: CARDIOVERSION;  Surgeon: Luana Rumple, MD;  Location:  [facility] ENDOSCOPY;  Service: Cardiovascular;  Laterality: N/A;   CATARACT EXTRACTION, BILATERAL Bilateral 09/2017   Dr. Gennie Kicks   KNEE ARTHROSCOPY Left    MASTECTOMY Bilateral 1985   Due to severe fibrocystic breast disease   TEMPORARY PACEMAKER N/A 07/04/2022   Procedure: TEMPORARY  PACEMAKER;  Surgeon: [place], Peter M, MD;  Location: [facility] INVASIVE CV LAB;  Service: Cardiovascular;  Laterality: N/A;    Current Medications: Current Meds  Medication Sig   acetaminophen  (TYLENOL ) 500 MG tablet Take 1,000 mg by mouth every 6 (six) hours as needed for moderate pain.   allopurinol  (ZYLOPRIM ) 100 MG tablet Take 1 tablet (100 mg total) by mouth daily.   amiodarone  (PACERONE ) 200 MG tablet Take 1 tablet (200 mg total) by mouth daily.   amLODipine  (NORVASC ) 2.5 MG tablet Take 1 tablet (2.5 mg total) by mouth daily.   apixaban  (ELIQUIS ) 5 MG TABS tablet Take  1 tablet  2 x /day  for AFib & to Prevent Blood Clots   atorvastatin  (LIPITOR) 40 MG tablet Take  1 tablet  Daily  for Cholesterol.   Brimonidine Tartrate (LUMIFY) 0.025 % SOLN Place 1 drop into both eyes 2 (two) times daily as needed (eye redness/irritation.).   buPROPion  (WELLBUTRIN  XL) 150 MG 24 hr tablet Take  1 tablet  Daily  for Mood, Focus & Concentration                               /                                                                   TAKE                                         BY                                                 MOUTH   Cholecalciferol (DIALYVITE VITAMIN D  5000) 125 MCG (5000 UT) capsule Take 5,000 Units by mouth daily.   metFORMIN  (GLUCOPHAGE -XR) 500 MG 24 hr tablet Take 1 tablet (500 mg total) by mouth 2 (two) times daily with a meal.   Multiple Vitamin (MULTIVITAMIN WITH MINERALS) TABS tablet Take 1 tablet by mouth in the morning.   nystatin cream (MYCOSTATIN) Apply 1 Application topically 2 (two) times daily.   olmesartan  (BENICAR ) 40 MG tablet TAKE 1 TABLET BY MOUTH DAILY FOR BLOOD PRESSURE   omeprazole  (PRILOSEC) 20 MG capsule Take  1 capsule  Daily  to Prevent Heartburn & Indigestion                                    /                                TAKE  BY                       MOUTH   TRULICITY  0.75 MG/0.5ML SOAJ Inject 0.75 mg into the skin once a week.    vitamin B-12 (CYANOCOBALAMIN ) 1000 MCG tablet Take 1,000 mcg by mouth daily.     Allergies:   Other   Family History: The patient's family history includes Heart attack in her father and sister; Heart disease in her father and mother; Stroke in her mother.  ROS:   Please see the history of present illness.     All other systems reviewed and are negative.  EKGs/Labs/Other Studies Reviewed:    The following studies were reviewed today:   EKG: Personally reviewed ECG from 06/25/2023 which shows sinus rhythm and old right bundle branch block  EKG Interpretation Date/Time:    Ventricular Rate:    PR Interval:    QRS Duration:    QT Interval:    QTC Calculation:   R Axis:      Text Interpretation:            Recent Labs: 06/25/2023: ALT 18; BUN 21; Creat 0.65; Hemoglobin 13.4; Magnesium 1.9; Platelets 192; Potassium 4.2; Sodium 140; TSH 3.08  Recent Lipid Panel    Component Value Date/Time   CHOL 124 06/25/2023 1119   TRIG 144 06/25/2023 1119   HDL 45 (L) 06/25/2023 1119   CHOLHDL 2.8 06/25/2023 1119   VLDL 44 (H) 01/31/2017 1010   LDLCALC 56 06/25/2023 1119     Risk Assessment/Calculations:    CHA2DS2-VASc Score =     This indicates a  % annual risk of stroke. The patient's score is based upon:                 Physical Exam:    VS:  BP 128/66   Pulse 79   Ht 5' 1.5" (1.562 m)   Wt 69.2 kg   SpO2 95%   BMI 28.37 kg/m     Wt Readings from Last 3 Encounters:  12/09/23 69.2 kg  06/25/23 63.4 kg  04/09/23 62.6 kg      General: Alert, oriented x3, no distress, appears well.  Appears younger than stated age Head: no evidence of trauma, PERRL, EOMI, no exophtalmos or lid lag, no myxedema, no xanthelasma; normal ears, nose and oropharynx Neck: normal jugular venous pulsations and no hepatojugular reflux; brisk carotid pulses without delay and no carotid bruits Chest: clear to auscultation, no signs of consolidation by percussion or palpation, normal  fremitus, symmetrical and full respiratory excursions Cardiovascular: normal position and quality of the apical impulse, regular rhythm, normal first and second heart sounds, no murmurs, rubs or gallops Abdomen: no tenderness or distention, no masses by palpation, no abnormal pulsatility or arterial bruits, normal bowel sounds, no hepatosplenomegaly Extremities: no clubbing, cyanosis or edema; 2+ radial, ulnar and brachial pulses bilaterally; 2+ right femoral, posterior tibial and dorsalis pedis pulses; 2+ left femoral, posterior tibial and dorsalis pedis pulses; no subclavian or femoral bruits Neurological: grossly nonfocal Psych: Normal mood and affect   ASSESSMENT:    1. Persistent atrial fibrillation (HCC)   2. Medication management   3. Encounter for monitoring amiodarone  therapy   4. Hypothyroidism due to medication   5. Acquired thrombophilia (HCC)   6. Type 2 diabetes mellitus without complication, without long-term current use of insulin  (HCC)   7. Essential hypertension   8. Hypercholesterolemia     PLAN:    In  order of problems listed above:  AFib: Maintaining sinus rhythm now for almost a year and a half.  Had early recurrence of atrial fibrillation without antiarrhythmic and developed profound symptomatic junctional bradycardia on flecainide .  QTc is too long for dofetilide.  She seems to be doing well currently on amiodarone  antiarrhythmic therapy, without taking any other rate control medications.  So far without serious bradycardia on this current medication hopefully will not require pacemaker implantation for tachycardia-bradycardia syndrome. Amiodarone : Had normal liver function tests earlier this month and thyroid  function tests were normal when checked in December 2024.  Should have a repeat TSH next time she gets any labs done.  Discussed the multiple potential toxicities of this medication, the need for monitoring of liver and thyroid  function, avoidance of sun  exposure, yearly eye exam, need to promptly report unexplained respiratory symptoms, drug interactions, etc. Anticoagulation: No bleeding complications. DM: Well-controlled glucose levels.  Continue metformin . HTN: BP well-controlled on current medications.  Continue amlodipine , olmesartan , avoid beta-blockers. HLP: She does not have known CAD or PAD.  LDL less than 100 is acceptable.  Continue the current dose of atorvastatin .           Medication Adjustments/Labs and Tests Ordered: Current medicines are reviewed at length with the patient today.  Concerns regarding medicines are outlined above.  Orders Placed This Encounter  Procedures   TSH   No orders of the defined types were placed in this encounter.   Patient Instructions  Medication Instructions:  Your physician recommends that you continue on your current medications as directed. Please refer to the Current Medication list given to you today.  *If you need a refill on your cardiac medications before your next appointment, please call your pharmacy*  Lab Work: TSH If you have labs (blood work) drawn today and your tests are completely normal, you will receive your results only by: MyChart Message (if you have MyChart) OR A paper copy in the mail If you have any lab test that is abnormal or we need to change your treatment, we will call you to review the results.  Testing/Procedures: None ordered.   Follow-Up: At [facility], you and your health needs are our priority.  As part of our continuing mission to provide you with exceptional heart care, our providers are all part of one team.  This team includes your primary Cardiologist (physician) and Advanced Practice Providers or APPs (Physician Assistants and Nurse Practitioners) who all work together to provide you with the care you need, when you need it.  Your next appointment:   12 months with Dr Alvis Ba       Signed, Luana Rumple, MD  12/09/2023  6:21 PM    [facility]

## 2023-12-12 ENCOUNTER — Telehealth: Payer: Self-pay | Admitting: Cardiovascular Disease

## 2023-12-12 MED ORDER — AMIODARONE HCL 200 MG PO TABS: 200.0000 mg | ORAL_TABLET | Freq: Every day | ORAL | 3 refills | Status: AC

## 2023-12-12 NOTE — Telephone Encounter (Signed)
*  STAT* If patient is at the pharmacy, call can be transferred to refill team.   1. Which medications need to be refilled? (please list name of each medication and dose if known)   amiodarone  (PACERONE ) 200 MG tablet   2. Would you like to learn more about the convenience, safety, & potential cost savings by using the [facility]?   3. Are you open to using the [facility] (Type [facility]. ).  4. Which pharmacy/location (including street and city if local pharmacy) is medication to be sent to?  [facility] [id number] - [place], [place] - [address]   5. Do they need a 30 day or 90 day supply?   90 day  Patient stated she is completely out of this medication.

## 2023-12-12 NOTE — Telephone Encounter (Signed)
 Pt's medication was sent to pt's pharmacy as requested. Confirmation received.

## 2024-01-30 ENCOUNTER — Other Ambulatory Visit: Payer: Self-pay

## 2024-01-30 MED ORDER — AMLODIPINE BESYLATE 2.5 MG PO TABS: 2.5000 mg | ORAL_TABLET | Freq: Every day | ORAL | 3 refills | Status: AC

## 2024-02-07 ENCOUNTER — Other Ambulatory Visit: Payer: Self-pay | Admitting: Nurse Practitioner

## 2024-02-07 DIAGNOSIS — M79674 Pain in right toe(s): Secondary | ICD-10-CM

## 2024-02-07 DIAGNOSIS — M109 Gout, unspecified: Secondary | ICD-10-CM

## 2024-02-12 ENCOUNTER — Other Ambulatory Visit: Payer: Self-pay | Admitting: Family

## 2024-02-12 DIAGNOSIS — E1122 Type 2 diabetes mellitus with diabetic chronic kidney disease: Secondary | ICD-10-CM

## 2024-02-18 DIAGNOSIS — I4891 Unspecified atrial fibrillation: Secondary | ICD-10-CM | POA: Diagnosis not present

## 2024-02-18 DIAGNOSIS — Z0000 Encounter for general adult medical examination without abnormal findings: Secondary | ICD-10-CM | POA: Diagnosis not present

## 2024-02-18 DIAGNOSIS — I10 Essential (primary) hypertension: Secondary | ICD-10-CM | POA: Diagnosis not present

## 2024-02-18 DIAGNOSIS — Z1331 Encounter for screening for depression: Secondary | ICD-10-CM | POA: Diagnosis not present

## 2024-02-18 DIAGNOSIS — B372 Candidiasis of skin and nail: Secondary | ICD-10-CM | POA: Diagnosis not present

## 2024-02-18 DIAGNOSIS — M792 Neuralgia and neuritis, unspecified: Secondary | ICD-10-CM | POA: Diagnosis not present

## 2024-05-20 DIAGNOSIS — R202 Paresthesia of skin: Secondary | ICD-10-CM | POA: Diagnosis not present

## 2024-05-20 DIAGNOSIS — F4321 Adjustment disorder with depressed mood: Secondary | ICD-10-CM | POA: Diagnosis not present

## 2024-05-20 DIAGNOSIS — E119 Type 2 diabetes mellitus without complications: Secondary | ICD-10-CM | POA: Diagnosis not present

## 2024-05-20 DIAGNOSIS — L659 Nonscarring hair loss, unspecified: Secondary | ICD-10-CM | POA: Diagnosis not present

## 2024-05-20 DIAGNOSIS — R42 Dizziness and giddiness: Secondary | ICD-10-CM | POA: Diagnosis not present

## 2024-07-06 ENCOUNTER — Encounter: Payer: HMO | Admitting: Internal Medicine
